# Patient Record
Sex: Male | Born: 1955 | Race: Black or African American | Hispanic: No | Smoking: Former smoker
Health system: Southern US, Community
[De-identification: ages and names within clinical notes are randomized; demographics above are authoritative.]

## PROBLEM LIST (undated history)

## (undated) DIAGNOSIS — I5189 Other ill-defined heart diseases: Secondary | ICD-10-CM

## (undated) DIAGNOSIS — I4892 Unspecified atrial flutter: Secondary | ICD-10-CM

## (undated) DIAGNOSIS — J449 Chronic obstructive pulmonary disease, unspecified: Secondary | ICD-10-CM

## (undated) DIAGNOSIS — F32A Depression, unspecified: Secondary | ICD-10-CM

## (undated) DIAGNOSIS — H5462 Unqualified visual loss, left eye, normal vision right eye: Secondary | ICD-10-CM

## (undated) DIAGNOSIS — K219 Gastro-esophageal reflux disease without esophagitis: Secondary | ICD-10-CM

## (undated) DIAGNOSIS — F329 Major depressive disorder, single episode, unspecified: Secondary | ICD-10-CM

## (undated) DIAGNOSIS — I2510 Atherosclerotic heart disease of native coronary artery without angina pectoris: Secondary | ICD-10-CM

## (undated) HISTORY — DX: Unqualified visual loss, left eye, normal vision right eye: H54.62

## (undated) HISTORY — DX: Depression, unspecified: F32.A

## (undated) HISTORY — DX: Major depressive disorder, single episode, unspecified: F32.9

## (undated) HISTORY — PX: EYE SURGERY: SHX253

## (undated) HISTORY — DX: Gastro-esophageal reflux disease without esophagitis: K21.9

---

## 2002-06-12 ENCOUNTER — Emergency Department (HOSPITAL_COMMUNITY): Admission: EM | Admit: 2002-06-12 | Discharge: 2002-06-12 | Payer: Self-pay | Admitting: Internal Medicine

## 2003-04-30 ENCOUNTER — Emergency Department (HOSPITAL_COMMUNITY): Admission: EM | Admit: 2003-04-30 | Discharge: 2003-05-01 | Payer: Self-pay | Admitting: *Deleted

## 2003-05-01 ENCOUNTER — Encounter: Payer: Self-pay | Admitting: *Deleted

## 2003-05-16 ENCOUNTER — Encounter: Payer: Self-pay | Admitting: Ophthalmology

## 2003-05-16 ENCOUNTER — Ambulatory Visit (HOSPITAL_COMMUNITY): Admission: RE | Admit: 2003-05-16 | Discharge: 2003-05-17 | Payer: Self-pay | Admitting: Ophthalmology

## 2003-09-05 ENCOUNTER — Emergency Department (HOSPITAL_COMMUNITY): Admission: EM | Admit: 2003-09-05 | Discharge: 2003-09-05 | Payer: Self-pay | Admitting: Emergency Medicine

## 2003-10-31 ENCOUNTER — Emergency Department (HOSPITAL_COMMUNITY): Admission: EM | Admit: 2003-10-31 | Discharge: 2003-10-31 | Payer: Self-pay | Admitting: Internal Medicine

## 2003-12-01 ENCOUNTER — Ambulatory Visit (HOSPITAL_COMMUNITY): Admission: RE | Admit: 2003-12-01 | Discharge: 2003-12-01 | Payer: Self-pay | Admitting: Family Medicine

## 2004-01-06 ENCOUNTER — Emergency Department (HOSPITAL_COMMUNITY): Admission: EM | Admit: 2004-01-06 | Discharge: 2004-01-06 | Payer: Self-pay | Admitting: Emergency Medicine

## 2004-01-23 ENCOUNTER — Emergency Department (HOSPITAL_COMMUNITY): Admission: EM | Admit: 2004-01-23 | Discharge: 2004-01-23 | Payer: Self-pay | Admitting: *Deleted

## 2005-04-04 ENCOUNTER — Ambulatory Visit: Payer: Self-pay | Admitting: Family Medicine

## 2008-01-10 ENCOUNTER — Emergency Department (HOSPITAL_COMMUNITY): Admission: EM | Admit: 2008-01-10 | Discharge: 2008-01-10 | Payer: Self-pay | Admitting: Emergency Medicine

## 2008-02-15 ENCOUNTER — Ambulatory Visit: Payer: Self-pay | Admitting: Family Medicine

## 2008-02-17 DIAGNOSIS — F411 Generalized anxiety disorder: Secondary | ICD-10-CM | POA: Insufficient documentation

## 2008-02-17 DIAGNOSIS — F419 Anxiety disorder, unspecified: Secondary | ICD-10-CM | POA: Insufficient documentation

## 2008-02-17 DIAGNOSIS — K219 Gastro-esophageal reflux disease without esophagitis: Secondary | ICD-10-CM | POA: Insufficient documentation

## 2008-02-17 DIAGNOSIS — R609 Edema, unspecified: Secondary | ICD-10-CM | POA: Insufficient documentation

## 2008-02-17 DIAGNOSIS — J45909 Unspecified asthma, uncomplicated: Secondary | ICD-10-CM | POA: Insufficient documentation

## 2008-02-17 DIAGNOSIS — N400 Enlarged prostate without lower urinary tract symptoms: Secondary | ICD-10-CM | POA: Insufficient documentation

## 2008-02-17 DIAGNOSIS — Z87898 Personal history of other specified conditions: Secondary | ICD-10-CM

## 2008-06-13 ENCOUNTER — Emergency Department (HOSPITAL_COMMUNITY): Admission: EM | Admit: 2008-06-13 | Discharge: 2008-06-13 | Payer: Self-pay | Admitting: Emergency Medicine

## 2008-06-13 ENCOUNTER — Encounter: Payer: Self-pay | Admitting: Family Medicine

## 2008-06-19 ENCOUNTER — Telehealth: Payer: Self-pay | Admitting: Family Medicine

## 2008-06-20 ENCOUNTER — Ambulatory Visit: Payer: Self-pay | Admitting: Family Medicine

## 2008-06-23 ENCOUNTER — Emergency Department (HOSPITAL_COMMUNITY): Admission: EM | Admit: 2008-06-23 | Discharge: 2008-06-23 | Payer: Self-pay | Admitting: Emergency Medicine

## 2008-07-04 ENCOUNTER — Emergency Department (HOSPITAL_COMMUNITY): Admission: EM | Admit: 2008-07-04 | Discharge: 2008-07-04 | Payer: Self-pay | Admitting: Emergency Medicine

## 2008-08-04 ENCOUNTER — Encounter: Payer: Self-pay | Admitting: Family Medicine

## 2008-08-25 ENCOUNTER — Emergency Department: Payer: Self-pay | Admitting: Emergency Medicine

## 2008-09-15 ENCOUNTER — Encounter: Payer: Self-pay | Admitting: Family Medicine

## 2008-09-22 ENCOUNTER — Telehealth: Payer: Self-pay | Admitting: Family Medicine

## 2008-10-04 ENCOUNTER — Ambulatory Visit: Payer: Self-pay | Admitting: Family Medicine

## 2008-10-04 DIAGNOSIS — M25579 Pain in unspecified ankle and joints of unspecified foot: Secondary | ICD-10-CM | POA: Insufficient documentation

## 2008-10-04 DIAGNOSIS — R5383 Other fatigue: Secondary | ICD-10-CM

## 2008-10-04 DIAGNOSIS — R5381 Other malaise: Secondary | ICD-10-CM | POA: Insufficient documentation

## 2008-10-20 ENCOUNTER — Encounter: Payer: Self-pay | Admitting: Family Medicine

## 2008-11-06 ENCOUNTER — Telehealth: Payer: Self-pay | Admitting: Family Medicine

## 2008-11-09 ENCOUNTER — Encounter: Payer: Self-pay | Admitting: Family Medicine

## 2008-11-09 ENCOUNTER — Encounter: Payer: Self-pay | Admitting: Orthopedic Surgery

## 2008-11-16 ENCOUNTER — Inpatient Hospital Stay: Payer: Self-pay | Admitting: Psychiatry

## 2008-11-29 ENCOUNTER — Encounter: Payer: Self-pay | Admitting: Family Medicine

## 2009-01-24 ENCOUNTER — Emergency Department (HOSPITAL_COMMUNITY): Admission: EM | Admit: 2009-01-24 | Discharge: 2009-01-24 | Payer: Self-pay | Admitting: Emergency Medicine

## 2009-02-01 ENCOUNTER — Emergency Department: Payer: Self-pay | Admitting: Emergency Medicine

## 2009-02-20 ENCOUNTER — Emergency Department: Payer: Self-pay | Admitting: Emergency Medicine

## 2009-02-21 ENCOUNTER — Emergency Department: Payer: Self-pay | Admitting: Emergency Medicine

## 2009-05-04 ENCOUNTER — Ambulatory Visit: Payer: Self-pay | Admitting: Family Medicine

## 2009-05-04 DIAGNOSIS — F528 Other sexual dysfunction not due to a substance or known physiological condition: Secondary | ICD-10-CM | POA: Insufficient documentation

## 2009-05-07 DIAGNOSIS — J209 Acute bronchitis, unspecified: Secondary | ICD-10-CM | POA: Insufficient documentation

## 2009-05-07 DIAGNOSIS — J019 Acute sinusitis, unspecified: Secondary | ICD-10-CM | POA: Insufficient documentation

## 2009-05-07 HISTORY — DX: Acute bronchitis, unspecified: J20.9

## 2009-05-15 ENCOUNTER — Inpatient Hospital Stay: Payer: Self-pay | Admitting: Internal Medicine

## 2009-05-15 ENCOUNTER — Encounter: Payer: Self-pay | Admitting: Family Medicine

## 2009-05-18 ENCOUNTER — Encounter: Payer: Self-pay | Admitting: Gastroenterology

## 2009-05-25 ENCOUNTER — Ambulatory Visit: Payer: Self-pay | Admitting: Gastroenterology

## 2009-05-25 ENCOUNTER — Ambulatory Visit (HOSPITAL_COMMUNITY): Admission: RE | Admit: 2009-05-25 | Discharge: 2009-05-25 | Payer: Self-pay | Admitting: Gastroenterology

## 2009-06-25 ENCOUNTER — Encounter: Payer: Self-pay | Admitting: Family Medicine

## 2009-09-04 ENCOUNTER — Encounter: Payer: Self-pay | Admitting: Family Medicine

## 2009-10-22 ENCOUNTER — Ambulatory Visit: Payer: Self-pay | Admitting: Family Medicine

## 2009-10-29 DIAGNOSIS — F17200 Nicotine dependence, unspecified, uncomplicated: Secondary | ICD-10-CM | POA: Insufficient documentation

## 2009-11-09 ENCOUNTER — Telehealth: Payer: Self-pay | Admitting: Family Medicine

## 2010-01-23 ENCOUNTER — Emergency Department: Payer: Self-pay | Admitting: Emergency Medicine

## 2010-05-01 ENCOUNTER — Ambulatory Visit: Payer: Self-pay | Admitting: Family Medicine

## 2010-08-05 ENCOUNTER — Telehealth: Payer: Self-pay | Admitting: Family Medicine

## 2010-09-20 ENCOUNTER — Encounter: Payer: Self-pay | Admitting: Family Medicine

## 2010-11-05 ENCOUNTER — Telehealth: Payer: Self-pay | Admitting: Family Medicine

## 2010-11-12 ENCOUNTER — Telehealth: Payer: Self-pay | Admitting: Family Medicine

## 2010-11-15 ENCOUNTER — Ambulatory Visit: Admit: 2010-11-15 | Payer: Self-pay | Admitting: Family Medicine

## 2010-11-21 ENCOUNTER — Ambulatory Visit
Admission: RE | Admit: 2010-11-21 | Discharge: 2010-11-21 | Payer: Self-pay | Source: Home / Self Care | Attending: Family Medicine | Admitting: Family Medicine
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
## 2010-12-03 NOTE — Letter (Signed)
Summary: MEDICAL RELEASE  MEDICAL RELEASE   Imported By: Lind Guest 08/04/2008 10:57:35  _____________________________________________________________________  External Attachment:    Type:   Image     Comment:   External Document

## 2010-12-03 NOTE — Medication Information (Signed)
Summary: Tax adviser   Imported By: Lind Guest 05/01/2010 14:06:26  _____________________________________________________________________  External Attachment:    Type:   Image     Comment:   External Document

## 2010-12-03 NOTE — Letter (Signed)
Summary: MEDICAL RECORDS RELEASE  MEDICAL RECORDS RELEASE   Imported By: Lind Guest 09/15/2008 10:00:10  _____________________________________________________________________  External Attachment:    Type:   Image     Comment:   External Document

## 2010-12-03 NOTE — Letter (Signed)
[facility] Referral No Show Appt 11/08/2008  [facility]  [address]  [place], [place] [address]   Phone: [phone]  Fax: [phone]    November 09, 2008  Dr. Albina Billet [facility] Attn:  Referrals [address] [place], [place]  [address] Ph [phone] / Fax [phone]   Re:    Bryan Kelley DOB:    07-Dec-1955    The above named patient was a no show for the scheduled appointment:  Date: 11/08/2008 Time: 2:00PM  Thank you for your kind referral.  Please feel free to contact our office if we can be of further service.  Sincerely,   [facility] of Dr. Terrance Mass, MD

## 2010-12-03 NOTE — Miscellaneous (Signed)
Summary: lab order  Clinical Lists Changes  Orders: Added new Test order of T-CBC w/Diff [id number]) - Signed Added new Test order of T- * Misc. Laboratory test [id number]) - Signed  Appended Document: lab order called patient, left message  Appended Document: lab order PATIENT AWARE

## 2010-12-03 NOTE — Progress Notes (Signed)
Summary: sister called  Phone Note Call from Patient   Summary of Call: rose campbell sister called and wants you to call her back said it was an emergency call back at [phone] Initial call taken by: Lind Guest,  November 09, 2009 7:52 AM  Follow-up for Phone Call        needs a letter.  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 mother wants to be power of attourney so she can make sure his bills are paid. states patient has really gone downhilll and this has made things very hard on his mother. mother nameDoristine Counter Umbaugh [phone] Follow-up by: Worthy Keeler LPN,  November 09, 2009 9:21 AM  Additional Follow-up for Phone Call Additional follow up Details #1::        pt will need to have either a psychiatrist statement  to say he is mentally incompetent and needs a POA or he himself has totrequest that his mom be made pOA. i understand that there are probs , but this is what needs to be done Additional Follow-up by: Syliva Overman MD,  November 09, 2009 1:39 PM    Additional Follow-up for Phone Call Additional follow up Details #2::    sister aware Follow-up by: Worthy Keeler LPN,  November 09, 2009 2:55 PM

## 2010-12-03 NOTE — Assessment & Plan Note (Signed)
Summary: follow up - room 2   Vital Signs:  Patient profile:   55 year old male Height:      68 inches Weight:      149 pounds BMI:     22.74 O2 Sat:      100 % on Room air Pulse rate:   55 / minute Resp:     16 per minute BP sitting:   110 / 70  (left arm)  Vitals Entered By: Adella Hare LPN (May 01, 2010 10:41 AM) CC: follow-up visit Is Patient Diabetic? No Pain Assessment Patient in pain? no      Comments did not bring meds to ov   Primary Provider:  Syliva Overman MD  CC:  follow-up visit.  History of Present Illness: Pt is here today for check up and medication refils. Pt has a hx of asthma.  He is using his Symbicort two times a day daily.  He also uses Proventil inhaler as needed.  He states his nebulizer machine recently broke. He has the liquid albuterol for breathing treatments but needs the machine.  He is using his albuterol two times a day.  He states this is less than he has in the past.  No HS awakening.  He is still smoking  Also hx of GERD.  Is taking his Omeprazole daily.  Denies heartburn.  BMs are nl.  Colonoscopy was done July 2010.  Hx of anxiety.  Is using Xanax three times a day as prescribed.  Hx of ED.  Has used Viagra in the past but is unable to afford it.   Allergies: No Known Drug Allergies  Past History:  Past medical history reviewed for relevance to current acute and chronic problems.  Past Medical History: Reviewed history from 06/20/2008 and no changes required. 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 ENT:  Denies earache, nasal congestion, sinus pressure, and sore throat. CV:  Denies chest pain or discomfort and palpitations. Resp:  Complains of cough, shortness of breath, and wheezing; denies sputum productive. GI:   Denies abdominal pain, bloody stools, change in bowel habits, dark tarry stools, indigestion, loss of appetite, nausea, and vomiting. GU:  Complains of erectile dysfunction. Psych:  Complains of anxiety and depression; denies suicidal thoughts/plans and thoughts /plans of harming others.  Physical Exam  General:  Well-developed,well-nourished,in no acute distress; alert,appropriate and cooperative throughout examination Head:  Normocephalic and atraumatic without obvious abnormalities. No apparent alopecia or balding. Ears:  External ear exam shows no significant lesions or deformities.  Otoscopic examination reveals clear canals, tympanic membranes are intact bilaterally without bulging, retraction, inflammation or discharge. Hearing is grossly normal bilaterally. Nose:  External nasal examination shows no deformity or inflammation. Nasal mucosa are pink and moist without lesions or exudates. Mouth:  Oral mucosa and oropharynx without lesions or exudates.  Neck:  No deformities, masses, or tenderness noted. Lungs:  normal respiratory effort and no intercostal retractions.  Wheeze noted bilat.  Pt refused nebulizer treatment in the office stating that his ride was waiting for him. Heart:  Normal rate and regular rhythm. S1 and S2 normal without gallop, murmur, click, rub or other extra sounds. Cervical Nodes:  No lymphadenopathy noted Psych:  Cognition and judgment appear intact. Alert and cooperative with normal attention span and concentration. No apparent delusions, illusions, hallucinations   Impression & Recommendations:  Problem # 1:  ASTHMA (ICD-493.90) Assessment Unchanged Rx written for new NMT machine.  His updated medication list for this problem includes:    Symbicort 80-4.5 Mcg/act Aero (Budesonide-formoterol fumarate) .Marland Kitchen..Marland Kitchen Two puffs two times a day    Proventil Hfa 108 (90 Base) Mcg/act Aers (Albuterol sulfate) .Marland Kitchen..Marland Kitchen Two puffs every six to eight hours as needed  Problem #  2:  ANXIETY (ICD-300.00) Assessment: Comment Only  His updated medication list for this problem includes:    Alprazolam 1 Mg Tabs (Alprazolam) ..... One tab by mouth three times a day  Problem # 3:  GERD (ICD-530.81) Assessment: Improved  His updated medication list for this problem includes:    Omeprazole 20 Mg Cpdr (Omeprazole) ..... One tab by mouth once daily  Problem # 4:  NICOTINE ADDICTION (ICD-305.1) Assessment: Unchanged  Encouraged smoking cessation but pt is not interested in quitting.  Problem # 5:  ERECTILE DYSFUNCTION, NON-ORGANIC (ICD-302.72) Assessment: Comment Only Pt requested samples due to expense.  Advised him that we do not have samples of this medication.  Complete Medication List: 1)  Omeprazole 20 Mg Cpdr (Omeprazole) .... One tab by mouth once daily 2)  Flomax 0.4 Mg Cp24 (Tamsulosin hcl) .... One tab by mouth once daily 3)  Symbicort 80-4.5 Mcg/act Aero (Budesonide-formoterol fumarate) .... Two puffs two times a day 4)  Proventil Hfa 108 (90 Base) Mcg/act Aers (Albuterol sulfate) .... Two puffs every six to eight hours as needed 5)  Alprazolam 1 Mg Tabs (Alprazolam) .... One tab by mouth three times a day  Patient Instructions: 1)  Please schedule a follow-up appointment in 4 months. 2)  Tobacco is very bad for your health and your loved ones! You Should stop smoking!. 3)  Stop Smoking Tips: Choose a Quit date. Cut down before the Quit date. decide what you will do as a substitute when you feel the urge to smoke(gum,toothpick,exercise). 4)  I have written you a prescription to get a new nebulizer machine. 5)  I have refilled your medications for you. Prescriptions: ALPRAZOLAM 1 MG  TABS (ALPRAZOLAM) one tab by mouth three times a day  #90 x 1   Entered and Authorized by:   Esperanza Sheets PA   Signed by:   Esperanza Sheets PA on 05/01/2010   Method used:   Printed then faxed to ...       [facility] (retail)       [address]        [place], [place]  [address]       Ph: [phone]       Fax: [phone]   RxID:   [id number] PROVENTIL HFA 108 (90 BASE) MCG/ACT  AERS (ALBUTEROL SULFATE) two puffs every six to eight hours as needed  #1 x 4   Entered and Authorized by:   Esperanza Sheets PA   Signed by:   Esperanza Sheets PA on 05/01/2010   Method used:   Electronically to        [facility] (retail)       [address]       [place], [place]  [address]       Ph: [phone]       Fax: [phone]   RxID:   [id number] SYMBICORT 80-4.5 MCG/ACT  AERO (BUDESONIDE-FORMOTEROL FUMARATE) two puffs two times a day  #1 x 5   Entered and Authorized by:  Esperanza Sheets PA   Signed by:   Esperanza Sheets PA on 05/01/2010   Method used:   Electronically to        [facility] (retail)       [address]       [place], [place]  [address]       Ph: [phone]       Fax: [phone]   RxID:   [id number] FLOMAX 0.4 MG  CP24 (TAMSULOSIN HCL) one tab by mouth once daily  #30 x 5   Entered and Authorized by:   Esperanza Sheets PA   Signed by:   Esperanza Sheets PA on 05/01/2010   Method used:   Electronically to        [facility] (retail)       [address]       [place], [place]  [address]       Ph: [phone]       Fax: [phone]   RxID:   [id number] OMEPRAZOLE 20 MG  CPDR (OMEPRAZOLE) one tab by mouth once daily  #30 x 5   Entered and Authorized by:   Esperanza Sheets PA   Signed by:   Esperanza Sheets PA on 05/01/2010   Method used:   Electronically to        [facility] (retail)       [address]       [place], [place]  [address]       Ph: [phone]       Fax: [phone]   RxID:   [id number]

## 2010-12-03 NOTE — Assessment & Plan Note (Signed)
Summary: FOLLOW UP   Vital Signs:  Patient profile:   55 year old male Height:      68 inches Weight:      157 pounds BMI:     23.96 O2 Sat:      97 % Pulse rate:   88 / minute Pulse rhythm:   regular Resp:     16 per minute BP sitting:   134 / 90 Cuff size:   large  Vitals Entered By: Everitt Amber (October 22, 2009 2:29 PM) CC: Follow up chronic problems, has been out of meds for almost 2 weeks   Primary Care Karmela Bram:  Syliva Overman MD  CC:  Follow up chronic problems and has been out of meds for almost 2 weeks.  History of Present Illness: pt is in with his sister today, and he is absolutely taken aback and angry, when he clearly states that her reason for accompanying him, is her concern about his persistent drug use. He refusees to consider rehab, states he can stop on his own , and that he is actually slowing down. At several poiunts during the visit he angrily stormed out of the exam room, demanding that i address him and not hissister. He denies any recent fever or chills. Reports having labs and a [facility] since his last visit.   Preventive Screening-Counseling & Management  Alcohol-Tobacco     Smoking Status: current     Smoking Cessation Counseling: yes  Current Medications (verified): 1)  Omeprazole 20 Mg  Cpdr (Omeprazole) .... One Tab By Mouth Once Daily 2)  Flomax 0.4 Mg  Cp24 (Tamsulosin Hcl) .... One Tab By Mouth Once Daily 3)  Symbicort 80-4.5 Mcg/act  Aero (Budesonide-Formoterol Fumarate) .... Two Puffs Two Times A Day 4)  Proventil Hfa 108 (90 Base) Mcg/act  Aers (Albuterol Sulfate) .... Two Puffs Every Six To Eight Hours As Needed 5)  Alprazolam 1 Mg  Tabs (Alprazolam) .... One Tab By Mouth Three Times A Day  Allergies (verified): No Known Drug Allergies  Social History: unemployed separated 4 children Alcohol use-no Drug use-yes Current Smoker  Review of Systems  See HPI General:  Complains of fatigue and weight loss; denies chills and fever. ENT:  Denies hoarseness, nasal congestion, and sinus pressure. CV:  Denies chest pain or discomfort, palpitations, and swelling of feet. Resp:  Denies cough and sputum productive. GI:  Denies abdominal pain, constipation, excessive appetite, nausea, and vomiting blood; reports UTD colonscopy, will send for rept. GU:  Complains of urinary hesitancy; denies dysuria and urinary frequency; responds to flomax. Derm:  Complains of itching; 2 week h/o painful lesions on tongie. Psych:  Complains of anxiety, depression, irritability, and mental problems; denies panic attacks, sense of great danger, suicidal thoughts/plans, thoughts of violence, and unusual visions or sounds; states in the past he has been on a mood stabiliser, but refuses one at this time. Heme:  Denies abnormal bruising and bleeding. Allergy:  Complains of seasonal allergies.  Physical Exam  General:  Well-developed,adequately nourished,in no acute distress;agitated, anxious and verbally abusive to his sibling at times during the exam. HEENT: No facial asymmetry,  EOMI, No sinus tenderness, TM's Clear, oropharynx  pink and moist.   Chest: decreased air entry bilterally CVS: S1, S2, No murmurs, No S3.   Abd: Soft, Nontender.  MS: Adequate ROM spine, hips, shoulders and knees.  Ext: No edema.   CNS: CN 2-12 intact, power tone and sensation  normal throughout.   Skin: Intact, no visible lesions or rashes.  Psych: Good eye contact, normal affect.  Memory intact,    Impression & Recommendations:  Problem # 1:  ERECTILE DYSFUNCTION, NON-ORGANIC (ICD-302.72) Assessment Unchanged  The following medications were removed from the medication list:    Viagra 100 Mg Tabs (Sildenafil citrate) .Marland Kitchen... Take 1 tablet by mouth once a day as needed  Problem # 2:  ASTHMA (ICD-493.90) Assessment: Unchanged  His updated medication list for this problem includes:     Symbicort 80-4.5 Mcg/act Aero (Budesonide-formoterol fumarate) .Marland Kitchen..Marland Kitchen Two puffs two times a day    Proventil Hfa 108 (90 Base) Mcg/act Aers (Albuterol sulfate) .Marland Kitchen..Marland Kitchen Two puffs every six to eight hours as needed  Problem # 3:  ANXIETY (ICD-300.00) Assessment: Deteriorated  His updated medication list for this problem includes:    Alprazolam 1 Mg Tabs (Alprazolam) ..... One tab by mouth three times a day  Problem # 4:  GERD (ICD-530.81) Assessment: Unchanged  His updated medication list for this problem includes:    Omeprazole 20 Mg Cpdr (Omeprazole) ..... One tab by mouth once daily  Problem # 5:  NICOTINE ADDICTION (ICD-305.1) Assessment: Deteriorated  Encouraged smoking cessation and discussed different methods for smoking cessation.   Complete Medication List: 1)  Omeprazole 20 Mg Cpdr (Omeprazole) .... One tab by mouth once daily 2)  Flomax 0.4 Mg Cp24 (Tamsulosin hcl) .... One tab by mouth once daily 3)  Symbicort 80-4.5 Mcg/act Aero (Budesonide-formoterol fumarate) .... Two puffs two times a day 4)  Proventil Hfa 108 (90 Base) Mcg/act Aers (Albuterol sulfate) .... Two puffs every six to eight hours as needed 5)  Alprazolam 1 Mg Tabs (Alprazolam) .... One tab by mouth three times a day 6)  Fluconazole 150 Mg Tabs (Fluconazole) .... Take 1 tablet by mouth once a day  Patient Instructions: 1)  Please schedule a follow-up appointment in 2 months. 2)  Tobacco is very bad for your health and your loved ones! You Should stop smoking!. 3)  Stop Smoking Tips: Choose a Quit date. Cut down before the Quit date. decide what you will do as a substitute when you feel the urge to smoke(gum,toothpick,exercise). Prescriptions: SYMBICORT 80-4.5 MCG/ACT  AERO (BUDESONIDE-FORMOTEROL FUMARATE) two puffs two times a day  #1 x 5   Entered by:   Everitt Amber   Authorized by:   Syliva Overman MD   Signed by:   Everitt Amber on 10/24/2009   Method used:   Electronically to        [facility] (retail)       [address]       [place], [place]  [address]       Ph: [phone]       Fax: [phone]   RxID:   [id number] FLUCONAZOLE 150 MG TABS (FLUCONAZOLE) Take 1 tablet by mouth once a day  #3 x 0   Entered and Authorized by:   Syliva Overman MD   Signed by:   Syliva Overman MD on 10/22/2009   Method used:   Electronically to        [facility] (retail)       [address]       [place], [place]  [address]       Ph: [phone]       Fax: [phone]   RxID:   [id number]  Appended Document: FOLLOW UP I had requested that you send to [facility] for his colonscopy rept and most recent hosp d/c suimmary. I dont see the reports or a request for them scanned, pls send for them  Appended Document: FOLLOW UP this was sent, but will resend

## 2010-12-03 NOTE — Letter (Signed)
Summary: MEDICAL RELEASE  MEDICAL RELEASE   Imported By: Lind Guest 10/20/2008 11:40:01  _____________________________________________________________________  External Attachment:    Type:   Image     Comment:   External Document

## 2010-12-03 NOTE — Progress Notes (Signed)
Summary: COPD DIAGNOSES  COPD DIAGNOSES   Imported By: Lind Guest 10/30/2009 16:38:54  _____________________________________________________________________  External Attachment:    Type:   Image     Comment:   External Document

## 2010-12-03 NOTE — Progress Notes (Signed)
Summary: papers  Phone Note Call from Patient   Summary of Call: waiting for papers to be sent to ss. wants to know what is taking so long [phone] Initial call taken by: Rudene Anda,  November 06, 2008 2:37 PM  Follow-up for Phone Call        once he signed for them smart should have sent them , pls check into this , and ensure they get sent this week, let me know if there is a prob, also let pt know what's going on Follow-up by: Syliva Overman MD,  November 06, 2008 4:04 PM  Additional Follow-up for Phone Call Additional follow up Details #1::        explained to patient about SS papers. And he was going to call and have them to send to Dr. Lodema Hong. Additional Follow-up by: Rudene Anda,  November 07, 2008 8:54 AM
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
## 2010-12-03 NOTE — Letter (Signed)
Summary: MEDICAL RELEASE  MEDICAL RELEASE   Imported By: Lind Guest 11/09/2008 14:34:04  _____________________________________________________________________  External Attachment:    Type:   Image     Comment:   External Document

## 2010-12-03 NOTE — Letter (Signed)
Summary: TCS ORDER  TCS ORDER   Imported By: Ave Filter 05/18/2009 10:36:57  _____________________________________________________________________  External Attachment:    Type:   Image     Comment:   External Document

## 2010-12-03 NOTE — Progress Notes (Signed)
  Phone Note Call from Patient   Summary of Call: Brought a paper in here Monday and gave it to [place], it was to send medical [facility] [place] by today.  [phone]  Follow-up for Phone Call        Lucio Edward talked to him and told him that it takes a few days to get the information to where it needs to go. It has been done in the computer Follow-up by: Everitt Amber,  September 22, 2008 4:33 PM

## 2010-12-03 NOTE — Letter (Signed)
Summary: 1st missed appt  1st missed appt   Imported By: Lind Guest 09/20/2010 14:54:40  _____________________________________________________________________  External Attachment:    Type:   Image     Comment:   External Document

## 2010-12-03 NOTE — Progress Notes (Signed)
Summary: MEDICINE  Phone Note Call from Patient   Summary of Call: NEEDS HIS XAN. SEND TO [facility]   IN [place]  # [phone] TO REACH HIN CALL AT [phone] Initial call taken by: Lind Guest,  August 05, 2010 8:25 AM  Follow-up for Phone Call        Rx Called In Follow-up by: Adella Hare LPN,  August 05, 2010 9:33 AM    Prescriptions: ALPRAZOLAM 1 MG  TABS (ALPRAZOLAM) one tab by mouth three times a day  #90 x 0   Entered by:   Adella Hare LPN   Authorized by:   Syliva Overman MD   Signed by:   Adella Hare LPN on 47/82/9562   Method used:   Printed then faxed to ...       [facility] (retail)       [address]       [place], [place]  [address]       Ph: [phone]       Fax: [phone]   RxID:   [id number] ALPRAZOLAM 1 MG  TABS (ALPRAZOLAM) one tab by mouth three times a day  #90 x 0   Entered and Authorized by:   Adella Hare LPN   Signed by:   Adella Hare LPN on 59/56/3875   Method used:   Printed then faxed to ...       [facility] (retail)       [address]       [place], [place]  [address]       Ph: [phone]       Fax: [phone]   RxID:   [id number]  voided

## 2010-12-03 NOTE — Consult Note (Signed)
Summary: DISABILITY DETERMINATION SERVICES  DISABILITY DETERMINATION SERVICES   Imported By: Lind Guest 06/28/2008 13:00:35  _____________________________________________________________________  External Attachment:    Type:   Image     Comment:   External Document

## 2010-12-03 NOTE — Progress Notes (Signed)
Summary: denied xanax refill  Phone Note Outgoing Call   Summary of Call: called patient to inform him that he needed an ov before his alprazolam could be refilled. number disconnected. Rx denied.elizabeth sergent  Initial call taken by: Arbutus Ped,  June 19, 2008 1:38 PM

## 2010-12-05 NOTE — Progress Notes (Signed)
Summary: MEDICATION  Phone Note Call from Patient   Caller: Patient Summary of Call:  PATIENT CAME IN THOUGHT HIS APPOINTMENT WAS TODAY BUT IT WAS FRIDAY. PATIENT CAME IN TO MOVE APPT FOR ANOTHER DAY,HE STATES HE NEEDS INHALER REFILL AND ZANAX  NURSE SENT REFILL ON INHALER.  BEFORE I SPOKE WITH THE NURSE PATIENT "STATES HE NEEDS THE INHALER BECAUSE HE WOULD HATE TO DIE AND HAVE [place] SUED BECAUSE HE NEEDED IT FOR HIS COPD.  PATIENT WAS VERY RUDE WANTED TO GET PRESRCIPTION FOR MEDICTION ADVISED HIM THAT ZANAX WILL NOT BE REFILLED UNTIL HE COME IN FOR APPT. ... HE SAID HE COULDN'T KEEP COMING HERE FROM [place] AND [place] NOT SEE HIM.  I EXPLAINED AGAIN THAT WE HAD HIM DOWN FOR THE 13TH AND THAT THERE WAS NO APPT. SPOTS AVAILABLE FOR TODAY. Initial call taken by: Eugenio Hoes,  November 12, 2010 11:25 AM  Follow-up for Phone Call        noted and agree Follow-up by: Syliva Overman MD,  November 12, 2010 5:03 PM

## 2010-12-05 NOTE — Progress Notes (Signed)
Summary: refill on meds  Phone Note Call from Patient   Summary of Call: patient thought that appointment was today advise patient that appoint is friday he states that he is out of medication could her get his asthma and zinic mediction until friday  Initial call taken by: Eugenio Hoes,  November 12, 2010 11:02 AM  Follow-up for Phone Call        [facility] AND SYMBICORT WAS SENT TO PHARMACY LAST WEEK, THERE WAS ENOUGH SENT TO LAST UNTIL SCHEDULED APPT, PATIENT IS AWARE Follow-up by: Adella Hare LPN,  November 12, 2010 11:27 AM

## 2010-12-05 NOTE — Progress Notes (Signed)
  Phone Note From Pharmacy   Caller: [facility] Summary of Call: requesting refills, however, patient has missed three appts and has not been seen since june, when patient requested meds last month pharmacy was told no further refills without appt. and no appt was made, med was filled today to last until appt that was scheduled by office manager for 11/15/09 at 10am which was first available, called patient and left message for him to return my call Initial call taken by: Adella Hare LPN,  November 05, 2010 4:02 PM

## 2010-12-08 DIAGNOSIS — B370 Candidal stomatitis: Secondary | ICD-10-CM | POA: Insufficient documentation

## 2010-12-19 NOTE — Assessment & Plan Note (Signed)
Summary: FOLLOW UP/SLJ   Vital Signs:  Patient profile:   55 year old male Height:      68 inches Weight:      153 pounds BMI:     23.35 O2 Sat:      95 % Pulse rate:   62 / minute Pulse rhythm:   regular Resp:     16 per minute BP sitting:   140 / 88  (left arm) Cuff size:   regular  Vitals Entered By: Everitt Amber LPN (November 21, 2010 10:54 AM) CC: Follow up chronic problems   Primary Care Provider:  Syliva Overman MD  CC:  Follow up chronic problems.  History of Present Illness: Reports  that he is doing fairly well. Denies recent fever or chills. Denies sinus pressure, nasal congestion , ear pain or sore throat. Denies chest congestion, or cough productive of sputum.Has been wheezing recently. Denies chest pain, palpitations, PND, orthopnea or leg swelling. Denies abdominal pain, nausea, vomitting, diarrhea or constipation. Denies change in bowel movements or bloody stool. Denies dysuria , frequency, incontinence or hesitancy. Denies  joint pain, swelling, or reduced mobility. Denies headaches, vertigo, seizures. Denies depression,has chronic  anxiety with mild  insomnia. Denies  rash, lesions, or itch.     Preventive Screening-Counseling & Management  Alcohol-Tobacco     Smoking Cessation Counseling: yes  Current Medications (verified): 1)  Omeprazole 20 Mg  Cpdr (Omeprazole) .... One Tab By Mouth Once Daily 2)  Flomax 0.4 Mg  Cp24 (Tamsulosin Hcl) .... One Tab By Mouth Once Daily 3)  Symbicort 80-4.5 Mcg/act  Aero (Budesonide-Formoterol Fumarate) .... Two Puffs Two Times A Day 4)  Alprazolam 1 Mg  Tabs (Alprazolam) .... One Tab By Mouth Three Times A Day  Allergies (verified): No Known Drug Allergies  Review of Systems      See HPI General:  Complains of fatigue. Eyes:  Complains of vision loss-both eyes; denies discharge. Derm:  Complains of lesion(s); oral thrush. Endo:  Denies cold intolerance, excessive hunger, excessive thirst, excessive  urination, and heat intolerance. Heme:  Denies abnormal bruising and bleeding. Allergy:  Denies hives or rash and itching eyes.  Physical Exam  General:  Well-developed,well-nourished,in no acute distress; alert,appropriate and cooperative throughout examination HEENT: No facial asymmetry,  EOMI, No sinus tenderness, TM's Clear, oropharynx  pink and moist.candidiasis of the tongue  Chest: Clear to auscultation bilaterally. decreased air entry bilaterally CVS: S1, S2, No murmurs, No S3.   Abd: Soft, Nontender.  MS: Adequate ROM spine, hips, shoulders and knees.  Ext: No edema.   CNS: CN 2-12 intact, power tone and sensation normal throughout.   Skin: Intact, no visible lesions or rashes.  Psych: Good eye contact, normal affect.  Memory intact, not anxious or depressed appearing.    Impression & Recommendations:  Problem # 1:  NICOTINE ADDICTION (ICD-305.1) Assessment Unchanged  Encouraged smoking cessation and discussed different methods for smoking cessation.   Problem # 2:  ANXIETY (ICD-300.00) Assessment: Unchanged  His updated medication list for this problem includes:    Alprazolam 1 Mg Tabs (Alprazolam) ..... One tab by mouth three times a day  Problem # 3:  ASTHMA (ICD-493.90) Assessment: Unchanged  The following medications were removed from the medication list:    Proventil Hfa 108 (90 Base) Mcg/act Aers (Albuterol sulfate) .Marland Kitchen..Marland Kitchen Two puffs every six to eight hours as needed His updated medication list for this problem includes:    Symbicort 80-4.5 Mcg/act Aero (Budesonide-formoterol fumarate) .Marland Kitchen..Marland Kitchen Two puffs two  times a day    Proventil Hfa 108 (90 Base) Mcg/act Aers (Albuterol sulfate) .Marland Kitchen... 2 puffs every 6 to 8 hours as needed for wheezing  Problem # 4:  GERD (ICD-530.81) Assessment: Unchanged  His updated medication list for this problem includes:    Omeprazole 20 Mg Cpdr (Omeprazole) ..... One tab by mouth once daily  Problem # 5:  CANDIDIASIS OF MOUTH  (ICD-112.0) Assessment: Comment Only dukes mouthwash  Complete Medication List: 1)  Omeprazole 20 Mg Cpdr (Omeprazole) .... One tab by mouth once daily 2)  Symbicort 80-4.5 Mcg/act Aero (Budesonide-formoterol fumarate) .... Two puffs two times a day 3)  Alprazolam 1 Mg Tabs (Alprazolam) .... One tab by mouth three times a day 4)  Proventil Hfa 108 (90 Base) Mcg/act Aers (Albuterol sulfate) .... 2 puffs every 6 to 8 hours as needed for wheezing 5)  First-dukes Mouthwash Susp (Diphenhyd-hydrocort-nystatin) .... One teaspoon 3 times daily gargle swish and swallow  Other Orders: T-Basic Metabolic Panel [phone]) T-Lipid Profile [id number]) T-CBC w/Diff 450 [id number]) T-PSA [id number]) T-TSH [phone])  Patient Instructions: 1)  Please schedule a follow-up appointment in 4 months. 2)  Tobacco is very bad for your health and your loved ones! You Should stop smoking!. 3)  Stop Smoking Tips: Choose a Quit date. Cut down before the Quit date. decide what you will do as a substitute when you feel the urge to smoke(gum,toothpick,exercise). 4)  It is not healthy  for men to drink more than 2-3 drinks per day or for women to drink more than 1-2 drinks per day. 5)  BMP prior to visit, ICD-9: 6)  Lipid Panel prior to visit, ICD-9:   fasting in 4 months 7)  CBC w/ Diff prior to visit, ICD-9: 8)  PSA prior to visit, ICD-9: 9)  tsh 10)  med sent in for thrush Prescriptions: SYMBICORT 80-4.5 MCG/ACT  AERO (BUDESONIDE-FORMOTEROL FUMARATE) two puffs two times a day  #1 x 1   Entered by:   Everitt Amber LPN   Authorized by:   Syliva Overman MD   Signed by:   Everitt Amber LPN on 56/38/7564   Method used:   Electronically to        [facility] (retail)       [address]       [place], [place]  [address]       Ph: [phone]       Fax: [phone]   RxID:   [id number] ALPRAZOLAM 1 MG  TABS (ALPRAZOLAM) one tab by mouth three times a day  #90 x  3   Entered by:   Adella Hare LPN   Authorized by:   Syliva Overman MD   Signed by:   Adella Hare LPN on 76/28/3151   Method used:   Printed then faxed to ...       [facility] (retail)       [address]       [place], [place]  [address]       Ph: [phone]       Fax: [phone]   RxID:   [id number] FIRST-DUKES MOUTHWASH  SUSP (DIPHENHYD-HYDROCORT-NYSTATIN) one teaspoon 3 times daily gargle swish and swallow  #150cc x 0   Entered and Authorized by:   Syliva Overman MD   Signed by:   Syliva Overman MD on 11/21/2010   Method used:   Electronically to  [facility] (retail)       [address]       [place], [place]  [address]       Ph: [phone]       Fax: [phone]   RxID:   [id number] PROVENTIL HFA 108 ([phone] BASE) MCG/ACT AERS (ALBUTEROL SULFATE) 2 puffs every 6 to 8 hours as needed for wheezing  #1 x 3   Entered and Authorized by:   Syliva Overman MD   Signed by:   Syliva Overman MD on 11/21/2010   Method used:   Electronically to        [facility] (retail)       [address]       [place], [place]  [address]       Ph: [phone]       Fax: [phone]   RxID:   [id number]    Orders Added: 1)  Est. Patient Level IV [93235] 2)  T-Basic Metabolic Panel [[id number]] 3)  T-Lipid Profile [[id number]] 4)  T-CBC w/Diff [[id number]] 5)  T-PSA [[id number]] 6)  T-TSH [[id number]]

## 2011-02-13 LAB — DIFFERENTIAL
Basophils Absolute: 0.1 10*3/uL (ref 0.0–0.1)
Basophils Relative: 1 % (ref 0–1)
Eosinophils Absolute: 0.8 10*3/uL — ABNORMAL HIGH (ref 0.0–0.7)
Eosinophils Relative: 12 % — ABNORMAL HIGH (ref 0–5)
Lymphocytes Relative: 39 % (ref 12–46)
Lymphs Abs: 2.7 10*3/uL (ref 0.7–4.0)
Monocytes Absolute: 0.4 10*3/uL (ref 0.1–1.0)
Monocytes Relative: 6 % (ref 3–12)
Neutro Abs: 2.9 10*3/uL (ref 1.7–7.7)
Neutrophils Relative %: 42 % — ABNORMAL LOW (ref 43–77)

## 2011-02-13 LAB — POCT I-STAT, CHEM 8
BUN: 13 mg/dL (ref 6–23)
Calcium, Ion: 1.16 mmol/L (ref 1.12–1.32)
Chloride: 106 mEq/L (ref 96–112)
Creatinine, Ser: 1.2 mg/dL (ref 0.4–1.5)
Glucose, Bld: 99 mg/dL (ref 70–99)
HCT: 46 % (ref 39.0–52.0)
Hemoglobin: 15.6 g/dL (ref 13.0–17.0)
Potassium: 3.8 mEq/L (ref 3.5–5.1)
Sodium: 141 mEq/L (ref 135–145)
TCO2: 26 mmol/L (ref 0–100)

## 2011-02-13 LAB — CBC
HCT: 43.8 % (ref 39.0–52.0)
Hemoglobin: 14.8 g/dL (ref 13.0–17.0)
MCHC: 33.7 g/dL (ref 30.0–36.0)
MCV: 91.5 fL (ref 78.0–100.0)
Platelets: 179 10*3/uL (ref 150–400)
RBC: 4.79 MIL/uL (ref 4.22–5.81)
RDW: 13.4 % (ref 11.5–15.5)
WBC: 6.8 10*3/uL (ref 4.0–10.5)

## 2011-03-18 NOTE — Op Note (Signed)
NAME:  Bryan Kelley, Bryan Kelley                 ACCOUNT NO.:  [id number]   MEDICAL RECORD NO.:  [id number]          PATIENT TYPE:  AMB   LOCATION:  DAY                           FACILITY:  [facility]   PHYSICIAN:  Kassie Mends, M.D.      DATE OF BIRTH:  [birth year]   DATE OF PROCEDURE:  05/25/2009  DATE OF DISCHARGE:                               OPERATIVE REPORT   REFERRING PHYSICIAN:  Dr. Lodema Hong.   PROCEDURE:  Colonoscopy.   INDICATION FOR EXAM:  Bryan Kelley is a 55 year old male who presents for  average risk colon cancer screening.   FINDINGS:  Normal colon without evidence of polyps, masses, inflammatory  changes, or diverticular AVMs.  Very small internal hemorrhoids.   RECOMMENDATIONS:  1. Screening colonoscopy in 10 years.  2. He was given a handout on high-fiber diet and hemorrhoids.   MEDICATIONS:  1. Demerol 100 mg IV.  2. Versed 5 mg IV.   PROCEDURE TECHNIQUE:  Physical exam was performed.  Informed consent was  obtained from the patient explaining benefits, risks, and alternatives  to the procedure.  The patient was connected to monitor and placed in  left lateral position.  Continuous oxygen was provided by nasal cannula  and IV medicine administered through an indwelling cannula.  After  administration of sedation and rectal exam, the patient's rectum was  intubated and the scope was advanced under direct visualization to the  cecum.  Scope was removed slowly by careful examining the color,  texture, anatomy, and integrity mucosa on the way out.  The patient was  recovered in endoscopy and discharged home in satisfactory condition.      Kassie Mends, M.D.  Electronically Signed     SM/MEDQ  D:  05/25/2009  T:  05/25/2009  Job:  [id number]   cc:   Milus Mallick. Lodema Hong, M.D.  Fax: [phone]

## 2011-03-21 NOTE — Op Note (Signed)
NAME:  Bryan Kelley, Bryan Kelley                             ACCOUNT NO.:  [id number]   MEDICAL RECORD NO.:  [id number]                   PATIENT TYPE:  OIB   LOCATION:  5731                                 FACILITY:  [facility]   PHYSICIAN:  Beulah Gandy. Ashley Royalty, M.D.              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   PROCEDURE:  Pars plana vitrectomy left eye, scleral buckle left eye,  gas/fluid exchange left eye, retinal photocoagulation left eye.   SURGEON:  Beulah Gandy. Ashley Royalty, M.D.   ASSISTANT:  Bryan Lemma. Lundquist, P.A. and Merian Capron, M.A.   ANESTHESIA:  General.   DESCRIPTION OF PROCEDURE:  Usual prep and drape.  360 degree limbal  peritomy. Isolation of four rectus muscles on 2-0 silk.  Localization of  break in the lower temporal quadrant.  Scleral dissection for 360 degrees to  admit a #279 intrascleral implant.  Diathermy placed in the bed.  The 279  implant was placed and eight scleral sutures were placed in the scleral  flaps to close over the implant.  The 240 band was placed around the eye  with a 270 sleeve at 10 o'clock.  The perforation site was chosen at 2  o'clock in the posterior aspect of the bed.  A large amount of clear,  colorless, subretinal fluid came forth with the perforation.  Once all of  the fluid was finished, the buckle was placed and the scleral flaps were  closed.  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  The attention was carried to the Pars  plana area where the 6 mm infusion port was anchored into place at 4  o'clock. The lighted pick and the cutter was placed at 10 and 2 o'clock,  respectively.  Amvisc was placed on the corneal surface.  The Biome viewing  system was moved into place.  The Pars plana vitrectomy was begun in the  pupillary axis with vitreous membranes being removed from this area. The  vitrectomy was carried down to the macular surface and the disk surface and  then out to the far periphery where the vitreous base was trimmed for 360  degrees.  Perfluoron was then injected over the optic nerve to reattach the  retina.  Once the vitreous cavity contained total Perfluoron, the  instruments were removed from the eye and the indirect ophthalmoscope laser  was moved into place.  294 burns were placed on the scleral buckle around  the retinal periphery with a power of 600 milliwatts 1000 microns each and  0.1 seconds each.  Once this was accomplished, the Perfluoron was exchanged  for Perfluoropropane 12%.  This was a direct exchange. The instruments were  removed from the eye and  9-0 nylon was used to close the sclerotomy sites.  The buckle was adjusted and trimmed.  The band was adjusted and trimmed. The  sutures were knotted and trimmed. The conjunctiva was reposited with 7-0  chromic suture.  Polymixin and gentamicin were irrigated into tenon's space.  Atropine solution was applied.  Marcaine was injected around the globe for  postoperative pain.  Decadron 10 mg was injected into the lower  subconjunctival space.  The closing tension was 10 with a [place]  tonometer.  Complications were none.  Duration was two hours. The patient  was awakened and taken to the recovery room in satisfactory condition.                                               Beulah Gandy. Ashley Royalty, M.D.   JDM/MEDQ  D:  05/16/2003  T:  05/16/2003  Job:  [id number]

## 2011-03-27 ENCOUNTER — Other Ambulatory Visit: Payer: Self-pay

## 2011-03-27 MED ORDER — ALPRAZOLAM 1 MG PO TABS: 1.0000 mg | ORAL_TABLET | Freq: Three times a day (TID) | ORAL | Status: AC | PRN

## 2011-04-01 ENCOUNTER — Ambulatory Visit: Payer: Self-pay | Admitting: Family Medicine

## 2011-05-28 ENCOUNTER — Ambulatory Visit: Payer: Self-pay | Admitting: Family Medicine

## 2011-05-28 ENCOUNTER — Encounter: Payer: Self-pay | Admitting: Family Medicine

## 2011-07-09 ENCOUNTER — Ambulatory Visit: Payer: Self-pay | Admitting: Family Medicine

## 2011-07-09 ENCOUNTER — Encounter: Payer: Self-pay | Admitting: Family Medicine

## 2011-07-09 ENCOUNTER — Telehealth: Payer: Self-pay | Admitting: Family Medicine
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
## 2011-07-09 NOTE — Telephone Encounter (Signed)
Patient has not been seen since [place]

## 2011-07-09 NOTE — Telephone Encounter (Signed)
Called patient, no option to leave message Need to know what med needs to be filled

## 2011-07-10 NOTE — Telephone Encounter (Signed)
Called patient, left message.

## 2011-07-25 ENCOUNTER — Encounter: Payer: Self-pay | Admitting: Family Medicine

## 2011-07-28 ENCOUNTER — Other Ambulatory Visit: Payer: Self-pay

## 2011-07-28 ENCOUNTER — Ambulatory Visit (INDEPENDENT_AMBULATORY_CARE_PROVIDER_SITE_OTHER): Payer: Medicaid Other | Admitting: Family Medicine

## 2011-07-28 ENCOUNTER — Encounter: Payer: Self-pay | Admitting: Family Medicine

## 2011-07-28 VITALS — BP 140/90 | HR 70 | Resp 16 | Ht 67.0 in | Wt 166.1 lb

## 2011-07-28 DIAGNOSIS — F528 Other sexual dysfunction not due to a substance or known physiological condition: Secondary | ICD-10-CM

## 2011-07-28 DIAGNOSIS — R5383 Other fatigue: Secondary | ICD-10-CM

## 2011-07-28 DIAGNOSIS — Z125 Encounter for screening for malignant neoplasm of prostate: Secondary | ICD-10-CM

## 2011-07-28 DIAGNOSIS — Z13220 Encounter for screening for lipoid disorders: Secondary | ICD-10-CM

## 2011-07-28 DIAGNOSIS — F17200 Nicotine dependence, unspecified, uncomplicated: Secondary | ICD-10-CM

## 2011-07-28 DIAGNOSIS — F419 Anxiety disorder, unspecified: Secondary | ICD-10-CM

## 2011-07-28 DIAGNOSIS — R5381 Other malaise: Secondary | ICD-10-CM

## 2011-07-28 DIAGNOSIS — J45909 Unspecified asthma, uncomplicated: Secondary | ICD-10-CM

## 2011-07-28 DIAGNOSIS — F411 Generalized anxiety disorder: Secondary | ICD-10-CM

## 2011-07-28 DIAGNOSIS — K219 Gastro-esophageal reflux disease without esophagitis: Secondary | ICD-10-CM

## 2011-07-28 DIAGNOSIS — N529 Male erectile dysfunction, unspecified: Secondary | ICD-10-CM | POA: Insufficient documentation

## 2011-07-28 LAB — CBC WITH DIFFERENTIAL/PLATELET
Basophils Absolute: 0 10*3/uL (ref 0.0–0.1)
Basophils Relative: 0 % (ref 0–1)
Eosinophils Absolute: 0.3 10*3/uL (ref 0.0–0.7)
Eosinophils Relative: 4 % (ref 0–5)
HCT: 44.5 % (ref 39.0–52.0)
Hemoglobin: 14.9 g/dL (ref 13.0–17.0)
Lymphocytes Relative: 43 % (ref 12–46)
Lymphs Abs: 3.3 10*3/uL (ref 0.7–4.0)
MCH: 29.9 pg (ref 26.0–34.0)
MCHC: 33.5 g/dL (ref 30.0–36.0)
MCV: 89.4 fL (ref 78.0–100.0)
Monocytes Absolute: 0.5 10*3/uL (ref 0.1–1.0)
Monocytes Relative: 6 % (ref 3–12)
Neutro Abs: 3.5 10*3/uL (ref 1.7–7.7)
Neutrophils Relative %: 47 % (ref 43–77)
Platelets: 264 10*3/uL (ref 150–400)
RBC: 4.98 MIL/uL (ref 4.22–5.81)
RDW: 14 % (ref 11.5–15.5)
WBC: 7.6 10*3/uL (ref 4.0–10.5)

## 2011-07-28 LAB — BASIC METABOLIC PANEL
BUN: 18 mg/dL (ref 6–23)
CO2: 26 mEq/L (ref 19–32)
Calcium: 9.9 mg/dL (ref 8.4–10.5)
Chloride: 103 mEq/L (ref 96–112)
Creat: 1.03 mg/dL (ref 0.50–1.35)
Glucose, Bld: 92 mg/dL (ref 70–99)
Potassium: 4.4 mEq/L (ref 3.5–5.3)
Sodium: 140 mEq/L (ref 135–145)

## 2011-07-28 LAB — PSA: PSA: 0.71 ng/mL (ref <=4.00)

## 2011-07-28 LAB — STREP A DNA PROBE: Group A Strep Probe: NEGATIVE

## 2011-07-28 LAB — LIPID PANEL
Cholesterol: 151 mg/dL (ref 0–200)
HDL: 51 mg/dL (ref >39)
LDL Cholesterol: 89 mg/dL (ref 0–99)
Total CHOL/HDL Ratio: 3 Ratio
Triglycerides: 55 mg/dL (ref <150)
VLDL: 11 mg/dL (ref 0–40)

## 2011-07-28 LAB — TSH: TSH: 1.172 u[IU]/mL (ref 0.350–4.500)

## 2011-07-28 LAB — RAPID STREP SCREEN (MED CTR MEBANE ONLY): Streptococcus, Group A Screen (Direct): NEGATIVE

## 2011-07-28 MED ORDER — ALBUTEROL 90 MCG/ACT IN AERS: 2.0000 | INHALATION_SPRAY | Freq: Four times a day (QID) | RESPIRATORY_TRACT | Status: DC | PRN

## 2011-07-28 MED ORDER — PREDNISONE 5 MG PO KIT: 5.0000 mg | PACK | ORAL | Status: DC

## 2011-07-28 MED ORDER — TADALAFIL 20 MG PO TABS: 20.0000 mg | ORAL_TABLET | Freq: Every day | ORAL | Status: AC | PRN

## 2011-07-28 MED ORDER — ALBUTEROL SULFATE HFA 108 (90 BASE) MCG/ACT IN AERS: 2.0000 | INHALATION_SPRAY | Freq: Four times a day (QID) | RESPIRATORY_TRACT | Status: DC | PRN

## 2011-07-28 MED ORDER — ALPRAZOLAM 1 MG PO TABS: 1.0000 mg | ORAL_TABLET | Freq: Three times a day (TID) | ORAL | Status: DC | PRN

## 2011-07-28 MED ORDER — IPRATROPIUM-ALBUTEROL 0.5-2.5 (3) MG/3ML IN SOLN: 3.0000 mL | Freq: Three times a day (TID) | RESPIRATORY_TRACT | Status: DC | PRN

## 2011-07-28 MED ORDER — ALBUTEROL SULFATE (2.5 MG/3ML) 0.083% IN NEBU
2.5000 mg | INHALATION_SOLUTION | Freq: Once | RESPIRATORY_TRACT | Status: AC
  Administered 2011-07-28: 2.5 mg via RESPIRATORY_TRACT

## 2011-07-28 MED ORDER — IPRATROPIUM BROMIDE 0.02 % IN SOLN
0.5000 mg | Freq: Once | RESPIRATORY_TRACT | Status: AC
  Administered 2011-07-28: 0.5 mg via RESPIRATORY_TRACT

## 2011-07-28 MED ORDER — METHYLPREDNISOLONE ACETATE 80 MG/ML IJ SUSP
80.0000 mg | Freq: Once | INTRAMUSCULAR | Status: AC
  Administered 2011-07-28: 80 mg via INTRAMUSCULAR

## 2011-07-28 NOTE — Assessment & Plan Note (Signed)
Controlled, no change in medication  

## 2011-07-28 NOTE — Assessment & Plan Note (Signed)
Changed to cialis in the hope that it is more cost effective than viagra. Reports difficulty with attaining and maintaining erections

## 2011-07-28 NOTE — Assessment & Plan Note (Addendum)
Uncontrolled, depo medrol and neb treatment in office, prednisone dose pack and inhalers prescribed

## 2011-07-28 NOTE — Assessment & Plan Note (Signed)
Continue med as before, controlled

## 2011-07-28 NOTE — Progress Notes (Signed)
  Subjective:    Patient ID: Bryan Kelley, male    DOB: 08/28/1956, 55 y.o.   MRN: [id number]  HPI Pt in for f/u chronic probl;ems. He states he is drug free for the past 6 months and intends to stay that way, motivation was when people started telling him he "looked badly" Still smokes cigarettes, but less, half pack per day, quit date Dec25, 2012! Reports increased shortness of breath and wheezing, requests new neb machine, and supplies. Denies fever, chills or productive cough. Refuses flu or pneumonia vaccines   Review of Systems See HPI Denies recent fever or chills. Denies sinus pressure, nasal congestion, ear pain or sore throat. Denies chest congestion or  productive cough , notes increased  Wheezing in the past 1 to 2 weeks Denies chest pains, palpitations and leg swelling Denies abdominal pain, nausea, vomiting,diarrhea or constipation.   Denies dysuria, frequency, hesitancy or incontinence. Denies joint pain, swelling and limitation in mobility. Denies headaches, seizures, numbness, or tingling. Denies uncontrolled  depression, anxiety or insomnia. Denies skin break down or rash.        Objective:   Physical Exam Patient alert and oriented and in no cardiopulmonary distress.  HEENT: No facial asymmetry, EOMI, no sinus tenderness,  oropharynx pink and moist.  Neck supple no adenopathy.  Chest: Decreased air entry throughout, wheezes bilaterally, no crackles CVS: S1, S2 no murmurs, no S3.  ABD: Soft non tender. Bowel sounds normal.  Ext: No edema  MS: Adequate ROM spine, shoulders, hips and knees.  Skin: Intact, no ulcerations or rash noted.  Psych: Good eye contact, normal affect. Memory intact not anxious or depressed appearing.  CNS: CN 2-12 intact, power, tone and sensation normal throughout.        Assessment & Plan:

## 2011-07-28 NOTE — Assessment & Plan Note (Signed)
Smoking less than in the past, 10/day, plans to quit in December

## 2011-07-28 NOTE — Patient Instructions (Addendum)
F/U in mid January.   Please think about quitting smoking.  This is very important for your health.  Consider setting a quit date, then cutting back or switching brands to prepare to stop.  Also think of the money you will save every day by not smoking.  Quick Tips to Quit Smoking: Fix a date i.e. keep a date in mind from when you would not touch a tobacco product to smoke  Keep yourself busy and block your mind with work loads or reading books or watching movies in malls where smoking is not allowed  Vanish off the things which reminds you about smoking for example match box, or your favorite lighter, or the pipe you used for smoking, or your favorite jeans and shirt with which you used to enjoy smoking, or the club where you used to do smoking  Try to avoid certain people places and incidences where and with whom smoking is a common factor to add on  Praise yourself with some token gifts from the money you saved by stopping smoking  Anti Smoking teams are there to help you. Join their programs  Anti-smoking Gums are there in many medical shops. Try them to quit smoking   Side-effects of Smoking: Disease caused by smoking cigarettes are emphysema, bronchitis, heart failures  Premature death  Cancer is the major side effect of smoking  Heart attacks and strokes are the quick effects of smoking causing sudden death  Some smokers lives end up with limbs amputated  Breathing problem or fast breathing is another side effect of smoking  Due to more intakes of smokes, carbon mono-oxide goes into your brain and other muscles of the body which leads to swelling of the veins and blockage to the air passage to lungs  Carbon monoxide blocks blood vessels which leads to blockage in the flow of blood to different major body organs like heart lungs and thus leads to attacks and deaths  During pregnancy smoking is very harmful and leads to premature birth of the infant, spontaneous abortions, low weight of  the infant during birth  Fat depositions to narrow and blocked blood vessels causing heart attacks  In many cases cigarette smoking caused infertility in men    Labs today.  Meds will be renewed for 4 months  Pls stay drug free  You neeed flu and pneumonia vaccines.  New med for ED, cialis  Neb treatment and depo medrol today for asthma

## 2011-08-04 ENCOUNTER — Telehealth: Payer: Self-pay | Admitting: Family Medicine

## 2011-08-04 NOTE — Telephone Encounter (Signed)
Can you write a script for a neb machine for this patient?

## 2011-08-04 NOTE — Telephone Encounter (Signed)
rx written, pls fax in and notify pt, in your area

## 2011-09-01 ENCOUNTER — Other Ambulatory Visit: Payer: Self-pay | Admitting: *Deleted

## 2011-09-01 DIAGNOSIS — J45909 Unspecified asthma, uncomplicated: Secondary | ICD-10-CM

## 2011-09-01 MED ORDER — IPRATROPIUM-ALBUTEROL 0.5-2.5 (3) MG/3ML IN SOLN: 3.0000 mL | Freq: Three times a day (TID) | RESPIRATORY_TRACT | Status: DC | PRN

## 2011-09-02 ENCOUNTER — Telehealth: Payer: Self-pay | Admitting: Family Medicine

## 2011-09-04 NOTE — Telephone Encounter (Signed)
Patient received Depo Medrol (steroid) Called patient, unavailable

## 2011-09-17 NOTE — Telephone Encounter (Signed)
Patient aware.

## 2011-09-24 ENCOUNTER — Telehealth: Payer: Self-pay | Admitting: Family Medicine

## 2011-09-24 MED ORDER — OMEPRAZOLE 20 MG PO CPDR: 20.0000 mg | DELAYED_RELEASE_CAPSULE | Freq: Every day | ORAL | Status: DC

## 2011-09-24 MED ORDER — ALPRAZOLAM 1 MG PO TABS: 1.0000 mg | ORAL_TABLET | Freq: Three times a day (TID) | ORAL | Status: DC | PRN

## 2011-09-24 NOTE — Telephone Encounter (Signed)
Omeprazole sent in and xanax called into pharmacist

## 2011-10-21 ENCOUNTER — Other Ambulatory Visit: Payer: Self-pay

## 2011-10-21 MED ORDER — ALPRAZOLAM 1 MG PO TABS: 1.0000 mg | ORAL_TABLET | Freq: Three times a day (TID) | ORAL | Status: DC | PRN

## 2011-11-24 ENCOUNTER — Telehealth: Payer: Self-pay | Admitting: Family Medicine

## 2011-11-24 NOTE — Telephone Encounter (Signed)
Left message to return call 

## 2011-11-27 ENCOUNTER — Ambulatory Visit: Payer: Medicaid Other | Admitting: Family Medicine

## 2011-12-01 ENCOUNTER — Telehealth: Payer: Self-pay | Admitting: Family Medicine

## 2011-12-02 ENCOUNTER — Other Ambulatory Visit: Payer: Self-pay | Admitting: Family Medicine

## 2011-12-02 MED ORDER — PROMETHAZINE-DM 6.25-15 MG/5ML PO SYRP: ORAL_SOLUTION | ORAL | Status: AC

## 2011-12-02 NOTE — Telephone Encounter (Signed)
Pt aware.

## 2011-12-02 NOTE — Telephone Encounter (Signed)
Med sent to his pharmacy, pls let him know

## 2011-12-02 NOTE — Telephone Encounter (Signed)
Spoke with pt

## 2011-12-03 ENCOUNTER — Ambulatory Visit (INDEPENDENT_AMBULATORY_CARE_PROVIDER_SITE_OTHER): Payer: Medicaid Other | Admitting: Family Medicine

## 2011-12-03 ENCOUNTER — Encounter: Payer: Self-pay | Admitting: Family Medicine

## 2011-12-03 VITALS — BP 148/98 | HR 84 | Temp 98.5°F | Resp 16 | Ht 67.0 in | Wt 156.4 lb

## 2011-12-03 DIAGNOSIS — K219 Gastro-esophageal reflux disease without esophagitis: Secondary | ICD-10-CM

## 2011-12-03 DIAGNOSIS — J45909 Unspecified asthma, uncomplicated: Secondary | ICD-10-CM

## 2011-12-03 DIAGNOSIS — IMO0001 Reserved for inherently not codable concepts without codable children: Secondary | ICD-10-CM

## 2011-12-03 DIAGNOSIS — F17200 Nicotine dependence, unspecified, uncomplicated: Secondary | ICD-10-CM

## 2011-12-03 DIAGNOSIS — F411 Generalized anxiety disorder: Secondary | ICD-10-CM

## 2011-12-03 DIAGNOSIS — J209 Acute bronchitis, unspecified: Secondary | ICD-10-CM

## 2011-12-03 DIAGNOSIS — R030 Elevated blood-pressure reading, without diagnosis of hypertension: Secondary | ICD-10-CM

## 2011-12-03 MED ORDER — PENICILLIN V POTASSIUM 500 MG PO TABS: 500.0000 mg | ORAL_TABLET | Freq: Three times a day (TID) | ORAL | Status: AC

## 2011-12-03 MED ORDER — CEFTRIAXONE SODIUM 500 MG IJ SOLR
500.0000 mg | Freq: Once | INTRAMUSCULAR | Status: AC
  Administered 2011-12-03: 500 mg via INTRAMUSCULAR

## 2011-12-03 MED ORDER — ALBUTEROL SULFATE HFA 108 (90 BASE) MCG/ACT IN AERS: 2.0000 | INHALATION_SPRAY | Freq: Four times a day (QID) | RESPIRATORY_TRACT | Status: DC | PRN

## 2011-12-03 MED ORDER — METHYLPREDNISOLONE ACETATE PF 80 MG/ML IJ SUSP
80.0000 mg | Freq: Once | INTRAMUSCULAR | Status: AC
  Administered 2011-12-03: 80 mg via INTRAMUSCULAR

## 2011-12-03 MED ORDER — PREDNISONE (PAK) 5 MG PO TABS: 5.0000 mg | ORAL_TABLET | ORAL | Status: DC

## 2011-12-03 MED ORDER — IPRATROPIUM-ALBUTEROL 0.5-2.5 (3) MG/3ML IN SOLN: 3.0000 mL | Freq: Three times a day (TID) | RESPIRATORY_TRACT | Status: DC | PRN

## 2011-12-03 NOTE — Assessment & Plan Note (Signed)
Acute flare with cough

## 2011-12-03 NOTE — Patient Instructions (Signed)
F/u in 3 month  You are having an asthma flare.  You will get Rocephin 500mg  im, , and depomedrol, 80mg  Im and a breathing treatment in the office.  Prednisone, and penicillin are sent to your pharmacy.  You need to stop smoking and stop drinking, these are both not good/bad for all aspects of your health

## 2011-12-07 DIAGNOSIS — IMO0001 Reserved for inherently not codable concepts without codable children: Secondary | ICD-10-CM | POA: Insufficient documentation

## 2011-12-07 NOTE — Assessment & Plan Note (Signed)
Likely due top recent alcohol over indulgence, will monitor

## 2011-12-07 NOTE — Progress Notes (Signed)
  Subjective:    Patient ID: Bryan Kelley, male    DOB: September 05, 1956, 56 y.o.   MRN: [id number]  HPI 1 week h/o increased cough with shortness of breath wheeze and sputum production for the past 4 days which is yellow. Denies sinus pressure, ear pain or soer throat, using inhaler more often than usual. Reports excessive drinking recently, hence blood pressure elevate    Review of Systems See HPI  Denies chest pains, palpitations and leg swelling Denies abdominal pain, nausea, vomiting,diarrhea or constipation.   Denies dysuria, frequency, hesitancy or incontinence. Denies joint pain, swelling and limitation in mobility. Denies headaches, seizures, numbness, or tingling. Denies depression, anxiety or insomnia. Denies skin break down or rash.        Objective:   Physical Exam Patient alert and oriented and in no cardiopulmonary distress.  HEENT: No facial asymmetry, EOMI, no sinus tenderness,  oropharynx pink and moist.  Neck supple no adenopathy.  Chest: decreased air entry, bilateral wheezing and scattered crackles  CVS: S1, S2 no murmurs, no S3.  ABD: Soft non tender. Bowel sounds normal.  Ext: No edema  MS: Adequate ROM spine, shoulders, hips and knees.  Skin: Intact, no ulcerations or rash noted.  Psych: Good eye contact, normal affect. Memory intact not anxious or depressed appearing.  CNS: CN 2-12 intact, power, tone and sensation normal throughout.        Assessment & Plan:

## 2011-12-07 NOTE — Assessment & Plan Note (Signed)
Controlled, no change in medication  

## 2011-12-07 NOTE — Assessment & Plan Note (Signed)
Unchanged , counselesd

## 2011-12-07 NOTE — Assessment & Plan Note (Signed)
Unchanged, chronic xanax as before

## 2011-12-07 NOTE — Assessment & Plan Note (Signed)
Acute infection exacerbating asthma, antibiotics administered and prescribed

## 2011-12-15 ENCOUNTER — Emergency Department: Payer: Self-pay | Admitting: Emergency Medicine

## 2011-12-24 ENCOUNTER — Telehealth: Payer: Self-pay | Admitting: Family Medicine

## 2011-12-24 DIAGNOSIS — J45909 Unspecified asthma, uncomplicated: Secondary | ICD-10-CM

## 2011-12-24 MED ORDER — ALPRAZOLAM 1 MG PO TABS: 1.0000 mg | ORAL_TABLET | Freq: Three times a day (TID) | ORAL | Status: DC | PRN

## 2011-12-24 MED ORDER — IPRATROPIUM-ALBUTEROL 0.5-2.5 (3) MG/3ML IN SOLN: 3.0000 mL | Freq: Three times a day (TID) | RESPIRATORY_TRACT | Status: DC | PRN

## 2011-12-24 NOTE — Telephone Encounter (Signed)
Sent in per request 

## 2012-01-15 ENCOUNTER — Emergency Department: Payer: Self-pay | Admitting: Unknown Physician Specialty

## 2012-02-20 ENCOUNTER — Telehealth: Payer: Self-pay | Admitting: Family Medicine

## 2012-02-20 MED ORDER — ALPRAZOLAM 1 MG PO TABS: 1.0000 mg | ORAL_TABLET | Freq: Three times a day (TID) | ORAL | Status: DC | PRN

## 2012-02-20 NOTE — Telephone Encounter (Signed)
I don't see any refill request for "pain" meds. I only see one for xanax and I will refill

## 2012-03-01 ENCOUNTER — Ambulatory Visit: Payer: Medicaid Other | Admitting: Family Medicine

## 2012-03-09 ENCOUNTER — Ambulatory Visit: Payer: Medicaid Other | Admitting: Family Medicine

## 2012-03-09 ENCOUNTER — Encounter: Payer: Self-pay | Admitting: Family Medicine

## 2012-03-09 ENCOUNTER — Ambulatory Visit (INDEPENDENT_AMBULATORY_CARE_PROVIDER_SITE_OTHER): Payer: Medicaid Other | Admitting: Family Medicine

## 2012-03-09 VITALS — BP 120/90 | HR 76 | Resp 16 | Ht 67.0 in | Wt 151.4 lb

## 2012-03-09 DIAGNOSIS — J45909 Unspecified asthma, uncomplicated: Secondary | ICD-10-CM

## 2012-03-09 DIAGNOSIS — F411 Generalized anxiety disorder: Secondary | ICD-10-CM

## 2012-03-09 DIAGNOSIS — F17200 Nicotine dependence, unspecified, uncomplicated: Secondary | ICD-10-CM

## 2012-03-09 DIAGNOSIS — F1010 Alcohol abuse, uncomplicated: Secondary | ICD-10-CM

## 2012-03-09 MED ORDER — BUDESONIDE-FORMOTEROL FUMARATE 160-4.5 MCG/ACT IN AERO: 2.0000 | INHALATION_SPRAY | Freq: Two times a day (BID) | RESPIRATORY_TRACT | Status: DC

## 2012-03-09 MED ORDER — ALPRAZOLAM 1 MG PO TABS: 1.0000 mg | ORAL_TABLET | Freq: Three times a day (TID) | ORAL | Status: DC | PRN
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
## 2012-03-09 NOTE — Patient Instructions (Signed)
F/u in 4  Month  Please cut back on cigarettes gradually, you need to quit  Please cut back on beer, this will damage your liver over time.  You need a breathing machine that works, talk with the pharmacist  You need to be on 2 inhalers every day, I am prescribing them

## 2012-03-14 DIAGNOSIS — F1010 Alcohol abuse, uncomplicated: Secondary | ICD-10-CM | POA: Insufficient documentation
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
## 2012-03-14 NOTE — Assessment & Plan Note (Signed)
Uncontrolled,non compliant with maintenance treatment, re educated re the importance of this

## 2012-03-14 NOTE — Assessment & Plan Note (Signed)
unchanged

## 2012-03-14 NOTE — Assessment & Plan Note (Signed)
Chronic and unchanged, continue med

## 2012-03-15 ENCOUNTER — Other Ambulatory Visit: Payer: Self-pay | Admitting: Family Medicine

## 2012-03-15 ENCOUNTER — Telehealth: Payer: Self-pay | Admitting: Family Medicine

## 2012-03-15 MED ORDER — BECLOMETHASONE DIPROPIONATE 40 MCG/ACT IN AERS: 2.0000 | INHALATION_SPRAY | Freq: Two times a day (BID) | RESPIRATORY_TRACT | Status: DC

## 2012-03-15 NOTE — Telephone Encounter (Signed)
symbicort not covered, I have changed to qvar, pls fax and let pt and pharmacy know

## 2012-03-15 NOTE — Telephone Encounter (Signed)
Noted  

## 2012-03-16 ENCOUNTER — Other Ambulatory Visit: Payer: Self-pay

## 2012-03-23 ENCOUNTER — Emergency Department: Payer: Self-pay | Admitting: Emergency Medicine

## 2012-06-07 ENCOUNTER — Telehealth: Payer: Self-pay | Admitting: Family Medicine

## 2012-06-07 DIAGNOSIS — J45909 Unspecified asthma, uncomplicated: Secondary | ICD-10-CM

## 2012-06-07 MED ORDER — ALBUTEROL SULFATE HFA 108 (90 BASE) MCG/ACT IN AERS: 2.0000 | INHALATION_SPRAY | Freq: Four times a day (QID) | RESPIRATORY_TRACT | Status: DC | PRN

## 2012-06-07 MED ORDER — ALPRAZOLAM 1 MG PO TABS: 1.0000 mg | ORAL_TABLET | Freq: Three times a day (TID) | ORAL | Status: DC | PRN

## 2012-06-07 NOTE — Telephone Encounter (Signed)
Refills done.

## 2012-07-13 ENCOUNTER — Ambulatory Visit: Payer: Medicaid Other | Admitting: Family Medicine

## 2012-08-12 ENCOUNTER — Ambulatory Visit (INDEPENDENT_AMBULATORY_CARE_PROVIDER_SITE_OTHER): Payer: Medicaid Other | Admitting: Family Medicine

## 2012-08-12 ENCOUNTER — Encounter: Payer: Self-pay | Admitting: Family Medicine

## 2012-08-12 VITALS — BP 130/72 | HR 70 | Resp 18 | Ht 67.0 in | Wt 152.1 lb

## 2012-08-12 DIAGNOSIS — F17200 Nicotine dependence, unspecified, uncomplicated: Secondary | ICD-10-CM

## 2012-08-12 DIAGNOSIS — J209 Acute bronchitis, unspecified: Secondary | ICD-10-CM

## 2012-08-12 DIAGNOSIS — R1013 Epigastric pain: Secondary | ICD-10-CM

## 2012-08-12 DIAGNOSIS — J45909 Unspecified asthma, uncomplicated: Secondary | ICD-10-CM

## 2012-08-12 MED ORDER — PREDNISONE 10 MG PO TABS: ORAL_TABLET | ORAL | Status: DC

## 2012-08-12 MED ORDER — BECLOMETHASONE DIPROPIONATE 40 MCG/ACT IN AERS: 2.0000 | INHALATION_SPRAY | Freq: Two times a day (BID) | RESPIRATORY_TRACT | Status: DC

## 2012-08-12 MED ORDER — ALBUTEROL SULFATE HFA 108 (90 BASE) MCG/ACT IN AERS: 2.0000 | INHALATION_SPRAY | Freq: Four times a day (QID) | RESPIRATORY_TRACT | Status: DC | PRN

## 2012-08-12 MED ORDER — OMEPRAZOLE 20 MG PO CPDR: 20.0000 mg | DELAYED_RELEASE_CAPSULE | Freq: Every day | ORAL | Status: DC

## 2012-08-12 NOTE — Assessment & Plan Note (Signed)
Unchanged, not ready to quit tobacco 

## 2012-08-12 NOTE — Progress Notes (Signed)
  Subjective:    Patient ID: Bryan Kelley, male    DOB: 1956-03-10, 56 y.o.   MRN: [id number]  HPI  Patient presents to followup asthma and abdominal pain. He states he's had epigastric discomfort for the past week. He does not have a burning sensation but feels nauseous after he eats.  He's been wheezing and coughing more over the past few weeks. He is out of his inhalers both Qvar and albuterol he states he thinks he used too much of them. His nebulizer machine does not work therefore he has not been using his meds. He continues to smoke  Review of Systems  GEN- denies fatigue, fever, weight loss,weakness, recent illness HEENT- denies eye drainage, change in vision, nasal discharge, CVS- denies chest pain, palpitations RESP- denies SOB, +cough,+ wheeze ABD- denies N/V, change in stools, abd pain GU- denies dysuria, hematuria, dribbling, incontinence MSK- denies joint pain, muscle aches, injury Neuro- denies headache, dizziness, syncope, seizure activity      Objective:   Physical Exam GEN- NAD, alert and oriented x3 HEENT- PERRL, EOMI, non injected sclera, pink conjunctiva, MMM, oropharynx clear Neck- Supple, no LAD CVS- RRR, no murmur RESP- Bilateral Wheezing  ABD-NABS,soft,mild TTP Epigastric region, no rebound, no guarding, no HSM EXT- No edema Pulses- Radial, DP- 2+        Assessment & Plan:

## 2012-08-12 NOTE — Assessment & Plan Note (Signed)
Benign exam, likley related to acid reflux and his ETOH, restart PPI

## 2012-08-12 NOTE — Assessment & Plan Note (Signed)
Deteriorated, unable to get meds today, given albuterol treatment, start prednisone. New script for nebulizer given

## 2012-08-12 NOTE — Assessment & Plan Note (Signed)
Per above, prednisone, restart inhalers

## 2012-08-12 NOTE — Patient Instructions (Addendum)
Take the prednisone as prescribed Restart acid blocking medication Restart your inhalers Nebulizer machine sent Take Qvar twice a day ALbuterol as needed for wheezing or shortness of breath Get the labs drawn You need to quit smoking! F/U 4 months Dr. Lodema Hong

## 2012-09-07 ENCOUNTER — Ambulatory Visit: Payer: Medicaid Other | Admitting: Family Medicine

## 2012-09-07 ENCOUNTER — Encounter: Payer: Self-pay | Admitting: Family Medicine

## 2012-12-09 ENCOUNTER — Ambulatory Visit: Payer: Medicaid Other | Admitting: Family Medicine

## 2013-05-26 ENCOUNTER — Other Ambulatory Visit: Payer: Self-pay | Admitting: Family Medicine

## 2018-08-29 DIAGNOSIS — R109 Unspecified abdominal pain: Secondary | ICD-10-CM | POA: Diagnosis not present

## 2018-08-29 DIAGNOSIS — R739 Hyperglycemia, unspecified: Secondary | ICD-10-CM | POA: Diagnosis not present

## 2018-08-29 DIAGNOSIS — R112 Nausea with vomiting, unspecified: Secondary | ICD-10-CM | POA: Diagnosis not present

## 2018-08-29 DIAGNOSIS — J441 Chronic obstructive pulmonary disease with (acute) exacerbation: Secondary | ICD-10-CM

## 2018-08-29 DIAGNOSIS — I160 Hypertensive urgency: Secondary | ICD-10-CM | POA: Diagnosis not present

## 2018-08-30 DIAGNOSIS — R109 Unspecified abdominal pain: Secondary | ICD-10-CM | POA: Diagnosis not present

## 2018-08-30 DIAGNOSIS — R739 Hyperglycemia, unspecified: Secondary | ICD-10-CM | POA: Diagnosis not present

## 2018-08-30 DIAGNOSIS — R112 Nausea with vomiting, unspecified: Secondary | ICD-10-CM | POA: Diagnosis not present

## 2018-08-30 DIAGNOSIS — I160 Hypertensive urgency: Secondary | ICD-10-CM | POA: Diagnosis not present

## 2018-08-31 DIAGNOSIS — R739 Hyperglycemia, unspecified: Secondary | ICD-10-CM | POA: Diagnosis not present

## 2018-08-31 DIAGNOSIS — R109 Unspecified abdominal pain: Secondary | ICD-10-CM | POA: Diagnosis not present

## 2018-08-31 DIAGNOSIS — I160 Hypertensive urgency: Secondary | ICD-10-CM | POA: Diagnosis not present

## 2018-08-31 DIAGNOSIS — R112 Nausea with vomiting, unspecified: Secondary | ICD-10-CM | POA: Diagnosis not present

## 2019-05-24 ENCOUNTER — Encounter: Payer: Self-pay | Admitting: Gastroenterology

## 2019-10-29 ENCOUNTER — Encounter: Payer: Self-pay | Admitting: Emergency Medicine

## 2019-10-29 ENCOUNTER — Emergency Department: Payer: Medicaid Other

## 2019-10-29 ENCOUNTER — Inpatient Hospital Stay
Admission: EM | Admit: 2019-10-29 | Discharge: 2019-11-01 | DRG: 192 | Disposition: A | Discharge status: Home / Self Care | Payer: Medicaid Other | Attending: Internal Medicine | Admitting: Internal Medicine

## 2019-10-29 ENCOUNTER — Other Ambulatory Visit: Payer: Self-pay

## 2019-10-29 DIAGNOSIS — J441 Chronic obstructive pulmonary disease with (acute) exacerbation: Secondary | ICD-10-CM | POA: Diagnosis not present

## 2019-10-29 DIAGNOSIS — Z823 Family history of stroke: Secondary | ICD-10-CM

## 2019-10-29 DIAGNOSIS — K219 Gastro-esophageal reflux disease without esophagitis: Secondary | ICD-10-CM | POA: Diagnosis present

## 2019-10-29 DIAGNOSIS — Z825 Family history of asthma and other chronic lower respiratory diseases: Secondary | ICD-10-CM

## 2019-10-29 DIAGNOSIS — J449 Chronic obstructive pulmonary disease, unspecified: Secondary | ICD-10-CM | POA: Diagnosis present

## 2019-10-29 DIAGNOSIS — Z7951 Long term (current) use of inhaled steroids: Secondary | ICD-10-CM

## 2019-10-29 DIAGNOSIS — R739 Hyperglycemia, unspecified: Secondary | ICD-10-CM

## 2019-10-29 DIAGNOSIS — R7303 Prediabetes: Secondary | ICD-10-CM | POA: Diagnosis present

## 2019-10-29 DIAGNOSIS — Z87891 Personal history of nicotine dependence: Secondary | ICD-10-CM

## 2019-10-29 DIAGNOSIS — F329 Major depressive disorder, single episode, unspecified: Secondary | ICD-10-CM | POA: Diagnosis present

## 2019-10-29 DIAGNOSIS — Z8249 Family history of ischemic heart disease and other diseases of the circulatory system: Secondary | ICD-10-CM

## 2019-10-29 DIAGNOSIS — Z833 Family history of diabetes mellitus: Secondary | ICD-10-CM

## 2019-10-29 DIAGNOSIS — H548 Legal blindness, as defined in USA: Secondary | ICD-10-CM | POA: Diagnosis present

## 2019-10-29 DIAGNOSIS — Z79899 Other long term (current) drug therapy: Secondary | ICD-10-CM

## 2019-10-29 DIAGNOSIS — Z20828 Contact with and (suspected) exposure to other viral communicable diseases: Secondary | ICD-10-CM | POA: Diagnosis present

## 2019-10-29 HISTORY — DX: Chronic obstructive pulmonary disease with (acute) exacerbation: J44.1

## 2019-10-29 LAB — PROCALCITONIN: Procalcitonin: <0.1 ng/mL

## 2019-10-29 LAB — CBC
HCT: 43.9 % (ref 39.0–52.0)
Hemoglobin: 14.9 g/dL (ref 13.0–17.0)
MCH: 30.3 pg (ref 26.0–34.0)
MCHC: 33.9 g/dL (ref 30.0–36.0)
MCV: 89.2 fL (ref 80.0–100.0)
Platelets: 213 10*3/uL (ref 150–400)
RBC: 4.92 MIL/uL (ref 4.22–5.81)
RDW: 13.9 % (ref 11.5–15.5)
WBC: 7 10*3/uL (ref 4.0–10.5)
nRBC: 0 % (ref 0.0–0.2)

## 2019-10-29 LAB — BASIC METABOLIC PANEL
Anion gap: 15 (ref 5–15)
BUN: 15 mg/dL (ref 8–23)
CO2: 25 mmol/L (ref 22–32)
Calcium: 9.1 mg/dL (ref 8.9–10.3)
Chloride: 102 mmol/L (ref 98–111)
Creatinine, Ser: 0.89 mg/dL (ref 0.61–1.24)
GFR calc Af Amer: >60 mL/min (ref >60)
GFR calc non Af Amer: >60 mL/min (ref >60)
Glucose, Bld: 176 mg/dL — ABNORMAL HIGH (ref 70–99)
Potassium: 3.8 mmol/L (ref 3.5–5.1)
Sodium: 142 mmol/L (ref 135–145)

## 2019-10-29 LAB — TROPONIN I (HIGH SENSITIVITY): Troponin I (High Sensitivity): 10 ng/L (ref <18)

## 2019-10-29 LAB — RESPIRATORY PANEL BY RT PCR (FLU A&B, COVID)
Influenza A by PCR: NEGATIVE
Influenza B by PCR: NEGATIVE
SARS Coronavirus 2 by RT PCR: NEGATIVE

## 2019-10-29 LAB — POC SARS CORONAVIRUS 2 AG: SARS Coronavirus 2 Ag: NEGATIVE

## 2019-10-29 LAB — BRAIN NATRIURETIC PEPTIDE: B Natriuretic Peptide: 23 pg/mL (ref 0.0–100.0)

## 2019-10-29 MED ORDER — IPRATROPIUM-ALBUTEROL 0.5-2.5 (3) MG/3ML IN SOLN
3.0000 mL | Freq: Four times a day (QID) | RESPIRATORY_TRACT | Status: DC
  Administered 2019-10-30 – 2019-11-01 (×10): 3 mL via RESPIRATORY_TRACT
  Filled 2019-10-29 (×9): qty 3

## 2019-10-29 MED ORDER — SENNOSIDES-DOCUSATE SODIUM 8.6-50 MG PO TABS
1.0000 | ORAL_TABLET | Freq: Every evening | ORAL | Status: DC | PRN
  Administered 2019-10-30: 1 via ORAL
  Filled 2019-10-29 (×2): qty 1

## 2019-10-29 MED ORDER — IPRATROPIUM-ALBUTEROL 0.5-2.5 (3) MG/3ML IN SOLN
3.0000 mL | Freq: Once | RESPIRATORY_TRACT | Status: AC
  Administered 2019-10-29: 3 mL via RESPIRATORY_TRACT
  Filled 2019-10-29: qty 3

## 2019-10-29 MED ORDER — METHYLPREDNISOLONE SODIUM SUCC 40 MG IJ SOLR
40.0000 mg | Freq: Four times a day (QID) | INTRAMUSCULAR | Status: AC
  Administered 2019-10-30 (×3): 40 mg via INTRAVENOUS
  Filled 2019-10-29 (×3): qty 1

## 2019-10-29 MED ORDER — ACETAMINOPHEN 325 MG PO TABS
650.0000 mg | ORAL_TABLET | Freq: Four times a day (QID) | ORAL | Status: DC | PRN
  Administered 2019-10-30: 650 mg via ORAL
  Filled 2019-10-29: qty 2

## 2019-10-29 MED ORDER — SODIUM CHLORIDE 0.9 % IV SOLN: INTRAVENOUS | Status: DC

## 2019-10-29 MED ORDER — SODIUM CHLORIDE 0.9 % IV SOLN
1.0000 g | INTRAVENOUS | Status: DC
  Administered 2019-10-30 – 2019-10-31 (×3): 1 g via INTRAVENOUS
  Filled 2019-10-29: qty 10
  Filled 2019-10-29: qty 1
  Filled 2019-10-29: qty 10
  Filled 2019-10-29: qty 1

## 2019-10-29 MED ORDER — ENOXAPARIN SODIUM 40 MG/0.4ML ~~LOC~~ SOLN
40.0000 mg | SUBCUTANEOUS | Status: DC
  Administered 2019-10-30 – 2019-10-31 (×3): 40 mg via SUBCUTANEOUS
  Filled 2019-10-29 (×3): qty 0.4

## 2019-10-29 MED ORDER — MAGNESIUM SULFATE 2 GM/50ML IV SOLN
2.0000 g | Freq: Once | INTRAVENOUS | Status: AC
  Administered 2019-10-29: 2 g via INTRAVENOUS
  Filled 2019-10-29: qty 50

## 2019-10-29 MED ORDER — ACETAMINOPHEN 650 MG RE SUPP: 650.0000 mg | Freq: Four times a day (QID) | RECTAL | Status: DC | PRN

## 2019-10-29 MED ORDER — ALBUTEROL SULFATE (2.5 MG/3ML) 0.083% IN NEBU: 2.5000 mg | INHALATION_SOLUTION | RESPIRATORY_TRACT | Status: DC | PRN

## 2019-10-29 MED ORDER — PREDNISONE 20 MG PO TABS
40.0000 mg | ORAL_TABLET | Freq: Every day | ORAL | Status: DC
  Administered 2019-10-31 – 2019-11-01 (×2): 40 mg via ORAL
  Filled 2019-10-29 (×2): qty 2

## 2019-10-29 MED ORDER — METHYLPREDNISOLONE SODIUM SUCC 125 MG IJ SOLR
125.0000 mg | Freq: Once | INTRAMUSCULAR | Status: AC
  Administered 2019-10-29: 125 mg via INTRAVENOUS
  Filled 2019-10-29: qty 2

## 2019-10-29 NOTE — ED Triage Notes (Signed)
Pt arrived via POV with reports of shortness of breath since last night, audible wheezing noted on arrival.  Pt sats 90% on RA on arrival initially. Up to 95%. Labored breathing present.

## 2019-10-29 NOTE — ED Notes (Signed)
ED TO INPATIENT HANDOFF REPORT  ED Nurse Name and Phone #: Anette Riedeloah [phone]  S Name/Age/Gender Bryan BirksWalter J Kelley 63 y.o. male Room/Bed: ED30A/ED30A  Code Status   Code Status: Full Code  Home/SNF/Other Home Patient oriented to: x4 Is this baseline? Yes   Triage Complete: Triage complete  Chief Complaint COPD exacerbation (HCC) [J44.1]  Triage Note Pt arrived via POV with reports of shortness of breath since last night, audible wheezing noted on arrival.  Pt sats 90% on RA on arrival initially. Up to 95%. Labored breathing present.     Allergies No Known Allergies  Level of Care/Admitting Diagnosis ED Disposition    ED Disposition Condition Comment   Admit  [facility]ospital [facility] [[id number]]  Level of Care: Med-Surg [16]  Covid Evaluation: Asymptomatic Screening Protocol (No Symptoms)  Diagnosis: COPD exacerbation [facility](HCC) [[id number]][[id number]]  Admitting Physician: Andris BaumannUNCAN, HAZEL V [[id number]]  Attending Physician: Andris BaumannUNCAN, HAZEL V [[id number]]  PT Class (Do Not Modify): Observation [104]  PT Acc Code (Do Not Modify): Observation [10022]       B Medical/Surgery History Past Medical History:  Diagnosis Date  . Asthma   . Depression   . GERD (gastroesophageal reflux disease)   . Vision loss, left eye    Past Surgical History:  Procedure Laterality Date  . EYE SURGERY  2009, about 2130,[id number]   steel in left eye on the job, legally blind      A IV Location/Drains/Wounds Patient Lines/Drains/Airways Status   Active Line/Drains/Airways    Name:   Placement date:   Placement time:   Site:   Days:   Peripheral IV 10/29/19 Left Antecubital   10/29/19    1716    Antecubital   less than 1          Intake/Output Last 24 hours  Intake/Output Summary (Last 24 hours) at 10/29/2019 2259 Last data filed at 10/29/2019 1849 Gross per 24 hour  Intake 50 ml  Output --  Net 50 ml    Labs/Imaging Results for orders placed or performed during the hospital encounter of  10/29/19 (from the past 48 hour(s))  Basic metabolic panel     Status: Abnormal   Collection Time: 10/29/19  5:17 PM  Result Value Ref Range   Sodium 142 135 - 145 mmol/L   Potassium 3.8 3.5 - 5.1 mmol/L   Chloride 102 98 - 111 mmol/L   CO2 25 22 - 32 mmol/L   Glucose, Bld 176 (H) 70 - 99 mg/dL   BUN 15 8 - 23 mg/dL   Creatinine, Ser 8.460.89 0.61 - 1.24 mg/dL   Calcium 9.1 8.9 - 96.210.3 mg/dL   GFR calc non Af Amer >60 >60 mL/min   GFR calc Af Amer >60 >60 mL/min   Anion gap 15 5 - 15    Comment: Performed at [facility], [address] [place], [place] [address]  CBC     Status: None   Collection Time: 10/29/19  5:17 PM  Result Value Ref Range   WBC 7.0 4.0 - 10.5 K/uL   RBC 4.92 4.22 - 5.81 MIL/uL   Hemoglobin 14.9 13.0 - 17.0 g/dL   HCT 13.243.9 44.039.0 - 10.252.0 %   MCV 89.2 80.0 - 100.0 fL   MCH 30.3 26.0 - 34.0 pg   MCHC 33.9 30.0 - 36.0 g/dL   RDW 72.513.9 36.611.5 - 44.015.5 %   Platelets 213 150 - 400 K/uL   nRBC 0.0 0.0 - 0.2 %  Comment: Performed at [facility], [address] [place], [place] [address]  Procalcitonin - Baseline     Status: None   Collection Time: 10/29/19  5:17 PM  Result Value Ref Range   Procalcitonin <0.10 ng/mL    Comment:        Interpretation: PCT (Procalcitonin) <= 0.5 ng/mL: Systemic infection (sepsis) is not likely. Local bacterial infection is possible. (NOTE)       Sepsis PCT Algorithm           Lower Respiratory Tract                                      Infection PCT Algorithm    ----------------------------     ----------------------------         PCT < 0.25 ng/mL                PCT < 0.10 ng/mL         Strongly encourage             Strongly discourage   discontinuation of antibiotics    initiation of antibiotics    ----------------------------     -----------------------------       PCT 0.25 - 0.50 ng/mL            PCT 0.10 - 0.25 ng/mL               OR       >80% decrease in PCT            Discourage initiation of                                             antibiotics      Encourage discontinuation           of antibiotics    ----------------------------     -----------------------------         PCT >= 0.50 ng/mL              PCT 0.26 - 0.50 ng/mL               AND        <80% decrease in PCT             Encourage initiation of                                             antibiotics       Encourage continuation           of antibiotics    ----------------------------     -----------------------------        PCT >= 0.50 ng/mL                  PCT > 0.50 ng/mL               AND         increase in PCT                  Strongly encourage  initiation of antibiotics    Strongly encourage escalation           of antibiotics                                     -----------------------------                                           PCT <= 0.25 ng/mL                                                 OR                                        > 80% decrease in PCT                                     Discontinue / Do not initiate                                             antibiotics Performed at [facility], [address] [place], [place] [address]   Troponin I (High Sensitivity)     Status: None   Collection Time: 10/29/19  5:17 PM  Result Value Ref Range   Troponin I (High Sensitivity) 10 <18 ng/L    Comment: (NOTE) Elevated high sensitivity troponin I (hsTnI) values and significant  changes across serial measurements may suggest ACS but many other  chronic and acute conditions are known to elevate hsTnI results.  Refer to the "Links" section for chest pain algorithms and additional  guidance. Performed at [facility], [address] [place], [place] [address]   Brain natriuretic peptide     Status: None   Collection Time: 10/29/19  5:17 PM  Result Value Ref Range   B Natriuretic Peptide 23.0 0.0 - 100.0 pg/mL    Comment: Performed at [facility], [address] [place], [place] [address]  POC SARS Coronavirus 2 Ag     Status: None   Collection Time: 10/29/19  5:52 PM  Result Value Ref Range   SARS Coronavirus 2 Ag NEGATIVE NEGATIVE    Comment: (NOTE) SARS-CoV-2 antigen NOT DETECTED.  Negative results are presumptive.  Negative results do not preclude SARS-CoV-2 infection and should not be used as the sole basis for treatment or other patient management decisions, including infection  control decisions, particularly in the presence of clinical signs and  symptoms consistent with COVID-19, or in those who have been in contact with the virus.  Negative results must be combined with clinical observations, patient history, and epidemiological information. The expected result is Negative. Fact Sheet for Patients: https://sanders-williams.net/ Fact Sheet for Healthcare Providers: https://martinez.com/ This test is not yet approved or cleared by the [place] FDA and  has been authorized for detection and/or diagnosis of SARS-CoV-2 by FDA under an  Emergency Use Authorization (EUA).  This EUA will remain in effect (meaning this test can be used) for the duration of  the COVID-19 de claration under Section 564(b)(1) of the Act, 21 U.S.C. section 360bbb-3(b)(1), unless the authorization is terminated or revoked sooner.   Respiratory Panel by RT PCR (Flu A&B, Covid) - Nasopharyngeal Swab     Status: None   Collection Time: 10/29/19  5:55 PM   Specimen: Nasopharyngeal Swab  Result Value Ref Range   SARS Coronavirus 2 by RT PCR NEGATIVE NEGATIVE    Comment: (NOTE) SARS-CoV-2 target nucleic acids are NOT DETECTED. The SARS-CoV-2 RNA is generally detectable in upper respiratoy specimens during the acute phase of infection. The lowest concentration of SARS-CoV-2 viral copies this assay can detect is 131 copies/mL. A negative result does not preclude SARS-Cov-2 infection and should not be  used as the sole basis for treatment or other patient management decisions. A negative result may occur with  improper specimen collection/handling, submission of specimen other than nasopharyngeal swab, presence of viral mutation(s) within the areas targeted by this assay, and inadequate number of viral copies (<131 copies/mL). A negative result must be combined with clinical observations, patient history, and epidemiological information. The expected result is Negative. Fact Sheet for Patients:  PinkCheek.be Fact Sheet for Healthcare Providers:  GravelBags.it This test is not yet ap proved or cleared by the [place] FDA and  has been authorized for detection and/or diagnosis of SARS-CoV-2 by FDA under an Emergency Use Authorization (EUA). This EUA will remain  in effect (meaning this test can be used) for the duration of the COVID-19 declaration under Section 564(b)(1) of the Act, 21 U.S.C. section 360bbb-3(b)(1), unless the authorization is terminated or revoked sooner.    Influenza A by PCR NEGATIVE NEGATIVE   Influenza B by PCR NEGATIVE NEGATIVE    Comment: (NOTE) The Xpert Xpress SARS-CoV-2/FLU/RSV assay is intended as an aid in  the diagnosis of influenza from Nasopharyngeal swab specimens and  should not be used as a sole basis for treatment. Nasal washings and  aspirates are unacceptable for Xpert Xpress SARS-CoV-2/FLU/RSV  testing. Fact Sheet for Patients: PinkCheek.be Fact Sheet for Healthcare Providers: GravelBags.it This test is not yet approved or cleared by the [place] FDA and  has been authorized for detection and/or diagnosis of SARS-CoV-2 by  FDA under an Emergency Use Authorization (EUA). This EUA will remain  in effect (meaning this test can be used) for the duration of the  Covid-19 declaration under Section 564(b)(1) of the Act, 21   U.S.C. section 360bbb-3(b)(1), unless the authorization is  terminated or revoked. Performed at [facility], [address] [place], [place] [address]    DG Chest Portable 1 View  Result Date: 10/29/2019 CLINICAL DATA:  Shortness of breath since last night.  Wheezing. EXAM: PORTABLE CHEST 1 VIEW COMPARISON:  08/29/2018 FINDINGS: Lungs are hyperexpanded. The lungs are clear without focal pneumonia, edema, pneumothorax or pleural effusion. The cardiopericardial silhouette is within normal limits for size. The visualized bony structures of the thorax are intact. Telemetry leads overlie the chest. IMPRESSION: No active disease. Electronically Signed   By: Misty Stanley M.D.   On: 10/29/2019 18:00    Pending Labs Unresulted Labs (From admission, onward)    Start     Ordered   11/05/19 0500  Creatinine, serum  (enoxaparin (LOVENOX)    CrCl >/= 30 ml/min)  Weekly,   STAT    Comments: while on enoxaparin therapy  10/29/19 2105   10/30/19 0500  Procalcitonin  Daily,   STAT     10/29/19 1724   10/30/19 0500  Basic metabolic panel  Tomorrow morning,   STAT     10/29/19 2105   10/30/19 0500  CBC  Tomorrow morning,   STAT     10/29/19 2105   10/29/19 2054  HIV Antibody (routine testing w rflx)  (HIV Antibody (Routine testing w reflex) panel)  Once,   STAT     10/29/19 2105          Vitals/Pain Today's Vitals   10/29/19 1932 10/29/19 2000 10/29/19 2030 10/29/19 2130  BP: (!) 150/99 (!) 158/90 (!) 155/87 (!) 172/97  Pulse: 86 70 70 79  Resp: (!) Temp:      TempSrc:      SpO2: 98% 96% 92% 94%  PainSc: 0-No pain       Isolation Precautions No active isolations  Medications Medications  enoxaparin (LOVENOX) injection 40 mg (has no administration in time range)  0.9 %  sodium chloride infusion ( Intravenous New Bag/Given 10/29/19 2235)  acetaminophen (TYLENOL) tablet 650 mg (has no administration in time range)    Or  acetaminophen (TYLENOL) suppository  650 mg (has no administration in time range)  senna-docusate (Senokot-S) tablet 1 tablet (has no administration in time range)  methylPREDNISolone sodium succinate (SOLU-MEDROL) 40 mg/mL injection 40 mg (40 mg Intravenous Not Given 10/29/19 2115)    Followed by  predniSONE (DELTASONE) tablet 40 mg (has no administration in time range)  ipratropium-albuterol (DUONEB) 0.5-2.5 (3) MG/3ML nebulizer solution 3 mL (3 mLs Nebulization Not Given 10/29/19 2115)  albuterol (PROVENTIL) (2.5 MG/3ML) 0.083% nebulizer solution 2.5 mg (has no administration in time range)  ipratropium-albuterol (DUONEB) 0.5-2.5 (3) MG/3ML nebulizer solution 3 mL (3 mLs Nebulization Given 10/29/19 1746)  ipratropium-albuterol (DUONEB) 0.5-2.5 (3) MG/3ML nebulizer solution 3 mL (3 mLs Nebulization Given 10/29/19 1746)  ipratropium-albuterol (DUONEB) 0.5-2.5 (3) MG/3ML nebulizer solution 3 mL (3 mLs Nebulization Given 10/29/19 1746)  methylPREDNISolone sodium succinate (SOLU-MEDROL) 125 mg/2 mL injection 125 mg (125 mg Intravenous Given 10/29/19 1738)  magnesium sulfate IVPB 2 g 50 mL (0 g Intravenous Stopped 10/29/19 1849)    Mobility walks Low fall risk   Focused Assessments Cardiac Assessment Handoff:    No results found for: CKTOTAL, CKMB, CKMBINDEX, TROPONINI No results found for: DDIMER Does the Patient currently have chest pain? No   , Pulmonary Assessment Handoff:  Lung sounds: Bilateral Breath Sounds: Expiratory wheezes L Breath Sounds: Other (Comment), Diminished, Expiratory wheezes(upper wheeze) R Breath Sounds: Clear, Expiratory wheezes O2 Device: Room Air O2 Flow Rate (L/min): 4 L/min      R Recommendations: See Admitting Provider Note  Report given to:   Additional Notes:

## 2019-10-29 NOTE — Progress Notes (Signed)
Patient now off bipap on nasal cannula tolerating well

## 2019-10-29 NOTE — ED Notes (Signed)
Lab called regarding need for venipuncture assist.

## 2019-10-29 NOTE — ED Notes (Signed)
Respiratory effort has improved on bipap Pt continues to c/o chest tightness/congestoin - nebulizers are still running through bipap

## 2019-10-29 NOTE — ED Notes (Signed)
Pt ambulatory in hallway to toilet; one in room is broken

## 2019-10-29 NOTE — ED Notes (Signed)
Pt on room air; 93% RA, given meal tray and drinks

## 2019-10-29 NOTE — ED Notes (Signed)
Pt presents in respiratory distress - he states he has been SHOB x2-3 days with hx of same - he denies covid exposure - pt placed on bipap

## 2019-10-29 NOTE — ED Provider Notes (Signed)
[facility] Emergency Department Provider Note  ____________________________________________   First MD Initiated Contact with Patient 10/29/19 1724     (approximate)  I have reviewed the triage vital signs and the nursing notes.   HISTORY  Chief Complaint Shortness of Breath    HPI Bryan Kelley is a 63 y.o. male with asthma, depression, COPD who comes in with shortness of breath.  Patient shortness of breath started yesterday, severe, constant, nothing makes better, nothing is worse.  Denies any loss of taste of smell.  No known coronavirus contacts.  States this feels similar to prior COPD flares.  States he just cannot catch his breath.  No chest pain.          Past Medical History:  Diagnosis Date  . Asthma   . Depression   . GERD (gastroesophageal reflux disease)   . Vision loss, left eye     Patient Active Problem List   Diagnosis Date Noted  . Epigastric abdominal pain 08/12/2012  . Alcohol abuse, daily use 03/14/2012  . Elevated BP 12/07/2011  . ED (erectile dysfunction) 07/28/2011  . NICOTINE ADDICTION 10/29/2009  . ACUTE BRONCHITIS 05/07/2009  . 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 ANKLE PAIN, RIGHT 10/04/2008  . FATIGUE 10/04/2008  . ANXIETY 02/17/2008  . ASTHMA 02/17/2008  . GERD 02/17/2008  . BENIGN PROSTATIC HYPERTROPHY, HX OF 02/17/2008    Past Surgical History:  Procedure Laterality Date  . EYE SURGERY  2009, about 1007,1219   steel in left eye on the job, legally blind     Prior to Admission medications   Medication Sig Start Date End Date Taking? Authorizing Provider  albuterol (PROVENTIL HFA) 108 (90 BASE) MCG/ACT inhaler Inhale 2 puffs into the lungs every 6 (six) hours as needed for wheezing. 08/12/12 08/12/13  Alycia Rossetti, MD  beclomethasone (QVAR) 40 MCG/ACT inhaler Inhale 2 puffs into the lungs 2 (two) times daily. 08/12/12 08/12/13  Alycia Rossetti, MD  ipratropium-albuterol (DUONEB)  0.5-2.5 (3) MG/3ML SOLN Take 3 mLs by nebulization every 8 (eight) hours as needed. 12/24/11   Fayrene Helper, MD  omeprazole (PRILOSEC) 20 MG capsule Take 1 capsule (20 mg total) by mouth daily. 08/12/12   Alycia Rossetti, MD    Allergies Patient has no known allergies.  Family History  Problem Relation Age of Onset  . Diabetes Mother   . Hypertension Mother   . Stroke Mother   . Asthma Father   . Diabetes Father   . Diabetes Sister   . Hypertension Sister   . Diabetes Brother   . Hypertension Brother     Social History Social History   Tobacco Use  . Smoking status: Former Smoker    Packs/day: 0.50  . Smokeless tobacco: Never Used  Substance Use Topics  . Alcohol use: Yes    Comment: vodka on the weekends  . Drug use: No      Review of Systems Constitutional: No fever/chills Eyes: No visual changes. ENT: No sore throat. Cardiovascular: No chest pain Respiratory: Positive for SOB Gastrointestinal: No abdominal pain.  No nausea, no vomiting.  No diarrhea.  No constipation. Genitourinary: Negative for dysuria. Musculoskeletal: Negative for back pain. Skin: Negative for rash. Neurological: Negative for headaches, focal weakness or numbness. All other ROS negative ____________________________________________   PHYSICAL EXAM:  VITAL SIGNS: ED Triage Vitals [10/29/19 1714]  Enc Vitals Group     BP (!) 184/90     Pulse Rate (!) 103  Resp (!) 30     Temp 98.6 F (37 C)     Temp Source Oral     SpO2 95 %     Weight      Height      Head Circumference      Peak Flow      Pain Score 0     Pain Loc      Pain Edu?      Excl. in GC?     Constitutional: Alert and oriented.  In distress with his increased work of breathing Eyes: Conjunctivae are normal. EOMI. Head: Atraumatic. Nose: No congestion/rhinnorhea. Mouth/Throat: Mucous membranes are moist.   Neck: No stridor. Trachea Midline. FROM Cardiovascular: Normal rate, regular rhythm. Grossly  normal heart sounds.  Good peripheral circulation. Respiratory: Increased work of breathing with poor air exchange and some audible wheezing Gastrointestinal: Soft and nontender. No distention. No abdominal bruits.  Musculoskeletal: No lower extremity tenderness nor edema.  No joint effusions. Neurologic:  Normal speech and language. No gross focal neurologic deficits are appreciated.  Skin:  Skin is warm, dry and intact. No rash noted. Psychiatric: Mood and affect are normal. Speech and behavior are normal. GU: Deferred   ____________________________________________   LABS (all labs ordered are listed, but only abnormal results are displayed)  Labs Reviewed  BASIC METABOLIC PANEL - Abnormal; Notable for the following components:      Result Value   Glucose, Bld 176 (*)    All other components within normal limits  RESPIRATORY PANEL BY RT PCR (FLU A&B, COVID)  CBC  PROCALCITONIN  BRAIN NATRIURETIC PEPTIDE  PROCALCITONIN  POC SARS CORONAVIRUS 2 AG -  ED  POC SARS CORONAVIRUS 2 AG  TROPONIN I (HIGH SENSITIVITY)  TROPONIN I (HIGH SENSITIVITY)   ____________________________________________   ED ECG REPORT I, Concha SeMary E Meygan Kyser, the attending physician, personally viewed and interpreted this ECG.  EKG is normal sinus rate of 96, no ST elevations, no T wave inversions, normal intervals ____________________________________________  RADIOLOGY Vela ProseI, Detrich Rakestraw E Donia Yokum, personally viewed and evaluated these images (plain radiographs) as part of my medical decision making, as well as reviewing the written report by the radiologist.  ED MD interpretation:  No PNA   Official radiology report(s): DG Chest Portable 1 View  Result Date: 10/29/2019 CLINICAL DATA:  Shortness of breath since last night.  Wheezing. EXAM: PORTABLE CHEST 1 VIEW COMPARISON:  08/29/2018 FINDINGS: Lungs are hyperexpanded. The lungs are clear without focal pneumonia, edema, pneumothorax or pleural effusion. The  cardiopericardial silhouette is within normal limits for size. The visualized bony structures of the thorax are intact. Telemetry leads overlie the chest. IMPRESSION: No active disease. Electronically Signed   By: Kennith CenterEric  Mansell M.D.   On: 10/29/2019 18:00    ____________________________________________   PROCEDURES  Procedure(s) performed (including Critical Care):  .Critical Care Performed by: Concha SeFunke, Samyah Bilbo E, MD Authorized by: Concha SeFunke, Tayla Panozzo E, MD   Critical care provider statement:    Critical care time (minutes):  45   Critical care was necessary to treat or prevent imminent or life-threatening deterioration of the following conditions:  Respiratory failure   Critical care was time spent personally by me on the following activities:  Discussions with consultants, evaluation of patient's response to treatment, examination of patient, ordering and performing treatments and interventions, ordering and review of laboratory studies, ordering and review of radiographic studies, pulse oximetry, re-evaluation of patient's condition, obtaining history from patient or surrogate and review of old charts  ____________________________________________   INITIAL IMPRESSION / ASSESSMENT AND PLAN / ED COURSE   DIOGENES WHIRLEY was evaluated in Emergency Department on 10/29/2019 for the symptoms described in the history of present illness. He was evaluated in the context of the global COVID-19 pandemic, which necessitated consideration that the patient might be at risk for infection with the SARS-CoV-2 virus that causes COVID-19. Institutional protocols and algorithms that pertain to the evaluation of patients at risk for COVID-19 are in a state of rapid change based on information released by regulatory bodies including the CDC and federal and state organizations. These policies and algorithms were followed during the patient's care in the ED.     Pt presents with SOB.  Most concerning for COPD flare.  Given significant WOB and wheezing pt placed on bipap. Given mag, duonebs, steroids.    PNA-will get xray to evaluation Anemia-CBC to evaluate ACS- will get trops Arrhythmia-Will get EKG and keep on monitor.  COVID- will get testing per algorithm. PE-lower suspicion given no risk factors and other cause more likely  6:49 PM reevaluated patient and his work of breathing is much improved.  He now has better breath sounds and looks much more comfortable.  Patient is okay with trialing off the BiPAP.  7:32 PM reevaluated patient off BiPAP looking much more comfortable. Placed on 4 L patient was really never hypoxic so can probably remove this in a few minutes.  8:29 PM still doing well off BiPAP on room air.  Patient does still have some wheezing.  Given the extent of his presentation will discuss with hospital team for admission for COPD exacerbation   ____________________________________________   FINAL CLINICAL IMPRESSION(S) / ED DIAGNOSES   Final diagnoses:  COPD exacerbation (HCC)     MEDICATIONS GIVEN DURING THIS VISIT:  Medications  ipratropium-albuterol (DUONEB) 0.5-2.5 (3) MG/3ML nebulizer solution 3 mL (3 mLs Nebulization Given 10/29/19 1746)  ipratropium-albuterol (DUONEB) 0.5-2.5 (3) MG/3ML nebulizer solution 3 mL (3 mLs Nebulization Given 10/29/19 1746)  ipratropium-albuterol (DUONEB) 0.5-2.5 (3) MG/3ML nebulizer solution 3 mL (3 mLs Nebulization Given 10/29/19 1746)  methylPREDNISolone sodium succinate (SOLU-MEDROL) 125 mg/2 mL injection 125 mg (125 mg Intravenous Given 10/29/19 1738)  magnesium sulfate IVPB 2 g 50 mL (0 g Intravenous Stopped 10/29/19 1849)     ED Discharge Orders    None       Note:  This document was prepared using Dragon voice recognition software and may include unintentional dictation errors.   Concha Se, MD 10/29/19 2030

## 2019-10-29 NOTE — ED Notes (Signed)
Bryan Kelley (wife) [phone] Updated on pt condition at this time

## 2019-10-29 NOTE — ED Notes (Signed)
Pt moved to [facility] att

## 2019-10-29 NOTE — ED Notes (Signed)
Pt given phone to talk to sister and wife that is calling

## 2019-10-29 NOTE — H&P (Signed)
History and Physical    CAMBRIDGE DELEO NKN:[id number] DOB: 08-23-1956 DOA: 10/29/2019  PCP: Patient, No Pcp Per  Patient coming from: home  I have personally briefly reviewed patient's old medical records in [facility]  Chief Complaint: shortness of breath HPI: Bryan Kelley is a 63 y.o. male with medical history significant of COPD who presented to the emergency room with a 1 week history of wheezing not responding to home bronchodilator therapy that worsened in the past 2 to 3 days.  Has associated cough with production of brown phlegm.  Denies fever and denies chest pain.  Denies exposure to anyone with Covid and said this episode seems like his previous COPD flares.  ED Course: On arrival in the emergency room he was afebrile with a temperature 98.6.  He was tachycardic with heart rate of 103 blood pressure 138/66 and O2 sat was initially 90% on room air.Patient had labored breathing and was placed on BiPAP for a short while during the emergency room.  Was treated with several rounds of DuoNeb and also given IV steroids and magnesium.  End of BiPAP to nasal cannula and eventually taken off O2 altogether and maintain sats in the high 90s.  His blood work for the most part unremarkable.  White cell count was normal procalcitonin less than 0.1.  BNP and troponin were unremarkable.  EKG showed normal sinus rhythm.  Flu and Covid test were negative and chest x-ray showed no acute findings.  Review of Systems: As per HPI otherwise 10 point review of systems negative.    Past Medical History:  Diagnosis Date  . Asthma   . Depression   . GERD (gastroesophageal reflux disease)   . Vision loss, left eye     Past Surgical History:  Procedure Laterality Date  . EYE SURGERY  2009, about 3790,2409   steel in left eye on the job, legally blind      reports that he has quit smoking. He smoked 0.50 packs per day. He has never used smokeless tobacco. He reports current alcohol use. He reports  that he does not use drugs.  No Known Allergies  Family History  Problem Relation Age of Onset  . Diabetes Mother   . Hypertension Mother   . Stroke Mother   . Asthma Father   . Diabetes Father   . Diabetes Sister   . Hypertension Sister   . Diabetes Brother   . Hypertension Brother      Prior to Admission medications   Medication Sig Start Date End Date Taking? Authorizing Provider  albuterol (PROVENTIL HFA) 108 (90 BASE) MCG/ACT inhaler Inhale 2 puffs into the lungs every 6 (six) hours as needed for wheezing. 08/12/12 10/29/19 Yes [place], Velna Hatchet, MD  Brinzolamide-Brimonidine 1-0.2 % SUSP Apply 1 drop to eye 3 (three) times daily. Into left eye. 05/18/18  Yes [provider]  budesonide-formoterol (SYMBICORT) 160-4.5 MCG/ACT inhaler Inhale 2 puffs into the lungs 2 (two) times daily.   Yes [provider]  Dextran 70-Hypromellose (ARTIFICIAL TEARS) 0.1-0.3 % SOLN Apply 1 drop to eye 4 (four) times daily as needed. In both eyes for dry eyes. 01/26/18  Yes [provider]  ipratropium (ATROVENT HFA) 17 MCG/ACT inhaler Inhale 2 puffs into the lungs 3 (three) times daily.   Yes [provider]  ipratropium-albuterol (DUONEB) 0.5-2.5 (3) MG/3ML SOLN Take 3 mLs by nebulization every 8 (eight) hours as needed. 12/24/11  Yes Kerri Perches, MD  mometasone-formoterol [facility]) 100-5  MCG/ACT AERO Inhale 1 puff into the lungs 2 (two) times daily.   Yes [provider]  omeprazole (PRILOSEC) 20 MG capsule Take 1 capsule (20 mg total) by mouth daily. 08/12/12  Yes [place], Velna HatchetKawanta F, MD  tamsulosin (FLOMAX) 0.4 MG CAPS capsule Take 0.4 mg by mouth daily with breakfast. Take 30 minutes after breakfast.   Yes [provider]  beclomethasone (QVAR) 40 MCG/ACT inhaler Inhale 2 puffs into the lungs 2 (two) times daily. 08/12/12 08/12/13  Salley Scarleturham, Kawanta F, MD    Physical Exam: Vitals:   10/29/19 2000 10/29/19 2030 10/29/19 2130 10/29/19 2240    BP: (!) 158/90 (!) 155/87 (!) 172/97 (!) 152/84  Pulse: 70 70 79   Resp: 18 15 16    Temp:      TempSrc:      SpO2: 96% 92% 94%      Vitals:   10/29/19 2000 10/29/19 2030 10/29/19 2130 10/29/19 2240  BP: (!) 158/90 (!) 155/87 (!) 172/97 (!) 152/84  Pulse: 70 70 79   Resp: 18 15 16    Temp:      TempSrc:      SpO2: 96% 92% 94%     Constitutional: NAD, only tachypneic with conversational dyspnea eyes: PERRL, lids and conjunctivae normal ENMT: Mucous membranes are moist.  Neck: normal, supple, no masses, no thyromegaly Respiratory: Few rhonchi.  Decreased air entry globally, tachypneic Cardiovascular: Regular rate and rhythm, no murmurs / rubs / gallops. No extremity edema. 2+ pedal pulses. No carotid bruits.  Abdomen: no tenderness, no masses palpated. No hepatosplenomegaly. Bowel sounds positive.  Musculoskeletal: no clubbing / cyanosis. No joint deformity upper and lower extremities. Good ROM, no contractures. Normal muscle tone.  Skin: no rashes, lesions, ulcers. No induration Neurologic: CN 2-12 grossly intact. Sensation intact, DTR normal. Strength 5/5 in all 4.  Psychiatric: Normal judgment and insight. Alert and oriented x 3. Normal mood.    Labs on Admission: I have personally reviewed following labs and imaging studies  CBC: Recent Labs  Lab 10/29/19 1717  WBC 7.0  HGB 14.9  HCT 43.9  MCV 89.2  PLT 213   Basic Metabolic Panel: Recent Labs  Lab 10/29/19 1717  NA 142  K 3.8  CL 102  CO2 25  GLUCOSE 176*  BUN 15  CREATININE 0.89  CALCIUM 9.1   GFR: CrCl cannot be calculated (Unknown ideal weight.). Liver Function Tests: No results for input(s): AST, ALT, ALKPHOS, BILITOT, PROT, ALBUMIN in the last 168 hours. No results for input(s): LIPASE, AMYLASE in the last 168 hours. No results for input(s): AMMONIA in the last 168 hours. Coagulation Profile: No results for input(s): INR, PROTIME in the last 168 hours. Cardiac Enzymes: No results for input(s):  CKTOTAL, CKMB, CKMBINDEX, TROPONINI in the last 168 hours. BNP (last 3 results) No results for input(s): PROBNP in the last 8760 hours. HbA1C: No results for input(s): HGBA1C in the last 72 hours. CBG: No results for input(s): GLUCAP in the last 168 hours. Lipid Profile: No results for input(s): CHOL, HDL, LDLCALC, TRIG, CHOLHDL, LDLDIRECT in the last 72 hours. Thyroid Function Tests: No results for input(s): TSH, T4TOTAL, FREET4, T3FREE, THYROIDAB in the last 72 hours. Anemia Panel: No results for input(s): VITAMINB12, FOLATE, FERRITIN, TIBC, IRON, RETICCTPCT in the last 72 hours. Urine analysis: No results found for: COLORURINE, APPEARANCEUR, LABSPEC, PHURINE, GLUCOSEU, HGBUR, BILIRUBINUR, KETONESUR, PROTEINUR, UROBILINOGEN, NITRITE, LEUKOCYTESUR  Radiological Exams on Admission: DG Chest Portable 1 View  Result Date: 10/29/2019 CLINICAL DATA:  Shortness of breath since last night.  Wheezing. EXAM: PORTABLE CHEST 1 VIEW COMPARISON:  08/29/2018 FINDINGS: Lungs are hyperexpanded. The lungs are clear without focal pneumonia, edema, pneumothorax or pleural effusion. The cardiopericardial silhouette is within normal limits for size. The visualized bony structures of the thorax are intact. Telemetry leads overlie the chest. IMPRESSION: No active disease. Electronically Signed   By: Misty Stanley M.D.   On: 10/29/2019 18:00    EKG: Independently reviewed.  Normal sinus rhythm with no acute ST-T wave changes  Assessment/Plan    COPD with acute exacerbation (HCC) --Scheduled and as needed nebulized bronchodilator treatments --IV steroids --IV Rocephin for 5 days given severe flareup and sputum production --Supplemental oxygen as needed    DVT prophylaxis: lovenox  Code Status: full  Family Communication: none  Disposition Plan: Back to previous home environment Consults called: none  Admission status: obs    Athena Masse MD [facility]   If 7PM-7AM, please contact  night-coverage [email or web address] Password [email or web address]  10/29/2019, 11:28 PM

## 2019-10-29 NOTE — ED Notes (Signed)
Dr Jari Pigg request that pt be tried off of bipap - will notify RT and oncoming shift

## 2019-10-29 NOTE — ED Notes (Signed)
Pt back to room at this time, reports SOB and cough, O2 sats 94% on RA

## 2019-10-30 ENCOUNTER — Encounter: Payer: Self-pay | Admitting: Internal Medicine

## 2019-10-30 ENCOUNTER — Other Ambulatory Visit: Payer: Self-pay

## 2019-10-30 DIAGNOSIS — H548 Legal blindness, as defined in USA: Secondary | ICD-10-CM | POA: Diagnosis present

## 2019-10-30 DIAGNOSIS — Z20828 Contact with and (suspected) exposure to other viral communicable diseases: Secondary | ICD-10-CM | POA: Diagnosis present

## 2019-10-30 DIAGNOSIS — J441 Chronic obstructive pulmonary disease with (acute) exacerbation: Principal | ICD-10-CM

## 2019-10-30 DIAGNOSIS — R7303 Prediabetes: Secondary | ICD-10-CM | POA: Diagnosis present

## 2019-10-30 DIAGNOSIS — J449 Chronic obstructive pulmonary disease, unspecified: Secondary | ICD-10-CM | POA: Diagnosis present

## 2019-10-30 DIAGNOSIS — Z825 Family history of asthma and other chronic lower respiratory diseases: Secondary | ICD-10-CM | POA: Diagnosis not present

## 2019-10-30 DIAGNOSIS — Z833 Family history of diabetes mellitus: Secondary | ICD-10-CM | POA: Diagnosis not present

## 2019-10-30 DIAGNOSIS — K219 Gastro-esophageal reflux disease without esophagitis: Secondary | ICD-10-CM | POA: Diagnosis present

## 2019-10-30 DIAGNOSIS — Z8249 Family history of ischemic heart disease and other diseases of the circulatory system: Secondary | ICD-10-CM | POA: Diagnosis not present

## 2019-10-30 DIAGNOSIS — Z79899 Other long term (current) drug therapy: Secondary | ICD-10-CM | POA: Diagnosis not present

## 2019-10-30 DIAGNOSIS — Z7951 Long term (current) use of inhaled steroids: Secondary | ICD-10-CM | POA: Diagnosis not present

## 2019-10-30 DIAGNOSIS — R739 Hyperglycemia, unspecified: Secondary | ICD-10-CM | POA: Diagnosis not present

## 2019-10-30 DIAGNOSIS — Z823 Family history of stroke: Secondary | ICD-10-CM | POA: Diagnosis not present

## 2019-10-30 DIAGNOSIS — F329 Major depressive disorder, single episode, unspecified: Secondary | ICD-10-CM | POA: Diagnosis present

## 2019-10-30 DIAGNOSIS — Z87891 Personal history of nicotine dependence: Secondary | ICD-10-CM | POA: Diagnosis not present

## 2019-10-30 LAB — CBC
HCT: 41.4 % (ref 39.0–52.0)
Hemoglobin: 14.1 g/dL (ref 13.0–17.0)
MCH: 30.3 pg (ref 26.0–34.0)
MCHC: 34.1 g/dL (ref 30.0–36.0)
MCV: 88.8 fL (ref 80.0–100.0)
Platelets: 201 10*3/uL (ref 150–400)
RBC: 4.66 MIL/uL (ref 4.22–5.81)
RDW: 13.6 % (ref 11.5–15.5)
WBC: 7.1 10*3/uL (ref 4.0–10.5)
nRBC: 0 % (ref 0.0–0.2)

## 2019-10-30 LAB — BASIC METABOLIC PANEL
Anion gap: 10 (ref 5–15)
BUN: 16 mg/dL (ref 8–23)
CO2: 25 mmol/L (ref 22–32)
Calcium: 8.5 mg/dL — ABNORMAL LOW (ref 8.9–10.3)
Chloride: 101 mmol/L (ref 98–111)
Creatinine, Ser: 0.71 mg/dL (ref 0.61–1.24)
GFR calc Af Amer: >60 mL/min (ref >60)
GFR calc non Af Amer: >60 mL/min (ref >60)
Glucose, Bld: 234 mg/dL — ABNORMAL HIGH (ref 70–99)
Potassium: 4.3 mmol/L (ref 3.5–5.1)
Sodium: 136 mmol/L (ref 135–145)

## 2019-10-30 LAB — HIV ANTIBODY (ROUTINE TESTING W REFLEX): HIV Screen 4th Generation wRfx: NONREACTIVE

## 2019-10-30 LAB — TROPONIN I (HIGH SENSITIVITY): Troponin I (High Sensitivity): 8 ng/L (ref <18)

## 2019-10-30 LAB — PROCALCITONIN: Procalcitonin: <0.1 ng/mL

## 2019-10-30 MED ORDER — SODIUM CHLORIDE 0.9% FLUSH
3.0000 mL | Freq: Two times a day (BID) | INTRAVENOUS | Status: DC
  Administered 2019-10-31 – 2019-11-01 (×2): 3 mL via INTRAVENOUS

## 2019-10-30 MED ORDER — SODIUM CHLORIDE 0.9% FLUSH: 3.0000 mL | INTRAVENOUS | Status: DC | PRN

## 2019-10-30 NOTE — Progress Notes (Signed)
Pt ambulated around NS on RA with pulse ox 93 % on return to room, respirations 22,HR 85- tolerated well. Tylenol prn x 1 for HA. Pt slept at intervals. Bilateral decreased breath sounds with ausculatated wheezes without distress. Solumedrol IV continued. Rocephin IV therapy continued.

## 2019-10-30 NOTE — Evaluation (Signed)
Physical Therapy Evaluation Patient Details Name: Bryan Kelley MRN: [id number] DOB: 21-Mar-1956 Today's Date: 10/30/2019   History of Present Illness  Bryan Kelley is a 63 y.o. male with medical history significant of COPD who presented to the emergency room with a 1 week history of wheezing not responding to home bronchodilator therapy that worsened in the past 2 to 3 days.  Has associated cough with production of brown phlegm.  Denies fever and denies chest pain.  Denies exposure to anyone with Covid and said this episode seems like his previous COPD flares.  Clinical Impression  Pt is a 63 year old M who lives in a one story home and is independent with mobility at baseline.  Pt in Fowler's position in bed, just having walked with nursing and with apparent SOB with wheezing.  Pt agreeable to work with therapist, A&O x4.  He was able to perform bed mobility and STS mod I with min use of UE's.  He presented with fair/good strength of UE/LE's.  Pt walked 40 ft in room without AD and with good gait mechanics, becoming increasingly SOB with activity.  O2 sats remained in the low to mid 90% range.  Pt did not present with any apparent balance deficits but did report that he had a fall 2 days ago which was related to "celebrating Christmas" and not appropriate for PT rehabilitation. Pt will continue to benefit from skilled PT to assist with tolerance to activity and mild strength deficits.  Pt may also benefit from cardiac/pulmonary rehab but will need transportation provided.     Follow Up Recommendations Home health PT(Pt may benefit from cardiac/pulmonary rehabilitation if transportation can be provided.)    Equipment Recommendations  None recommended by PT    Recommendations for Other Services       Precautions / Restrictions Precautions Precautions: Fall Precaution Comments: recent fall per patient report Restrictions Weight Bearing Restrictions: No      Mobility  Bed Mobility Overal  bed mobility: Modified Independent                Transfers Overall transfer level: Modified independent                  Ambulation/Gait Ambulation/Gait assistance: Supervision Gait Distance (Feet): 40 Feet         General Gait Details: Good BOS, foot clearance, step length, pt visibly SOB but had just walked with nursing.  Vitals remained WNL.  Stairs            Wheelchair Mobility    Modified Rankin (Stroke Patients Only)       Balance Overall balance assessment: Independent;History of Falls(Pt reports that fall was related to "celebrating Christmas" and not something that PT will help with .)                                           Pertinent Vitals/Pain      Home Living Family/patient expects to be discharged to:: Private residence Living Arrangements: Other relatives(reports his nephew lives with him) Available Help at Discharge: Family Type of Home: Mobile home Home Access: Stairs to enter Entrance Stairs-Rails: Right Entrance Stairs-Number of Steps: 3 Home Layout: One level Home Equipment: None      Prior Function Level of Independence: Independent               Hand Dominance  Dominant Hand: Right    Extremity/Trunk Assessment   Upper Extremity Assessment Upper Extremity Assessment: Overall WFL for tasks assessed(Grip, elbow flex/ext: 4/5 bilat, full shoulder AROM.)    Lower Extremity Assessment Lower Extremity Assessment: Overall WFL for tasks assessed(Ankle DF/PF, knee flex/ext: 4+/5, hip flex: 4/5 bilat.)    Cervical / Trunk Assessment Cervical / Trunk Assessment: Normal  Communication   Communication: No difficulties  Cognition Arousal/Alertness: Awake/alert Behavior During Therapy: WFL for tasks assessed/performed Overall Cognitive Status: Within Functional Limits for tasks assessed                                        General Comments General comments (skin integrity,  edema, etc.): Patient reports he had a fall the other day prior to admission.    Exercises     Assessment/Plan    PT Assessment Patient needs continued PT services  PT Problem List Decreased activity tolerance;Decreased strength;Decreased mobility;Cardiopulmonary status limiting activity       PT Treatment Interventions DME instruction;Therapeutic activities;Gait training;Therapeutic exercise;Patient/family education;Stair training;Balance training;Functional mobility training    PT Goals (Current goals can be found in the Care Plan section)  Acute Rehab PT Goals Patient Stated Goal: to return home and be independent PT Goal Formulation: With patient Time For Goal Achievement: 11/14/19 Potential to Achieve Goals: Good    Frequency Min 2X/week   Barriers to discharge        Co-evaluation               AM-PAC PT "6 Clicks" Mobility  Outcome Measure Help needed turning from your back to your side while in a flat bed without using bedrails?: None Help needed moving from lying on your back to sitting on the side of a flat bed without using bedrails?: None Help needed moving to and from a bed to a chair (including a wheelchair)?: None Help needed standing up from a chair using your arms (e.g., wheelchair or bedside chair)?: None Help needed to walk in hospital room?: A Little Help needed climbing 3-5 steps with a railing? : A Little 6 Click Score: 22    End of Session Equipment Utilized During Treatment: Gait belt Activity Tolerance: Patient tolerated treatment well;Other (comment)(Limited by SOB.) Patient left: in bed;with call bell/phone within reach   PT Visit Diagnosis: Difficulty in walking, not elsewhere classified (R26.2);Muscle weakness (generalized) (M62.81)    Time: 5188-4166 PT Time Calculation (min) (ACUTE ONLY): 12 min   Charges:   PT Evaluation $PT Eval Low Complexity: 1 Low          Glenetta Hew, PT, DPT   Glenetta Hew 10/30/2019, 2:20  PM

## 2019-10-30 NOTE — Evaluation (Signed)
Occupational Therapy Evaluation Patient Details Name: Bryan Kelley MRN: [id number] DOB: [birth year] Today's Date: 10/30/2019    History of Present Illness Bryan Kelley is a 63 y.o. male with medical history significant of COPD who presented to the emergency room with a 1 week history of wheezing not responding to home bronchodilator therapy that worsened in the past 2 to 3 days.  Has associated cough with production of brown phlegm.  Denies fever and denies chest pain.  Denies exposure to anyone with Covid and said this episode seems like his previous COPD flares.   Clinical Impression   Patient seen for OT evaluation this date, patient admitted with COPD flare and reports he was independent prior to admission and has no adaptive equipment or prior knowledge of energy conservation techniques.  He reports his nephew lives with him, patient does not work or drive.  He presents with muscle weakness, shortness of breath with activity and requires increased time and effort to complete all basic self care tasks.  He would benefit from skilled OT services to maximize his safety and independence with necessary daily tasks.  Will plan to instruct patient on energy conservation techniques and adaptive equipment which can impact his performance with daily activities.      Follow Up Recommendations  Home health OT    Equipment Recommendations       Recommendations for Other Services       Precautions / Restrictions Precautions Precautions: Fall Precaution Comments: recent fall per patient report Restrictions Weight Bearing Restrictions: No      Mobility Bed Mobility Overal bed mobility: Modified Independent                Transfers Overall transfer level: Modified independent                    Balance Overall balance assessment: History of Falls                                         ADL either performed or assessed with clinical judgement   ADL  Overall ADL's : Needs assistance/impaired Eating/Feeding: Independent   Grooming: Independent   Upper Body Bathing: Independent   Lower Body Bathing: Set up Lower Body Bathing Details (indicate cue type and reason): shortness of breath and increased time noted during activities and would likely benefit from AE training for lower body self care Upper Body Dressing : Independent   Lower Body Dressing: Set up Lower Body Dressing Details (indicate cue type and reason): shortness of breath and increased time noted during activities and would likely benefit from AE training for lower body self care     Toileting- Clothing Manipulation and Hygiene: Supervision/safety       Functional mobility during ADLs: Supervision/safety General ADL Comments: Patient reports weakness, states he is not back to his baseline level of function.  Would be interested in energy conservation techniques and AE training.     Vision Baseline Vision/History: Wears glasses Patient Visual Report: No change from baseline       Perception     Praxis      Pertinent Vitals/Pain       Hand Dominance Right   Extremity/Trunk Assessment Upper Extremity Assessment Upper Extremity Assessment: Generalized weakness   Lower Extremity Assessment Lower Extremity Assessment: Defer to PT evaluation   Cervical / Trunk Assessment Cervical / Trunk Assessment: Normal  Communication Communication Communication: No difficulties   Cognition Arousal/Alertness: Awake/alert Behavior During Therapy: WFL for tasks assessed/performed Overall Cognitive Status: Within Functional Limits for tasks assessed                                     General Comments  Patient reports he had a fall the other day prior to admission.    Exercises     Shoulder Instructions      Home Living Family/patient expects to be discharged to:: Private residence Living Arrangements: Other relatives(reports his nephew lives  with him) Available Help at Discharge: Family Type of Home: Mobile home Home Access: Stairs to enter Secretary/administrator of Steps: 3 Entrance Stairs-Rails: Right Home Layout: One level     Bathroom Shower/Tub: Walk-in Pensions consultant: Standard     Home Equipment: None          Prior Functioning/Environment Level of Independence: Independent                 OT Problem List: Decreased strength;Decreased knowledge of use of DME or AE;Decreased activity tolerance      OT Treatment/Interventions: Self-care/ADL training;Therapeutic exercise;Patient/family education;Energy conservation;Therapeutic activities;DME and/or AE instruction    OT Goals(Current goals can be found in the care plan section) Acute Rehab OT Goals Patient Stated Goal: to return home and be independent OT Goal Formulation: With patient Time For Goal Achievement: 11/12/19 Potential to Achieve Goals: Good  OT Frequency:     Barriers to D/C:            Co-evaluation              AM-PAC OT "6 Clicks" Daily Activity     Outcome Measure Help from another person eating meals?: None Help from another person taking care of personal grooming?: None Help from another person toileting, which includes using toliet, bedpan, or urinal?: None Help from another person bathing (including washing, rinsing, drying)?: A Little Help from another person to put on and taking off regular upper body clothing?: None Help from another person to put on and taking off regular lower body clothing?: A Little 6 Click Score: 22   End of Session    Activity Tolerance: Patient tolerated treatment well Patient left: in bed;with call bell/phone within reach  OT Visit Diagnosis: Muscle weakness (generalized) (M62.81)                Time: 3716-9678 OT Time Calculation (min): 20 min Charges:  OT General Charges $OT Visit: 1 Visit OT Evaluation $OT Eval Low Complexity: 1 Low Anaysia Germer T Azael Ragain, OTR/L,  CLT   Quilla Freeze 10/30/2019, 12:33 PM

## 2019-10-30 NOTE — Progress Notes (Signed)
Progress Note    Michaelle BirksWalter J Yamada  ZOX:[id number] DOB: December 21, 1955  DOA: 10/29/2019 PCP: Patient, No Pcp Per      Brief Narrative:    Medical records reviewed and are as summarized below:  Michaelle BirksWalter J Felter is an 63 y.o. male with history of COPD, tobacco abuse (quit smoking 6 months ago), GERD, presented to the hospital with productive cough, shortness of breath and wheezing.  He required BiPAP briefly in the emergency room for acute respiratory distress.  He was admitted to the hospital for COPD exacerbation.      Assessment/Plan:   Principal Problem:   COPD with acute exacerbation (HCC) Active Problems:   COPD exacerbation (HCC)   Body mass index is 26.74 kg/m.  COPD exacerbation: Treat with empiric antibiotics, steroids and bronchodilators.  Robitussin as needed for cough.  Hyperglycemia: Steroids may be contributing to hyperglycemia.  Check hemoglobin A1c.      Family Communication/Anticipated D/C date and plan/Code Status   DVT prophylaxis: Lovenox Code Status: Full code Family Communication: Plan discussed with his wife over the phone Disposition Plan: For discharge to home in 1 to 2 days     Subjective:   He complains of productive cough, shortness of breath and wheezing.  No chest pain.  Objective:    Vitals:   10/30/19 0020 10/30/19 0223 10/30/19 0728 10/30/19 0900  BP:   (!) 137/97   Pulse:   71 84  Resp:   18 (!) 22  Temp:   97.6 F (36.4 C)   TempSrc: Oral  Oral   SpO2:  94% 95% 93%  Weight: 77.4 kg     Height: 5\' 7"  (1.702 m)       Intake/Output Summary (Last 24 hours) at 10/30/2019 1156 Last data filed at 10/30/2019 [id number] Gross per 24 hour  Intake 1100.15 ml  Output --  Net 1100.15 ml   Filed Weights   10/30/19 0020  Weight: 77.4 kg    Exam:  GEN: NAD SKIN: No rash EYES: Anicteric ENT: MMM CV: RRR PULM: Air entry reduced bilaterally, diffuse bilateral expiratory wheezing.  No rales heard ABD: soft, ND, NT, +BS CNS:  AAO x 3, non focal EXT: No edema or tenderness   Data Reviewed:   I have personally reviewed following labs and imaging studies:  Labs: Labs show the following:   Basic Metabolic Panel: Recent Labs  Lab 10/29/19 1717 10/30/19 0458  NA 142 136  K 3.8 4.3  CL 102 101  CO2 25 25  GLUCOSE 176* 234*  BUN 15 16  CREATININE 0.89 0.71  CALCIUM 9.1 8.5*   GFR Estimated Creatinine Clearance: 89.5 mL/min (by C-G formula based on SCr of 0.71 mg/dL). Liver Function Tests: No results for input(s): AST, ALT, ALKPHOS, BILITOT, PROT, ALBUMIN in the last 168 hours. No results for input(s): LIPASE, AMYLASE in the last 168 hours. No results for input(s): AMMONIA in the last 168 hours. Coagulation profile No results for input(s): INR, PROTIME in the last 168 hours.  CBC: Recent Labs  Lab 10/29/19 1717 10/30/19 0458  WBC 7.0 7.1  HGB 14.9 14.1  HCT 43.9 41.4  MCV 89.2 88.8  PLT 213 201   Cardiac Enzymes: No results for input(s): CKTOTAL, CKMB, CKMBINDEX, TROPONINI in the last 168 hours. BNP (last 3 results) No results for input(s): PROBNP in the last 8760 hours. CBG: No results for input(s): GLUCAP in the last 168 hours. D-Dimer: No results for input(s): DDIMER in the last 72  hours. Hgb A1c: No results for input(s): HGBA1C in the last 72 hours. Lipid Profile: No results for input(s): CHOL, HDL, LDLCALC, TRIG, CHOLHDL, LDLDIRECT in the last 72 hours. Thyroid function studies: No results for input(s): TSH, T4TOTAL, T3FREE, THYROIDAB in the last 72 hours.  Invalid input(s): FREET3 Anemia work up: No results for input(s): VITAMINB12, FOLATE, FERRITIN, TIBC, IRON, RETICCTPCT in the last 72 hours. Sepsis Labs: Recent Labs  Lab 10/29/19 1717 10/30/19 0458  PROCALCITON <0.10 <0.10  WBC 7.0 7.1    Microbiology Recent Results (from the past 240 hour(s))  Respiratory Panel by RT PCR (Flu A&B, Covid) - Nasopharyngeal Swab     Status: None   Collection Time: 10/29/19  5:55 PM     Specimen: Nasopharyngeal Swab  Result Value Ref Range Status   SARS Coronavirus 2 by RT PCR NEGATIVE NEGATIVE Final    Comment: (NOTE) SARS-CoV-2 target nucleic acids are NOT DETECTED. The SARS-CoV-2 RNA is generally detectable in upper respiratoy specimens during the acute phase of infection. The lowest concentration of SARS-CoV-2 viral copies this assay can detect is 131 copies/mL. A negative result does not preclude SARS-Cov-2 infection and should not be used as the sole basis for treatment or other patient management decisions. A negative result may occur with  improper specimen collection/handling, submission of specimen other than nasopharyngeal swab, presence of viral mutation(s) within the areas targeted by this assay, and inadequate number of viral copies (<131 copies/mL). A negative result must be combined with clinical observations, patient history, and epidemiological information. The expected result is Negative. Fact Sheet for Patients:  PinkCheek.be Fact Sheet for Healthcare Providers:  GravelBags.it This test is not yet ap proved or cleared by the [place] FDA and  has been authorized for detection and/or diagnosis of SARS-CoV-2 by FDA under an Emergency Use Authorization (EUA). This EUA will remain  in effect (meaning this test can be used) for the duration of the COVID-19 declaration under Section 564(b)(1) of the Act, 21 U.S.C. section 360bbb-3(b)(1), unless the authorization is terminated or revoked sooner.    Influenza A by PCR NEGATIVE NEGATIVE Final   Influenza B by PCR NEGATIVE NEGATIVE Final    Comment: (NOTE) The Xpert Xpress SARS-CoV-2/FLU/RSV assay is intended as an aid in  the diagnosis of influenza from Nasopharyngeal swab specimens and  should not be used as a sole basis for treatment. Nasal washings and  aspirates are unacceptable for Xpert Xpress SARS-CoV-2/FLU/RSV  testing. Fact  Sheet for Patients: PinkCheek.be Fact Sheet for Healthcare Providers: GravelBags.it This test is not yet approved or cleared by the [place] FDA and  has been authorized for detection and/or diagnosis of SARS-CoV-2 by  FDA under an Emergency Use Authorization (EUA). This EUA will remain  in effect (meaning this test can be used) for the duration of the  Covid-19 declaration under Section 564(b)(1) of the Act, 21  U.S.C. section 360bbb-3(b)(1), unless the authorization is  terminated or revoked. Performed at [facility], [address] [place], [place] [address]     Procedures and diagnostic studies:  DG Chest Portable 1 View  Result Date: 10/29/2019 CLINICAL DATA:  Shortness of breath since last night.  Wheezing. EXAM: PORTABLE CHEST 1 VIEW COMPARISON:  08/29/2018 FINDINGS: Lungs are hyperexpanded. The lungs are clear without focal pneumonia, edema, pneumothorax or pleural effusion. The cardiopericardial silhouette is within normal limits for size. The visualized bony structures of the thorax are intact. Telemetry leads overlie the chest. IMPRESSION: No active disease. Electronically Signed  By: Kennith Center M.D.   On: 10/29/2019 18:00    Medications:   . enoxaparin (LOVENOX) injection  40 mg Subcutaneous Q24H  . ipratropium-albuterol  3 mL Nebulization Q6H  . methylPREDNISolone (SOLU-MEDROL) injection  40 mg Intravenous Q6H   Followed by  . [START ON 10/31/2019] predniSONE  40 mg Oral Q breakfast  . sodium chloride flush  3 mL Intravenous Q12H   Continuous Infusions: . sodium chloride 75 mL/hr at 10/30/19 0909  . cefTRIAXone (ROCEPHIN)  IV Stopped (10/30/19 0139)     LOS: 0 days   Donavin Audino  [facility]   *Please refer to [email or web address], password [email or web address] to get updated schedule on who will round on this patient, as hospitalists switch teams weekly. If 7PM-7AM, please contact night-coverage at  [email or web address], password [email or web address] for any overnight needs.  10/30/2019, 11:56 AM

## 2019-10-31 LAB — HEMOGLOBIN A1C
Hgb A1c MFr Bld: 6.3 % — ABNORMAL HIGH (ref 4.8–5.6)
Mean Plasma Glucose: 134.11 mg/dL

## 2019-10-31 LAB — GLUCOSE, CAPILLARY
Glucose-Capillary: 165 mg/dL — ABNORMAL HIGH (ref 70–99)
Glucose-Capillary: 134 mg/dL — ABNORMAL HIGH (ref 70–99)

## 2019-10-31 LAB — PROCALCITONIN: Procalcitonin: <0.1 ng/mL

## 2019-10-31 MED ORDER — GUAIFENESIN-DM 100-10 MG/5ML PO SYRP
5.0000 mL | ORAL_SOLUTION | ORAL | Status: DC | PRN
  Administered 2019-10-31: 5 mL via ORAL
  Filled 2019-10-31: qty 5

## 2019-10-31 MED ORDER — BRIMONIDINE TARTRATE 0.2 % OP SOLN
1.0000 [drp] | Freq: Every day | OPHTHALMIC | Status: DC
  Administered 2019-10-31: 1 [drp] via OPHTHALMIC
  Filled 2019-10-31: qty 5

## 2019-10-31 MED ORDER — PANTOPRAZOLE SODIUM 20 MG PO TBEC
20.0000 mg | DELAYED_RELEASE_TABLET | Freq: Every day | ORAL | Status: DC
  Administered 2019-10-31 – 2019-11-01 (×2): 20 mg via ORAL
  Filled 2019-10-31 (×2): qty 1

## 2019-10-31 MED ORDER — POLYVINYL ALCOHOL 1.4 % OP SOLN
1.0000 [drp] | Freq: Four times a day (QID) | OPHTHALMIC | Status: DC | PRN
  Filled 2019-10-31: qty 15

## 2019-10-31 MED ORDER — MOMETASONE FURO-FORMOTEROL FUM 100-5 MCG/ACT IN AERO
1.0000 | INHALATION_SPRAY | Freq: Two times a day (BID) | RESPIRATORY_TRACT | Status: DC
  Administered 2019-11-01: 1 via RESPIRATORY_TRACT
  Filled 2019-10-31: qty 8.8

## 2019-10-31 MED ORDER — BRINZOLAMIDE 1 % OP SUSP
1.0000 [drp] | Freq: Every day | OPHTHALMIC | Status: DC
  Administered 2019-10-31: 1 [drp] via OPHTHALMIC
  Filled 2019-10-31: qty 10

## 2019-10-31 MED ORDER — NON FORMULARY: 1.0000 [drp] | Freq: Every day | Status: DC

## 2019-10-31 MED ORDER — INSULIN ASPART 100 UNIT/ML ~~LOC~~ SOLN
0.0000 [IU] | Freq: Three times a day (TID) | SUBCUTANEOUS | Status: DC
  Filled 2019-10-31: qty 1

## 2019-10-31 MED ORDER — TAMSULOSIN HCL 0.4 MG PO CAPS
0.4000 mg | ORAL_CAPSULE | Freq: Every day | ORAL | Status: DC
  Administered 2019-11-01: 0.4 mg via ORAL
  Filled 2019-10-31: qty 1

## 2019-10-31 NOTE — Progress Notes (Addendum)
Progress Note    ONUR MORI  ZOX:[id number] DOB: Feb 24, 1956  DOA: 10/29/2019 PCP: Patient, No Pcp Per      Brief Narrative:    Medical records reviewed and are as summarized below:  LUAN MABERRY is an 63 y.o. male with history of COPD, tobacco abuse (quit smoking 6 months ago), GERD, presented to the hospital with productive cough, shortness of breath and wheezing.  He required BiPAP briefly in the emergency room for acute respiratory distress.  He was admitted to the hospital for COPD exacerbation.      Assessment/Plan:   Principal Problem:   COPD with acute exacerbation (HCC) Active Problems:   COPD exacerbation (HCC)   COPD (chronic obstructive pulmonary disease) (HCC)   Hyperglycemia   Body mass index is 26.74 kg/m.  COPD exacerbation: Continue IV Rocephin, steroids and bronchodilators.  Robitussin as needed for cough.  Hyperglycemia/prediabetes: Hemoglobin A1c 6.3.  NovoLog as needed for hyperglycemia.      Family Communication/Anticipated D/C date and plan/Code Status   DVT prophylaxis: Lovenox Code Status: Full code Family Communication: Plan discussed with the patient Disposition Plan: For discharge to home in 1 to 2 days     Subjective:   He still has a cough and significant wheezing.  He feels short of breath with minimal exertion.  Objective:    Vitals:   10/30/19 2313 10/31/19 0802 10/31/19 0816 10/31/19 1405  BP: 140/76  (!) 159/89   Pulse: 70  75   Resp: 18     Temp: 98.3 F (36.8 C)  97.7 F (36.5 C)   TempSrc: Oral  Oral   SpO2: 97% 94% 96% 95%  Weight:      Height:        Intake/Output Summary (Last 24 hours) at 10/31/2019 1509 Last data filed at 10/31/2019 1300 Gross per 24 hour  Intake 1494.89 ml  Output 0 ml  Net 1494.89 ml   Filed Weights   10/30/19 0020  Weight: 77.4 kg    Exam:  GEN: NAD SKIN: No rash EYES: EOMI ENT: MMM CV: RRR PULM: Reduced air entry bilaterally, significant bilateral  wheezing, no rales heard ABD: soft, ND, NT, +BS CNS: AAO x 3, non focal EXT: No edema or tenderness    Data Reviewed:   I have personally reviewed following labs and imaging studies:  Labs: Labs show the following:   Basic Metabolic Panel: Recent Labs  Lab 10/29/19 1717 10/30/19 0458  NA 142 136  K 3.8 4.3  CL 102 101  CO2 25 25  GLUCOSE 176* 234*  BUN 15 16  CREATININE 0.89 0.71  CALCIUM 9.1 8.5*   GFR Estimated Creatinine Clearance: 89.5 mL/min (by C-G formula based on SCr of 0.71 mg/dL). Liver Function Tests: No results for input(s): AST, ALT, ALKPHOS, BILITOT, PROT, ALBUMIN in the last 168 hours. No results for input(s): LIPASE, AMYLASE in the last 168 hours. No results for input(s): AMMONIA in the last 168 hours. Coagulation profile No results for input(s): INR, PROTIME in the last 168 hours.  CBC: Recent Labs  Lab 10/29/19 1717 10/30/19 0458  WBC 7.0 7.1  HGB 14.9 14.1  HCT 43.9 41.4  MCV 89.2 88.8  PLT 213 201   Cardiac Enzymes: No results for input(s): CKTOTAL, CKMB, CKMBINDEX, TROPONINI in the last 168 hours. BNP (last 3 results) No results for input(s): PROBNP in the last 8760 hours. CBG: No results for input(s): GLUCAP in the last 168 hours. D-Dimer: No results  for input(s): DDIMER in the last 72 hours. Hgb A1c: Recent Labs    10/31/19 0649  HGBA1C 6.3*   Lipid Profile: No results for input(s): CHOL, HDL, LDLCALC, TRIG, CHOLHDL, LDLDIRECT in the last 72 hours. Thyroid function studies: No results for input(s): TSH, T4TOTAL, T3FREE, THYROIDAB in the last 72 hours.  Invalid input(s): FREET3 Anemia work up: No results for input(s): VITAMINB12, FOLATE, FERRITIN, TIBC, IRON, RETICCTPCT in the last 72 hours. Sepsis Labs: Recent Labs  Lab 10/29/19 1717 10/30/19 0458 10/31/19 0649  PROCALCITON <0.10 <0.10 <0.10  WBC 7.0 7.1  --     Microbiology Recent Results (from the past 240 hour(s))  Respiratory Panel by RT PCR (Flu A&B, Covid)  - Nasopharyngeal Swab     Status: None   Collection Time: 10/29/19  5:55 PM   Specimen: Nasopharyngeal Swab  Result Value Ref Range Status   SARS Coronavirus 2 by RT PCR NEGATIVE NEGATIVE Final    Comment: (NOTE) SARS-CoV-2 target nucleic acids are NOT DETECTED. The SARS-CoV-2 RNA is generally detectable in upper respiratoy specimens during the acute phase of infection. The lowest concentration of SARS-CoV-2 viral copies this assay can detect is 131 copies/mL. A negative result does not preclude SARS-Cov-2 infection and should not be used as the sole basis for treatment or other patient management decisions. A negative result may occur with  improper specimen collection/handling, submission of specimen other than nasopharyngeal swab, presence of viral mutation(s) within the areas targeted by this assay, and inadequate number of viral copies (<131 copies/mL). A negative result must be combined with clinical observations, patient history, and epidemiological information. The expected result is Negative. Fact Sheet for Patients:  PinkCheek.be Fact Sheet for Healthcare Providers:  GravelBags.it This test is not yet ap proved or cleared by the [place] FDA and  has been authorized for detection and/or diagnosis of SARS-CoV-2 by FDA under an Emergency Use Authorization (EUA). This EUA will remain  in effect (meaning this test can be used) for the duration of the COVID-19 declaration under Section 564(b)(1) of the Act, 21 U.S.C. section 360bbb-3(b)(1), unless the authorization is terminated or revoked sooner.    Influenza A by PCR NEGATIVE NEGATIVE Final   Influenza B by PCR NEGATIVE NEGATIVE Final    Comment: (NOTE) The Xpert Xpress SARS-CoV-2/FLU/RSV assay is intended as an aid in  the diagnosis of influenza from Nasopharyngeal swab specimens and  should not be used as a sole basis for treatment. Nasal washings and    aspirates are unacceptable for Xpert Xpress SARS-CoV-2/FLU/RSV  testing. Fact Sheet for Patients: PinkCheek.be Fact Sheet for Healthcare Providers: GravelBags.it This test is not yet approved or cleared by the [place] FDA and  has been authorized for detection and/or diagnosis of SARS-CoV-2 by  FDA under an Emergency Use Authorization (EUA). This EUA will remain  in effect (meaning this test can be used) for the duration of the  Covid-19 declaration under Section 564(b)(1) of the Act, 21  U.S.C. section 360bbb-3(b)(1), unless the authorization is  terminated or revoked. Performed at [facility], [address] [place], [place] [address]     Procedures and diagnostic studies:  DG Chest Portable 1 View  Result Date: 10/29/2019 CLINICAL DATA:  Shortness of breath since last night.  Wheezing. EXAM: PORTABLE CHEST 1 VIEW COMPARISON:  08/29/2018 FINDINGS: Lungs are hyperexpanded. The lungs are clear without focal pneumonia, edema, pneumothorax or pleural effusion. The cardiopericardial silhouette is within normal limits for size. The visualized bony structures of  the thorax are intact. Telemetry leads overlie the chest. IMPRESSION: No active disease. Electronically Signed   By: Kennith CenterEric  Mansell M.D.   On: 10/29/2019 18:00    Medications:   . enoxaparin (LOVENOX) injection  40 mg Subcutaneous Q24H  . insulin aspart  0-9 Units Subcutaneous TID WC  . ipratropium-albuterol  3 mL Nebulization Q6H  . pantoprazole  20 mg Oral Daily  . predniSONE  40 mg Oral Q breakfast  . sodium chloride flush  3 mL Intravenous Q12H  . [START ON 11/01/2019] tamsulosin  0.4 mg Oral QPC breakfast   Continuous Infusions: . cefTRIAXone (ROCEPHIN)  IV 1 g (10/31/19 0045)     LOS: 1 day   Braelyn Bordonaro  [facility]   *Please refer to [email or web address], password [email or web address] to get updated schedule on who will round on this patient, as  hospitalists switch teams weekly. If 7PM-7AM, please contact night-coverage at [email or web address], password [email or web address] for any overnight needs.  10/31/2019, 3:09 PM

## 2019-10-31 NOTE — TOC Initial Note (Signed)
Transition of Care [facility]) - Initial/Assessment Note    Patient Details  Name: Bryan Kelley MRN: [id number] Date of Birth: 1956-03-01  [facility] CM/SW Contact:    Su Hilt, RN Phone Number: 10/31/2019, 9:51 AM  Clinical Narrative:                  I have tried calling into the room several times, the phone remains busy, I spoke with [facility] from [facility] and they are unable to accept the patient. I have reached out to [facility], [facility] and [facility] as well, [facility] is unable to accept the patient       Patient Goals and CMS Choice        Expected Discharge Plan and Services                                                Prior Living Arrangements/Services                       Activities of Daily Living Home Assistive Devices/Equipment: None ADL Screening (condition at time of admission) Patient's cognitive ability adequate to safely complete daily activities?: Yes Is the patient deaf or have difficulty hearing?: Yes Does the patient have difficulty seeing, even when wearing glasses/contacts?: Yes(left eye vision loss) Does the patient have difficulty concentrating, remembering, or making decisions?: No Patient able to express need for assistance with ADLs?: Yes Does the patient have difficulty dressing or bathing?: No Independently performs ADLs?: Yes (appropriate for developmental age) Does the patient have difficulty walking or climbing stairs?: Yes Weakness of Legs: None Weakness of Arms/Hands: None  Permission Sought/Granted                  Emotional Assessment              Admission diagnosis:  COPD exacerbation ([place]) [J44.1] COPD (chronic obstructive pulmonary disease) ([place]) [J44.9] Patient Active Problem List   Diagnosis Date Noted  . COPD (chronic obstructive pulmonary disease) ([place]) 10/30/2019  . Hyperglycemia   . COPD with acute exacerbation ([place]) 10/29/2019  . COPD exacerbation ([place]) 10/29/2019   . Epigastric abdominal pain 08/12/2012  . Alcohol abuse, daily use 03/14/2012  . Elevated BP 12/07/2011  . ED (erectile dysfunction) 07/28/2011  . NICOTINE ADDICTION 10/29/2009  . ACUTE BRONCHITIS 05/07/2009  . 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 ANKLE PAIN, RIGHT 10/04/2008  . FATIGUE 10/04/2008  . ANXIETY 02/17/2008  . ASTHMA 02/17/2008  . GERD 02/17/2008  . BENIGN PROSTATIC HYPERTROPHY, HX OF 02/17/2008   PCP:  Patient, No Pcp Per Pharmacy:   [place], [place] - [address] [address] Phone: [phone] Fax: [phone]  [place], [place], [place] [address] Phone: [phone] Fax: [phone]     Social Determinants of Health (SDOH) Interventions    Readmission Risk Interventions No flowsheet data found.

## 2019-10-31 NOTE — Progress Notes (Signed)
Nutrition Brief Note  RD received consult for assessment of nutrition requirements/status per COPD protocol  Wt Readings from Last 15 Encounters:  10/30/19 77.4 kg  08/12/12 69 kg  03/09/12 68.7 kg  12/03/11 70.9 kg  07/28/11 75.4 kg  11/21/10 69.4 kg    Patient reports his appetite is good and unchanged from baseline. He is eating 100% of meals here. He reports eating 3 meals per day at home. He denies any unintentional weight loss and reports he is weight-stable. He reports he weighs around 167 lbs. Current weight in chart is 77.4 kg (170.7 lbs). Patient does not met criteria for malnutrition at this time.  Body mass index is 26.74 kg/m. Patient meets criteria for overweight based on current BMI.   Current diet order is regular diet, patient is consuming approximately 100% of meals at this time. Labs and medications reviewed.   No nutrition interventions warranted at this time. If nutrition issues arise, please consult RD.   Jacklynn Barnacle, MS, RD, LDN Office: [phone] Pager: [phone] After Hours/Weekend Pager: [phone]

## 2019-10-31 NOTE — TOC Progression Note (Signed)
Transition of Care [facility]) - Progression Note    Patient Details  Name: Bryan Kelley MRN: [id number] Date of Birth: [birth year]  Transition of Care [facility]) CM/SW [place], RN Phone Number: 10/31/2019, 3:38 PM  Clinical Narrative:     Met with the patient at the bedside to discuss DC plan and needs  He does not want [place] services and says he does not need it, he also said he only needs a nebulizer machine but no other DME, He uses Glaucoma drops at home and has not had any for three days, he does not know the name of it, I let him know I would notify the doctor       Expected Discharge Plan and Services                                                 Social Determinants of Health (SDOH) Interventions    Readmission Risk Interventions No flowsheet data found.

## 2019-11-01 LAB — GLUCOSE, CAPILLARY: Glucose-Capillary: 101 mg/dL — ABNORMAL HIGH (ref 70–99)

## 2019-11-01 MED ORDER — GUAIFENESIN-DM 100-10 MG/5ML PO SYRP: 5.0000 mL | ORAL_SOLUTION | Freq: Four times a day (QID) | ORAL | 0 refills | Status: DC | PRN

## 2019-11-01 MED ORDER — BRINZOLAMIDE-BRIMONIDINE 1-0.2 % OP SUSP: 1.0000 [drp] | Freq: Every day | OPHTHALMIC | Status: DC

## 2019-11-01 MED ORDER — IPRATROPIUM-ALBUTEROL 0.5-2.5 (3) MG/3ML IN SOLN: 3.0000 mL | Freq: Four times a day (QID) | RESPIRATORY_TRACT | 0 refills | Status: DC | PRN

## 2019-11-01 MED ORDER — PREDNISONE 20 MG PO TABS: 40.0000 mg | ORAL_TABLET | Freq: Every day | ORAL | 0 refills | Status: AC

## 2019-11-01 NOTE — Progress Notes (Signed)
Discharge instructions reviewed with patient. He verbalized understanding. IV removed. Vitals stable. Patient wanted to [facility] [place] medical mall where his ride will be picking him up.

## 2019-11-01 NOTE — Discharge Summary (Signed)
Physician Discharge Summary  Bryan Kelley PPI:[id number] DOB: 1956-01-14 DOA: 10/29/2019  PCP: Patient, No Pcp Per  Admit date: 10/29/2019 Discharge date: 11/01/2019  Discharge disposition: Home   Recommendations for Outpatient Follow-Up:    Outpatient follow-up with PCP in 1 to 2 weeks   Discharge Diagnosis:   Principal Problem:   COPD with acute exacerbation ([place]) Active Problems:   COPD exacerbation ([place])   COPD (chronic obstructive pulmonary disease) ([place])   Hyperglycemia    Discharge Condition: Stable.  Diet recommendation: Low-salt, diabetic diet  Code status: Full code.    Hospital Course:    Bryan Kelley is an 63 y.o. male with history of COPD, tobacco abuse (quit smoking 6 months ago), GERD, presented to the hospital with productive cough, shortness of breath and wheezing.  He required BiPAP briefly in the emergency room for acute respiratory distress.  He was admitted to the hospital for COPD exacerbation.  He was treated with empiric IV ceftriaxone, steroids and bronchodilators.  His condition slowly improved and he is deemed stable for discharge to home today.  Patient said he does not own a nebulizer at home and sometimes he uses his sister's nebulizer.  He he has been prescribed a nebulizer machine and refills for DuoNeb was given.  Patient was also found to have prediabetes HbA1c of 6.3.  His blood pressure was elevated and is not clear whether this is from acute illness/steroid use.  He has been counseled about positive lifestyle changes including weight loss and eating healthy low sugar and low salt diet.  Follow-up with PCP for was strongly recommended.     Discharge Exam:   Vitals:   11/01/19 0736 11/01/19 0811  BP:  (!) 151/98  Pulse:  61  Resp:  18  Temp:  97.8 F (36.6 C)  SpO2: 96% 92%   Vitals:   10/31/19 2025 10/31/19 2311 11/01/19 0736 11/01/19 0811  BP:  (!) 153/86  (!) 151/98  Pulse:  (!) 59  61  Resp:  18  18  Temp:  98.1  F (36.7 C)  97.8 F (36.6 C)  TempSrc:  Oral  Oral  SpO2: 97% 98% 96% 92%  Weight:      Height:         GEN: NAD SKIN: No rash EYES: Anicteric ENT: MMM CV: RRR PULM: Air entry adequate bilaterally, mild bilateral expiratory wheezing, no rales heard ABD: soft, ND, NT, +BS CNS: AAO x 3, non focal EXT: No edema or tenderness   The results of significant diagnostics from this hospitalization (including imaging, microbiology, ancillary and laboratory) are listed below for reference.     Procedures and Diagnostic Studies:   DG Chest Portable 1 View  Result Date: 10/29/2019 CLINICAL DATA:  Shortness of breath since last night.  Wheezing. EXAM: PORTABLE CHEST 1 VIEW COMPARISON:  08/29/2018 FINDINGS: Lungs are hyperexpanded. The lungs are clear without focal pneumonia, edema, pneumothorax or pleural effusion. The cardiopericardial silhouette is within normal limits for size. The visualized bony structures of the thorax are intact. Telemetry leads overlie the chest. IMPRESSION: No active disease. Electronically Signed   By: Misty Stanley M.D.   On: 10/29/2019 18:00     Labs:   Basic Metabolic Panel: Recent Labs  Lab 10/29/19 1717 10/30/19 0458  NA 142 136  K 3.8 4.3  CL 102 101  CO2 25 25  GLUCOSE 176* 234*  BUN 15 16  CREATININE 0.89 0.71  CALCIUM 9.1 8.5*   GFR Estimated  Creatinine Clearance: 89.5 mL/min (by C-G formula based on SCr of 0.71 mg/dL). Liver Function Tests: No results for input(s): AST, ALT, ALKPHOS, BILITOT, PROT, ALBUMIN in the last 168 hours. No results for input(s): LIPASE, AMYLASE in the last 168 hours. No results for input(s): AMMONIA in the last 168 hours. Coagulation profile No results for input(s): INR, PROTIME in the last 168 hours.  CBC: Recent Labs  Lab 10/29/19 1717 10/30/19 0458  WBC 7.0 7.1  HGB 14.9 14.1  HCT 43.9 41.4  MCV 89.2 88.8  PLT 213 201   Cardiac Enzymes: No results for input(s): CKTOTAL, CKMB, CKMBINDEX, TROPONINI  in the last 168 hours. BNP: Invalid input(s): POCBNP CBG: Recent Labs  Lab 10/31/19 1659 10/31/19 2154 11/01/19 0810  GLUCAP 165* 134* 101*   D-Dimer No results for input(s): DDIMER in the last 72 hours. Hgb A1c Recent Labs    10/31/19 0649  HGBA1C 6.3*   Lipid Profile No results for input(s): CHOL, HDL, LDLCALC, TRIG, CHOLHDL, LDLDIRECT in the last 72 hours. Thyroid function studies No results for input(s): TSH, T4TOTAL, T3FREE, THYROIDAB in the last 72 hours.  Invalid input(s): FREET3 Anemia work up No results for input(s): VITAMINB12, FOLATE, FERRITIN, TIBC, IRON, RETICCTPCT in the last 72 hours. Microbiology Recent Results (from the past 240 hour(s))  Respiratory Panel by RT PCR (Flu A&B, Covid) - Nasopharyngeal Swab     Status: None   Collection Time: 10/29/19  5:55 PM   Specimen: Nasopharyngeal Swab  Result Value Ref Range Status   SARS Coronavirus 2 by RT PCR NEGATIVE NEGATIVE Final    Comment: (NOTE) SARS-CoV-2 target nucleic acids are NOT DETECTED. The SARS-CoV-2 RNA is generally detectable in upper respiratoy specimens during the acute phase of infection. The lowest concentration of SARS-CoV-2 viral copies this assay can detect is 131 copies/mL. A negative result does not preclude SARS-Cov-2 infection and should not be used as the sole basis for treatment or other patient management decisions. A negative result may occur with  improper specimen collection/handling, submission of specimen other than nasopharyngeal swab, presence of viral mutation(s) within the areas targeted by this assay, and inadequate number of viral copies (<131 copies/mL). A negative result must be combined with clinical observations, patient history, and epidemiological information. The expected result is Negative. Fact Sheet for Patients:  https://www.moore.com/https://www.fda.gov/media/142436/download Fact Sheet for Healthcare Providers:  https://www.young.biz/https://www.fda.gov/media/142435/download This test is not yet ap  proved or cleared by the [place] FDA and  has been authorized for detection and/or diagnosis of SARS-CoV-2 by FDA under an Emergency Use Authorization (EUA). This EUA will remain  in effect (meaning this test can be used) for the duration of the COVID-19 declaration under Section 564(b)(1) of the Act, 21 U.S.C. section 360bbb-3(b)(1), unless the authorization is terminated or revoked sooner.    Influenza A by PCR NEGATIVE NEGATIVE Final   Influenza B by PCR NEGATIVE NEGATIVE Final    Comment: (NOTE) The Xpert Xpress SARS-CoV-2/FLU/RSV assay is intended as an aid in  the diagnosis of influenza from Nasopharyngeal swab specimens and  should not be used as a sole basis for treatment. Nasal washings and  aspirates are unacceptable for Xpert Xpress SARS-CoV-2/FLU/RSV  testing. Fact Sheet for Patients: https://www.moore.com/https://www.fda.gov/media/142436/download Fact Sheet for Healthcare Providers: https://www.young.biz/https://www.fda.gov/media/142435/download This test is not yet approved or cleared by the [place] FDA and  has been authorized for detection and/or diagnosis of SARS-CoV-2 by  FDA under an Emergency Use Authorization (EUA). This EUA will remain  in effect (meaning this test  can be used) for the duration of the  Covid-19 declaration under Section 564(b)(1) of the Act, 21  U.S.C. section 360bbb-3(b)(1), unless the authorization is  terminated or revoked. Performed at [facility], [address], [place], [place] [address]      Discharge Instructions:   Discharge Instructions    Diet - low sodium heart healthy   Complete by: As directed    Diet Carb Modified   Complete by: As directed    For home use only DME Nebulizer machine   Complete by: As directed    Patient needs a nebulizer to treat with the following condition: COPD (chronic obstructive pulmonary disease) (HCC)   Length of Need: Lifetime   Increase activity slowly   Complete by: As directed      Allergies as of  11/01/2019   No Known Allergies     Medication List    STOP taking these medications   beclomethasone 40 MCG/ACT inhaler Commonly known as: Qvar   mometasone-formoterol 100-5 MCG/ACT Aero Commonly known as: DULERA     TAKE these medications   albuterol 108 (90 Base) MCG/ACT inhaler Commonly known as: Proventil HFA Inhale 2 puffs into the lungs every 6 (six) hours as needed for wheezing.   Artificial Tears 0.1-0.3 % Soln Generic drug: Dextran 70-Hypromellose Apply 1 drop to eye 4 (four) times daily as needed. In both eyes for dry eyes.   Brinzolamide-Brimonidine 1-0.2 % Susp Apply 1 drop to eye daily. Into left eye. What changed: when to take this   guaiFENesin-dextromethorphan 100-10 MG/5ML syrup Commonly known as: ROBITUSSIN DM Take 5 mLs by mouth every 6 (six) hours as needed for cough.   ipratropium 17 MCG/ACT inhaler Commonly known as: ATROVENT HFA Inhale 2 puffs into the lungs 3 (three) times daily.   ipratropium-albuterol 0.5-2.5 (3) MG/3ML Soln Commonly known as: DUONEB Take 3 mLs by nebulization every 6 (six) hours as needed. What changed: when to take this   omeprazole 20 MG capsule Commonly known as: PRILOSEC Take 1 capsule (20 mg total) by mouth daily.   predniSONE 20 MG tablet Commonly known as: DELTASONE Take 2 tablets (40 mg total) by mouth daily with breakfast for 3 days.   Symbicort 160-4.5 MCG/ACT inhaler Generic drug: budesonide-formoterol Inhale 2 puffs into the lungs 2 (two) times daily.   tamsulosin 0.4 MG Caps capsule Commonly known as: FLOMAX Take 0.4 mg by mouth daily with breakfast. Take 30 minutes after breakfast.            Durable Medical Equipment  (From admission, onward)         Start     Ordered   10/31/19 0000  For home use only DME Nebulizer machine    Question Answer Comment  Patient needs a nebulizer to treat with the following condition COPD (chronic obstructive pulmonary disease) (HCC)   Length of Need  Lifetime      10/31/19 1604            Time coordinating discharge: 25 minutes  Signed:  Bathsheba Durrett  [facility] 11/01/2019, 9:51 AM

## 2019-11-29 ENCOUNTER — Emergency Department: Payer: Medicaid Other

## 2019-11-29 ENCOUNTER — Emergency Department
Admission: EM | Admit: 2019-11-29 | Discharge: 2019-11-30 | Disposition: A | Discharge status: Home / Self Care | Payer: Medicaid Other | Attending: Emergency Medicine | Admitting: Emergency Medicine

## 2019-11-29 ENCOUNTER — Encounter: Payer: Self-pay | Admitting: Emergency Medicine

## 2019-11-29 ENCOUNTER — Other Ambulatory Visit: Payer: Self-pay

## 2019-11-29 DIAGNOSIS — R0602 Shortness of breath: Secondary | ICD-10-CM | POA: Diagnosis present

## 2019-11-29 DIAGNOSIS — I10 Essential (primary) hypertension: Secondary | ICD-10-CM | POA: Insufficient documentation

## 2019-11-29 DIAGNOSIS — Z79899 Other long term (current) drug therapy: Secondary | ICD-10-CM | POA: Insufficient documentation

## 2019-11-29 DIAGNOSIS — Z87891 Personal history of nicotine dependence: Secondary | ICD-10-CM | POA: Diagnosis not present

## 2019-11-29 DIAGNOSIS — J40 Bronchitis, not specified as acute or chronic: Secondary | ICD-10-CM | POA: Diagnosis not present

## 2019-11-29 DIAGNOSIS — R062 Wheezing: Secondary | ICD-10-CM | POA: Diagnosis not present

## 2019-11-29 DIAGNOSIS — J449 Chronic obstructive pulmonary disease, unspecified: Secondary | ICD-10-CM | POA: Diagnosis not present

## 2019-11-29 LAB — CBC
HCT: 42.2 % (ref 39.0–52.0)
Hemoglobin: 14.3 g/dL (ref 13.0–17.0)
MCH: 30.6 pg (ref 26.0–34.0)
MCHC: 33.9 g/dL (ref 30.0–36.0)
MCV: 90.2 fL (ref 80.0–100.0)
Platelets: 271 10*3/uL (ref 150–400)
RBC: 4.68 MIL/uL (ref 4.22–5.81)
RDW: 13.6 % (ref 11.5–15.5)
WBC: 7.7 10*3/uL (ref 4.0–10.5)
nRBC: 0 % (ref 0.0–0.2)

## 2019-11-29 LAB — TROPONIN I (HIGH SENSITIVITY): Troponin I (High Sensitivity): 7 ng/L (ref <18)

## 2019-11-29 LAB — BASIC METABOLIC PANEL
Anion gap: 8 (ref 5–15)
BUN: 15 mg/dL (ref 8–23)
CO2: 27 mmol/L (ref 22–32)
Calcium: 8.7 mg/dL — ABNORMAL LOW (ref 8.9–10.3)
Chloride: 107 mmol/L (ref 98–111)
Creatinine, Ser: 0.86 mg/dL (ref 0.61–1.24)
GFR calc Af Amer: >60 mL/min (ref >60)
GFR calc non Af Amer: >60 mL/min (ref >60)
Glucose, Bld: 110 mg/dL — ABNORMAL HIGH (ref 70–99)
Potassium: 3.9 mmol/L (ref 3.5–5.1)
Sodium: 142 mmol/L (ref 135–145)

## 2019-11-29 MED ORDER — MAGNESIUM SULFATE 2 GM/50ML IV SOLN
2.0000 g | Freq: Once | INTRAVENOUS | Status: AC
  Administered 2019-11-29: 2 g via INTRAVENOUS
  Filled 2019-11-29: qty 50

## 2019-11-29 MED ORDER — IPRATROPIUM-ALBUTEROL 0.5-2.5 (3) MG/3ML IN SOLN
3.0000 mL | Freq: Once | RESPIRATORY_TRACT | Status: AC
  Administered 2019-11-29: 3 mL via RESPIRATORY_TRACT
  Filled 2019-11-29: qty 3

## 2019-11-29 MED ORDER — METHYLPREDNISOLONE SODIUM SUCC 125 MG IJ SOLR
125.0000 mg | Freq: Once | INTRAMUSCULAR | Status: AC
  Administered 2019-11-29: 125 mg via INTRAVENOUS
  Filled 2019-11-29: qty 2

## 2019-11-29 MED ORDER — ALBUTEROL SULFATE (2.5 MG/3ML) 0.083% IN NEBU
5.0000 mg | INHALATION_SOLUTION | Freq: Once | RESPIRATORY_TRACT | Status: AC
  Administered 2019-11-29: 5 mg via RESPIRATORY_TRACT
  Filled 2019-11-29: qty 6

## 2019-11-29 NOTE — ED Triage Notes (Signed)
Pt to ED from home c/o SOB that started suddenly today, states brown productive cough.  Hx of COPD and asthma, denies fevers, denies pain.  States took an albuterol before EMS arrival without relief.  States had a negative COVID test two days ago.

## 2019-11-29 NOTE — ED Notes (Signed)
Pt reports sob for 1 days.  Intermittent chest pain.  Former smoker  Hx copd.   No cough or fever.   No n/v/d.  No diaphoresis.  pt alert.

## 2019-11-29 NOTE — ED Triage Notes (Signed)
Pt called for triage, no response. 

## 2019-11-29 NOTE — ED Notes (Signed)
While attempting to obtain VS from pt, this tech asked pt if he could try to be still for a moment so we could get an accurate reading. Pt then jumped up from wc and stated "look I am just trying to breathe do you understand that?" I proceeded to inform pt that I have taken his bp four times and that I can not get an accurate reading d/t him moving around in the chair so much.

## 2019-11-29 NOTE — ED Triage Notes (Signed)
Pt in via EMS from home. EMS reports pt with hx of COPD and asthma. O2 sats 94% RA, placed on 2 L now 98%. Pt with ETOH on board.

## 2019-11-29 NOTE — ED Provider Notes (Signed)
[facility] Emergency Department Provider Note  ____________________________________________  Time seen: Approximately 11:47 PM  I have reviewed the triage vital signs and the nursing notes.   HISTORY  Chief Complaint Shortness of Breath   HPI Bryan Kelley is a 64 y.o. male with a history of asthma, COPD, depression, GERD, hypertension who presents for evaluation of shortness of breath.  Patient reports that his wife tested positive for Covid last week.  He was tested 2 days ago and his result was negative.  Today started having shortness of breath after couple of days of cough productive of yellow sputum.  Has had wheezing.  The shortness of breath is present constantly but worse with ambulation.  Has not uses inhaler at home.  He denies fever, sore throat, loss of taste or smell, body aches, vomiting or diarrhea.  He is also complaining of sharp right-sided chest pain that started earlier today.  He denies any personal or family history of PE or DVT, no recent travel immobilization, no leg pain or swelling, no hemoptysis or exogenous hormones.   Past Medical History:  Diagnosis Date  . Asthma   . Depression   . GERD (gastroesophageal reflux disease)   . Vision loss, left eye     Patient Active Problem List   Diagnosis Date Noted  . COPD (chronic obstructive pulmonary disease) (HCC) 10/30/2019  . Hyperglycemia   . COPD with acute exacerbation (HCC) 10/29/2019  . COPD exacerbation (HCC) 10/29/2019  . Epigastric abdominal pain 08/12/2012  . Alcohol abuse, daily use 03/14/2012  . Elevated BP 12/07/2011  . ED (erectile dysfunction) 07/28/2011  . NICOTINE ADDICTION 10/29/2009  . ACUTE BRONCHITIS 05/07/2009  . 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 ANKLE PAIN, RIGHT 10/04/2008  . FATIGUE 10/04/2008  . ANXIETY 02/17/2008  . ASTHMA 02/17/2008  . GERD 02/17/2008  . BENIGN PROSTATIC HYPERTROPHY, HX OF 02/17/2008    Past Surgical History:    Procedure Laterality Date  . EYE SURGERY  2009, about 1324,4010   steel in left eye on the job, legally blind     Prior to Admission medications   Medication Sig Start Date End Date Taking? Authorizing Provider  albuterol (VENTOLIN HFA) 108 (90 Base) MCG/ACT inhaler Inhale 2 puffs into the lungs every 6 (six) hours as needed for wheezing or shortness of breath. 11/30/19   Don Perking, [place], MD  azithromycin (ZITHROMAX Z-PAK) 250 MG tablet Take 2 tablets (500 mg) on  Day 1,  followed by 1 tablet (250 mg) once daily on Days 2 through 5. 11/30/19 12/05/19  Don Perking, [place], MD  Brinzolamide-Brimonidine 1-0.2 % SUSP Apply 1 drop to eye daily. Into left eye. 11/01/19   Lurene Shadow, MD  budesonide-formoterol [facility]) 160-4.5 MCG/ACT inhaler Inhale 2 puffs into the lungs 2 (two) times daily.    [provider]  Dextran 70-Hypromellose (ARTIFICIAL TEARS) 0.1-0.3 % SOLN Apply 1 drop to eye 4 (four) times daily as needed. In both eyes for dry eyes. 01/26/18   [provider]  guaiFENesin-dextromethorphan (ROBITUSSIN DM) 100-10 MG/5ML syrup Take 5 mLs by mouth every 6 (six) hours as needed for cough. 11/01/19   Lurene Shadow, MD  ipratropium (ATROVENT HFA) 17 MCG/ACT inhaler Inhale 2 puffs into the lungs 3 (three) times daily.    [provider]  ipratropium-albuterol (DUONEB) 0.5-2.5 (3) MG/3ML SOLN Take 3 mLs by nebulization every 6 (six) hours as needed. 11/01/19   Lurene Shadow, MD  omeprazole (PRILOSEC) 20 MG capsule Take 1 capsule (20  mg total) by mouth daily. 08/12/12   [place], Velna Hatchet, MD  predniSONE (DELTASONE) 20 MG tablet Take 3 tablets (60 mg total) by mouth daily for 4 days. 11/30/19 12/04/19  Nita Sickle, MD  tamsulosin (FLOMAX) 0.4 MG CAPS capsule Take 0.4 mg by mouth daily with breakfast. Take 30 minutes after breakfast.    [provider]    Allergies Patient has no known allergies.  Family History  Problem Relation Age of Onset  .  Diabetes Mother   . Hypertension Mother   . Stroke Mother   . Asthma Father   . Diabetes Father   . Diabetes Sister   . Hypertension Sister   . Diabetes Brother   . Hypertension Brother     Social History Social History   Tobacco Use  . Smoking status: Former Smoker    Packs/day: 0.50  . Smokeless tobacco: Never Used  Substance Use Topics  . Alcohol use: Yes    Comment: vodka on the weekends  . Drug use: No    Review of Systems  Constitutional: Negative for fever. Eyes: Negative for visual changes. ENT: Negative for sore throat. Neck: No neck pain  Cardiovascular: + chest pain. Respiratory: + shortness of breath, cough, wheezing Gastrointestinal: Negative for abdominal pain, vomiting or diarrhea. Genitourinary: Negative for dysuria. Musculoskeletal: Negative for back pain. Skin: Negative for rash. Neurological: Negative for headaches, weakness or numbness. Psych: No SI or HI  ____________________________________________   PHYSICAL EXAM:  VITAL SIGNS: Vitals:   11/30/19 0045 11/30/19 0100  BP:  (!) 164/96  Pulse: 69 78  Resp: 17 16  Temp:    SpO2: 96%    Constitutional: Alert and oriented. Well appearing and in no apparent distress. HEENT:      Head: Normocephalic and atraumatic.         Eyes: Conjunctivae are normal. Sclera is non-icteric.       Mouth/Throat: Mucous membranes are moist.       Neck: Supple with no signs of meningismus. Cardiovascular: Regular rate and rhythm. No murmurs, gallops, or rubs. 2+ symmetrical distal pulses are present in all extremities. No JVD. Respiratory: Increased work of breathing, tachypneic, diffuse wheezing bilaterally with decreased air movement gastrointestinal: Soft, non tender, and non distended with positive bowel sounds. No rebound or guarding. Musculoskeletal: Nontender with normal range of motion in all extremities. No edema, cyanosis, or erythema of extremities. Neurologic: Normal speech and language. Face is  symmetric. Moving all extremities. No gross focal neurologic deficits are appreciated. Skin: Skin is warm, dry and intact. No rash noted. Psychiatric: Mood and affect are normal. Speech and behavior are normal.  ____________________________________________   LABS (all labs ordered are listed, but only abnormal results are displayed)  Labs Reviewed  BASIC METABOLIC PANEL - Abnormal; Notable for the following components:      Result Value   Glucose, Bld 110 (*)    Calcium 8.7 (*)    All other components within normal limits  FIBRIN DERIVATIVES D-DIMER ([facility] ONLY) - Abnormal; Notable for the following components:   Fibrin derivatives D-dimer ([facility]) 643.81 (*)    All other components within normal limits  CBC  TROPONIN I (HIGH SENSITIVITY)  TROPONIN I (HIGH SENSITIVITY)   ____________________________________________  EKG  ED ECG REPORT I, Nita Sickle, the attending physician, personally viewed and interpreted this ECG.  Normal sinus rhythm, rate of 97, normal intervals, normal axis, no ST elevations or depressions.  Unchanged from prior. ____________________________________________  RADIOLOGY  I have personally reviewed  the images performed during this visit and I agree with the Radiologist's read.   Interpretation by Radiologist:  DG Chest 2 View  Result Date: 11/29/2019 CLINICAL DATA:  64 year old male with shortness of breath.  COPD. EXAM: CHEST - 2 VIEW COMPARISON:  Chest radiographs 10/29/2019 and earlier. FINDINGS: Chronic small metallic BB retained at the lower right anterior chest wall. Pulmonary hyperinflation appears not significantly changed since 2010. There is a degree of hilar retraction suggesting chronic upper lobe lung disease which has progressed since that time. Other mediastinal contours are within normal limits. Visualized tracheal air column is within normal limits. No pneumothorax, pulmonary edema, pleural effusion or confluent pulmonary opacity. No  acute osseous abnormality identified. Negative visible bowel gas pattern. IMPRESSION: Chronic pulmonary hyperinflation and progressed bilateral upper lobe lung disease since 2010 but no superimposed acute cardiopulmonary abnormality identified. Electronically Signed   By: Odessa Fleming M.D.   On: 11/29/2019 19:57   CT Angio Chest PE W and/or Wo Contrast  Result Date: 11/30/2019 CLINICAL DATA:  Shortness of breath, cough EXAM: CT ANGIOGRAPHY CHEST WITH CONTRAST TECHNIQUE: Multidetector CT imaging of the chest was performed using the standard protocol during bolus administration of intravenous contrast. Multiplanar CT image reconstructions and MIPs were obtained to evaluate the vascular anatomy. CONTRAST:  49mL OMNIPAQUE IOHEXOL 350 MG/ML SOLN COMPARISON:  Chest x-ray 11/29/2019 FINDINGS: Cardiovascular: No filling defects in the pulmonary arteries to suggest pulmonary emboli. Heart is normal size. Aorta is normal caliber. Coronary artery and aortic calcifications. Mediastinum/Nodes: No mediastinal, hilar, or axillary adenopathy. Trachea and esophagus are unremarkable. Thyroid unremarkable. Lungs/Pleura: Emphysema. No confluent opacities, effusions or suspicious nodules. Upper Abdomen: Imaging into the upper abdomen shows no acute findings. Musculoskeletal: Chest wall soft tissues are unremarkable. No acute bony abnormality. Review of the MIP images confirms the above findings. IMPRESSION: No evidence of pulmonary embolus. Coronary artery disease. Aortic Atherosclerosis (ICD10-I70.0) and Emphysema (ICD10-J43.9). Electronically Signed   By: Charlett Nose M.D.   On: 11/30/2019 01:39     ____________________________________________   PROCEDURES  Procedure(s) performed: None Procedures Critical Care performed:  None ____________________________________________   INITIAL IMPRESSION / ASSESSMENT AND PLAN / ED COURSE  64 y.o. male with a history of asthma, COPD, depression, GERD, hypertension who presents for  evaluation of shortness of breath, productive cough, wheezing, and R sided chest pain.  Patient is in mild respiratory distress with increased work of breathing, increased respiratory rate, diffuse bilateral wheezing and decreased air movement.  He is not hypoxic, looks euvolemic, no asymmetric leg swelling.  EKG with no acute ischemic changes.  Differential diagnoses including COPD exacerbation versus asthma exacerbation versus bronchitis versus pneumonia versus Covid.  Chest x-ray showing no evidence of pneumonia but it is consistent with progression of his chronic disease.  Recommended Covid swab however patient reports that he tested 2 days ago negative.  Explained to the patient that it may take several days for the test to be positive and the fact that he had a very close exposure I strongly recommended that.  Patient declined again. Labs with no leukocytosis. Troponin negative. Will check repeat trop and d-dimer. Will give duoneb x 3, magnesium IV, and solumedrol and reassess for dispo/   Clinical Course as of Nov 29 205  Wed Nov 30, 2019  0156 Patient now is moving great air with no further wheezing, remained with his sats 96% both at rest and with ambulation.  D-dimer was elevated therefore he was sent for CT angiogram which is negative for  PE, pneumonia, or any Covid-like changes.  Will discharge patient home on steroids, albuterol, and a Z-Pak for bronchitis.  Discussed close follow-up with PCP and my standard return precautions.   [CV]    Clinical Course User Index [CV] Alfred Levins [place], MD      As part of my medical decision making, I reviewed the following data within the [place] notes reviewed and incorporated, Labs reviewed , EKG interpreted , Old EKG reviewed, Old chart reviewed, Radiograph reviewed , Notes from prior ED visits and  Controlled Substance Database   Please note:  Patient was evaluated in Emergency Department today for the symptoms  described in the history of present illness. Patient was evaluated in the context of the global COVID-19 pandemic, which necessitated consideration that the patient might be at risk for infection with the SARS-CoV-2 virus that causes COVID-19. Institutional protocols and algorithms that pertain to the evaluation of patients at risk for COVID-19 are in a state of rapid change based on information released by regulatory bodies including the CDC and federal and state organizations. These policies and algorithms were followed during the patient's care in the ED.  Some ED evaluations and interventions may be delayed as a result of limited staffing during the pandemic.   ____________________________________________   FINAL CLINICAL IMPRESSION(S) / ED DIAGNOSES   Final diagnoses:  Bronchitis      NEW MEDICATIONS STARTED DURING THIS VISIT:  ED Discharge Orders         Ordered    predniSONE (DELTASONE) 20 MG tablet  Daily     11/30/19 0157    azithromycin (ZITHROMAX Z-PAK) 250 MG tablet     11/30/19 0157    albuterol (VENTOLIN HFA) 108 (90 Base) MCG/ACT inhaler  Every 6 hours PRN     11/30/19 0157           Note:  This document was prepared using Dragon voice recognition software and may include unintentional dictation errors.    Rudene Re, MD 11/30/19 [phone]

## 2019-11-30 ENCOUNTER — Emergency Department: Payer: Medicaid Other

## 2019-11-30 LAB — FIBRIN DERIVATIVES D-DIMER (ARMC ONLY): Fibrin derivatives D-dimer (ARMC): 643.81 ng/mL (FEU) — ABNORMAL HIGH (ref 0.00–499.00)

## 2019-11-30 LAB — TROPONIN I (HIGH SENSITIVITY): Troponin I (High Sensitivity): 5 ng/L (ref <18)

## 2019-11-30 MED ORDER — IOHEXOL 350 MG/ML SOLN
75.0000 mL | Freq: Once | INTRAVENOUS | Status: AC | PRN
  Administered 2019-11-30: 75 mL via INTRAVENOUS

## 2019-11-30 MED ORDER — ALBUTEROL SULFATE HFA 108 (90 BASE) MCG/ACT IN AERS: 2.0000 | INHALATION_SPRAY | Freq: Four times a day (QID) | RESPIRATORY_TRACT | 1 refills | Status: DC | PRN

## 2019-11-30 MED ORDER — PREDNISONE 20 MG PO TABS: 60.0000 mg | ORAL_TABLET | Freq: Every day | ORAL | 0 refills | Status: AC

## 2019-11-30 MED ORDER — AZITHROMYCIN 250 MG PO TABS: ORAL_TABLET | ORAL | 0 refills | Status: AC

## 2019-11-30 NOTE — ED Notes (Signed)
Pt signed esignature.  D/c inst to pt.  Iv dc'ed.   

## 2019-12-06 ENCOUNTER — Other Ambulatory Visit: Payer: Self-pay

## 2019-12-06 ENCOUNTER — Emergency Department: Payer: Medicaid Other

## 2019-12-06 ENCOUNTER — Inpatient Hospital Stay
Admission: EM | Admit: 2019-12-06 | Discharge: 2019-12-11 | DRG: 177 | Disposition: A | Discharge status: Home / Self Care | Payer: Medicaid Other | Attending: Internal Medicine | Admitting: Internal Medicine

## 2019-12-06 DIAGNOSIS — Z825 Family history of asthma and other chronic lower respiratory diseases: Secondary | ICD-10-CM | POA: Diagnosis not present

## 2019-12-06 DIAGNOSIS — J9622 Acute and chronic respiratory failure with hypercapnia: Secondary | ICD-10-CM

## 2019-12-06 DIAGNOSIS — H548 Legal blindness, as defined in USA: Secondary | ICD-10-CM | POA: Diagnosis present

## 2019-12-06 DIAGNOSIS — J9601 Acute respiratory failure with hypoxia: Secondary | ICD-10-CM | POA: Diagnosis present

## 2019-12-06 DIAGNOSIS — H109 Unspecified conjunctivitis: Secondary | ICD-10-CM | POA: Diagnosis not present

## 2019-12-06 DIAGNOSIS — R197 Diarrhea, unspecified: Secondary | ICD-10-CM | POA: Diagnosis not present

## 2019-12-06 DIAGNOSIS — J069 Acute upper respiratory infection, unspecified: Secondary | ICD-10-CM

## 2019-12-06 DIAGNOSIS — Z823 Family history of stroke: Secondary | ICD-10-CM | POA: Diagnosis not present

## 2019-12-06 DIAGNOSIS — Z7951 Long term (current) use of inhaled steroids: Secondary | ICD-10-CM

## 2019-12-06 DIAGNOSIS — J9801 Acute bronchospasm: Secondary | ICD-10-CM | POA: Diagnosis not present

## 2019-12-06 DIAGNOSIS — R0602 Shortness of breath: Secondary | ICD-10-CM

## 2019-12-06 DIAGNOSIS — U071 COVID-19: Secondary | ICD-10-CM

## 2019-12-06 DIAGNOSIS — J9621 Acute and chronic respiratory failure with hypoxia: Secondary | ICD-10-CM

## 2019-12-06 DIAGNOSIS — R112 Nausea with vomiting, unspecified: Secondary | ICD-10-CM | POA: Diagnosis present

## 2019-12-06 DIAGNOSIS — F329 Major depressive disorder, single episode, unspecified: Secondary | ICD-10-CM | POA: Diagnosis present

## 2019-12-06 DIAGNOSIS — J441 Chronic obstructive pulmonary disease with (acute) exacerbation: Secondary | ICD-10-CM | POA: Diagnosis present

## 2019-12-06 DIAGNOSIS — Z79899 Other long term (current) drug therapy: Secondary | ICD-10-CM

## 2019-12-06 DIAGNOSIS — Z87891 Personal history of nicotine dependence: Secondary | ICD-10-CM

## 2019-12-06 DIAGNOSIS — Z8249 Family history of ischemic heart disease and other diseases of the circulatory system: Secondary | ICD-10-CM

## 2019-12-06 DIAGNOSIS — K219 Gastro-esophageal reflux disease without esophagitis: Secondary | ICD-10-CM | POA: Diagnosis present

## 2019-12-06 DIAGNOSIS — F1010 Alcohol abuse, uncomplicated: Secondary | ICD-10-CM | POA: Diagnosis present

## 2019-12-06 DIAGNOSIS — I160 Hypertensive urgency: Secondary | ICD-10-CM | POA: Diagnosis present

## 2019-12-06 DIAGNOSIS — N400 Enlarged prostate without lower urinary tract symptoms: Secondary | ICD-10-CM | POA: Diagnosis present

## 2019-12-06 HISTORY — DX: Acute and chronic respiratory failure with hypercapnia: J96.22

## 2019-12-06 HISTORY — DX: Acute upper respiratory infection, unspecified: J06.9

## 2019-12-06 HISTORY — DX: COVID-19: U07.1

## 2019-12-06 HISTORY — DX: Chronic obstructive pulmonary disease, unspecified: J44.9

## 2019-12-06 LAB — HEPATIC FUNCTION PANEL
ALT: 45 U/L — ABNORMAL HIGH (ref 0–44)
AST: 43 U/L — ABNORMAL HIGH (ref 15–41)
Albumin: 3.7 g/dL (ref 3.5–5.0)
Alkaline Phosphatase: 77 U/L (ref 38–126)
Bilirubin, Direct: 0.1 mg/dL (ref 0.0–0.2)
Indirect Bilirubin: 0.9 mg/dL (ref 0.3–0.9)
Total Bilirubin: 1 mg/dL (ref 0.3–1.2)
Total Protein: 7.1 g/dL (ref 6.5–8.1)

## 2019-12-06 LAB — BASIC METABOLIC PANEL
Anion gap: 10 (ref 5–15)
BUN: 11 mg/dL (ref 8–23)
CO2: 28 mmol/L (ref 22–32)
Calcium: 9 mg/dL (ref 8.9–10.3)
Chloride: 95 mmol/L — ABNORMAL LOW (ref 98–111)
Creatinine, Ser: 0.95 mg/dL (ref 0.61–1.24)
GFR calc Af Amer: >60 mL/min (ref >60)
GFR calc non Af Amer: >60 mL/min (ref >60)
Glucose, Bld: 129 mg/dL — ABNORMAL HIGH (ref 70–99)
Potassium: 3.7 mmol/L (ref 3.5–5.1)
Sodium: 133 mmol/L — ABNORMAL LOW (ref 135–145)

## 2019-12-06 LAB — BRAIN NATRIURETIC PEPTIDE: B Natriuretic Peptide: 18 pg/mL (ref 0.0–100.0)

## 2019-12-06 LAB — CBC WITH DIFFERENTIAL/PLATELET
Abs Immature Granulocytes: 0.02 10*3/uL (ref 0.00–0.07)
Basophils Absolute: 0 10*3/uL (ref 0.0–0.1)
Basophils Relative: 0 %
Eosinophils Absolute: 0.1 10*3/uL (ref 0.0–0.5)
Eosinophils Relative: 1 %
HCT: 45.1 % (ref 39.0–52.0)
Hemoglobin: 15.3 g/dL (ref 13.0–17.0)
Immature Granulocytes: 0 %
Lymphocytes Relative: 26 %
Lymphs Abs: 2 10*3/uL (ref 0.7–4.0)
MCH: 29.8 pg (ref 26.0–34.0)
MCHC: 33.9 g/dL (ref 30.0–36.0)
MCV: 87.9 fL (ref 80.0–100.0)
Monocytes Absolute: 0.9 10*3/uL (ref 0.1–1.0)
Monocytes Relative: 11 %
Neutro Abs: 4.9 10*3/uL (ref 1.7–7.7)
Neutrophils Relative %: 62 %
Platelets: 243 10*3/uL (ref 150–400)
RBC: 5.13 MIL/uL (ref 4.22–5.81)
RDW: 13.1 % (ref 11.5–15.5)
WBC: 7.9 10*3/uL (ref 4.0–10.5)
nRBC: 0 % (ref 0.0–0.2)

## 2019-12-06 LAB — FIBRINOGEN: Fibrinogen: 534 mg/dL — ABNORMAL HIGH (ref 210–475)

## 2019-12-06 LAB — RESPIRATORY PANEL BY RT PCR (FLU A&B, COVID)
Influenza A by PCR: NEGATIVE
Influenza B by PCR: NEGATIVE
SARS Coronavirus 2 by RT PCR: POSITIVE — AB

## 2019-12-06 LAB — TROPONIN I (HIGH SENSITIVITY): Troponin I (High Sensitivity): 8 ng/L (ref <18)

## 2019-12-06 LAB — HEPATITIS B SURFACE ANTIGEN: Hepatitis B Surface Ag: NONREACTIVE

## 2019-12-06 LAB — FERRITIN: Ferritin: 87 ng/mL (ref 24–336)

## 2019-12-06 LAB — FIBRIN DERIVATIVES D-DIMER (ARMC ONLY): Fibrin derivatives D-dimer (ARMC): 575.17 ng/mL (FEU) — ABNORMAL HIGH (ref 0.00–499.00)

## 2019-12-06 LAB — TRIGLYCERIDES: Triglycerides: 23 mg/dL (ref <150)

## 2019-12-06 LAB — LACTATE DEHYDROGENASE: LDH: 170 U/L (ref 98–192)

## 2019-12-06 LAB — PROCALCITONIN: Procalcitonin: <0.1 ng/mL

## 2019-12-06 LAB — POC SARS CORONAVIRUS 2 AG -  ED: SARS Coronavirus 2 Ag: NEGATIVE

## 2019-12-06 LAB — C-REACTIVE PROTEIN: CRP: 3.4 mg/dL — ABNORMAL HIGH (ref <1.0)

## 2019-12-06 MED ORDER — METHYLPREDNISOLONE SODIUM SUCC 125 MG IJ SOLR
125.0000 mg | Freq: Once | INTRAMUSCULAR | Status: AC
  Administered 2019-12-06: 125 mg via INTRAVENOUS
  Filled 2019-12-06: qty 2

## 2019-12-06 MED ORDER — THIAMINE HCL 100 MG PO TABS
100.0000 mg | ORAL_TABLET | Freq: Every day | ORAL | Status: DC
  Administered 2019-12-06 – 2019-12-11 (×6): 100 mg via ORAL
  Filled 2019-12-06 (×6): qty 1

## 2019-12-06 MED ORDER — THIAMINE HCL 100 MG/ML IJ SOLN
100.0000 mg | Freq: Every day | INTRAMUSCULAR | Status: DC
  Filled 2019-12-06: qty 2

## 2019-12-06 MED ORDER — ALBUTEROL SULFATE HFA 108 (90 BASE) MCG/ACT IN AERS
2.0000 | INHALATION_SPRAY | RESPIRATORY_TRACT | Status: DC | PRN
  Administered 2019-12-07 – 2019-12-11 (×5): 2 via RESPIRATORY_TRACT
  Filled 2019-12-06: qty 6.7

## 2019-12-06 MED ORDER — ALBUTEROL SULFATE HFA 108 (90 BASE) MCG/ACT IN AERS
2.0000 | INHALATION_SPRAY | Freq: Once | RESPIRATORY_TRACT | Status: AC
  Administered 2019-12-06: 2 via RESPIRATORY_TRACT
  Filled 2019-12-06: qty 6.7

## 2019-12-06 MED ORDER — LORAZEPAM 2 MG/ML IJ SOLN
0.0000 mg | Freq: Four times a day (QID) | INTRAMUSCULAR | Status: DC
  Administered 2019-12-07: 14:00:00 2 mg via INTRAVENOUS
  Filled 2019-12-06: qty 1

## 2019-12-06 MED ORDER — SODIUM CHLORIDE 0.9 % IV SOLN: INTRAVENOUS | Status: DC

## 2019-12-06 MED ORDER — FOLIC ACID 1 MG PO TABS
1.0000 mg | ORAL_TABLET | Freq: Every day | ORAL | Status: DC
  Administered 2019-12-06 – 2019-12-11 (×6): 1 mg via ORAL
  Filled 2019-12-06 (×6): qty 1

## 2019-12-06 MED ORDER — METHYLPREDNISOLONE SODIUM SUCC 40 MG IJ SOLR
40.0000 mg | Freq: Two times a day (BID) | INTRAMUSCULAR | Status: DC
  Administered 2019-12-06 – 2019-12-07 (×2): 40 mg via INTRAVENOUS
  Filled 2019-12-06 (×2): qty 1

## 2019-12-06 MED ORDER — IPRATROPIUM BROMIDE HFA 17 MCG/ACT IN AERS
2.0000 | INHALATION_SPRAY | RESPIRATORY_TRACT | Status: DC
  Administered 2019-12-06 – 2019-12-11 (×28): 2 via RESPIRATORY_TRACT
  Filled 2019-12-06: qty 12.9

## 2019-12-06 MED ORDER — ASCORBIC ACID 500 MG PO TABS
500.0000 mg | ORAL_TABLET | Freq: Every day | ORAL | Status: DC
  Administered 2019-12-06 – 2019-12-11 (×6): 500 mg via ORAL
  Filled 2019-12-06 (×7): qty 1

## 2019-12-06 MED ORDER — DM-GUAIFENESIN ER 30-600 MG PO TB12
1.0000 | ORAL_TABLET | Freq: Two times a day (BID) | ORAL | Status: DC
  Administered 2019-12-06 – 2019-12-11 (×11): 1 via ORAL
  Filled 2019-12-06 (×11): qty 1

## 2019-12-06 MED ORDER — LORAZEPAM 1 MG PO TABS: 1.0000 mg | ORAL_TABLET | ORAL | Status: DC | PRN

## 2019-12-06 MED ORDER — ADULT MULTIVITAMIN W/MINERALS CH
1.0000 | ORAL_TABLET | Freq: Every day | ORAL | Status: DC
  Administered 2019-12-06 – 2019-12-11 (×6): 1 via ORAL
  Filled 2019-12-06 (×6): qty 1

## 2019-12-06 MED ORDER — SODIUM CHLORIDE 0.9 % IV SOLN
100.0000 mg | Freq: Every day | INTRAVENOUS | Status: AC
  Administered 2019-12-07 – 2019-12-10 (×4): 100 mg via INTRAVENOUS
  Filled 2019-12-06 (×6): qty 20

## 2019-12-06 MED ORDER — ACETAMINOPHEN 325 MG PO TABS: 650.0000 mg | ORAL_TABLET | Freq: Four times a day (QID) | ORAL | Status: DC | PRN

## 2019-12-06 MED ORDER — MAGNESIUM SULFATE 2 GM/50ML IV SOLN
2.0000 g | Freq: Once | INTRAVENOUS | Status: AC
  Administered 2019-12-06: 2 g via INTRAVENOUS
  Filled 2019-12-06: qty 50

## 2019-12-06 MED ORDER — ONDANSETRON HCL 4 MG/2ML IJ SOLN: 4.0000 mg | Freq: Three times a day (TID) | INTRAMUSCULAR | Status: DC | PRN

## 2019-12-06 MED ORDER — ENOXAPARIN SODIUM 40 MG/0.4ML ~~LOC~~ SOLN
40.0000 mg | SUBCUTANEOUS | Status: DC
  Administered 2019-12-06 – 2019-12-10 (×5): 40 mg via SUBCUTANEOUS
  Filled 2019-12-06 (×5): qty 0.4

## 2019-12-06 MED ORDER — ONDANSETRON HCL 4 MG/2ML IJ SOLN
4.0000 mg | Freq: Once | INTRAMUSCULAR | Status: AC
  Administered 2019-12-06: 4 mg via INTRAVENOUS
  Filled 2019-12-06: qty 2

## 2019-12-06 MED ORDER — LORAZEPAM 2 MG/ML IJ SOLN: 1.0000 mg | INTRAMUSCULAR | Status: DC | PRN

## 2019-12-06 MED ORDER — LORAZEPAM 2 MG/ML IJ SOLN: 0.0000 mg | Freq: Two times a day (BID) | INTRAMUSCULAR | Status: DC

## 2019-12-06 MED ORDER — ZINC SULFATE 220 (50 ZN) MG PO CAPS
220.0000 mg | ORAL_CAPSULE | Freq: Every day | ORAL | Status: DC
  Administered 2019-12-06 – 2019-12-11 (×6): 220 mg via ORAL
  Filled 2019-12-06 (×6): qty 1

## 2019-12-06 MED ORDER — SODIUM CHLORIDE 0.9 % IV SOLN
200.0000 mg | Freq: Once | INTRAVENOUS | Status: AC
  Administered 2019-12-06: 18:00:00 200 mg via INTRAVENOUS
  Filled 2019-12-06: qty 200

## 2019-12-06 NOTE — ED Notes (Signed)
Provided pt w/ lunch tray and ginger ale

## 2019-12-06 NOTE — ED Notes (Signed)
Pt placed on 2L nasal cannula. Oxygen saturation currently 96%.

## 2019-12-06 NOTE — Progress Notes (Signed)
Remdesivir - Pharmacy Brief Note   O:  CXR: Emphysematous changes and pulmonary scarring but no acute overlying pulmonary process SpO2: 95% on 2LNC   A/P:  Remdesivir 200 mg IVPB once followed by 100 mg IVPB daily x 4 days.   Laureen Ochs, PharmD 12/06/2019 3:30 PM

## 2019-12-06 NOTE — H&P (Signed)
History and Physical    Bryan Kelley AVW:[id number] DOB: 07/29/1956 DOA: 12/06/2019  Referring MD/NP/PA:   PCP: Patient, No Pcp Per   Patient coming from:  The patient is coming from home.  At baseline, pt is independent for most of ADL.        Chief Complaint: Shortness of breath  HPI: Bryan Kelley is a 64 y.o. male with medical history significant of COPD, asthma, GERD, left eye poor vision, BPH, alcohol abuse, depression, who presents with shortness of breath.  Patient states that he has been having cough and shortness of breath for 3 days, which has been progressively worsening.  He also has some mild chest pain intermittently, which is pleuritic, mild, nonradiating.  Denies fever or chills currently.  Patient has nausea vomiting and diarrhea in the past several days.  Patient states that his diarrhea has resolved today.  He has some central abdominal discomfort, but not active abdominal pain.  No symptoms of UTI.  No unilateral weakness.  ED Course: pt was found to have positive Covid PCR, troponin VIII, electrolytes renal function okay, temperature normal, blood pressure soft, tachycardia, oxygen saturation 99% on room air, which improved to 100% on 2 L nasal cannula oxygen.  Chest x-ray showed emphysematous change no obvious infiltration.  Patient is admitted to MedSurg bed as inpatient.   Review of Systems:   General: no fevers, chills, no body weight gain, has poor appetite, has fatigue HEENT: no blurry vision, hearing changes or sore throat Respiratory: has dyspnea, coughing, wheezing CV: no chest pain, no palpitations GI: has nausea, vomiting, abdominal discomfort, diarrhea, no constipation GU: no dysuria, burning on urination, increased urinary frequency, hematuria  Ext: no leg edema Neuro: no unilateral weakness, numbness, or tingling, no hearing loss Skin: no rash, no skin tear. MSK: No muscle spasm, no deformity, no limitation of range of movement in spin Heme: No easy  bruising.  Travel history: No recent long distant travel.  Allergy: No Known Allergies  Past Medical History:  Diagnosis Date  . Asthma   . COPD (chronic obstructive pulmonary disease) (HCC)   . Depression   . GERD (gastroesophageal reflux disease)   . Vision loss, left eye     Past Surgical History:  Procedure Laterality Date  . EYE SURGERY  2009, about 5374,8270   steel in left eye on the job, legally blind     Social History:  reports that he has quit smoking. He smoked 0.50 packs per day. He has never used smokeless tobacco. He reports current alcohol use. He reports that he does not use drugs.  Family History:  Family History  Problem Relation Age of Onset  . Diabetes Mother   . Hypertension Mother   . Stroke Mother   . Asthma Father   . Diabetes Father   . Diabetes Sister   . Hypertension Sister   . Diabetes Brother   . Hypertension Brother      Prior to Admission medications   Medication Sig Start Date End Date Taking? Authorizing Provider  albuterol (VENTOLIN HFA) 108 (90 Base) MCG/ACT inhaler Inhale 2 puffs into the lungs every 6 (six) hours as needed for wheezing or shortness of breath. 11/30/19   Don Perking, [place], MD  Brinzolamide-Brimonidine 1-0.2 % SUSP Apply 1 drop to eye daily. Into left eye. 11/01/19   Lurene Shadow, MD  budesonide-formoterol [facility]) 160-4.5 MCG/ACT inhaler Inhale 2 puffs into the lungs 2 (two) times daily.    [provider]  Dextran  70-Hypromellose (ARTIFICIAL TEARS) 0.1-0.3 % SOLN Apply 1 drop to eye 4 (four) times daily as needed. In both eyes for dry eyes. 01/26/18   [provider]  guaiFENesin-dextromethorphan (ROBITUSSIN DM) 100-10 MG/5ML syrup Take 5 mLs by mouth every 6 (six) hours as needed for cough. 11/01/19   Lurene Shadow, MD  ipratropium (ATROVENT HFA) 17 MCG/ACT inhaler Inhale 2 puffs into the lungs 3 (three) times daily.    [provider]  ipratropium-albuterol (DUONEB) 0.5-2.5 (3) MG/3ML  SOLN Take 3 mLs by nebulization every 6 (six) hours as needed. 11/01/19   Lurene Shadow, MD  omeprazole (PRILOSEC) 20 MG capsule Take 1 capsule (20 mg total) by mouth daily. 08/12/12   [place], Velna Hatchet, MD  tamsulosin (FLOMAX) 0.4 MG CAPS capsule Take 0.4 mg by mouth daily with breakfast. Take 30 minutes after breakfast.    [provider]    Physical Exam: Vitals:   12/06/19 1430 12/06/19 1500 12/06/19 1530 12/06/19 1642  BP: 139/76 (!) 132/91 (!) 143/84 (!) 150/108  Pulse: 86 88 90 85  Resp: 17 17 18 18   Temp:    98.4 F (36.9 C)  TempSrc:    Oral  SpO2: 96% 96% 95% 97%  Weight:      Height:       General: Not in acute distress HEENT:       Eyes: PERRL, EOMI, no scleral icterus. Has poor vision in left eye       ENT: No discharge from the ears and nose, no pharynx injection, no tonsillar enlargement.        Neck: No JVD, no bruit, no mass felt. Heme: No neck lymph node enlargement. Cardiac: S1/S2, RRR, No murmurs, No gallops or rubs. Respiratory: has wheezing bilaterally. GI: Soft, nondistended, nontender, no rebound pain, no organomegaly, BS present. GU: No hematuria Ext: No pitting leg edema bilaterally. 2+DP/PT pulse bilaterally. Musculoskeletal: No joint deformities, No joint redness or warmth, no limitation of ROM in spin. Skin: No rashes.  Neuro: Alert, oriented X3, cranial nerves II-XII grossly intact, moves all extremities normally.   Psych: Patient is not psychotic, no suicidal or hemocidal ideation.  Labs on Admission: I have personally reviewed following labs and imaging studies  CBC: Recent Labs  Lab 11/29/19 1910 12/06/19 0951  WBC 7.7 7.9  NEUTROABS  --  4.9  HGB 14.3 15.3  HCT 42.2 45.1  MCV 90.2 87.9  PLT 271 243   Basic Metabolic Panel: Recent Labs  Lab 11/29/19 1910 12/06/19 0951  NA 142 133*  K 3.9 3.7  CL 107 95*  CO2 27 28  GLUCOSE 110* 129*  BUN 15 11  CREATININE 0.86 0.95  CALCIUM 8.7* 9.0   GFR: Estimated Creatinine  Clearance: 78.8 mL/min (by C-G formula based on SCr of 0.95 mg/dL). Liver Function Tests: No results for input(s): AST, ALT, ALKPHOS, BILITOT, PROT, ALBUMIN in the last 168 hours. No results for input(s): LIPASE, AMYLASE in the last 168 hours. No results for input(s): AMMONIA in the last 168 hours. Coagulation Profile: No results for input(s): INR, PROTIME in the last 168 hours. Cardiac Enzymes: No results for input(s): CKTOTAL, CKMB, CKMBINDEX, TROPONINI in the last 168 hours. BNP (last 3 results) No results for input(s): PROBNP in the last 8760 hours. HbA1C: No results for input(s): HGBA1C in the last 72 hours. CBG: No results for input(s): GLUCAP in the last 168 hours. Lipid Profile: No results for input(s): CHOL, HDL, LDLCALC, TRIG, CHOLHDL, LDLDIRECT in the last 72 hours.  Thyroid Function Tests: No results for input(s): TSH, T4TOTAL, FREET4, T3FREE, THYROIDAB in the last 72 hours. Anemia Panel: No results for input(s): VITAMINB12, FOLATE, FERRITIN, TIBC, IRON, RETICCTPCT in the last 72 hours. Urine analysis: No results found for: COLORURINE, APPEARANCEUR, LABSPEC, PHURINE, GLUCOSEU, HGBUR, BILIRUBINUR, KETONESUR, PROTEINUR, UROBILINOGEN, NITRITE, LEUKOCYTESUR Sepsis Labs: @LABRCNTIP (procalcitonin:4,lacticidven:4) ) Recent Results (from the past 240 hour(s))  Respiratory Panel by RT PCR (Flu A&B, Covid) - Nasopharyngeal Swab     Status: Abnormal   Collection Time: 12/06/19 12:01 PM   Specimen: Nasopharyngeal Swab  Result Value Ref Range Status   SARS Coronavirus 2 by RT PCR POSITIVE (A) NEGATIVE Final    Comment: RESULT CALLED TO, READ BACK BY AND VERIFIED WITH: MITCH BARKER 12/06/19 1427 KLW (NOTE) SARS-CoV-2 target nucleic acids are DETECTED. SARS-CoV-2 RNA is generally detectable in upper respiratory specimens  during the acute phase of infection. Positive results are indicative of the presence of the identified virus, but do not rule out bacterial infection or  co-infection with other pathogens not detected by the test. Clinical correlation with patient history and other diagnostic information is necessary to determine patient infection status. The expected result is Negative. Fact Sheet for Patients:  02/03/20 Fact Sheet for Healthcare Providers: https://www.moore.com/ This test is not yet approved or cleared by the https://www.young.biz/ FDA and  has been authorized for detection and/or diagnosis of SARS-CoV-2 by FDA under an Emergency Use Authorization (EUA).  This EUA will remain in effect (meaning this test can be used) for the  duration of  the COVID-19 declaration under Section 564(b)(1) of the Act, 21 U.S.C. section 360bbb-3(b)(1), unless the authorization is terminated or revoked sooner.    Influenza A by PCR NEGATIVE NEGATIVE Final   Influenza B by PCR NEGATIVE NEGATIVE Final    Comment: (NOTE) The Xpert Xpress SARS-CoV-2/FLU/RSV assay is intended as an aid in  the diagnosis of influenza from Nasopharyngeal swab specimens and  should not be used as a sole basis for treatment. Nasal washings and  aspirates are unacceptable for Xpert Xpress SARS-CoV-2/FLU/RSV  testing. Fact Sheet for Patients: [place] Fact Sheet for Healthcare Providers: https://www.moore.com/ This test is not yet approved or cleared by the https://www.young.biz/ FDA and  has been authorized for detection and/or diagnosis of SARS-CoV-2 by  FDA under an Emergency Use Authorization (EUA). This EUA will remain  in effect (meaning this test can be used) for the duration of the  Covid-19 declaration under Section 564(b)(1) of the Act, 21  U.S.C. section 360bbb-3(b)(1), unless the authorization is  terminated or revoked. Performed at [facility], [address], [place], [place]      Radiological Exams on Admission: DG Chest 2 View  Result Date:  12/06/2019 CLINICAL DATA:  Increasing shortness of breath. EXAM: CHEST - 2 VIEW COMPARISON:  Chest x-ray 11/29/2019 and chest CT 11/30/2019 FINDINGS: The heart is normal in size. Stable tortuosity and calcification of the thoracic aorta. Stable emphysematous changes and mild pulmonary scarring but no acute overlying pulmonary findings. The bony thorax is intact. IMPRESSION: Emphysematous changes and pulmonary scarring but no acute overlying pulmonary process. Electronically Signed   By: 12/02/2019 M.D.   On: 12/06/2019 10:14     EKG: Independently reviewed.  Sinus rhythm, QTC 471, tachycardia, bilateral atrial enlargement, PAC  Assessment/Plan Principal Problem:   Acute respiratory disease due to COVID-19 virus Active Problems:   GERD   BPH (benign prostatic hyperplasia)   Alcohol abuse   COPD exacerbation (HCC)  Acute on chronic respiratory failure with hypoxia (HCC)   Nausea vomiting and diarrhea   Acute on chronic respiratory failure with hypoxia due to acute respiratory disease due to COVID-19 virus and COPD exacerbation: Oxygen desaturated to 91% on room air, which improved to 100% on 2 L nasal cannula oxygen.  Chest x-ray did not show infiltration, but showed erythematous change.  Patient has wheezing on auscultation.  -will admit to med-surg bed as inpt -Remdesivir per pharm -Solumedrol 40 mg bid -vitamin C, zinc.  -Bronchodilators -PRN Mucinex for cough -Gentle IV fluid:  -D-dimer, BNP,Trop, LFT, CRP, LDH, Procalcitonin, Ferritin, fibinogen, TG, Hep B SAg, HIV ab -Daily CRP, Ferritin, D-dimer, -Will ask the patient to maintain an awake prone position for 16+ hours a day, if possible, with a minimum of 2-3 hours at a time -Will attempt to maintain euvolemia to a net negative fluid status -IF patient deteriorates, will consult PCCM and ID  GERD: -ppi  BPH (benign prostatic hyperplasia): -flomax  Alcohol abuse: -CiWA protocol  Nausea vomiting and diarrhea: Most  likely due to COVID-19 infection.  Patient states that his diarrhea has resolved. -Supportive care -As needed Zofran -IV fluid     Inpatient status:  # Patient requires inpatient status due to high intensity of service, high risk for further deterioration and high frequency of surveillance required.  I certify that at the point of admission it is my clinical judgment that the patient will require inpatient hospital care spanning beyond 2 midnights from the point of admission.  . This patient has multiple chronic comorbidities includingCOPD, asthma, GERD, left eye poor vision, BPH, alcohol abuse, depression .  Marland Kitchen Now patient has presenting with acute on chronic respiratory failure with hypoxia due to acute respiratory disease due to COVID-19 virus and COPD exacerbation . The worrisome physical exam findings include wheezing on auscultation . The initial radiographic and laboratory data are worrisome because of positive COVID-19 PCR . Current medical needs: please see my assessment and plan . Predictability of an adverse outcome (risk):  Patient has multiple comorbidities as listed above. Now presents with acute on chronic respiratory failure with hypoxia due to acute respiratory disease due to COVID-19 virus and COPD exacerbation. Patient's presentation is highly complicated.  Patient is at high risk of deteriorating.  Will need to be treated in hospital for at least 2 days.             DVT ppx: SQ Lovenox Code Status: Full code Family Communication: None at bed side.  Disposition Plan:  Anticipate discharge back to previous home environment Consults called:  none Admission status: Med-surg bed as inpt      Date of Service 12/06/2019    [facility]   If 7PM-7AM, please contact night-coverage [email or web address] Password [facility] 12/06/2019, 5:26 PM

## 2019-12-06 NOTE — ED Notes (Signed)
Sent rainbow to lab. 

## 2019-12-06 NOTE — ED Provider Notes (Signed)
[facility] Emergency Department Provider Note   ____________________________________________    I have reviewed the triage vital signs and the nursing notes.   HISTORY  Chief Complaint Shortness of Breath     HPI Bryan Kelley is a 64 y.o. male with a history of COPD who presents with complaints of shortness of breath.  Patient reports worsening shortness of breath over the last 3 days but also reports nausea and diarrhea, fatigue and myalgias.  No fevers reported.  Positive dry cough.  No sick contacts reported.  Is used his inhalers at home with little improvement.  Past Medical History:  Diagnosis Date  . Asthma   . COPD (chronic obstructive pulmonary disease) (HCC)   . Depression   . GERD (gastroesophageal reflux disease)   . Vision loss, left eye     Patient Active Problem List   Diagnosis Date Noted  . Acute on chronic respiratory failure with hypoxia (HCC) 12/06/2019  . Nausea vomiting and diarrhea 12/06/2019  . COPD (chronic obstructive pulmonary disease) (HCC) 10/30/2019  . Hyperglycemia   . COPD with acute exacerbation (HCC) 10/29/2019  . COPD exacerbation (HCC) 10/29/2019  . Epigastric abdominal pain 08/12/2012  . Alcohol abuse, daily use 03/14/2012  . Elevated BP 12/07/2011  . ED (erectile dysfunction) 07/28/2011  . NICOTINE ADDICTION 10/29/2009  . ACUTE BRONCHITIS 05/07/2009  . 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 ANKLE PAIN, RIGHT 10/04/2008  . FATIGUE 10/04/2008  . ANXIETY 02/17/2008  . ASTHMA 02/17/2008  . GERD 02/17/2008  . BPH (benign prostatic hyperplasia) 02/17/2008    Past Surgical History:  Procedure Laterality Date  . EYE SURGERY  2009, about [id number],1191   steel in left eye on the job, legally blind     Prior to Admission medications   Medication Sig Start Date End Date Taking? Authorizing Provider  albuterol (VENTOLIN HFA) 108 (90 Base) MCG/ACT inhaler Inhale 2 puffs into the lungs every 6 (six)  hours as needed for wheezing or shortness of breath. 11/30/19  Yes Don Perking, [place], MD  Brinzolamide-Brimonidine 1-0.2 % SUSP Apply 1 drop to eye daily. Into left eye. 11/01/19  Yes Lurene Shadow, MD  budesonide-formoterol [facility]) 160-4.5 MCG/ACT inhaler Inhale 2 puffs into the lungs 2 (two) times daily.   Yes [provider]  Dextran 70-Hypromellose (ARTIFICIAL TEARS) 0.1-0.3 % SOLN Apply 1 drop to eye 4 (four) times daily as needed. In both eyes for dry eyes. 01/26/18  Yes [provider]  ipratropium (ATROVENT HFA) 17 MCG/ACT inhaler Inhale 2 puffs into the lungs 3 (three) times daily.   Yes [provider]  ipratropium-albuterol (DUONEB) 0.5-2.5 (3) MG/3ML SOLN Take 3 mLs by nebulization every 6 (six) hours as needed. 11/01/19  Yes Lurene Shadow, MD  omeprazole (PRILOSEC) 20 MG capsule Take 1 capsule (20 mg total) by mouth daily. 08/12/12  Yes [place], Velna Hatchet, MD  tamsulosin (FLOMAX) 0.4 MG CAPS capsule Take 0.4 mg by mouth daily with breakfast. Take 30 minutes after breakfast.   Yes [provider]  guaiFENesin-dextromethorphan (ROBITUSSIN DM) 100-10 MG/5ML syrup Take 5 mLs by mouth every 6 (six) hours as needed for cough. 11/01/19   Lurene Shadow, MD     Allergies Patient has no known allergies.  Family History  Problem Relation Age of Onset  . Diabetes Mother   . Hypertension Mother   . Stroke Mother   . Asthma Father   . Diabetes Father   . Diabetes Sister   . Hypertension Sister   .  Diabetes Brother   . Hypertension Brother     Social History Social History   Tobacco Use  . Smoking status: Former Smoker    Packs/day: 0.50  . Smokeless tobacco: Never Used  Substance Use Topics  . Alcohol use: Yes    Comment: vodka on the weekends  . Drug use: No    Review of Systems  Constitutional: No fever/chills Eyes: No visual changes.  ENT: No sore throat. Cardiovascular: Denies chest pain. Respiratory: As  above Gastrointestinal: No abdominal pain.  Nausea and diarrhea Genitourinary: Negative for dysuria. Musculoskeletal: Myalgias Skin: Negative for rash. Neurological: Negative for headaches or weakness   ____________________________________________   PHYSICAL EXAM:  VITAL SIGNS: ED Triage Vitals  Enc Vitals Group     BP 12/06/19 0943 (!) 121/99     Pulse Rate 12/06/19 0943 (!) 113     Resp 12/06/19 0943 (!) 24     Temp 12/06/19 0943 98.8 F (37.1 C)     Temp Source 12/06/19 0943 Oral     SpO2 12/06/19 0943 93 %     Weight 12/06/19 0944 77.1 kg (170 lb)     Height 12/06/19 0944 1.676 m (5\' 6" )     Head Circumference --      Peak Flow --      Pain Score 12/06/19 0944 6     Pain Loc --      Pain Edu? --      Excl. in GC? --     Constitutional: Alert and oriented.  Nose: No congestion/rhinnorhea. Mouth/Throat: Mucous membranes are moist.   Neck:  Painless ROM Cardiovascular: Normal rate, regular rhythm. Grossly normal heart sounds.  Good peripheral circulation. Respiratory: Increased respiratory effort with tachypnea, significant wheezing diffusely Gastrointestinal: Soft and nontender. No distention.    Musculoskeletal: .  Warm and well perfused Neurologic:  Normal speech and language. No gross focal neurologic deficits are appreciated.  Skin:  Skin is warm, dry and intact. No rash noted. Psychiatric: Mood and affect are normal. Speech and behavior are normal.  ____________________________________________   LABS (all labs ordered are listed, but only abnormal results are displayed)  Labs Reviewed  RESPIRATORY PANEL BY RT PCR (FLU A&B, COVID) - Abnormal; Notable for the following components:      Result Value   SARS Coronavirus 2 by RT PCR POSITIVE (*)    All other components within normal limits  BASIC METABOLIC PANEL - Abnormal; Notable for the following components:   Sodium 133 (*)    Chloride 95 (*)    Glucose, Bld 129 (*)    All other components within  normal limits  CBC WITH DIFFERENTIAL/PLATELET  POC SARS CORONAVIRUS 2 AG -  ED  TROPONIN I (HIGH SENSITIVITY)   ____________________________________________  EKG  ED ECG REPORT I, 02/03/20, the attending physician, personally viewed and interpreted this ECG.  Date: 12/06/2019  Rhythm: Sinus tachycardia QRS Axis: normal Intervals: normal ST/T Wave abnormalities: Nonspecific changes Narrative Interpretation: no evidence of acute ischemia  ____________________________________________  RADIOLOGY  Chest x-ray unremarkable ____________________________________________   PROCEDURES  Procedure(s) performed: No  Procedures   Critical Care performed: No ____________________________________________   INITIAL IMPRESSION / ASSESSMENT AND PLAN / ED COURSE  Pertinent labs & imaging results that were available during my care of the patient were reviewed by me and considered in my medical decision making (see chart for details).  Patient presents with worsening shortness of breath, with significant wheezing suspicious for COPD exacerbation.  However description of nausea,  diarrhea, myalgias suggest possible viral infection such as novel coronavirus.  Rapid test negative, chest x-ray reassuring.  Lab work overall unremarkable.  Improved with IV magnesium, IV Solu-Medrol and inhaler however still tachypneic and wheezing.  Will discuss with hospitalist for admission  PCR test has returned positive for coronavirus    ____________________________________________   FINAL CLINICAL IMPRESSION(S) / ED DIAGNOSES  Final diagnoses:  COPD exacerbation ([place])  Shortness of breath        Note:  This document was prepared using Dragon voice recognition software and may include unintentional dictation errors.   Lavonia Drafts, MD 12/06/19 1524

## 2019-12-06 NOTE — ED Notes (Signed)
Admitting provider at bedside, informed him of recent Covid + result.

## 2019-12-06 NOTE — ED Triage Notes (Signed)
Pt states he has a hx of COPD and over the past 3 days is having increased SOB with cough and congestion, vomiting and diarrhea. Pt is tachypnic on arrival

## 2019-12-06 NOTE — ED Notes (Signed)
Pt transported to Xray. 

## 2019-12-07 LAB — CBC WITH DIFFERENTIAL/PLATELET
Abs Immature Granulocytes: 0.04 10*3/uL (ref 0.00–0.07)
Basophils Absolute: 0 10*3/uL (ref 0.0–0.1)
Basophils Relative: 0 %
Eosinophils Absolute: 0 10*3/uL (ref 0.0–0.5)
Eosinophils Relative: 0 %
HCT: 42.1 % (ref 39.0–52.0)
Hemoglobin: 14.1 g/dL (ref 13.0–17.0)
Immature Granulocytes: 1 %
Lymphocytes Relative: 16 %
Lymphs Abs: 1.3 10*3/uL (ref 0.7–4.0)
MCH: 29.7 pg (ref 26.0–34.0)
MCHC: 33.5 g/dL (ref 30.0–36.0)
MCV: 88.8 fL (ref 80.0–100.0)
Monocytes Absolute: 0.5 10*3/uL (ref 0.1–1.0)
Monocytes Relative: 6 %
Neutro Abs: 6.3 10*3/uL (ref 1.7–7.7)
Neutrophils Relative %: 77 %
Platelets: 216 10*3/uL (ref 150–400)
RBC: 4.74 MIL/uL (ref 4.22–5.81)
RDW: 13.2 % (ref 11.5–15.5)
WBC: 8.2 10*3/uL (ref 4.0–10.5)
nRBC: 0 % (ref 0.0–0.2)

## 2019-12-07 LAB — FIBRIN DERIVATIVES D-DIMER (ARMC ONLY): Fibrin derivatives D-dimer (ARMC): 407.41 ng/mL (FEU) (ref 0.00–499.00)

## 2019-12-07 LAB — FERRITIN: Ferritin: 80 ng/mL (ref 24–336)

## 2019-12-07 LAB — MAGNESIUM: Magnesium: 2.1 mg/dL (ref 1.7–2.4)

## 2019-12-07 LAB — C-REACTIVE PROTEIN: CRP: 4.4 mg/dL — ABNORMAL HIGH (ref <1.0)

## 2019-12-07 MED ORDER — HYDROCODONE-HOMATROPINE 5-1.5 MG/5ML PO SYRP
5.0000 mL | ORAL_SOLUTION | Freq: Four times a day (QID) | ORAL | Status: DC | PRN
  Administered 2019-12-07 – 2019-12-11 (×7): 5 mL via ORAL
  Filled 2019-12-07 (×7): qty 5

## 2019-12-07 MED ORDER — BRIMONIDINE TARTRATE 0.2 % OP SOLN
1.0000 [drp] | Freq: Every day | OPHTHALMIC | Status: DC
  Administered 2019-12-07 – 2019-12-11 (×5): 1 [drp] via OPHTHALMIC
  Filled 2019-12-07: qty 5

## 2019-12-07 MED ORDER — METHYLPREDNISOLONE SODIUM SUCC 40 MG IJ SOLR
40.0000 mg | Freq: Two times a day (BID) | INTRAMUSCULAR | Status: DC
  Administered 2019-12-07 – 2019-12-08 (×2): 40 mg via INTRAVENOUS
  Filled 2019-12-07 (×2): qty 1

## 2019-12-07 MED ORDER — BRINZOLAMIDE 1 % OP SUSP
1.0000 [drp] | Freq: Every day | OPHTHALMIC | Status: DC
  Administered 2019-12-07 – 2019-12-08 (×2): 1 [drp] via OPHTHALMIC
  Filled 2019-12-07: qty 10

## 2019-12-07 MED ORDER — PANTOPRAZOLE SODIUM 40 MG PO TBEC
40.0000 mg | DELAYED_RELEASE_TABLET | Freq: Every day | ORAL | Status: DC
  Administered 2019-12-07 – 2019-12-11 (×5): 40 mg via ORAL
  Filled 2019-12-07 (×5): qty 1

## 2019-12-07 MED ORDER — MOMETASONE FURO-FORMOTEROL FUM 200-5 MCG/ACT IN AERO
2.0000 | INHALATION_SPRAY | Freq: Two times a day (BID) | RESPIRATORY_TRACT | Status: DC
  Administered 2019-12-07 – 2019-12-11 (×9): 2 via RESPIRATORY_TRACT
  Filled 2019-12-07: qty 8.8

## 2019-12-07 MED ORDER — LORAZEPAM 2 MG/ML IJ SOLN
1.0000 mg | Freq: Once | INTRAMUSCULAR | Status: AC
  Administered 2019-12-07: 05:00:00 1 mg via INTRAVENOUS
  Filled 2019-12-07: qty 1

## 2019-12-07 MED ORDER — TAMSULOSIN HCL 0.4 MG PO CAPS
0.4000 mg | ORAL_CAPSULE | Freq: Every day | ORAL | Status: DC
  Administered 2019-12-07 – 2019-12-11 (×5): 0.4 mg via ORAL
  Filled 2019-12-07 (×5): qty 1

## 2019-12-07 MED ORDER — POLYVINYL ALCOHOL 1.4 % OP SOLN
1.0000 [drp] | Freq: Four times a day (QID) | OPHTHALMIC | Status: DC | PRN
  Filled 2019-12-07: qty 15

## 2019-12-07 NOTE — Plan of Care (Signed)

## 2019-12-07 NOTE — Progress Notes (Signed)
Patient noted to have increased effort to breath. Oxygen saturation stable on 2 liters oxygen.  Patient stated he was okay then had a coughing episode and could not catch his breath after.  Spoke to Quincy Carnes NP about episode and Ativan was ordered.  Pt notably breathing easier after scheduled inhaler, Methylprednisolone and ativan.  No change in saturations after medications given.

## 2019-12-07 NOTE — Progress Notes (Signed)
Progress Note    Bryan Kelley  ASN:[id number] DOB: Mar 22, 1956  DOA: 12/06/2019 PCP: Patient, No Pcp Per      Brief Narrative:    Medical records reviewed and are as summarized below:  Bryan Kelley is an 64 y.o. male  with medical history significant of COPD, asthma, GERD, left eye poor vision, BPH, alcohol abuse, depression, who presents with shortness of breath.      Assessment/Plan:   Principal Problem:   Acute respiratory disease due to COVID-19 virus Active Problems:   GERD   BPH (benign prostatic hyperplasia)   Alcohol abuse   COPD exacerbation (HCC)   Acute on chronic respiratory failure with hypoxia (HCC)   Nausea vomiting and diarrhea   Acute on chronic respiratory failure with hypoxia due to acute respiratory disease due to COVID-19 virus and COPD exacerbation:  Continue bronchodilators, IV steroids and IV remdesivir.  Continue oxygen via nasal cannula and taper off as able.  GERD: -ppi  BPH (benign prostatic hyperplasia): -flomax  Alcohol abuse: -CiWA protocol  Nausea vomiting and diarrhea: Most likely due to COVID-19 infection.  Discontinue IV fluids.  Antiemetics as needed.  Body mass index is 27.44 kg/m.   Family Communication/Anticipated D/C date and plan/Code Status   DVT prophylaxis: Lovenox Code Status: Full code Family Communication: Plan discussed with patient Disposition Plan: Patient is from home.  Possible discharge to home in 3 to 4 days when shortness of breath and hypoxemia improves.      Subjective:   He complains of cough and shortness of breath.  Objective:    Vitals:   12/07/19 0423 12/07/19 0742 12/07/19 1200 12/07/19 1700  BP: (!) 150/84 136/87 (!) 165/86 112/65  Pulse: 68 68 70 80  Resp: (!) 26 18  18   Temp:  (!) 96.3 F (35.7 C)    TempSrc:  Oral    SpO2: 97% 99% 96% 96%  Weight:      Height:        Intake/Output Summary (Last 24 hours) at 12/07/2019 1803 Last data filed at 12/07/2019 0600 Gross  per 24 hour  Intake 152.71 ml  Output --  Net 152.71 ml   Filed Weights   12/06/19 0944  Weight: 77.1 kg    Exam:  GEN: NAD SKIN: No rash EYES: EOMI ENT: MMM CV: RRR PULM: Decreased air entry bilaterally, bilateral expiratory wheezing, no rales heard ABD: soft, ND, NT, +BS CNS: AAO x 3, non focal EXT: No edema or tenderness   Data Reviewed:   I have personally reviewed following labs and imaging studies:  Labs: Labs show the following:   Basic Metabolic Panel: Recent Labs  Lab 12/06/19 0951 12/07/19 0417  NA 133*  --   K 3.7  --   CL 95*  --   CO2 28  --   GLUCOSE 129*  --   BUN 11  --   CREATININE 0.95  --   CALCIUM 9.0  --   MG  --  2.1   GFR Estimated Creatinine Clearance: 78.8 mL/min (by C-G formula based on SCr of 0.95 mg/dL). Liver Function Tests: Recent Labs  Lab 12/06/19 1724  AST 43*  ALT 45*  ALKPHOS 77  BILITOT 1.0  PROT 7.1  ALBUMIN 3.7   No results for input(s): LIPASE, AMYLASE in the last 168 hours. No results for input(s): AMMONIA in the last 168 hours. Coagulation profile No results for input(s): INR, PROTIME in the last 168 hours.  CBC:  Recent Labs  Lab 12/06/19 0951 12/07/19 0417  WBC 7.9 8.2  NEUTROABS 4.9 6.3  HGB 15.3 14.1  HCT 45.1 42.1  MCV 87.9 88.8  PLT 243 216   Cardiac Enzymes: No results for input(s): CKTOTAL, CKMB, CKMBINDEX, TROPONINI in the last 168 hours. BNP (last 3 results) No results for input(s): PROBNP in the last 8760 hours. CBG: No results for input(s): GLUCAP in the last 168 hours. D-Dimer: No results for input(s): DDIMER in the last 72 hours. Hgb A1c: No results for input(s): HGBA1C in the last 72 hours. Lipid Profile: Recent Labs    12/06/19 1724  TRIG 23   Thyroid function studies: No results for input(s): TSH, T4TOTAL, T3FREE, THYROIDAB in the last 72 hours.  Invalid input(s): FREET3 Anemia work up: Recent Labs    12/06/19 1724 12/07/19 0417  FERRITIN 87 80   Sepsis  Labs: Recent Labs  Lab 12/06/19 0951 12/06/19 1724 12/07/19 0417  PROCALCITON  --  <0.10  --   WBC 7.9  --  8.2    Microbiology Recent Results (from the past 240 hour(s))  Respiratory Panel by RT PCR (Flu A&B, Covid) - Nasopharyngeal Swab     Status: Abnormal   Collection Time: 12/06/19 12:01 PM   Specimen: Nasopharyngeal Swab  Result Value Ref Range Status   SARS Coronavirus 2 by RT PCR POSITIVE (A) NEGATIVE Final    Comment: RESULT CALLED TO, READ BACK BY AND VERIFIED WITH: MITCH BARKER 12/06/19 1427 KLW (NOTE) SARS-CoV-2 target nucleic acids are DETECTED. SARS-CoV-2 RNA is generally detectable in upper respiratory specimens  during the acute phase of infection. Positive results are indicative of the presence of the identified virus, but do not rule out bacterial infection or co-infection with other pathogens not detected by the test. Clinical correlation with patient history and other diagnostic information is necessary to determine patient infection status. The expected result is Negative. Fact Sheet for Patients:  PinkCheek.be Fact Sheet for Healthcare Providers: GravelBags.it This test is not yet approved or cleared by the [place] FDA and  has been authorized for detection and/or diagnosis of SARS-CoV-2 by FDA under an Emergency Use Authorization (EUA).  This EUA will remain in effect (meaning this test can be used) for the  duration of  the COVID-19 declaration under Section 564(b)(1) of the Act, 21 U.S.C. section 360bbb-3(b)(1), unless the authorization is terminated or revoked sooner.    Influenza A by PCR NEGATIVE NEGATIVE Final   Influenza B by PCR NEGATIVE NEGATIVE Final    Comment: (NOTE) The Xpert Xpress SARS-CoV-2/FLU/RSV assay is intended as an aid in  the diagnosis of influenza from Nasopharyngeal swab specimens and  should not be used as a sole basis for treatment. Nasal washings and   aspirates are unacceptable for Xpert Xpress SARS-CoV-2/FLU/RSV  testing. Fact Sheet for Patients: PinkCheek.be Fact Sheet for Healthcare Providers: GravelBags.it This test is not yet approved or cleared by the [place] FDA and  has been authorized for detection and/or diagnosis of SARS-CoV-2 by  FDA under an Emergency Use Authorization (EUA). This EUA will remain  in effect (meaning this test can be used) for the duration of the  Covid-19 declaration under Section 564(b)(1) of the Act, 21  U.S.C. section 360bbb-3(b)(1), unless the authorization is  terminated or revoked. Performed at [facility], [address] [place], [place] [address]     Procedures and diagnostic studies:  DG Chest 2 View  Result Date: 12/06/2019 CLINICAL DATA:  Increasing shortness of  breath. EXAM: CHEST - 2 VIEW COMPARISON:  Chest x-ray 11/29/2019 and chest CT 11/30/2019 FINDINGS: The heart is normal in size. Stable tortuosity and calcification of the thoracic aorta. Stable emphysematous changes and mild pulmonary scarring but no acute overlying pulmonary findings. The bony thorax is intact. IMPRESSION: Emphysematous changes and pulmonary scarring but no acute overlying pulmonary process. Electronically Signed   By: Rudie Meyer M.D.   On: 12/06/2019 10:14    Medications:   . vitamin C  500 mg Oral Daily  . brimonidine  1 drop Left Eye Daily  . brinzolamide  1 drop Left Eye Daily  . dextromethorphan-guaiFENesin  1 tablet Oral BID  . enoxaparin (LOVENOX) injection  40 mg Subcutaneous Q24H  . folic acid  1 mg Oral Daily  . ipratropium  2 puff Inhalation Q4H  . LORazepam  0-4 mg Intravenous Q6H   Followed by  . [START ON 12/08/2019] LORazepam  0-4 mg Intravenous Q12H  . methylPREDNISolone (SOLU-MEDROL) injection  40 mg Intravenous Q12H  . mometasone-formoterol  2 puff Inhalation BID  . multivitamin with minerals  1 tablet Oral Daily   . pantoprazole  40 mg Oral Daily  . tamsulosin  0.4 mg Oral Q breakfast  . thiamine  100 mg Oral Daily   Or  . thiamine  100 mg Intravenous Daily  . zinc sulfate  220 mg Oral Daily   Continuous Infusions: . sodium chloride 75 mL/hr at 12/07/19 0600  . remdesivir 100 mg in NS 100 mL 100 mg (12/07/19 1039)     LOS: 1 day   Zekiah Coen  [facility]     12/07/2019, 6:03 PM

## 2019-12-07 NOTE — Progress Notes (Signed)
Nurse reports patient with increased work of breathing but stable oxygen saturation.  Review of record/current and precipitating factors, likely cough, brochospasm (just had inhalers) or mucous plug.  Ordered 1 mg IV ativan to help with bronchospasm management.   Also noted patient placed on CIWA protocol. However patient without alcohol use in 2-3 years. CIWA protocol discontinued

## 2019-12-07 NOTE — Plan of Care (Signed)
  Problem: Coping: Goal: Psychosocial and spiritual needs will be supported Outcome: Progressing   

## 2019-12-08 LAB — CBC WITH DIFFERENTIAL/PLATELET
Abs Immature Granulocytes: 0.09 10*3/uL — ABNORMAL HIGH (ref 0.00–0.07)
Basophils Absolute: 0 10*3/uL (ref 0.0–0.1)
Basophils Relative: 0 %
Eosinophils Absolute: 0 10*3/uL (ref 0.0–0.5)
Eosinophils Relative: 0 %
HCT: 41.3 % (ref 39.0–52.0)
Hemoglobin: 13.6 g/dL (ref 13.0–17.0)
Immature Granulocytes: 1 %
Lymphocytes Relative: 10 %
Lymphs Abs: 1.2 10*3/uL (ref 0.7–4.0)
MCH: 29.8 pg (ref 26.0–34.0)
MCHC: 32.9 g/dL (ref 30.0–36.0)
MCV: 90.6 fL (ref 80.0–100.0)
Monocytes Absolute: 0.5 10*3/uL (ref 0.1–1.0)
Monocytes Relative: 4 %
Neutro Abs: 10 10*3/uL — ABNORMAL HIGH (ref 1.7–7.7)
Neutrophils Relative %: 85 %
Platelets: 215 10*3/uL (ref 150–400)
RBC: 4.56 MIL/uL (ref 4.22–5.81)
RDW: 13.5 % (ref 11.5–15.5)
WBC: 11.7 10*3/uL — ABNORMAL HIGH (ref 4.0–10.5)
nRBC: 0 % (ref 0.0–0.2)

## 2019-12-08 LAB — C-REACTIVE PROTEIN: CRP: 2.2 mg/dL — ABNORMAL HIGH (ref <1.0)

## 2019-12-08 LAB — FERRITIN: Ferritin: 74 ng/mL (ref 24–336)

## 2019-12-08 LAB — FIBRIN DERIVATIVES D-DIMER (ARMC ONLY): Fibrin derivatives D-dimer (ARMC): 299.87 ng/mL (FEU) (ref 0.00–499.00)

## 2019-12-08 LAB — MAGNESIUM: Magnesium: 1.9 mg/dL (ref 1.7–2.4)

## 2019-12-08 MED ORDER — HYDROCHLOROTHIAZIDE 12.5 MG PO CAPS: 12.5000 mg | ORAL_CAPSULE | Freq: Every day | ORAL | Status: DC

## 2019-12-08 MED ORDER — AMLODIPINE BESYLATE 5 MG PO TABS: 5.0000 mg | ORAL_TABLET | Freq: Every day | ORAL | Status: DC

## 2019-12-08 MED ORDER — METHYLPREDNISOLONE SODIUM SUCC 125 MG IJ SOLR
60.0000 mg | Freq: Three times a day (TID) | INTRAMUSCULAR | Status: DC
  Administered 2019-12-08 – 2019-12-11 (×9): 60 mg via INTRAVENOUS
  Filled 2019-12-08 (×9): qty 2

## 2019-12-08 MED ORDER — AZITHROMYCIN 500 MG PO TABS
500.0000 mg | ORAL_TABLET | Freq: Every day | ORAL | Status: DC
  Administered 2019-12-08 – 2019-12-10 (×3): 500 mg via ORAL
  Filled 2019-12-08 (×2): qty 1

## 2019-12-08 NOTE — Plan of Care (Signed)
  Problem: Coping: Goal: Psychosocial and spiritual needs will be supported Outcome: Progressing   Problem: Respiratory: Goal: Will maintain a patent airway Outcome: Progressing   Problem: Respiratory: Goal: Complications related to the disease process, condition or treatment will be avoided or minimized Outcome: Progressing   Problem: Education: Goal: Knowledge of General Education information will improve Description: Including pain rating scale, medication(s)/side effects and non-pharmacologic comfort measures Outcome: Progressing   Problem: Health Behavior/Discharge Planning: Goal: Ability to manage health-related needs will improve Outcome: Progressing

## 2019-12-08 NOTE — Progress Notes (Signed)
Patient refuses blood pressure medications.  Patient informed his blood pressure has been elevated and is provided with recent blood pressure readings as well as what  constitues normal blood pressure ranges.   Patient states, "I have never taken blood pressure medicine and I am not going to take it now."  Patient provided education on blood pressure management and the results of longterm uncontrolled hypertension, to include heart failure and stroke.  Patient reiterates he will not start taking blood pressure medications.

## 2019-12-08 NOTE — Progress Notes (Addendum)
Progress Note    Bryan Kelley  TSV:[id number] DOB: 15-Jun-1956  DOA: 12/06/2019 PCP: Patient, No Pcp Per      Brief Narrative:    Medical records reviewed and are as summarized below:  Bryan Kelley is an 64 y.o. male  with medical history significant of COPD, asthma, GERD, left eye poor vision, BPH, alcohol abuse, depression, who presents with shortness of breath.      Assessment/Plan:   Principal Problem:   Acute respiratory disease due to COVID-19 virus Active Problems:   GERD   BPH (benign prostatic hyperplasia)   Alcohol abuse   COPD exacerbation (HCC)   Acute on chronic respiratory failure with hypoxia (HCC)   Nausea vomiting and diarrhea   Acute on chronic respiratory failure with hypoxia due to acute respiratory disease due to COVID-19 virus and COPD exacerbation:  Continue bronchodilators, IV steroids and IV remdesivir.  Increased dose and frequency of IV Solu-Medrol.  Added azithromycin.  Continue oxygen via nasal cannula and taper off as able.  Hypertension: Start antihypertensives (HCTZ and amlodipine)  GERD: -ppi  BPH (benign prostatic hyperplasia): -flomax  Alcohol abuse: -CiWA protocol  Nausea vomiting and diarrhea:  Improved. Antiemetics as needed.  Body mass index is 27.44 kg/m.   Family Communication/Anticipated D/C date and plan/Code Status   DVT prophylaxis: Lovenox Code Status: Full code Family Communication: Plan discussed with patient Disposition Plan: Patient is from home.  Possible discharge to home in 2 to 3 days when shortness of breath and hypoxemia improves.      Subjective:   He complains of cough, wheezing and shortness of breath.  No chest pain or fever.  Objective:    Vitals:   12/07/19 2328 12/08/19 0741 12/08/19 1221 12/08/19 1236  BP: (!) 155/88 (!) 167/88 (!) 167/106   Pulse: 62 82 60   Resp: 18 20 18    Temp: 97.7 F (36.5 C)     TempSrc: Oral     SpO2: 100% 98% 94% 95%  Weight:      Height:         Intake/Output Summary (Last 24 hours) at 12/08/2019 1439 Last data filed at 12/08/2019 1013 Gross per 24 hour  Intake 1079.82 ml  Output 300 ml  Net 779.82 ml   Filed Weights   12/06/19 0944  Weight: 77.1 kg    Exam:  GEN: NAD SKIN: No rash EYES: EOMI ENT: MMM CV: RRR PULM: Decreased air entry bilaterally.  He has more diffuse wheezing bilaterally ABD: soft, ND, NT, +BS CNS: AAO x 3, non focal EXT: No edema or tenderness   Data Reviewed:   I have personally reviewed following labs and imaging studies:  Labs: Labs show the following:   Basic Metabolic Panel: Recent Labs  Lab 12/06/19 0951 12/07/19 0417 12/08/19 0314  NA 133*  --   --   K 3.7  --   --   CL 95*  --   --   CO2 28  --   --   GLUCOSE 129*  --   --   BUN 11  --   --   CREATININE 0.95  --   --   CALCIUM 9.0  --   --   MG  --  2.1 1.9   GFR Estimated Creatinine Clearance: 78.8 mL/min (by C-G formula based on SCr of 0.95 mg/dL). Liver Function Tests: Recent Labs  Lab 12/06/19 1724  AST 43*  ALT 45*  ALKPHOS 77  BILITOT 1.0  PROT 7.1  ALBUMIN 3.7   No results for input(s): LIPASE, AMYLASE in the last 168 hours. No results for input(s): AMMONIA in the last 168 hours. Coagulation profile No results for input(s): INR, PROTIME in the last 168 hours.  CBC: Recent Labs  Lab 12/06/19 0951 12/07/19 0417 12/08/19 0314  WBC 7.9 8.2 11.7*  NEUTROABS 4.9 6.3 10.0*  HGB 15.3 14.1 13.6  HCT 45.1 42.1 41.3  MCV 87.9 88.8 90.6  PLT 243 216 215   Cardiac Enzymes: No results for input(s): CKTOTAL, CKMB, CKMBINDEX, TROPONINI in the last 168 hours. BNP (last 3 results) No results for input(s): PROBNP in the last 8760 hours. CBG: No results for input(s): GLUCAP in the last 168 hours. D-Dimer: No results for input(s): DDIMER in the last 72 hours. Hgb A1c: No results for input(s): HGBA1C in the last 72 hours. Lipid Profile: Recent Labs    12/06/19 1724  TRIG 23   Thyroid function  studies: No results for input(s): TSH, T4TOTAL, T3FREE, THYROIDAB in the last 72 hours.  Invalid input(s): FREET3 Anemia work up: Recent Labs    12/07/19 0417 12/08/19 0314  FERRITIN 80 74   Sepsis Labs: Recent Labs  Lab 12/06/19 0951 12/06/19 1724 12/07/19 0417 12/08/19 0314  PROCALCITON  --  <0.10  --   --   WBC 7.9  --  8.2 11.7*    Microbiology Recent Results (from the past 240 hour(s))  Respiratory Panel by RT PCR (Flu A&B, Covid) - Nasopharyngeal Swab     Status: Abnormal   Collection Time: 12/06/19 12:01 PM   Specimen: Nasopharyngeal Swab  Result Value Ref Range Status   SARS Coronavirus 2 by RT PCR POSITIVE (A) NEGATIVE Final    Comment: RESULT CALLED TO, READ BACK BY AND VERIFIED WITH: MITCH BARKER 12/06/19 1427 KLW (NOTE) SARS-CoV-2 target nucleic acids are DETECTED. SARS-CoV-2 RNA is generally detectable in upper respiratory specimens  during the acute phase of infection. Positive results are indicative of the presence of the identified virus, but do not rule out bacterial infection or co-infection with other pathogens not detected by the test. Clinical correlation with patient history and other diagnostic information is necessary to determine patient infection status. The expected result is Negative. Fact Sheet for Patients:  PinkCheek.be Fact Sheet for Healthcare Providers: GravelBags.it This test is not yet approved or cleared by the [place] FDA and  has been authorized for detection and/or diagnosis of SARS-CoV-2 by FDA under an Emergency Use Authorization (EUA).  This EUA will remain in effect (meaning this test can be used) for the  duration of  the COVID-19 declaration under Section 564(b)(1) of the Act, 21 U.S.C. section 360bbb-3(b)(1), unless the authorization is terminated or revoked sooner.    Influenza A by PCR NEGATIVE NEGATIVE Final   Influenza B by PCR NEGATIVE NEGATIVE Final     Comment: (NOTE) The Xpert Xpress SARS-CoV-2/FLU/RSV assay is intended as an aid in  the diagnosis of influenza from Nasopharyngeal swab specimens and  should not be used as a sole basis for treatment. Nasal washings and  aspirates are unacceptable for Xpert Xpress SARS-CoV-2/FLU/RSV  testing. Fact Sheet for Patients: PinkCheek.be Fact Sheet for Healthcare Providers: GravelBags.it This test is not yet approved or cleared by the [place] FDA and  has been authorized for detection and/or diagnosis of SARS-CoV-2 by  FDA under an Emergency Use Authorization (EUA). This EUA will remain  in effect (meaning this test can be used) for the duration of the  Covid-19 declaration under Section 564(b)(1) of the Act, 21  U.S.C. section 360bbb-3(b)(1), unless the authorization is  terminated or revoked. Performed at [facility], [address] [place], [place] [address]     Procedures and diagnostic studies:  No results found.  Medications:   . vitamin C  500 mg Oral Daily  . azithromycin  500 mg Oral Daily  . brimonidine  1 drop Left Eye Daily  . brinzolamide  1 drop Left Eye Daily  . dextromethorphan-guaiFENesin  1 tablet Oral BID  . enoxaparin (LOVENOX) injection  40 mg Subcutaneous Q24H  . folic acid  1 mg Oral Daily  . ipratropium  2 puff Inhalation Q4H  . LORazepam  0-4 mg Intravenous Q6H   Followed by  . LORazepam  0-4 mg Intravenous Q12H  . methylPREDNISolone (SOLU-MEDROL) injection  60 mg Intravenous Q8H  . mometasone-formoterol  2 puff Inhalation BID  . multivitamin with minerals  1 tablet Oral Daily  . pantoprazole  40 mg Oral Daily  . tamsulosin  0.4 mg Oral Q breakfast  . thiamine  100 mg Oral Daily   Or  . thiamine  100 mg Intravenous Daily  . zinc sulfate  220 mg Oral Daily   Continuous Infusions: . remdesivir 100 mg in NS 100 mL 100 mg (12/08/19 1013)     LOS: 2 days   Anjeli Casad  [facility]     12/08/2019, 2:39 PM

## 2019-12-08 NOTE — Progress Notes (Signed)
Patient alert and oriented x4 and independent in the room. Patient requested a massage and back rub from the NT. NT informed this RN. This RN spoke with patient and informed patient that NT will not be giving a massage or back rub to the patient at this time. Will continue to monitor.

## 2019-12-08 NOTE — Progress Notes (Signed)
MD paged:  Patient's BP elevated today, last two BPs 155/90 and 185/87, respectively.

## 2019-12-09 LAB — MAGNESIUM: Magnesium: 2.1 mg/dL (ref 1.7–2.4)

## 2019-12-09 LAB — CBC WITH DIFFERENTIAL/PLATELET
Abs Immature Granulocytes: 0.12 10*3/uL — ABNORMAL HIGH (ref 0.00–0.07)
Basophils Absolute: 0 10*3/uL (ref 0.0–0.1)
Basophils Relative: 0 %
Eosinophils Absolute: 0 10*3/uL (ref 0.0–0.5)
Eosinophils Relative: 0 %
HCT: 42.3 % (ref 39.0–52.0)
Hemoglobin: 14 g/dL (ref 13.0–17.0)
Immature Granulocytes: 1 %
Lymphocytes Relative: 10 %
Lymphs Abs: 1.4 10*3/uL (ref 0.7–4.0)
MCH: 29.7 pg (ref 26.0–34.0)
MCHC: 33.1 g/dL (ref 30.0–36.0)
MCV: 89.8 fL (ref 80.0–100.0)
Monocytes Absolute: 0.4 10*3/uL (ref 0.1–1.0)
Monocytes Relative: 3 %
Neutro Abs: 11.6 10*3/uL — ABNORMAL HIGH (ref 1.7–7.7)
Neutrophils Relative %: 86 %
Platelets: 214 10*3/uL (ref 150–400)
RBC: 4.71 MIL/uL (ref 4.22–5.81)
RDW: 13.2 % (ref 11.5–15.5)
WBC: 13.5 10*3/uL — ABNORMAL HIGH (ref 4.0–10.5)
nRBC: 0 % (ref 0.0–0.2)

## 2019-12-09 LAB — FIBRIN DERIVATIVES D-DIMER (ARMC ONLY): Fibrin derivatives D-dimer (ARMC): 268.96 ng/mL (FEU) (ref 0.00–499.00)

## 2019-12-09 LAB — C-REACTIVE PROTEIN: CRP: 1 mg/dL — ABNORMAL HIGH (ref <1.0)

## 2019-12-09 LAB — FERRITIN: Ferritin: 79 ng/mL (ref 24–336)

## 2019-12-09 MED ORDER — ERYTHROMYCIN 5 MG/GM OP OINT
TOPICAL_OINTMENT | Freq: Three times a day (TID) | OPHTHALMIC | Status: DC
  Administered 2019-12-09 – 2019-12-11 (×6): 1 via OPHTHALMIC
  Filled 2019-12-09: qty 1

## 2019-12-09 MED ORDER — OXYCODONE-ACETAMINOPHEN 5-325 MG PO TABS
1.0000 | ORAL_TABLET | Freq: Four times a day (QID) | ORAL | Status: DC | PRN
  Administered 2019-12-09 (×3): 1 via ORAL
  Filled 2019-12-09 (×4): qty 1

## 2019-12-09 NOTE — Progress Notes (Signed)
Pts wife updated on pts condition.

## 2019-12-09 NOTE — Progress Notes (Signed)
Progress Note    Bryan Kelley  ASU:[id number] DOB: [birth year]  DOA: 12/06/2019 PCP: Patient, No Pcp Per      Brief Narrative:    Medical records reviewed and are as summarized below:  Bryan Kelley is an 64 y.o. male  with medical history significant of COPD, asthma, GERD, left eye poor vision, BPH, alcohol abuse, depression, who presents with shortness of breath.      Assessment/Plan:   Principal Problem:   Acute respiratory disease due to COVID-19 virus Active Problems:   GERD   BPH (benign prostatic hyperplasia)   Alcohol abuse   COPD exacerbation (HCC)   Acute on chronic respiratory failure with hypoxia (HCC)   Nausea vomiting and diarrhea   Acute on chronic respiratory failure with hypoxia due to acute respiratory disease due to COVID-19 virus and COPD exacerbation:  Continue bronchodilators, IV steroids and IV remdesivir. Continue oxygen via nasal cannula and taper off as able.  Hypertension: Patient refused to take any antihypertensives.  Monitor BP   GERD: -ppi  BPH (benign prostatic hyperplasia): Flomax  Left eye conjunctivitis Treat with erythromycin ointment.  Alcohol abuse: -CiWA protocol  Nausea vomiting and diarrhea:  Improved. Antiemetics as needed.  Body mass index is 27.44 kg/m.   Family Communication/Anticipated D/C date and plan/Code Status   DVT prophylaxis: Lovenox Code Status: Full code Family Communication: Plan discussed with patient Disposition Plan: Patient is from home.  Possible discharge to home in 2 to 3 days when shortness of breath and hypoxemia improves.      Subjective:   He still complains of shortness of breath, cough and wheezing.  He also complains of discharge from the left eye.  He said he has had surgery on the left eye in the past.  Objective:    Vitals:   12/08/19 1950 12/08/19 2300 12/09/19 0438 12/09/19 1619  BP: (!) 177/100 (!) 151/82 (!) 117/55 (!) 171/83  Pulse: 68 62 66   Resp: 18  19    Temp: (!) 97.5 F (36.4 C) 97.6 F (36.4 C) (!) 97.5 F (36.4 C) 97.8 F (36.6 C)  TempSrc: Oral Oral Oral Oral  SpO2: 95% 94% 97% 96%  Weight:      Height:        Intake/Output Summary (Last 24 hours) at 12/09/2019 1755 Last data filed at 12/09/2019 0400 Gross per 24 hour  Intake 540 ml  Output --  Net 540 ml   Filed Weights   12/06/19 0944  Weight: 77.1 kg    Exam:  GEN: NAD SKIN: No rash EYES: Crusting noted on the left eye.  No redness noted.  Left eye looks abnormal. ENT: MMM CV: RRR PULM: Reduced air entry on both sides.  Diffuse expiratory wheezing bilaterally. ABD: soft, ND, NT, +BS CNS: AAO x 3, non focal EXT: No edema or tenderness   Data Reviewed:   I have personally reviewed following labs and imaging studies:  Labs: Labs show the following:   Basic Metabolic Panel: Recent Labs  Lab 12/06/19 0951 12/07/19 0417 12/08/19 0314 12/09/19 0703  NA 133*  --   --   --   K 3.7  --   --   --   CL 95*  --   --   --   CO2 28  --   --   --   GLUCOSE 129*  --   --   --   BUN 11  --   --   --  CREATININE 0.95  --   --   --   CALCIUM 9.0  --   --   --   MG  --  2.1 1.9 2.1   GFR Estimated Creatinine Clearance: 78.8 mL/min (by C-G formula based on SCr of 0.95 mg/dL). Liver Function Tests: Recent Labs  Lab 12/06/19 1724  AST 43*  ALT 45*  ALKPHOS 77  BILITOT 1.0  PROT 7.1  ALBUMIN 3.7   No results for input(s): LIPASE, AMYLASE in the last 168 hours. No results for input(s): AMMONIA in the last 168 hours. Coagulation profile No results for input(s): INR, PROTIME in the last 168 hours.  CBC: Recent Labs  Lab 12/06/19 0951 12/07/19 0417 12/08/19 0314 12/09/19 0703  WBC 7.9 8.2 11.7* 13.5*  NEUTROABS 4.9 6.3 10.0* 11.6*  HGB 15.3 14.1 13.6 14.0  HCT 45.1 42.1 41.3 42.3  MCV 87.9 88.8 90.6 89.8  PLT 243 216 215 214   Cardiac Enzymes: No results for input(s): CKTOTAL, CKMB, CKMBINDEX, TROPONINI in the last 168 hours. BNP (last 3  results) No results for input(s): PROBNP in the last 8760 hours. CBG: No results for input(s): GLUCAP in the last 168 hours. D-Dimer: No results for input(s): DDIMER in the last 72 hours. Hgb A1c: No results for input(s): HGBA1C in the last 72 hours. Lipid Profile: No results for input(s): CHOL, HDL, LDLCALC, TRIG, CHOLHDL, LDLDIRECT in the last 72 hours. Thyroid function studies: No results for input(s): TSH, T4TOTAL, T3FREE, THYROIDAB in the last 72 hours.  Invalid input(s): FREET3 Anemia work up: Recent Labs    12/08/19 0314 12/09/19 0703  FERRITIN 74 79   Sepsis Labs: Recent Labs  Lab 12/06/19 0951 12/06/19 1724 12/07/19 0417 12/08/19 0314 12/09/19 0703  PROCALCITON  --  <0.10  --   --   --   WBC 7.9  --  8.2 11.7* 13.5*    Microbiology Recent Results (from the past 240 hour(s))  Respiratory Panel by RT PCR (Flu A&B, Covid) - Nasopharyngeal Swab     Status: Abnormal   Collection Time: 12/06/19 12:01 PM   Specimen: Nasopharyngeal Swab  Result Value Ref Range Status   SARS Coronavirus 2 by RT PCR POSITIVE (A) NEGATIVE Final    Comment: RESULT CALLED TO, READ BACK BY AND VERIFIED WITH: MITCH BARKER 12/06/19 1427 KLW (NOTE) SARS-CoV-2 target nucleic acids are DETECTED. SARS-CoV-2 RNA is generally detectable in upper respiratory specimens  during the acute phase of infection. Positive results are indicative of the presence of the identified virus, but do not rule out bacterial infection or co-infection with other pathogens not detected by the test. Clinical correlation with patient history and other diagnostic information is necessary to determine patient infection status. The expected result is Negative. Fact Sheet for Patients:  PinkCheek.be Fact Sheet for Healthcare Providers: GravelBags.it This test is not yet approved or cleared by the [place] FDA and  has been authorized for detection and/or  diagnosis of SARS-CoV-2 by FDA under an Emergency Use Authorization (EUA).  This EUA will remain in effect (meaning this test can be used) for the  duration of  the COVID-19 declaration under Section 564(b)(1) of the Act, 21 U.S.C. section 360bbb-3(b)(1), unless the authorization is terminated or revoked sooner.    Influenza A by PCR NEGATIVE NEGATIVE Final   Influenza B by PCR NEGATIVE NEGATIVE Final    Comment: (NOTE) The Xpert Xpress SARS-CoV-2/FLU/RSV assay is intended as an aid in  the diagnosis of influenza from Nasopharyngeal swab specimens  and  should not be used as a sole basis for treatment. Nasal washings and  aspirates are unacceptable for Xpert Xpress SARS-CoV-2/FLU/RSV  testing. Fact Sheet for Patients: https://www.moore.com/ Fact Sheet for Healthcare Providers: https://www.young.biz/ This test is not yet approved or cleared by the [place] FDA and  has been authorized for detection and/or diagnosis of SARS-CoV-2 by  FDA under an Emergency Use Authorization (EUA). This EUA will remain  in effect (meaning this test can be used) for the duration of the  Covid-19 declaration under Section 564(b)(1) of the Act, 21  U.S.C. section 360bbb-3(b)(1), unless the authorization is  terminated or revoked. Performed at [facility], [address] [place], [place] [address]     Procedures and diagnostic studies:  No results found.  Medications:   . vitamin C  500 mg Oral Daily  . azithromycin  500 mg Oral Daily  . brimonidine  1 drop Left Eye Daily  . brinzolamide  1 drop Left Eye Daily  . dextromethorphan-guaiFENesin  1 tablet Oral BID  . enoxaparin (LOVENOX) injection  40 mg Subcutaneous Q24H  . erythromycin   Left Eye Q8H  . folic acid  1 mg Oral Daily  . ipratropium  2 puff Inhalation Q4H  . LORazepam  0-4 mg Intravenous Q12H  . methylPREDNISolone (SOLU-MEDROL) injection  60 mg Intravenous Q8H  .  mometasone-formoterol  2 puff Inhalation BID  . multivitamin with minerals  1 tablet Oral Daily  . pantoprazole  40 mg Oral Daily  . tamsulosin  0.4 mg Oral Q breakfast  . thiamine  100 mg Oral Daily   Or  . thiamine  100 mg Intravenous Daily  . zinc sulfate  220 mg Oral Daily   Continuous Infusions: . remdesivir 100 mg in NS 100 mL Stopped (12/09/19 1033)     LOS: 3 days   Loyde Orth  [facility]     12/09/2019, 5:54 PM

## 2019-12-09 NOTE — TOC Initial Note (Signed)
Transition of Care [facility]) - Initial/Assessment Note    Patient Details  Name: Bryan Kelley MRN: [id number] Date of Birth: 29-Sep-1956  Transition of Care [facility]) CM/SW Contact:    Elease Hashimoto, LCSW Phone Number: 12/09/2019, 11:15 AM  Clinical Narrative:   Spoke with pt per telephone due to in [place] unit. He reports his nephew lives with him. He is disabled and can drive if he wants. Discussed PCP he has a Medicaid card and he will need to follow up with the name on his card or if he wants to switch them he will need to talk with Medicaid worker. He is aware of this. He has medication coverage and feels his needs are met. He hopes to go home soon, since he feels better now. He is independent with ambulation and has no equipment needs. Will sign off if further needs let worker know                Expected Discharge Plan: Home/Self Care Barriers to Discharge: Continued Medical Work up   Patient Goals and CMS Choice Patient states their goals for this hospitalization and ongoing recovery are:: Go back home and feel better-I do now      Expected Discharge Plan and Services Expected Discharge Plan: Home/Self Care In-house Referral: Clinical Social Work Discharge Planning Services: NA   Living arrangements for the past 2 months: Single Family Home                                      Prior Living Arrangements/Services Living arrangements for the past 2 months: Single Family Home Lives with:: Relatives(nephew)   Do you feel safe going back to the place where you live?: Yes               Activities of Daily Living Home Assistive Devices/Equipment: None ADL Screening (condition at time of admission) Patient's cognitive ability adequate to safely complete daily activities?: Yes Is the patient deaf or have difficulty hearing?: No Does the patient have difficulty seeing, even when wearing glasses/contacts?: Yes Does the patient have difficulty concentrating, remembering,  or making decisions?: No Patient able to express need for assistance with ADLs?: No Does the patient have difficulty dressing or bathing?: No Independently performs ADLs?: Yes (appropriate for developmental age) Does the patient have difficulty walking or climbing stairs?: No Weakness of Legs: None Weakness of Arms/Hands: None  Permission Sought/Granted                  Emotional Assessment Appearance:: Appears stated age Attitude/Demeanor/Rapport: Engaged Affect (typically observed): Accepting Orientation: : Oriented to Self, Oriented to Place, Oriented to  Time, Oriented to Situation   Psych Involvement: No (comment)  Admission diagnosis:  Shortness of breath [R06.02] COPD exacerbation (HCC) [J44.1] Acute respiratory disease due to COVID-19 virus [U07.1, J06.9] Patient Active Problem List   Diagnosis Date Noted  . Acute on chronic respiratory failure with hypoxia ([place]) 12/06/2019  . Nausea vomiting and diarrhea 12/06/2019  . Acute respiratory disease due to COVID-19 virus 12/06/2019  . COPD (chronic obstructive pulmonary disease) ([place]) 10/30/2019  . Hyperglycemia   . COPD with acute exacerbation ([place]) 10/29/2019  . COPD exacerbation ([place]) 10/29/2019  . Epigastric abdominal pain 08/12/2012  . Alcohol abuse 03/14/2012  . Elevated BP 12/07/2011  . ED (erectile dysfunction) 07/28/2011  . NICOTINE ADDICTION 10/29/2009  . ACUTE BRONCHITIS 05/07/2009  . 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 ANKLE PAIN, RIGHT 10/04/2008  . FATIGUE 10/04/2008  . ANXIETY 02/17/2008  . ASTHMA 02/17/2008  . GERD 02/17/2008  . BPH (benign prostatic hyperplasia) 02/17/2008   PCP:  Patient, No Pcp Per Pharmacy:   [place], [place] - [place] [address] Phone: [phone] Fax: [phone]  [place], [place], [place] [address] Phone: [phone] Fax:  [phone]     Social Determinants of Health (SDOH) Interventions    Readmission Risk Interventions No flowsheet data found.

## 2019-12-09 NOTE — Plan of Care (Signed)
  Problem: Education: Goal: Knowledge of risk factors and measures for prevention of condition will improve Outcome: Progressing   Problem: Coping: Goal: Psychosocial and spiritual needs will be supported Outcome: Progressing   Problem: Respiratory: Goal: Will maintain a patent airway Outcome: Progressing Goal: Complications related to the disease process, condition or treatment will be avoided or minimized Outcome: Progressing   Problem: Education: Goal: Knowledge of General Education information will improve Description: Including pain rating scale, medication(s)/side effects and non-pharmacologic comfort measures Outcome: Progressing   Problem: Health Behavior/Discharge Planning: Goal: Ability to manage health-related needs will improve Outcome: Progressing   Problem: Clinical Measurements: Goal: Ability to maintain clinical measurements within normal limits will improve Outcome: Progressing Goal: Will remain free from infection Outcome: Progressing Goal: Diagnostic test results will improve Outcome: Progressing Goal: Respiratory complications will improve Outcome: Progressing Goal: Cardiovascular complication will be avoided Outcome: Progressing   Problem: Activity: Goal: Risk for activity intolerance will decrease Outcome: Progressing   Problem: Nutrition: Goal: Adequate nutrition will be maintained Outcome: Progressing   Problem: Coping: Goal: Level of anxiety will decrease Outcome: Progressing   Problem: Elimination: Goal: Will not experience complications related to bowel motility Outcome: Progressing Goal: Will not experience complications related to urinary retention Outcome: Progressing   Problem: Pain Managment: Goal: General experience of comfort will improve Outcome: Progressing   Problem: Safety: Goal: Ability to remain free from injury will improve Outcome: Progressing   Problem: Skin Integrity: Goal: Risk for impaired skin integrity will  decrease Outcome: Progressing   Problem: Clinical Measurements: Goal: Ability to maintain clinical measurements within normal limits will improve Outcome: Progressing Goal: Will remain free from infection Outcome: Progressing Goal: Diagnostic test results will improve Outcome: Progressing Goal: Respiratory complications will improve Outcome: Progressing Goal: Cardiovascular complication will be avoided Outcome: Progressing   Problem: Activity: Goal: Risk for activity intolerance will decrease Outcome: Progressing   Problem: Coping: Goal: Level of anxiety will decrease Outcome: Progressing   Problem: Elimination: Goal: Will not experience complications related to bowel motility Outcome: Progressing Goal: Will not experience complications related to urinary retention Outcome: Progressing   Problem: Safety: Goal: Ability to remain free from injury will improve Outcome: Progressing   Problem: Skin Integrity: Goal: Risk for impaired skin integrity will decrease Outcome: Progressing

## 2019-12-10 LAB — CBC WITH DIFFERENTIAL/PLATELET
Abs Immature Granulocytes: 0.14 10*3/uL — ABNORMAL HIGH (ref 0.00–0.07)
Basophils Absolute: 0 10*3/uL (ref 0.0–0.1)
Basophils Relative: 0 %
Eosinophils Absolute: 0 10*3/uL (ref 0.0–0.5)
Eosinophils Relative: 0 %
HCT: 41.2 % (ref 39.0–52.0)
Hemoglobin: 14 g/dL (ref 13.0–17.0)
Immature Granulocytes: 1 %
Lymphocytes Relative: 9 %
Lymphs Abs: 1.3 10*3/uL (ref 0.7–4.0)
MCH: 30 pg (ref 26.0–34.0)
MCHC: 34 g/dL (ref 30.0–36.0)
MCV: 88.4 fL (ref 80.0–100.0)
Monocytes Absolute: 0.3 10*3/uL (ref 0.1–1.0)
Monocytes Relative: 2 %
Neutro Abs: 12.8 10*3/uL — ABNORMAL HIGH (ref 1.7–7.7)
Neutrophils Relative %: 88 %
Platelets: 232 10*3/uL (ref 150–400)
RBC: 4.66 MIL/uL (ref 4.22–5.81)
RDW: 12.8 % (ref 11.5–15.5)
WBC: 14.5 10*3/uL — ABNORMAL HIGH (ref 4.0–10.5)
nRBC: 0 % (ref 0.0–0.2)

## 2019-12-10 LAB — C-REACTIVE PROTEIN: CRP: 0.9 mg/dL (ref <1.0)

## 2019-12-10 LAB — FERRITIN: Ferritin: 69 ng/mL (ref 24–336)

## 2019-12-10 LAB — FIBRIN DERIVATIVES D-DIMER (ARMC ONLY): Fibrin derivatives D-dimer (ARMC): 276.75 ng/mL (FEU) (ref 0.00–499.00)

## 2019-12-10 LAB — MAGNESIUM: Magnesium: 2 mg/dL (ref 1.7–2.4)

## 2019-12-10 MED ORDER — OXYCODONE-ACETAMINOPHEN 5-325 MG PO TABS
2.0000 | ORAL_TABLET | Freq: Four times a day (QID) | ORAL | Status: DC | PRN
  Administered 2019-12-10: 02:00:00 2 via ORAL
  Filled 2019-12-10: qty 2

## 2019-12-10 MED ORDER — HYDROCHLOROTHIAZIDE 12.5 MG PO CAPS
12.5000 mg | ORAL_CAPSULE | Freq: Every day | ORAL | Status: DC
  Administered 2019-12-10 – 2019-12-11 (×2): 12.5 mg via ORAL
  Filled 2019-12-10 (×2): qty 1

## 2019-12-10 MED ORDER — SENNOSIDES-DOCUSATE SODIUM 8.6-50 MG PO TABS
1.0000 | ORAL_TABLET | Freq: Two times a day (BID) | ORAL | Status: DC
  Administered 2019-12-10: 21:00:00 1 via ORAL
  Filled 2019-12-10 (×2): qty 1

## 2019-12-10 MED ORDER — LISINOPRIL 10 MG PO TABS
10.0000 mg | ORAL_TABLET | Freq: Every day | ORAL | Status: DC
  Administered 2019-12-10 – 2019-12-11 (×2): 10 mg via ORAL
  Filled 2019-12-10 (×2): qty 1

## 2019-12-10 MED ORDER — POLYETHYLENE GLYCOL 3350 17 G PO PACK
17.0000 g | PACK | Freq: Every day | ORAL | Status: DC
  Administered 2019-12-10: 17 g via ORAL
  Filled 2019-12-10 (×2): qty 1

## 2019-12-10 NOTE — Progress Notes (Signed)
Pt blood pressure high. Notified MD. Awaiting orders.

## 2019-12-10 NOTE — Progress Notes (Signed)
Progress Note    Bryan Kelley  RKY:[id number] DOB: 1956-05-12  DOA: 12/06/2019 PCP: Patient, No Pcp Per      Brief Narrative:    Medical records reviewed and are as summarized below:  Bryan Kelley is an 64 y.o. male  with medical history significant of COPD, asthma, GERD, left eye poor vision, BPH, alcohol abuse, depression, who presents with shortness of breath.      Assessment/Plan:   Principal Problem:   Acute respiratory disease due to COVID-19 virus Active Problems:   GERD   BPH (benign prostatic hyperplasia)   Alcohol abuse   COPD exacerbation (HCC)   Acute on chronic respiratory failure with hypoxia (HCC)   Nausea vomiting and diarrhea   Acute on chronic respiratory failure with hypoxia due to acute respiratory disease due to COVID-19 virus and COPD exacerbation:  Continue bronchodilators, IV steroids and IV remdesivir. Continue oxygen via nasal cannula and taper off as able.  Hypertension: BP still elevated.  Patient has agreed to take antihypertensives.  Start HCTZ and Lisinopril   GERD: -ppi  BPH (benign prostatic hyperplasia): Flomax  Left eye conjunctivitis Treat with erythromycin ointment.  Alcohol abuse: -CiWA protocol  Nausea vomiting and diarrhea:  Improved. Antiemetics as needed.  Body mass index is 27.44 kg/m.   Family Communication/Anticipated D/C date and plan/Code Status   DVT prophylaxis: Lovenox Code Status: Full code Family Communication: Plan discussed with patient Disposition Plan: Patient is from home.  Possible discharge to home in 2 to 3 days when shortness of breath and hypoxemia improves.      Subjective:   No new complaints.  He still feels short of breath.  Wheezing is persistent and cough is frequent.  No discharge on the left arm but it feels a little sore.  Objective:    Vitals:   12/10/19 0252 12/10/19 0400 12/10/19 0700 12/10/19 0742  BP: (!) 164/73 (!) 172/100 (!) 163/84 (!) 184/93  Pulse: 77  70  74  Resp:  18  18  Temp:  97.8 F (36.6 C)  97.9 F (36.6 C)  TempSrc:  Oral  Oral  SpO2:  98%  97%  Weight:      Height:        Intake/Output Summary (Last 24 hours) at 12/10/2019 0920 Last data filed at 12/10/2019 0600 Gross per 24 hour  Intake 940 ml  Output 200 ml  Net 740 ml   Filed Weights   12/06/19 0944  Weight: 77.1 kg    Exam:  GEN: NAD SKIN: No rash EYES: No discharge or redness noted in the left eye. Left eye looks abnormal. ENT: MMM CV: RRR PULM: Reduced air entry on both sides.  Diffuse expiratory wheezing bilaterally. ABD: soft, ND, NT, +BS CNS: AAO x 3, non focal EXT: No edema or tenderness   Data Reviewed:   I have personally reviewed following labs and imaging studies:  Labs: Labs show the following:   Basic Metabolic Panel: Recent Labs  Lab 12/06/19 0951 12/07/19 0417 12/08/19 0314 12/09/19 0703 12/10/19 0441  NA 133*  --   --   --   --   K 3.7  --   --   --   --   CL 95*  --   --   --   --   CO2 28  --   --   --   --   GLUCOSE 129*  --   --   --   --  BUN 11  --   --   --   --   CREATININE 0.95  --   --   --   --   CALCIUM 9.0  --   --   --   --   MG  --  2.1 1.9 2.1 2.0   GFR Estimated Creatinine Clearance: 78.8 mL/min (by C-G formula based on SCr of 0.95 mg/dL). Liver Function Tests: Recent Labs  Lab 12/06/19 1724  AST 43*  ALT 45*  ALKPHOS 77  BILITOT 1.0  PROT 7.1  ALBUMIN 3.7   No results for input(s): LIPASE, AMYLASE in the last 168 hours. No results for input(s): AMMONIA in the last 168 hours. Coagulation profile No results for input(s): INR, PROTIME in the last 168 hours.  CBC: Recent Labs  Lab 12/06/19 0951 12/07/19 0417 12/08/19 0314 12/09/19 0703 12/10/19 0441  WBC 7.9 8.2 11.7* 13.5* 14.5*  NEUTROABS 4.9 6.3 10.0* 11.6* 12.8*  HGB 15.3 14.1 13.6 14.0 14.0  HCT 45.1 42.1 41.3 42.3 41.2  MCV 87.9 88.8 90.6 89.8 88.4  PLT 243 216 215 214 232   Cardiac Enzymes: No results for input(s): CKTOTAL,  CKMB, CKMBINDEX, TROPONINI in the last 168 hours. BNP (last 3 results) No results for input(s): PROBNP in the last 8760 hours. CBG: No results for input(s): GLUCAP in the last 168 hours. D-Dimer: No results for input(s): DDIMER in the last 72 hours. Hgb A1c: No results for input(s): HGBA1C in the last 72 hours. Lipid Profile: No results for input(s): CHOL, HDL, LDLCALC, TRIG, CHOLHDL, LDLDIRECT in the last 72 hours. Thyroid function studies: No results for input(s): TSH, T4TOTAL, T3FREE, THYROIDAB in the last 72 hours.  Invalid input(s): FREET3 Anemia work up: Recent Labs    12/09/19 0703 12/10/19 0441  FERRITIN 79 69   Sepsis Labs: Recent Labs  Lab 12/06/19 0951 12/06/19 1724 12/07/19 0417 12/08/19 0314 12/09/19 0703 12/10/19 0441  PROCALCITON  --  <0.10  --   --   --   --   WBC   < >  --  8.2 11.7* 13.5* 14.5*   < > = values in this interval not displayed.    Microbiology Recent Results (from the past 240 hour(s))  Respiratory Panel by RT PCR (Flu A&B, Covid) - Nasopharyngeal Swab     Status: Abnormal   Collection Time: 12/06/19 12:01 PM   Specimen: Nasopharyngeal Swab  Result Value Ref Range Status   SARS Coronavirus 2 by RT PCR POSITIVE (A) NEGATIVE Final    Comment: RESULT CALLED TO, READ BACK BY AND VERIFIED WITH: MITCH BARKER 12/06/19 1427 KLW (NOTE) SARS-CoV-2 target nucleic acids are DETECTED. SARS-CoV-2 RNA is generally detectable in upper respiratory specimens  during the acute phase of infection. Positive results are indicative of the presence of the identified virus, but do not rule out bacterial infection or co-infection with other pathogens not detected by the test. Clinical correlation with patient history and other diagnostic information is necessary to determine patient infection status. The expected result is Negative. Fact Sheet for Patients:  https://www.moore.com/ Fact Sheet for Healthcare  Providers: https://www.young.biz/ This test is not yet approved or cleared by the [place] FDA and  has been authorized for detection and/or diagnosis of SARS-CoV-2 by FDA under an Emergency Use Authorization (EUA).  This EUA will remain in effect (meaning this test can be used) for the  duration of  the COVID-19 declaration under Section 564(b)(1) of the Act, 21 U.S.C. section 360bbb-3(b)(1), unless the  authorization is terminated or revoked sooner.    Influenza A by PCR NEGATIVE NEGATIVE Final   Influenza B by PCR NEGATIVE NEGATIVE Final    Comment: (NOTE) The Xpert Xpress SARS-CoV-2/FLU/RSV assay is intended as an aid in  the diagnosis of influenza from Nasopharyngeal swab specimens and  should not be used as a sole basis for treatment. Nasal washings and  aspirates are unacceptable for Xpert Xpress SARS-CoV-2/FLU/RSV  testing. Fact Sheet for Patients: https://www.moore.com/ Fact Sheet for Healthcare Providers: https://www.young.biz/ This test is not yet approved or cleared by the [place] FDA and  has been authorized for detection and/or diagnosis of SARS-CoV-2 by  FDA under an Emergency Use Authorization (EUA). This EUA will remain  in effect (meaning this test can be used) for the duration of the  Covid-19 declaration under Section 564(b)(1) of the Act, 21  U.S.C. section 360bbb-3(b)(1), unless the authorization is  terminated or revoked. Performed at [facility], [address] [place], [place] [address]     Procedures and diagnostic studies:  No results found.  Medications:   . vitamin C  500 mg Oral Daily  . azithromycin  500 mg Oral Daily  . brimonidine  1 drop Left Eye Daily  . brinzolamide  1 drop Left Eye Daily  . dextromethorphan-guaiFENesin  1 tablet Oral BID  . enoxaparin (LOVENOX) injection  40 mg Subcutaneous Q24H  . erythromycin   Left Eye Q8H  . folic acid  1 mg Oral Daily   . hydrochlorothiazide  12.5 mg Oral Daily  . ipratropium  2 puff Inhalation Q4H  . lisinopril  10 mg Oral Daily  . LORazepam  0-4 mg Intravenous Q12H  . methylPREDNISolone (SOLU-MEDROL) injection  60 mg Intravenous Q8H  . mometasone-formoterol  2 puff Inhalation BID  . multivitamin with minerals  1 tablet Oral Daily  . pantoprazole  40 mg Oral Daily  . tamsulosin  0.4 mg Oral Q breakfast  . thiamine  100 mg Oral Daily   Or  . thiamine  100 mg Intravenous Daily  . zinc sulfate  220 mg Oral Daily   Continuous Infusions: . remdesivir 100 mg in NS 100 mL Stopped (12/09/19 1033)     LOS: 4 days   Tanyla Stege  [facility]     12/10/2019, 9:20 AM

## 2019-12-10 NOTE — Progress Notes (Signed)
Pt blood pressure high. Md notified

## 2019-12-11 MED ORDER — HYDROCHLOROTHIAZIDE 12.5 MG PO CAPS: 12.5000 mg | ORAL_CAPSULE | Freq: Every day | ORAL | 0 refills | Status: DC

## 2019-12-11 MED ORDER — LISINOPRIL 10 MG PO TABS: 10.0000 mg | ORAL_TABLET | Freq: Every day | ORAL | 0 refills | Status: DC

## 2019-12-11 MED ORDER — PREDNISONE 20 MG PO TABS: 40.0000 mg | ORAL_TABLET | Freq: Every day | ORAL | 0 refills | Status: AC

## 2019-12-11 NOTE — Plan of Care (Signed)
  Problem: Education: Goal: Knowledge of risk factors and measures for prevention of condition will improve Outcome: Completed/Met   Problem: Coping: Goal: Psychosocial and spiritual needs will be supported Outcome: Completed/Met   Problem: Respiratory: Goal: Will maintain a patent airway Outcome: Completed/Met Goal: Complications related to the disease process, condition or treatment will be avoided or minimized Outcome: Completed/Met   Problem: Education: Goal: Knowledge of General Education information will improve Description: Including pain rating scale, medication(s)/side effects and non-pharmacologic comfort measures Outcome: Completed/Met   Problem: Health Behavior/Discharge Planning: Goal: Ability to manage health-related needs will improve Outcome: Completed/Met   Problem: Clinical Measurements: Goal: Ability to maintain clinical measurements within normal limits will improve Outcome: Completed/Met Goal: Will remain free from infection Outcome: Completed/Met Goal: Diagnostic test results will improve Outcome: Completed/Met Goal: Respiratory complications will improve Outcome: Completed/Met Goal: Cardiovascular complication will be avoided Outcome: Completed/Met   Problem: Activity: Goal: Risk for activity intolerance will decrease Outcome: Completed/Met   Problem: Nutrition: Goal: Adequate nutrition will be maintained Outcome: Completed/Met   Problem: Coping: Goal: Level of anxiety will decrease Outcome: Completed/Met   Problem: Elimination: Goal: Will not experience complications related to bowel motility Outcome: Completed/Met Goal: Will not experience complications related to urinary retention Outcome: Completed/Met   Problem: Pain Managment: Goal: General experience of comfort will improve Outcome: Completed/Met   Problem: Safety: Goal: Ability to remain free from injury will improve Outcome: Completed/Met   Problem: Skin Integrity: Goal:  Risk for impaired skin integrity will decrease Outcome: Completed/Met   Problem: Clinical Measurements: Goal: Ability to maintain clinical measurements within normal limits will improve Outcome: Completed/Met Goal: Will remain free from infection Outcome: Completed/Met Goal: Diagnostic test results will improve Outcome: Completed/Met Goal: Respiratory complications will improve Outcome: Completed/Met Goal: Cardiovascular complication will be avoided Outcome: Completed/Met   Problem: Activity: Goal: Risk for activity intolerance will decrease Outcome: Completed/Met   Problem: Coping: Goal: Level of anxiety will decrease Outcome: Completed/Met   Problem: Elimination: Goal: Will not experience complications related to bowel motility Outcome: Completed/Met Goal: Will not experience complications related to urinary retention Outcome: Completed/Met   Problem: Safety: Goal: Ability to remain free from injury will improve Outcome: Completed/Met   Problem: Skin Integrity: Goal: Risk for impaired skin integrity will decrease Outcome: Completed/Met

## 2019-12-11 NOTE — Discharge Summary (Signed)
Physician Discharge Summary  Bryan Kelley FXT:[id number] DOB: 02/19/1956 DOA: 12/06/2019  PCP: Patient, No Pcp Per  Admit date: 12/06/2019 Discharge date: 12/11/2019  Discharge disposition: Home   Recommendations for Outpatient Follow-Up:   Follow-up with PCP in 1 to 2 weeks   Discharge Diagnosis:   Principal Problem:   Acute respiratory disease due to COVID-19 virus Active Problems:   GERD   BPH (benign prostatic hyperplasia)   Alcohol abuse   COPD exacerbation ([place])   Acute on chronic respiratory failure with hypoxia ([facility])   Nausea vomiting and diarrhea    Discharge Condition: Stable.  Diet recommendation: Low-salt diet  Code status: Full code.    Hospital Course:   Bryan Kelley is an 64 y.o. male with medical history significant ofCOPD, asthma, GERD, glucoma, left eye poor vision, BPH, alcohol abuse, depression, who presented to the hospital with cough, wheezing and shortness of breath.  He tested positive for coronavirus but chest x-ray did not show any acute abnormality.  He was admitted to the hospital for COPD exacerbation, COVID-19 infection and acute hypoxemic respiratory failure.  He was treated with IV steroids, IV remdesivir, azithromycin and oxygen.  His symptoms slowly improved.  He completed IV remdesivir and he was discharged on prednisone.  He was tolerating room air prior to discharge. He had uncontrolled hypertension on admission so he was started on hydrochlorothiazide and lisinopril.  He also complained of discharge, crusting and soreness in the left eye so he was given erythromycin ointment for left eye conjunctivitis.  He is deemed stable for discharge to home today.  He has been advised to establish care with a primary care physician for routine health maintenance.  Follow-up with an ophthalmologist was also recommended.     Discharge Exam:   Vitals:   12/11/19 0018 12/11/19 0700  BP: (!) 157/79 (!) 154/97  Pulse:  87  Resp: 15 19    Temp: 97.9 F (36.6 C) 98.2 F (36.8 C)  SpO2: 94% 94%   Vitals:   12/10/19 1350 12/10/19 1533 12/11/19 0018 12/11/19 0700  BP: (!) 179/82 (!) 180/105 (!) 157/79 (!) 154/97  Pulse:  78  87  Resp:  18 15 19   Temp:  98.2 F (36.8 C) 97.9 F (36.6 C) 98.2 F (36.8 C)  TempSrc:  Oral Oral Oral  SpO2:  97% 94% 94%  Weight:      Height:         GEN: NAD SKIN: No rash EYES: No redness, discharge or crusting in the left eye but left eye looks abnormal ENT: MMM CV: RRR PULM: CTA B ABD: soft, ND, NT, +BS CNS: AAO x 3, non focal EXT: No edema or tenderness   The results of significant diagnostics from this hospitalization (including imaging, microbiology, ancillary and laboratory) are listed below for reference.     Procedures and Diagnostic Studies:   DG Chest 2 View  Result Date: 12/06/2019 CLINICAL DATA:  Increasing shortness of breath. EXAM: CHEST - 2 VIEW COMPARISON:  Chest x-ray 11/29/2019 and chest CT 11/30/2019 FINDINGS: The heart is normal in size. Stable tortuosity and calcification of the thoracic aorta. Stable emphysematous changes and mild pulmonary scarring but no acute overlying pulmonary findings. The bony thorax is intact. IMPRESSION: Emphysematous changes and pulmonary scarring but no acute overlying pulmonary process. Electronically Signed   By: Marijo Sanes M.D.   On: 12/06/2019 10:14     Labs:   Basic Metabolic Panel: Recent Labs  Lab 12/06/19  [id number] 12/07/19 0417 12/08/19 0314 12/09/19 0703 12/10/19 0441  NA 133*  --   --   --   --   K 3.7  --   --   --   --   CL 95*  --   --   --   --   CO2 28  --   --   --   --   GLUCOSE 129*  --   --   --   --   BUN 11  --   --   --   --   CREATININE 0.95  --   --   --   --   CALCIUM 9.0  --   --   --   --   MG  --  2.1 1.9 2.1 2.0   GFR Estimated Creatinine Clearance: 78.8 mL/min (by C-G formula based on SCr of 0.95 mg/dL). Liver Function Tests: Recent Labs  Lab 12/06/19 1724  AST 43*  ALT 45*   ALKPHOS 77  BILITOT 1.0  PROT 7.1  ALBUMIN 3.7   No results for input(s): LIPASE, AMYLASE in the last 168 hours. No results for input(s): AMMONIA in the last 168 hours. Coagulation profile No results for input(s): INR, PROTIME in the last 168 hours.  CBC: Recent Labs  Lab 12/06/19 0951 12/07/19 0417 12/08/19 0314 12/09/19 0703 12/10/19 0441  WBC 7.9 8.2 11.7* 13.5* 14.5*  NEUTROABS 4.9 6.3 10.0* 11.6* 12.8*  HGB 15.3 14.1 13.6 14.0 14.0  HCT 45.1 42.1 41.3 42.3 41.2  MCV 87.9 88.8 90.6 89.8 88.4  PLT 243 216 215 214 232   Cardiac Enzymes: No results for input(s): CKTOTAL, CKMB, CKMBINDEX, TROPONINI in the last 168 hours. BNP: Invalid input(s): POCBNP CBG: No results for input(s): GLUCAP in the last 168 hours. D-Dimer No results for input(s): DDIMER in the last 72 hours. Hgb A1c No results for input(s): HGBA1C in the last 72 hours. Lipid Profile No results for input(s): CHOL, HDL, LDLCALC, TRIG, CHOLHDL, LDLDIRECT in the last 72 hours. Thyroid function studies No results for input(s): TSH, T4TOTAL, T3FREE, THYROIDAB in the last 72 hours.  Invalid input(s): FREET3 Anemia work up Recent Labs    12/09/19 0703 12/10/19 0441  FERRITIN 79 69   Microbiology Recent Results (from the past 240 hour(s))  Respiratory Panel by RT PCR (Flu A&B, Covid) - Nasopharyngeal Swab     Status: Abnormal   Collection Time: 12/06/19 12:01 PM   Specimen: Nasopharyngeal Swab  Result Value Ref Range Status   SARS Coronavirus 2 by RT PCR POSITIVE (A) NEGATIVE Final    Comment: RESULT CALLED TO, READ BACK BY AND VERIFIED WITH: MITCH BARKER 12/06/19 1427 KLW (NOTE) SARS-CoV-2 target nucleic acids are DETECTED. SARS-CoV-2 RNA is generally detectable in upper respiratory specimens  during the acute phase of infection. Positive results are indicative of the presence of the identified virus, but do not rule out bacterial infection or co-infection with other pathogens not detected by the  test. Clinical correlation with patient history and other diagnostic information is necessary to determine patient infection status. The expected result is Negative. Fact Sheet for Patients:  https://www.moore.com/ Fact Sheet for Healthcare Providers: https://www.young.biz/ This test is not yet approved or cleared by the [place] FDA and  has been authorized for detection and/or diagnosis of SARS-CoV-2 by FDA under an Emergency Use Authorization (EUA).  This EUA will remain in effect (meaning this test can be used) for the  duration of  the COVID-19 declaration  under Section 564(b)(1) of the Act, 21 U.S.C. section 360bbb-3(b)(1), unless the authorization is terminated or revoked sooner.    Influenza A by PCR NEGATIVE NEGATIVE Final   Influenza B by PCR NEGATIVE NEGATIVE Final    Comment: (NOTE) The Xpert Xpress SARS-CoV-2/FLU/RSV assay is intended as an aid in  the diagnosis of influenza from Nasopharyngeal swab specimens and  should not be used as a sole basis for treatment. Nasal washings and  aspirates are unacceptable for Xpert Xpress SARS-CoV-2/FLU/RSV  testing. Fact Sheet for Patients: https://www.moore.com/ Fact Sheet for Healthcare Providers: https://www.young.biz/ This test is not yet approved or cleared by the [place] FDA and  has been authorized for detection and/or diagnosis of SARS-CoV-2 by  FDA under an Emergency Use Authorization (EUA). This EUA will remain  in effect (meaning this test can be used) for the duration of the  Covid-19 declaration under Section 564(b)(1) of the Act, 21  U.S.C. section 360bbb-3(b)(1), unless the authorization is  terminated or revoked. Performed at [facility], [address], [place], [place] [address]      Discharge Instructions:   Discharge Instructions    Diet - low sodium heart healthy   Complete by: As directed    Discharge  instructions   Complete by: As directed    Stay on isolation until 12/16/2019   Person Under Monitoring Name: AHMED INNISS  Location: [address] [place] [address]   Infection Prevention Recommendations for Individuals Confirmed to have, or Being Evaluated for, 2019 Novel Coronavirus (COVID-19) Infection Who Receive Care at Home  Individuals who are confirmed to have, or are being evaluated for, COVID-19 should follow the prevention steps below until a healthcare provider or local or state health department says they can return to normal activities.  Stay home except to get medical care You should restrict activities outside your home, except for getting medical care. Do not go to work, school, or public areas, and do not use public transportation or taxis.  Call ahead before visiting your doctor Before your medical appointment, call the healthcare provider and tell them that you have, or are being evaluated for, COVID-19 infection. This will help the healthcare provider's office take steps to keep other people from getting infected. Ask your healthcare provider to call the local or state health department.  Monitor your symptoms Seek prompt medical attention if your illness is worsening (e.g., difficulty breathing). Before going to your medical appointment, call the healthcare provider and tell them that you have, or are being evaluated for, COVID-19 infection. Ask your healthcare provider to call the local or state health department.  Wear a facemask You should wear a facemask that covers your nose and mouth when you are in the same room with other people and when you visit a healthcare provider. People who live with or visit you should also wear a facemask while they are in the same room with you.  Separate yourself from other people in your home As much as possible, you should stay in a different room from other people in your home. Also, you should use a  separate bathroom, if available.  Avoid sharing household items You should not share dishes, drinking glasses, cups, eating utensils, towels, bedding, or other items with other people in your home. After using these items, you should wash them thoroughly with soap and water.  Cover your coughs and sneezes Cover your mouth and nose with a tissue when you cough or sneeze, or you  can cough or sneeze into your sleeve. Throw used tissues in a lined trash can, and immediately wash your hands with soap and water for at least 20 seconds or use an alcohol-based hand rub.  Wash your [employer] your hands often and thoroughly with soap and water for at least 20 seconds. You can use an alcohol-based hand sanitizer if soap and water are not available and if your hands are not visibly dirty. Avoid touching your eyes, nose, and mouth with unwashed hands.   Prevention Steps for Caregivers and Household Members of Individuals Confirmed to have, or Being Evaluated for, COVID-19 Infection Being Cared for in the Home  If you live with, or provide care at home for, a person confirmed to have, or being evaluated for, COVID-19 infection please follow these guidelines to prevent infection:  Follow healthcare provider's instructions Make sure that you understand and can help the patient follow any healthcare provider instructions for all care.  Provide for the patient's basic needs You should help the patient with basic needs in the home and provide support for getting groceries, prescriptions, and other personal needs.  Monitor the patient's symptoms If they are getting sicker, call his or her medical provider and tell them that the patient has, or is being evaluated for, COVID-19 infection. This will help the healthcare provider's office take steps to keep other people from getting infected. Ask the healthcare provider to call the local or state health department.  Limit the number of people who have  contact with the patient If possible, have only one caregiver for the patient. Other household members should stay in another home or place of residence. If this is not possible, they should stay in another room, or be separated from the patient as much as possible. Use a separate bathroom, if available. Restrict visitors who do not have an essential need to be in the home.  Keep older adults, very young children, and other sick people away from the patient Keep older adults, very young children, and those who have compromised immune systems or chronic health conditions away from the patient. This includes people with chronic heart, lung, or kidney conditions, diabetes, and cancer.  Ensure good ventilation Make sure that shared spaces in the home have good air flow, such as from an air conditioner or an opened window, weather permitting.  Wash your hands often Wash your hands often and thoroughly with soap and water for at least 20 seconds. You can use an alcohol based hand sanitizer if soap and water are not available and if your hands are not visibly dirty. Avoid touching your eyes, nose, and mouth with unwashed hands. Use disposable paper towels to dry your hands. If not available, use dedicated cloth towels and replace them when they become wet.  Wear a facemask and gloves Wear a disposable facemask at all times in the room and gloves when you touch or have contact with the patient's blood, body fluids, and/or secretions or excretions, such as sweat, saliva, sputum, nasal mucus, vomit, urine, or feces.  Ensure the mask fits over your nose and mouth tightly, and do not touch it during use. Throw out disposable facemasks and gloves after using them. Do not reuse. Wash your hands immediately after removing your facemask and gloves. If your personal clothing becomes contaminated, carefully remove clothing and launder. Wash your hands after handling contaminated clothing. Place all used  disposable facemasks, gloves, and other waste in a lined container before disposing them with other  household waste. Remove gloves and wash your hands immediately after handling these items.  Do not share dishes, glasses, or other household items with the patient Avoid sharing household items. You should not share dishes, drinking glasses, cups, eating utensils, towels, bedding, or other items with a patient who is confirmed to have, or being evaluated for, COVID-19 infection. After the person uses these items, you should wash them thoroughly with soap and water.  Wash laundry thoroughly Immediately remove and wash clothes or bedding that have blood, body fluids, and/or secretions or excretions, such as sweat, saliva, sputum, nasal mucus, vomit, urine, or feces, on them. Wear gloves when handling laundry from the patient. Read and follow directions on labels of laundry or clothing items and detergent. In general, wash and dry with the warmest temperatures recommended on the label.  Clean all areas the individual has used often Clean all touchable surfaces, such as counters, tabletops, doorknobs, bathroom fixtures, toilets, phones, keyboards, tablets, and bedside tables, every day. Also, clean any surfaces that may have blood, body fluids, and/or secretions or excretions on them. Wear gloves when cleaning surfaces the patient has come in contact with. Use a diluted bleach solution (e.g., dilute bleach with 1 part bleach and 10 parts water) or a household disinfectant with a label that says EPA-registered for coronaviruses. To make a bleach solution at home, add 1 tablespoon of bleach to 1 quart (4 cups) of water. For a larger supply, add  cup of bleach to 1 gallon (16 cups) of water. Read labels of cleaning products and follow recommendations provided on product labels. Labels contain instructions for safe and effective use of the cleaning product including precautions you should take when applying  the product, such as wearing gloves or eye protection and making sure you have good ventilation during use of the product. Remove gloves and wash hands immediately after cleaning.  Monitor yourself for signs and symptoms of illness Caregivers and household members are considered close contacts, should monitor their health, and will be asked to limit movement outside of the home to the extent possible. Follow the monitoring steps for close contacts listed on the symptom monitoring form.   ? If you have additional questions, contact your local health department or call the epidemiologist on call at [phone] (available 24/7). ? This guidance is subject to change. For the most up-to-date guidance from [facility], please refer to their website: TripMetro.huhttps://www.cdc.gov/coronavirus/2019-ncov/hcp/guidance-prevent-spread.html   Increase activity slowly   Complete by: As directed      Allergies as of 12/11/2019   No Known Allergies     Medication List    TAKE these medications   albuterol 108 (90 Base) MCG/ACT inhaler Commonly known as: VENTOLIN HFA Inhale 2 puffs into the lungs every 6 (six) hours as needed for wheezing or shortness of breath.   Artificial Tears 0.1-0.3 % Soln Generic drug: Dextran 70-Hypromellose Apply 1 drop to eye 4 (four) times daily as needed. In both eyes for dry eyes.   Brinzolamide-Brimonidine 1-0.2 % Susp Apply 1 drop to eye daily. Into left eye.   guaiFENesin-dextromethorphan 100-10 MG/5ML syrup Commonly known as: ROBITUSSIN DM Take 5 mLs by mouth every 6 (six) hours as needed for cough.   hydrochlorothiazide 12.5 MG capsule Commonly known as: MICROZIDE Take 1 capsule (12.5 mg total) by mouth daily. Start taking on: December 12, 2019   ipratropium 17 MCG/ACT inhaler Commonly known as: ATROVENT HFA Inhale 2 puffs into the lungs 3 (three) times daily.   ipratropium-albuterol 0.5-2.5 (3)  MG/3ML Soln Commonly known as: DUONEB Take 3 mLs by nebulization every 6  (six) hours as needed.   lisinopril 10 MG tablet Commonly known as: ZESTRIL Take 1 tablet (10 mg total) by mouth daily. Start taking on: December 12, 2019   omeprazole 20 MG capsule Commonly known as: PRILOSEC Take 1 capsule (20 mg total) by mouth daily.   predniSONE 20 MG tablet Commonly known as: DELTASONE Take 2 tablets (40 mg total) by mouth daily for 3 days.   Symbicort 160-4.5 MCG/ACT inhaler Generic drug: budesonide-formoterol Inhale 2 puffs into the lungs 2 (two) times daily.   tamsulosin 0.4 MG Caps capsule Commonly known as: FLOMAX Take 0.4 mg by mouth daily with breakfast. Take 30 minutes after breakfast.         Time coordinating discharge: 27 minutes  Signed:  Tineshia Becraft  [facility] 12/11/2019, 10:21 AM

## 2019-12-20 ENCOUNTER — Other Ambulatory Visit: Payer: Self-pay

## 2019-12-26 ENCOUNTER — Other Ambulatory Visit: Payer: Self-pay

## 2019-12-26 ENCOUNTER — Encounter: Payer: Self-pay | Admitting: Emergency Medicine

## 2019-12-26 ENCOUNTER — Emergency Department
Admission: EM | Admit: 2019-12-26 | Discharge: 2019-12-26 | Disposition: A | Discharge status: Home / Self Care | Payer: Medicaid Other | Attending: Emergency Medicine | Admitting: Emergency Medicine

## 2019-12-26 DIAGNOSIS — Z202 Contact with and (suspected) exposure to infections with a predominantly sexual mode of transmission: Secondary | ICD-10-CM | POA: Insufficient documentation

## 2019-12-26 DIAGNOSIS — Z87891 Personal history of nicotine dependence: Secondary | ICD-10-CM | POA: Diagnosis not present

## 2019-12-26 DIAGNOSIS — K146 Glossodynia: Secondary | ICD-10-CM | POA: Insufficient documentation

## 2019-12-26 DIAGNOSIS — Z79899 Other long term (current) drug therapy: Secondary | ICD-10-CM | POA: Insufficient documentation

## 2019-12-26 DIAGNOSIS — Z8616 Personal history of COVID-19: Secondary | ICD-10-CM | POA: Diagnosis not present

## 2019-12-26 DIAGNOSIS — J45909 Unspecified asthma, uncomplicated: Secondary | ICD-10-CM | POA: Diagnosis not present

## 2019-12-26 DIAGNOSIS — J449 Chronic obstructive pulmonary disease, unspecified: Secondary | ICD-10-CM | POA: Diagnosis not present

## 2019-12-26 LAB — CHLAMYDIA/NGC RT PCR (ARMC ONLY)
Chlamydia Tr: NOT DETECTED
N gonorrhoeae: NOT DETECTED

## 2019-12-26 MED ORDER — LIDOCAINE VISCOUS HCL 2 % MT SOLN: 5.0000 mL | Freq: Four times a day (QID) | OROMUCOSAL | 0 refills | Status: DC | PRN

## 2019-12-26 NOTE — ED Provider Notes (Signed)
[facility] Emergency Department Provider Note   ____________________________________________   First MD Initiated Contact with Patient 12/26/19 1116     (approximate)  I have reviewed the triage vital signs and the nursing notes.   HISTORY  Chief Complaint Oral Swelling    HPI Bryan Kelley is a 64 y.o. male patient complain of a burning sensation and tongue edema secondary to a sexual encounter approximately 20 days ago.  Patient states only oral sex during the encounter.  Patient states he was given Decadron oral solution for edema which has helped.  Patient has not been evaluated for sexual transmitted disease.         Past Medical History:  Diagnosis Date  . Asthma   . COPD (chronic obstructive pulmonary disease) ([place])   . Depression   . GERD (gastroesophageal reflux disease)   . Vision loss, left eye     Patient Active Problem List   Diagnosis Date Noted  . Acute on chronic respiratory failure with hypoxia ([place]) 12/06/2019  . Nausea vomiting and diarrhea 12/06/2019  . Acute respiratory disease due to COVID-19 virus 12/06/2019  . COPD (chronic obstructive pulmonary disease) ([place]) 10/30/2019  . Hyperglycemia   . COPD with acute exacerbation ([place]) 10/29/2019  . COPD exacerbation ([place]) 10/29/2019  . Epigastric abdominal pain 08/12/2012  . Alcohol abuse 03/14/2012  . Elevated BP 12/07/2011  . ED (erectile dysfunction) 07/28/2011  . NICOTINE ADDICTION 10/29/2009  . ACUTE BRONCHITIS 05/07/2009  . 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 ANKLE PAIN, RIGHT 10/04/2008  . FATIGUE 10/04/2008  . ANXIETY 02/17/2008  . ASTHMA 02/17/2008  . GERD 02/17/2008  . BPH (benign prostatic hyperplasia) 02/17/2008    Past Surgical History:  Procedure Laterality Date  . EYE SURGERY  2009, about 8841,6606   steel in left eye on the job, legally blind     Prior to Admission medications   Medication Sig Start Date End Date Taking? Authorizing  Provider  albuterol (VENTOLIN HFA) 108 (90 Base) MCG/ACT inhaler Inhale 2 puffs into the lungs every 6 (six) hours as needed for wheezing or shortness of breath. 11/30/19   Alfred Levins, [place], MD  Brinzolamide-Brimonidine 1-0.2 % SUSP Apply 1 drop to eye daily. Into left eye. 11/01/19   Jennye Boroughs, MD  budesonide-formoterol [facility]) 160-4.5 MCG/ACT inhaler Inhale 2 puffs into the lungs 2 (two) times daily.    [provider]  Dextran 70-Hypromellose (ARTIFICIAL TEARS) 0.1-0.3 % SOLN Apply 1 drop to eye 4 (four) times daily as needed. In both eyes for dry eyes. 01/26/18   [provider]  hydrochlorothiazide (MICROZIDE) 12.5 MG capsule Take 1 capsule (12.5 mg total) by mouth daily. 12/12/19 01/11/20  Jennye Boroughs, MD  ipratropium (ATROVENT HFA) 17 MCG/ACT inhaler Inhale 2 puffs into the lungs 3 (three) times daily.    [provider]  ipratropium-albuterol (DUONEB) 0.5-2.5 (3) MG/3ML SOLN Take 3 mLs by nebulization every 6 (six) hours as needed. 11/01/19   Jennye Boroughs, MD  lidocaine (XYLOCAINE) 2 % solution Use as directed 5 mLs in the mouth or throat every 6 (six) hours as needed for mouth pain. For oral swish and swallow. 12/26/19   Sable Feil, PA-C  lisinopril (ZESTRIL) 10 MG tablet Take 1 tablet (10 mg total) by mouth daily. 12/12/19 01/11/20  Jennye Boroughs, MD  omeprazole (PRILOSEC) 20 MG capsule Take 1 capsule (20 mg total) by mouth daily. 08/12/12   Alycia Rossetti, MD  tamsulosin (FLOMAX) 0.4 MG CAPS capsule Take  0.4 mg by mouth daily with breakfast. Take 30 minutes after breakfast.    [provider]    Allergies Patient has no known allergies.  Family History  Problem Relation Age of Onset  . Diabetes Mother   . Hypertension Mother   . Stroke Mother   . Asthma Father   . Diabetes Father   . Diabetes Sister   . Hypertension Sister   . Diabetes Brother   . Hypertension Brother     Social History Social History   Tobacco Use  .  Smoking status: Former Smoker    Packs/day: 0.50  . Smokeless tobacco: Never Used  Substance Use Topics  . Alcohol use: Yes    Comment: vodka on the weekends  . Drug use: No    Review of Systems  Constitutional: No fever/chills Eyes: No visual changes. ENT: Sore throat. Cardiovascular: Denies chest pain. Respiratory: Denies shortness of breath. Gastrointestinal: No abdominal pain.  No nausea, no vomiting.  No diarrhea.  No constipation. Genitourinary: Negative for dysuria. Musculoskeletal: Negative for back pain. Skin: Negative for rash. Neurological: Negative for headaches, focal weakness or numbness. Psychiatric:  Anxiety. ____________________________________________   PHYSICAL EXAM:  VITAL SIGNS: ED Triage Vitals  Enc Vitals Group     BP 12/26/19 1103 118/82     Pulse Rate 12/26/19 1103 96     Resp 12/26/19 1103 18     Temp 12/26/19 1103 97.7 F (36.5 C)     Temp Source 12/26/19 1103 Oral     SpO2 12/26/19 1103 96 %     Weight 12/26/19 1101 170 lb (77.1 kg)     Height 12/26/19 1101 5\' 6"  (1.676 m)     Head Circumference --      Peak Flow --      Pain Score 12/26/19 1101 9     Pain Loc --      Pain Edu? --      Excl. in GC? --    Constitutional: Alert and oriented. Well appearing and in no acute distress. Eyes: Conjunctivae are normal. PERRL. EOMI. Head: Atraumatic. Nose: No congestion/rhinnorhea. Mouth/Throat: Mucous membranes are moist.  Oropharynx non-erythematous.  Tongue edema. Neck: No stridor.   Hematological/Lymphatic/Immunilogical: No cervical lymphadenopathy. Cardiovascular: Normal rate, regular rhythm. Grossly normal heart sounds.  Good peripheral circulation. Respiratory: Normal respiratory effort.  No retractions. Lungs CTAB. Skin:  Skin is warm, dry and intact. No rash noted. Psychiatric: Mood and affect are normal. Speech and behavior are normal.  ____________________________________________   LABS (all labs ordered are listed, but only  abnormal results are displayed)  Labs Reviewed  CHLAMYDIA/NGC RT PCR ([facility] ONLY)   ____________________________________________  EKG   ____________________________________________  RADIOLOGY  ED MD interpretation:    Official radiology report(s): No results found.  ____________________________________________   PROCEDURES  Procedure(s) performed (including Critical Care):  Procedures   ____________________________________________   INITIAL IMPRESSION / ASSESSMENT AND PLAN / ED COURSE  As part of my medical decision making, I reviewed the following data within the electronic MEDICAL RECORD NUMBER     Patient presents with tongue edema and pain.  Patient was concerned secondary to oral surgeon, infection 20 days ago.  Patient states the edema is resolving with steroids.  Patient request medication for burning sensation STD testing.  Patient given discharge care instruction advised continue previous medication.  Patient prescribed viscous lidocaine and advised he will be notified telephonically if test is positive.          ____________________________________________  FINAL CLINICAL IMPRESSION(S) / ED DIAGNOSES  Final diagnoses:  Possible exposure to STD     ED Discharge Orders         Ordered    lidocaine (XYLOCAINE) 2 % solution  Every 6 hours PRN     12/26/19 1141           Note:  This document was prepared using Dragon voice recognition software and may include unintentional dictation errors.    Joni Reining, PA-C 12/26/19 1142    Sharyn Creamer, MD 12/28/19 [phone]

## 2019-12-26 NOTE — Discharge Instructions (Addendum)
Continue previous medications and start viscous lidocaine as directed.  You will be notified telephonically if STD test is positive.

## 2019-12-26 NOTE — ED Triage Notes (Signed)
Oral sexual with a male partner over 20 days ago , has had tongue burning sensation/ pain, and tongue swelling.  No other complaints noted.

## 2019-12-26 NOTE — ED Notes (Signed)
States his phone is turned off at presents  Please call sister's number

## 2019-12-26 NOTE — ED Notes (Signed)
See triage note .Marland Kitchen Presents with  Pain and burning sensation to tongues for the past 3 weeks

## 2019-12-27 ENCOUNTER — Telehealth: Payer: Self-pay | Admitting: Emergency Medicine

## 2019-12-27 NOTE — Telephone Encounter (Signed)
Patient called and asked for results of test.  Gave him results of gonorrhea/chlamydia test negative.  He says his tongue is burning every time he eats.  I told him to use the lidocaine, do salt water rinses and eat only bland foods--no spicy or acidic foods.  I told him to go to pcp if it isnt improved in a few days.  He then says he has had this for a month and had already been seen at [facility] and was given mouthwash--which he is out of.  I asked him to call them now and tell them the mouthwash did not work.

## 2020-05-07 ENCOUNTER — Other Ambulatory Visit: Payer: Self-pay

## 2020-05-07 ENCOUNTER — Encounter (HOSPITAL_COMMUNITY): Payer: Self-pay | Admitting: Emergency Medicine

## 2020-05-07 ENCOUNTER — Emergency Department (HOSPITAL_COMMUNITY): Payer: Medicaid Other

## 2020-05-07 ENCOUNTER — Emergency Department (HOSPITAL_COMMUNITY)
Admission: EM | Admit: 2020-05-07 | Discharge: 2020-05-07 | Disposition: A | Discharge status: Home / Self Care | Payer: Medicaid Other | Attending: Emergency Medicine | Admitting: Emergency Medicine

## 2020-05-07 DIAGNOSIS — J45909 Unspecified asthma, uncomplicated: Secondary | ICD-10-CM | POA: Insufficient documentation

## 2020-05-07 DIAGNOSIS — R0602 Shortness of breath: Secondary | ICD-10-CM | POA: Diagnosis present

## 2020-05-07 DIAGNOSIS — Z20822 Contact with and (suspected) exposure to covid-19: Secondary | ICD-10-CM | POA: Insufficient documentation

## 2020-05-07 DIAGNOSIS — Z87891 Personal history of nicotine dependence: Secondary | ICD-10-CM | POA: Insufficient documentation

## 2020-05-07 DIAGNOSIS — J441 Chronic obstructive pulmonary disease with (acute) exacerbation: Secondary | ICD-10-CM | POA: Diagnosis not present

## 2020-05-07 DIAGNOSIS — J449 Chronic obstructive pulmonary disease, unspecified: Secondary | ICD-10-CM | POA: Insufficient documentation

## 2020-05-07 DIAGNOSIS — Z79899 Other long term (current) drug therapy: Secondary | ICD-10-CM | POA: Diagnosis not present

## 2020-05-07 LAB — CBC WITH DIFFERENTIAL/PLATELET
Abs Immature Granulocytes: 0.01 10*3/uL (ref 0.00–0.07)
Basophils Absolute: 0.1 10*3/uL (ref 0.0–0.1)
Basophils Relative: 1 %
Eosinophils Absolute: 0.5 10*3/uL (ref 0.0–0.5)
Eosinophils Relative: 6 %
HCT: 46.3 % (ref 39.0–52.0)
Hemoglobin: 15.2 g/dL (ref 13.0–17.0)
Immature Granulocytes: 0 %
Lymphocytes Relative: 47 %
Lymphs Abs: 3.4 10*3/uL (ref 0.7–4.0)
MCH: 29.7 pg (ref 26.0–34.0)
MCHC: 32.8 g/dL (ref 30.0–36.0)
MCV: 90.6 fL (ref 80.0–100.0)
Monocytes Absolute: 0.5 10*3/uL (ref 0.1–1.0)
Monocytes Relative: 7 %
Neutro Abs: 2.7 10*3/uL (ref 1.7–7.7)
Neutrophils Relative %: 39 %
Platelets: 218 10*3/uL (ref 150–400)
RBC: 5.11 MIL/uL (ref 4.22–5.81)
RDW: 13.3 % (ref 11.5–15.5)
WBC: 7.1 10*3/uL (ref 4.0–10.5)
nRBC: 0 % (ref 0.0–0.2)

## 2020-05-07 LAB — BASIC METABOLIC PANEL
Anion gap: 15 (ref 5–15)
BUN: 25 mg/dL — ABNORMAL HIGH (ref 8–23)
CO2: 24 mmol/L (ref 22–32)
Calcium: 9.1 mg/dL (ref 8.9–10.3)
Chloride: 101 mmol/L (ref 98–111)
Creatinine, Ser: 0.88 mg/dL (ref 0.61–1.24)
GFR calc Af Amer: >60 mL/min (ref >60)
GFR calc non Af Amer: >60 mL/min (ref >60)
Glucose, Bld: 119 mg/dL — ABNORMAL HIGH (ref 70–99)
Potassium: 3.4 mmol/L — ABNORMAL LOW (ref 3.5–5.1)
Sodium: 140 mmol/L (ref 135–145)

## 2020-05-07 LAB — SARS CORONAVIRUS 2 BY RT PCR (HOSPITAL ORDER, PERFORMED IN ~~LOC~~ HOSPITAL LAB): SARS Coronavirus 2: NEGATIVE

## 2020-05-07 MED ORDER — IPRATROPIUM BROMIDE 0.02 % IN SOLN
0.5000 mg | Freq: Once | RESPIRATORY_TRACT | Status: AC
  Administered 2020-05-07: 0.5 mg via RESPIRATORY_TRACT
  Filled 2020-05-07: qty 2.5

## 2020-05-07 MED ORDER — ALBUTEROL SULFATE HFA 108 (90 BASE) MCG/ACT IN AERS
8.0000 | INHALATION_SPRAY | Freq: Once | RESPIRATORY_TRACT | Status: AC
  Administered 2020-05-07: 8 via RESPIRATORY_TRACT
  Filled 2020-05-07: qty 6.7

## 2020-05-07 MED ORDER — ALBUTEROL SULFATE (2.5 MG/3ML) 0.083% IN NEBU
5.0000 mg | INHALATION_SOLUTION | Freq: Once | RESPIRATORY_TRACT | Status: AC
  Administered 2020-05-07: 5 mg via RESPIRATORY_TRACT
  Filled 2020-05-07: qty 6

## 2020-05-07 MED ORDER — IPRATROPIUM-ALBUTEROL 0.5-2.5 (3) MG/3ML IN SOLN: 3.0000 mL | Freq: Four times a day (QID) | RESPIRATORY_TRACT | 0 refills | Status: DC | PRN

## 2020-05-07 MED ORDER — METHYLPREDNISOLONE SODIUM SUCC 125 MG IJ SOLR
125.0000 mg | Freq: Once | INTRAMUSCULAR | Status: AC
  Administered 2020-05-07: 125 mg via INTRAVENOUS
  Filled 2020-05-07: qty 2

## 2020-05-07 MED ORDER — PREDNISONE 10 MG (21) PO TBPK
ORAL_TABLET | ORAL | 0 refills | Status: DC
Start: 2020-05-07 — End: 2020-05-27

## 2020-05-07 NOTE — ED Provider Notes (Signed)
Pt signed out by Dr. Bebe Shaggy pending symptomatic improvement.  Pt feels much better after the steroids and nebs.  He feels back to nl.  He is almost out of his albuterol for his neb tx, so is given a refill.  He is also d/c with a prednisone taper.  He knows to return if worse.  F/u with pcp.   Jacalyn Lefevre, MD 05/07/20 [phone]

## 2020-05-07 NOTE — ED Triage Notes (Signed)
Pt c/o sob x 2 days and productive cough.

## 2020-05-07 NOTE — ED Provider Notes (Signed)
[facility] EMERGENCY DEPARTMENT Provider Note   CSN: [id number] Arrival date & time: 05/07/20  2229     History Chief Complaint  Patient presents with  . Shortness of Breath    Bryan Kelley is a 64 y.o. male.  The history is provided by the patient and the spouse.  Shortness of Breath Severity:  Severe Onset quality:  Gradual Duration:  2 days Timing:  Intermittent Progression:  Worsening Chronicity:  New Relieved by:  Nothing Worsened by:  Nothing Associated symptoms: cough and wheezing   Associated symptoms: no chest pain, no fever, no hemoptysis and no vomiting   Patient with history of COPD presents with cough and wheezing.  He had increasing cough and shortness of breath for the past 2 days.  No fevers.  No hemoptysis.  No chest pain.     Past Medical History:  Diagnosis Date  . Asthma   . COPD (chronic obstructive pulmonary disease) ([facility])   . Depression   . GERD (gastroesophageal reflux disease)   . Vision loss, left eye     Patient Active Problem List   Diagnosis Date Noted  . Acute on chronic respiratory failure with hypoxia ([facility]) 12/06/2019  . Nausea vomiting and diarrhea 12/06/2019  . Acute respiratory disease due to COVID-19 virus 12/06/2019  . COPD (chronic obstructive pulmonary disease) ([facility]) 10/30/2019  . Hyperglycemia   . COPD with acute exacerbation ([facility]) 10/29/2019  . COPD exacerbation ([facility]) 10/29/2019  . Epigastric abdominal pain 08/12/2012  . Alcohol abuse 03/14/2012  . Elevated BP 12/07/2011  . ED (erectile dysfunction) 07/28/2011  . NICOTINE ADDICTION 10/29/2009  . ACUTE BRONCHITIS 05/07/2009  . 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 ANKLE PAIN, RIGHT 10/04/2008  . FATIGUE 10/04/2008  . ANXIETY 02/17/2008  . ASTHMA 02/17/2008  . GERD 02/17/2008  . BPH (benign prostatic hyperplasia) 02/17/2008    Past Surgical History:  Procedure Laterality Date  . EYE SURGERY  2009, about 7989,2119   steel in left eye on the job,  legally blind        Family History  Problem Relation Age of Onset  . Diabetes Mother   . Hypertension Mother   . Stroke Mother   . Asthma Father   . Diabetes Father   . Diabetes Sister   . Hypertension Sister   . Diabetes Brother   . Hypertension Brother     Social History   Tobacco Use  . Smoking status: Former Smoker    Packs/day: 0.50  . Smokeless tobacco: Never Used  Vaping Use  . Vaping Use: Never used  Substance Use Topics  . Alcohol use: Yes    Comment: vodka on the weekends  . Drug use: No    Home Medications Prior to Admission medications   Medication Sig Start Date End Date Taking? Authorizing Provider  albuterol (VENTOLIN HFA) 108 (90 Base) MCG/ACT inhaler Inhale 2 puffs into the lungs every 6 (six) hours as needed for wheezing or shortness of breath. 11/30/19   Don Perking, [place], MD  Brinzolamide-Brimonidine 1-0.2 % SUSP Apply 1 drop to eye daily. Into left eye. 11/01/19   Lurene Shadow, MD  budesonide-formoterol [facility]) 160-4.5 MCG/ACT inhaler Inhale 2 puffs into the lungs 2 (two) times daily.    [provider]  Dextran 70-Hypromellose (ARTIFICIAL TEARS) 0.1-0.3 % SOLN Apply 1 drop to eye 4 (four) times daily as needed. In both eyes for dry eyes. 01/26/18   [provider]  hydrochlorothiazide (MICROZIDE) 12.5 MG capsule Take 1 capsule (12.5  mg total) by mouth daily. 12/12/19 01/11/20  Lurene Shadow, MD  ipratropium (ATROVENT HFA) 17 MCG/ACT inhaler Inhale 2 puffs into the lungs 3 (three) times daily.    [provider]  ipratropium-albuterol (DUONEB) 0.5-2.5 (3) MG/3ML SOLN Take 3 mLs by nebulization every 6 (six) hours as needed. 11/01/19   Lurene Shadow, MD  lidocaine (XYLOCAINE) 2 % solution Use as directed 5 mLs in the mouth or throat every 6 (six) hours as needed for mouth pain. For oral swish and swallow. 12/26/19   Joni Reining, PA-C  lisinopril (ZESTRIL) 10 MG tablet Take 1 tablet (10 mg total) by mouth daily. 12/12/19  01/11/20  Lurene Shadow, MD  omeprazole (PRILOSEC) 20 MG capsule Take 1 capsule (20 mg total) by mouth daily. 08/12/12   Salley Scarlet, MD  tamsulosin (FLOMAX) 0.4 MG CAPS capsule Take 0.4 mg by mouth daily with breakfast. Take 30 minutes after breakfast.    [provider]    Allergies    Patient has no known allergies.  Review of Systems   Review of Systems  Constitutional: Negative for fever.  Respiratory: Positive for cough, shortness of breath and wheezing. Negative for hemoptysis.   Cardiovascular: Negative for chest pain.  Gastrointestinal: Negative for vomiting.  All other systems reviewed and are negative.   Physical Exam Updated Vital Signs BP (!) 143/87   Pulse 81   Temp 98.4 F (36.9 C) (Oral)   Resp 15   Ht 1.676 m (5\' 6" )   Wt 77 kg   SpO2 92%   BMI 27.40 kg/m   Physical Exam CONSTITUTIONAL: Well developed/well nourished, mild distress HEAD: Normocephalic/atraumatic EYES: EOMI/PERRL ENMT: Mucous membranes moist NECK: supple no meningeal signs SPINE/BACK:entire spine nontender CV: S1/S2 noted, no murmurs/rubs/gallops noted LUNGS: Tachypnea, wheezing bilaterally ABDOMEN: soft, nontender GU:no cva tenderness NEURO: Pt is awake/alert/appropriate, moves all extremitiesx4.  No facial droop.  EXTREMITIES: pulses normal/equal, full ROM, no lower extremity edema SKIN: warm, color normal PSYCH: Mildly anxious ED Results / Procedures / Treatments   Labs (all labs ordered are listed, but only abnormal results are displayed) Labs Reviewed  BASIC METABOLIC PANEL - Abnormal; Notable for the following components:      Result Value   Potassium 3.4 (*)    Glucose, Bld 119 (*)    BUN 25 (*)    All other components within normal limits  SARS CORONAVIRUS 2 BY RT PCR [facility] ORDER, PERFORMED IN [facility])  CBC WITH DIFFERENTIAL/PLATELET    EKG EKG Interpretation  Date/Time:  Monday May 07 2020 05:47:00 EDT Ventricular Rate:  70 PR  Interval:    QRS Duration: 91 QT Interval:  417 QTC Calculation: 450 R Axis:   84 Text Interpretation: Sinus rhythm Borderline right axis deviation Consider left ventricular hypertrophy Confirmed by [id number] (Zadie Rhine) on 05/07/2020 5:51:33 AM   Radiology DG Chest Port 1 View  Result Date: 05/07/2020 CLINICAL DATA:  65 year old male with history of shortness of breath. EXAM: PORTABLE CHEST 1 VIEW COMPARISON:  Chest x-ray 12/26/2019. FINDINGS: Lung volumes are normal. No consolidative airspace disease. No pleural effusions. No pneumothorax. No pulmonary nodule or mass noted. Pulmonary vasculature and the cardiomediastinal silhouette are within normal limits. Atherosclerosis in the thoracic aorta. Small metallic density projecting over the right upper quadrant, likely to represent a BB. IMPRESSION: 1.  No radiographic evidence of acute cardiopulmonary disease. 2. Aortic atherosclerosis. Electronically Signed   By: 12/28/2019 M.D.   On: 05/07/2020 06:39  Procedures Procedures  Medications Ordered in ED Medications  albuterol (PROVENTIL) (2.5 MG/3ML) 0.083% nebulizer solution 5 mg (has no administration in time range)  ipratropium (ATROVENT) nebulizer solution 0.5 mg (has no administration in time range)  albuterol (VENTOLIN HFA) 108 (90 Base) MCG/ACT inhaler 8 puff (8 puffs Inhalation Given 05/07/20 0541)  methylPREDNISolone sodium succinate (SOLU-MEDROL) 125 mg/2 mL injection 125 mg (125 mg Intravenous Given 05/07/20 0541)    ED Course  I have reviewed the triage vital signs and the nursing notes.  Pertinent labs & imaging results that were available during my care of the patient were reviewed by me and considered in my medical decision making (see chart for details).    MDM Rules/Calculators/A&P                           6:40 AM Suspicion for COPD exacerbation.  Patient was given albuterol and steroids.  He will require further nebulized therapy once his Covid is resulted.   Patient did have COVID-19 back in February 6:55 AM Signed out to Dr. Particia Nearing at shift change Reassess after nebs, if improved can be discharged   This patient presents to the ED for concern of shortness of breath, this involves an extensive number of treatment options, and is a complaint that carries with it a high risk of complications and morbidity.  The differential diagnosis includes COPD exacerbation, pneumonia, CHF   Lab Tests:   I Ordered, reviewed, and interpreted labs, which included electrolytes, COVID-19 testing, complete blood count  Medicines ordered:   I ordered medication albuterol and Solu-Medrol for COPD exacerbation  Imaging Studies ordered:   I ordered imaging studies which included chest x-ray   I independently visualized and interpreted imaging which showed no acute findings  Additional history obtained:   Additional history obtained from wife  Previous records obtained and reviewed    Reevaluation:  After the interventions stated above, I reevaluated the patient and found patient stable   Final Clinical Impression(s) / ED Diagnoses Final diagnoses:  COPD exacerbation [facility])    Rx / DC Orders ED Discharge Orders    None       Zadie Rhine, MD 05/07/20 [phone]

## 2020-05-10 ENCOUNTER — Other Ambulatory Visit: Payer: Self-pay

## 2020-05-10 ENCOUNTER — Ambulatory Visit: Payer: Medicaid Other | Admitting: Pulmonary Disease

## 2020-05-10 ENCOUNTER — Encounter: Payer: Self-pay | Admitting: Pulmonary Disease

## 2020-05-10 VITALS — BP 144/80 | HR 84 | Temp 98.0°F | Ht 66.0 in | Wt 177.6 lb

## 2020-05-10 DIAGNOSIS — R059 Cough, unspecified: Secondary | ICD-10-CM

## 2020-05-10 DIAGNOSIS — J209 Acute bronchitis, unspecified: Secondary | ICD-10-CM

## 2020-05-10 DIAGNOSIS — J441 Chronic obstructive pulmonary disease with (acute) exacerbation: Secondary | ICD-10-CM

## 2020-05-10 DIAGNOSIS — Z87891 Personal history of nicotine dependence: Secondary | ICD-10-CM

## 2020-05-10 DIAGNOSIS — J449 Chronic obstructive pulmonary disease, unspecified: Secondary | ICD-10-CM

## 2020-05-10 MED ORDER — METHYLPREDNISOLONE ACETATE 80 MG/ML IJ SUSP: 120.0000 mg | Freq: Once | INTRAMUSCULAR | Status: DC

## 2020-05-10 MED ORDER — SPIRIVA RESPIMAT 2.5 MCG/ACT IN AERS
2.0000 | INHALATION_SPRAY | Freq: Every day | RESPIRATORY_TRACT | 0 refills | Status: DC
Start: 2020-05-10 — End: 2020-07-30

## 2020-05-10 MED ORDER — CLARITHROMYCIN 500 MG PO TABS: 500.0000 mg | ORAL_TABLET | Freq: Two times a day (BID) | ORAL | 0 refills | Status: AC

## 2020-05-10 MED ORDER — METHYLPREDNISOLONE ACETATE 80 MG/ML IJ SUSP
120.0000 mg | Freq: Once | INTRAMUSCULAR | Status: AC
  Administered 2020-05-10: 120 mg via INTRAMUSCULAR

## 2020-05-10 NOTE — Patient Instructions (Signed)
   Continue Symbicort (red inhaler) 2 puffs twice a day  We have added Spiriva 2 puffs daily, use it in the morning after you use the Symbicort  We gave you a shot to help you with your breathing today  Prescription was sent to your pharmacy for an antibiotic to take twice a day  We will see you in follow-up in 3 to 4 weeks time you will either see me or the nurse practitioner  Your oxygen level was good today even while walking.

## 2020-05-10 NOTE — Progress Notes (Signed)
 Assessment & Plan:  1. Stage 4 very severe COPD by GOLD classification (HCC) (Primary) Comments: Continue Symbicort  (read inhaler) 2 puffs twice a day  We are adding Spiriva  2 puffs once daily Use your nebulizer only as needed   2. COPD with acute exacerbation (HCC) Comments: Depo-Medrol  120 mg IM x1 Biaxin  500 mg twice daily x7 days - methylPREDNISolone  acetate (DEPO-MEDROL ) injection 120 mg  3. Acute bronchitis, unspecified organism Comments: Biaxin  500 mg twice daily for 7 days  4. Cough Comments: Acute on chronic Acute due to COPD exacerbation Chronic element may be related to ACE inhibitor Recommend switching ACE inhibitor to ARB if possible  5. Former smoker Comments: Patient was commended on quitting smoking   Patient Instructions  Continue Symbicort  (red inhaler) 2 puffs twice a day We have added Spiriva  2 puffs daily, use it in the morning after you use the Symbicort  We gave you a shot to help you with your breathing today Prescription was sent to your pharmacy for an antibiotic to take twice a day We will see you in follow-up in 3 to 4 weeks time you will either see me or the nurse practitioner Your oxygen  level was good today even while walking.  Please note: late entry documentation due to logistical difficulties during COVID-19 pandemic. This note is filed for information purposes only, and is not intended to be used for billing, nor does it represent the full scope/nature of the visit in question. Please see any associated scanned media linked to date of encounter for additional pertinent information.  Subjective:    HPI: Bryan Kelley is a 64 y.o. male presenting to the pulmonology clinic on 05/10/2020 with report of: pulmonary consult (per Verneita Char PA--hx of COPD. c/o sob with exertion, prod cough with yellow to brown mucus and wheezing)     Outpatient Encounter Medications as of 05/10/2020  Medication Sig Note   albuterol  (VENTOLIN  HFA) 108 (90  Base) MCG/ACT inhaler Inhale 2 puffs into the lungs every 6 (six) hours as needed for wheezing or shortness of breath.    [DISCONTINUED] Brinzolamide -Brimonidine  1-0.2 % SUSP Apply 1 drop to eye daily. Into left eye.    [DISCONTINUED] budesonide -formoterol  (SYMBICORT ) 160-4.5 MCG/ACT inhaler Inhale 2 puffs into the lungs 2 (two) times daily.    [DISCONTINUED] Dextran 70-Hypromellose (ARTIFICIAL TEARS) 0.1-0.3 % SOLN Apply 1 drop to eye 4 (four) times daily as needed. In both eyes for dry eyes.    [DISCONTINUED] ipratropium (ATROVENT  HFA) 17 MCG/ACT inhaler Inhale 2 puffs into the lungs 3 (three) times daily.    [DISCONTINUED] ipratropium-albuterol  (DUONEB) 0.5-2.5 (3) MG/3ML SOLN Take 3 mLs by nebulization every 6 (six) hours as needed.    [DISCONTINUED] lidocaine  (XYLOCAINE ) 2 % solution Use as directed 5 mLs in the mouth or throat every 6 (six) hours as needed for mouth pain. For oral swish and swallow.    [DISCONTINUED] omeprazole  (PRILOSEC) 20 MG capsule Take 1 capsule (20 mg total) by mouth daily.    [DISCONTINUED] predniSONE  (STERAPRED UNI-PAK 21 TAB) 10 MG (21) TBPK tablet Take 6 tabs for 2 days, then 5 for 2 days, then 4 for 2 days, then 3 for 2 days, 2 for 2 days, then 1 for 2 days    [DISCONTINUED] tamsulosin  (FLOMAX ) 0.4 MG CAPS capsule Take 0.4 mg by mouth daily with breakfast. Take 30 minutes after breakfast. 10/29/2019: Pt takes after breakfast.   [EXPIRED] clarithromycin  (BIAXIN ) 500 MG tablet Take 1 tablet (500 mg total) by mouth  2 (two) times daily for 7 days.    [DISCONTINUED] hydrochlorothiazide  (MICROZIDE ) 12.5 MG capsule Take 1 capsule (12.5 mg total) by mouth daily.    [DISCONTINUED] lisinopril  (ZESTRIL ) 10 MG tablet Take 1 tablet (10 mg total) by mouth daily.    [DISCONTINUED] Tiotropium Bromide  Monohydrate (SPIRIVA  RESPIMAT) 2.5 MCG/ACT AERS Inhale 2 puffs into the lungs daily.    [EXPIRED] methylPREDNISolone  acetate (DEPO-MEDROL ) injection 120 mg     [DISCONTINUED]  methylPREDNISolone  acetate (DEPO-MEDROL ) injection 120 mg     No facility-administered encounter medications on file as of 05/10/2020.      Objective:   Vitals:   05/10/20 1512  BP: (!) 144/80  Pulse: 84  Temp: 98 F (36.7 C)  Height: 5' 6 (1.676 m)  Weight: 177 lb 9.6 oz (80.6 kg)  SpO2: 96%  TempSrc: Temporal  BMI (Calculated): 28.68     Physical exam documentation is limited by delayed entry of information.

## 2020-05-27 ENCOUNTER — Other Ambulatory Visit: Payer: Self-pay

## 2020-05-27 ENCOUNTER — Emergency Department: Payer: Medicaid Other

## 2020-05-27 ENCOUNTER — Emergency Department
Admission: EM | Admit: 2020-05-27 | Discharge: 2020-05-27 | Disposition: A | Discharge status: Home / Self Care | Payer: Medicaid Other | Attending: Student in an Organized Health Care Education/Training Program | Admitting: Student in an Organized Health Care Education/Training Program

## 2020-05-27 DIAGNOSIS — Z79899 Other long term (current) drug therapy: Secondary | ICD-10-CM | POA: Insufficient documentation

## 2020-05-27 DIAGNOSIS — S43102A Unspecified dislocation of left acromioclavicular joint, initial encounter: Secondary | ICD-10-CM | POA: Insufficient documentation

## 2020-05-27 DIAGNOSIS — Y929 Unspecified place or not applicable: Secondary | ICD-10-CM | POA: Insufficient documentation

## 2020-05-27 DIAGNOSIS — J441 Chronic obstructive pulmonary disease with (acute) exacerbation: Secondary | ICD-10-CM | POA: Diagnosis not present

## 2020-05-27 DIAGNOSIS — R0602 Shortness of breath: Secondary | ICD-10-CM | POA: Diagnosis not present

## 2020-05-27 DIAGNOSIS — S43005A Unspecified dislocation of left shoulder joint, initial encounter: Secondary | ICD-10-CM

## 2020-05-27 DIAGNOSIS — F17210 Nicotine dependence, cigarettes, uncomplicated: Secondary | ICD-10-CM | POA: Diagnosis not present

## 2020-05-27 DIAGNOSIS — Y9389 Activity, other specified: Secondary | ICD-10-CM | POA: Insufficient documentation

## 2020-05-27 DIAGNOSIS — Y998 Other external cause status: Secondary | ICD-10-CM | POA: Diagnosis not present

## 2020-05-27 DIAGNOSIS — S4992XA Unspecified injury of left shoulder and upper arm, initial encounter: Secondary | ICD-10-CM | POA: Diagnosis present

## 2020-05-27 LAB — CBC
HCT: 45.8 % (ref 39.0–52.0)
Hemoglobin: 16 g/dL (ref 13.0–17.0)
MCH: 30.6 pg (ref 26.0–34.0)
MCHC: 34.9 g/dL (ref 30.0–36.0)
MCV: 87.6 fL (ref 80.0–100.0)
Platelets: 213 10*3/uL (ref 150–400)
RBC: 5.23 MIL/uL (ref 4.22–5.81)
RDW: 13.5 % (ref 11.5–15.5)
WBC: 9.7 10*3/uL (ref 4.0–10.5)
nRBC: 0 % (ref 0.0–0.2)

## 2020-05-27 LAB — BASIC METABOLIC PANEL
Anion gap: 8 (ref 5–15)
BUN: 17 mg/dL (ref 8–23)
CO2: 29 mmol/L (ref 22–32)
Calcium: 9.1 mg/dL (ref 8.9–10.3)
Chloride: 101 mmol/L (ref 98–111)
Creatinine, Ser: 1.04 mg/dL (ref 0.61–1.24)
GFR calc Af Amer: >60 mL/min (ref >60)
GFR calc non Af Amer: >60 mL/min (ref >60)
Glucose, Bld: 128 mg/dL — ABNORMAL HIGH (ref 70–99)
Potassium: 4.5 mmol/L (ref 3.5–5.1)
Sodium: 138 mmol/L (ref 135–145)

## 2020-05-27 LAB — TROPONIN I (HIGH SENSITIVITY)
Troponin I (High Sensitivity): 11 ng/L (ref <18)
Troponin I (High Sensitivity): 16 ng/L (ref <18)

## 2020-05-27 MED ORDER — METHYLPREDNISOLONE SODIUM SUCC 125 MG IJ SOLR
125.0000 mg | Freq: Once | INTRAMUSCULAR | Status: AC
  Filled 2020-05-27: qty 2

## 2020-05-27 MED ORDER — ONDANSETRON HCL 4 MG/2ML IJ SOLN
4.0000 mg | Freq: Once | INTRAMUSCULAR | Status: AC
  Administered 2020-05-27: 4 mg via INTRAVENOUS
  Filled 2020-05-27: qty 2

## 2020-05-27 MED ORDER — METHYLPREDNISOLONE SODIUM SUCC 125 MG IJ SOLR
INTRAMUSCULAR | Status: AC
  Administered 2020-05-27: 125 mg via INTRAMUSCULAR
  Filled 2020-05-27: qty 2

## 2020-05-27 MED ORDER — HYDROCODONE-ACETAMINOPHEN 5-325 MG PO TABS: 1.0000 | ORAL_TABLET | ORAL | 0 refills | Status: DC | PRN

## 2020-05-27 MED ORDER — IPRATROPIUM-ALBUTEROL 0.5-2.5 (3) MG/3ML IN SOLN
6.0000 mL | Freq: Once | RESPIRATORY_TRACT | Status: AC
  Administered 2020-05-27: 6 mL via RESPIRATORY_TRACT
  Filled 2020-05-27: qty 3

## 2020-05-27 MED ORDER — PREDNISONE 50 MG PO TABS
50.0000 mg | ORAL_TABLET | Freq: Every day | ORAL | 0 refills | Status: DC
Start: 2020-05-27 — End: 2020-07-25

## 2020-05-27 MED ORDER — MORPHINE SULFATE (PF) 4 MG/ML IV SOLN
4.0000 mg | Freq: Once | INTRAVENOUS | Status: AC
  Administered 2020-05-27: 4 mg via INTRAVENOUS
  Filled 2020-05-27: qty 1

## 2020-05-27 NOTE — ED Notes (Signed)
Pt taken to xray 

## 2020-05-27 NOTE — ED Notes (Signed)
Pt unable to sign for discharge d/t being left handed
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
## 2020-05-27 NOTE — ED Notes (Signed)
Second repeat green top sent to lab- called lab and spoke to [facility] and asked them to please run it ASAP

## 2020-05-27 NOTE — ED Notes (Signed)
Lab at bedside to get bloodwork 

## 2020-05-27 NOTE — ED Notes (Signed)
Repeat green top sent to lab °

## 2020-06-04 ENCOUNTER — Ambulatory Visit: Payer: Medicaid Other | Admitting: Primary Care

## 2020-06-06 ENCOUNTER — Other Ambulatory Visit: Payer: Self-pay

## 2020-06-06 ENCOUNTER — Ambulatory Visit: Payer: Self-pay | Admitting: Physician Assistant

## 2020-06-06 DIAGNOSIS — Z113 Encounter for screening for infections with a predominantly sexual mode of transmission: Secondary | ICD-10-CM
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
## 2020-07-25 ENCOUNTER — Emergency Department: Payer: Medicaid Other

## 2020-07-25 ENCOUNTER — Other Ambulatory Visit: Payer: Self-pay

## 2020-07-25 ENCOUNTER — Inpatient Hospital Stay
Admission: EM | Admit: 2020-07-25 | Discharge: 2020-07-28 | DRG: 190 | Disposition: A | Discharge status: Home / Self Care | Payer: Medicaid Other | Attending: Internal Medicine | Admitting: Internal Medicine

## 2020-07-25 ENCOUNTER — Encounter: Payer: Self-pay | Admitting: Emergency Medicine

## 2020-07-25 DIAGNOSIS — F419 Anxiety disorder, unspecified: Secondary | ICD-10-CM | POA: Diagnosis present

## 2020-07-25 DIAGNOSIS — K219 Gastro-esophageal reflux disease without esophagitis: Secondary | ICD-10-CM | POA: Diagnosis present

## 2020-07-25 DIAGNOSIS — Z833 Family history of diabetes mellitus: Secondary | ICD-10-CM

## 2020-07-25 DIAGNOSIS — Z87891 Personal history of nicotine dependence: Secondary | ICD-10-CM | POA: Diagnosis present

## 2020-07-25 DIAGNOSIS — J441 Chronic obstructive pulmonary disease with (acute) exacerbation: Principal | ICD-10-CM | POA: Diagnosis present

## 2020-07-25 DIAGNOSIS — Z825 Family history of asthma and other chronic lower respiratory diseases: Secondary | ICD-10-CM

## 2020-07-25 DIAGNOSIS — Z79899 Other long term (current) drug therapy: Secondary | ICD-10-CM

## 2020-07-25 DIAGNOSIS — Z8249 Family history of ischemic heart disease and other diseases of the circulatory system: Secondary | ICD-10-CM

## 2020-07-25 DIAGNOSIS — Z720 Tobacco use: Secondary | ICD-10-CM | POA: Diagnosis not present

## 2020-07-25 DIAGNOSIS — Z7951 Long term (current) use of inhaled steroids: Secondary | ICD-10-CM

## 2020-07-25 DIAGNOSIS — Z823 Family history of stroke: Secondary | ICD-10-CM

## 2020-07-25 DIAGNOSIS — J9601 Acute respiratory failure with hypoxia: Secondary | ICD-10-CM | POA: Diagnosis present

## 2020-07-25 DIAGNOSIS — I10 Essential (primary) hypertension: Secondary | ICD-10-CM | POA: Diagnosis not present

## 2020-07-25 DIAGNOSIS — H548 Legal blindness, as defined in USA: Secondary | ICD-10-CM | POA: Diagnosis present

## 2020-07-25 DIAGNOSIS — F17210 Nicotine dependence, cigarettes, uncomplicated: Secondary | ICD-10-CM | POA: Diagnosis present

## 2020-07-25 DIAGNOSIS — Z20822 Contact with and (suspected) exposure to covid-19: Secondary | ICD-10-CM | POA: Diagnosis present

## 2020-07-25 DIAGNOSIS — F329 Major depressive disorder, single episode, unspecified: Secondary | ICD-10-CM | POA: Diagnosis present

## 2020-07-25 DIAGNOSIS — F1010 Alcohol abuse, uncomplicated: Secondary | ICD-10-CM | POA: Diagnosis present

## 2020-07-25 DIAGNOSIS — N400 Enlarged prostate without lower urinary tract symptoms: Secondary | ICD-10-CM | POA: Diagnosis present

## 2020-07-25 DIAGNOSIS — Z7952 Long term (current) use of systemic steroids: Secondary | ICD-10-CM

## 2020-07-25 LAB — COMPREHENSIVE METABOLIC PANEL
ALT: 24 U/L (ref 0–44)
AST: 27 U/L (ref 15–41)
Albumin: 4.2 g/dL (ref 3.5–5.0)
Alkaline Phosphatase: 78 U/L (ref 38–126)
Anion gap: 13 (ref 5–15)
BUN: 19 mg/dL (ref 8–23)
CO2: 25 mmol/L (ref 22–32)
Calcium: 9.3 mg/dL (ref 8.9–10.3)
Chloride: 100 mmol/L (ref 98–111)
Creatinine, Ser: 0.99 mg/dL (ref 0.61–1.24)
GFR calc Af Amer: >60 mL/min (ref >60)
GFR calc non Af Amer: >60 mL/min (ref >60)
Glucose, Bld: 141 mg/dL — ABNORMAL HIGH (ref 70–99)
Potassium: 3.5 mmol/L (ref 3.5–5.1)
Sodium: 138 mmol/L (ref 135–145)
Total Bilirubin: 0.7 mg/dL (ref 0.3–1.2)
Total Protein: 7.6 g/dL (ref 6.5–8.1)

## 2020-07-25 LAB — TROPONIN I (HIGH SENSITIVITY): Troponin I (High Sensitivity): 10 ng/L (ref <18)

## 2020-07-25 LAB — CBC
HCT: 41.3 % (ref 39.0–52.0)
Hemoglobin: 14.2 g/dL (ref 13.0–17.0)
MCH: 31.3 pg (ref 26.0–34.0)
MCHC: 34.4 g/dL (ref 30.0–36.0)
MCV: 91 fL (ref 80.0–100.0)
Platelets: 272 10*3/uL (ref 150–400)
RBC: 4.54 MIL/uL (ref 4.22–5.81)
RDW: 14.1 % (ref 11.5–15.5)
WBC: 10.5 10*3/uL (ref 4.0–10.5)
nRBC: 0 % (ref 0.0–0.2)

## 2020-07-25 LAB — HIV ANTIBODY (ROUTINE TESTING W REFLEX): HIV Screen 4th Generation wRfx: NONREACTIVE

## 2020-07-25 LAB — BRAIN NATRIURETIC PEPTIDE: B Natriuretic Peptide: 34.2 pg/mL (ref 0.0–100.0)

## 2020-07-25 LAB — SARS CORONAVIRUS 2 BY RT PCR (HOSPITAL ORDER, PERFORMED IN ~~LOC~~ HOSPITAL LAB): SARS Coronavirus 2: NEGATIVE

## 2020-07-25 MED ORDER — FOLIC ACID 1 MG PO TABS
1.0000 mg | ORAL_TABLET | Freq: Every day | ORAL | Status: DC
  Administered 2020-07-25 – 2020-07-28 (×4): 1 mg via ORAL
  Filled 2020-07-25 (×4): qty 1

## 2020-07-25 MED ORDER — NICOTINE 21 MG/24HR TD PT24
21.0000 mg | MEDICATED_PATCH | Freq: Every day | TRANSDERMAL | Status: DC
  Administered 2020-07-25 – 2020-07-28 (×4): 21 mg via TRANSDERMAL
  Filled 2020-07-25 (×4): qty 1

## 2020-07-25 MED ORDER — IPRATROPIUM-ALBUTEROL 0.5-2.5 (3) MG/3ML IN SOLN
3.0000 mL | Freq: Once | RESPIRATORY_TRACT | Status: AC
  Administered 2020-07-25: 3 mL via RESPIRATORY_TRACT
  Filled 2020-07-25: qty 3

## 2020-07-25 MED ORDER — LORAZEPAM 2 MG/ML IJ SOLN
0.0000 mg | Freq: Four times a day (QID) | INTRAMUSCULAR | Status: AC
  Administered 2020-07-25 – 2020-07-26 (×2): 1 mg via INTRAVENOUS
  Filled 2020-07-25 (×2): qty 1

## 2020-07-25 MED ORDER — IPRATROPIUM-ALBUTEROL 0.5-2.5 (3) MG/3ML IN SOLN
3.0000 mL | RESPIRATORY_TRACT | Status: DC
  Administered 2020-07-25 – 2020-07-26 (×6): 3 mL via RESPIRATORY_TRACT
  Filled 2020-07-25 (×5): qty 3

## 2020-07-25 MED ORDER — ALBUTEROL SULFATE (2.5 MG/3ML) 0.083% IN NEBU
2.5000 mg | INHALATION_SOLUTION | RESPIRATORY_TRACT | Status: DC | PRN
  Administered 2020-07-26: 2.5 mg via RESPIRATORY_TRACT
  Filled 2020-07-25: qty 3

## 2020-07-25 MED ORDER — ONDANSETRON HCL 4 MG/2ML IJ SOLN: 4.0000 mg | Freq: Three times a day (TID) | INTRAMUSCULAR | Status: DC | PRN

## 2020-07-25 MED ORDER — AZITHROMYCIN 250 MG PO TABS
250.0000 mg | ORAL_TABLET | Freq: Every day | ORAL | Status: DC
  Administered 2020-07-26 – 2020-07-28 (×3): 250 mg via ORAL
  Filled 2020-07-25 (×3): qty 1

## 2020-07-25 MED ORDER — THIAMINE HCL 100 MG/ML IJ SOLN
100.0000 mg | Freq: Every day | INTRAMUSCULAR | Status: DC
  Administered 2020-07-25: 100 mg via INTRAVENOUS
  Filled 2020-07-25 (×2): qty 2

## 2020-07-25 MED ORDER — THIAMINE HCL 100 MG PO TABS
100.0000 mg | ORAL_TABLET | Freq: Every day | ORAL | Status: DC
  Administered 2020-07-26 – 2020-07-28 (×3): 100 mg via ORAL
  Filled 2020-07-25 (×3): qty 1

## 2020-07-25 MED ORDER — HYDRALAZINE HCL 20 MG/ML IJ SOLN: 5.0000 mg | INTRAMUSCULAR | Status: DC | PRN

## 2020-07-25 MED ORDER — MAGNESIUM SULFATE 2 GM/50ML IV SOLN
2.0000 g | Freq: Once | INTRAVENOUS | Status: AC
  Administered 2020-07-25: 2 g via INTRAVENOUS
  Filled 2020-07-25: qty 50

## 2020-07-25 MED ORDER — LORAZEPAM 2 MG/ML IJ SOLN: 1.0000 mg | INTRAMUSCULAR | Status: DC | PRN

## 2020-07-25 MED ORDER — ENOXAPARIN SODIUM 40 MG/0.4ML ~~LOC~~ SOLN
40.0000 mg | SUBCUTANEOUS | Status: DC
  Administered 2020-07-25 – 2020-07-27 (×3): 40 mg via SUBCUTANEOUS
  Filled 2020-07-25 (×3): qty 0.4

## 2020-07-25 MED ORDER — DM-GUAIFENESIN ER 30-600 MG PO TB12
1.0000 | ORAL_TABLET | Freq: Two times a day (BID) | ORAL | Status: DC | PRN
  Administered 2020-07-25 – 2020-07-26 (×2): 1 via ORAL
  Filled 2020-07-25 (×3): qty 1

## 2020-07-25 MED ORDER — LORAZEPAM 2 MG/ML IJ SOLN: 0.0000 mg | Freq: Two times a day (BID) | INTRAMUSCULAR | Status: DC

## 2020-07-25 MED ORDER — METHYLPREDNISOLONE SODIUM SUCC 40 MG IJ SOLR
40.0000 mg | Freq: Two times a day (BID) | INTRAMUSCULAR | Status: DC
  Administered 2020-07-25 – 2020-07-26 (×2): 40 mg via INTRAVENOUS
  Filled 2020-07-25 (×2): qty 1

## 2020-07-25 MED ORDER — ADULT MULTIVITAMIN W/MINERALS CH
1.0000 | ORAL_TABLET | Freq: Every day | ORAL | Status: DC
  Administered 2020-07-25 – 2020-07-28 (×4): 1 via ORAL
  Filled 2020-07-25 (×4): qty 1

## 2020-07-25 MED ORDER — LORAZEPAM 1 MG PO TABS: 1.0000 mg | ORAL_TABLET | ORAL | Status: DC | PRN

## 2020-07-25 MED ORDER — ACETAMINOPHEN 325 MG PO TABS: 650.0000 mg | ORAL_TABLET | Freq: Four times a day (QID) | ORAL | Status: DC | PRN

## 2020-07-25 MED ORDER — AZITHROMYCIN 500 MG PO TABS
500.0000 mg | ORAL_TABLET | Freq: Every day | ORAL | Status: AC
  Administered 2020-07-25: 500 mg via ORAL
  Filled 2020-07-25: qty 1

## 2020-07-25 NOTE — ED Notes (Signed)
Pt awake, upon this RN arrival to bedside, pt removed Bipap and sitting up in bed eating graham crackers and peanut butter provided by unknown staff member. This RN instructed patient to use call bell next time. Pt states understanding. Pt placed on 2L via [place] at this time.

## 2020-07-25 NOTE — ED Provider Notes (Signed)
[facility] Emergency Department Provider Note   ____________________________________________    I have reviewed the triage vital signs and the nursing notes.   HISTORY  Chief Complaint Shortness of Breath   History limited by respiratory distress  HPI Bryan Kelley is a 64 y.o. male with history of COPD who presents with respiratory distress. Per EMS patient has a history of COPD, they have treated with IV Solu-Medrol, multiple duo nebs after finding the patient with severe shortness of breath and hypoxia. Does have a dry cough but is able to deny fevers.  Past Medical History:  Diagnosis Date  . Asthma   . COPD (chronic obstructive pulmonary disease) (HCC)   . Depression   . GERD (gastroesophageal reflux disease)   . Vision loss, left eye     Patient Active Problem List   Diagnosis Date Noted  . Acute on chronic respiratory failure with hypoxia (HCC) 12/06/2019  . Nausea vomiting and diarrhea 12/06/2019  . Acute respiratory disease due to COVID-19 virus 12/06/2019  . COPD (chronic obstructive pulmonary disease) (HCC) 10/30/2019  . Hyperglycemia   . COPD with acute exacerbation (HCC) 10/29/2019  . COPD exacerbation (HCC) 10/29/2019  . Epigastric abdominal pain 08/12/2012  . Alcohol abuse 03/14/2012  . Elevated BP 12/07/2011  . ED (erectile dysfunction) 07/28/2011  . NICOTINE ADDICTION 10/29/2009  . ACUTE BRONCHITIS 05/07/2009  . 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 ANKLE PAIN, RIGHT 10/04/2008  . FATIGUE 10/04/2008  . ANXIETY 02/17/2008  . ASTHMA 02/17/2008  . GERD 02/17/2008  . BPH (benign prostatic hyperplasia) 02/17/2008    Past Surgical History:  Procedure Laterality Date  . EYE SURGERY  2009, about [id number]   steel in left eye on the job, legally blind     Prior to Admission medications   Medication Sig Start Date End Date Taking? Authorizing Provider  albuterol (VENTOLIN HFA) 108 (90 Base) MCG/ACT inhaler  Inhale 2 puffs into the lungs every 6 (six) hours as needed for wheezing or shortness of breath. 11/30/19   Don Perking, [place], MD  Brinzolamide-Brimonidine 1-0.2 % SUSP Apply 1 drop to eye daily. Into left eye. 11/01/19   Lurene Shadow, MD  Dextran 70-Hypromellose (ARTIFICIAL TEARS) 0.1-0.3 % SOLN Apply 1 drop to eye 4 (four) times daily as needed. In both eyes for dry eyes. 01/26/18   [provider]  hydrochlorothiazide (MICROZIDE) 12.5 MG capsule Take 1 capsule (12.5 mg total) by mouth daily. 12/12/19 01/11/20  Lurene Shadow, MD  HYDROcodone-acetaminophen (NORCO/VICODIN) 5-325 MG tablet Take 1 tablet by mouth every 4 (four) hours as needed for moderate pain. 05/27/20   Cuthriell, Delorise Royals, PA-C  ipratropium-albuterol (DUONEB) 0.5-2.5 (3) MG/3ML SOLN Take 3 mLs by nebulization every 6 (six) hours as needed. 05/07/20   Jacalyn Lefevre, MD  lidocaine (XYLOCAINE) 2 % solution Use as directed 5 mLs in the mouth or throat every 6 (six) hours as needed for mouth pain. For oral swish and swallow. 12/26/19   Joni Reining, PA-C  lisinopril (ZESTRIL) 10 MG tablet Take 1 tablet (10 mg total) by mouth daily. 12/12/19 01/11/20  Lurene Shadow, MD  omeprazole (PRILOSEC) 20 MG capsule Take 1 capsule (20 mg total) by mouth daily. 08/12/12   Salley Scarlet, MD  predniSONE (DELTASONE) 50 MG tablet Take 1 tablet (50 mg total) by mouth daily with breakfast. 05/27/20   Cuthriell, Delorise Royals, PA-C  tamsulosin (FLOMAX) 0.4 MG CAPS capsule Take 0.4 mg by mouth daily with breakfast. Take 30 minutes  after breakfast.    [provider]  Tiotropium Bromide Monohydrate (SPIRIVA RESPIMAT) 2.5 MCG/ACT AERS Inhale 2 puffs into the lungs daily. 05/10/20   Salena Saner, MD     Allergies Patient has no known allergies.  Family History  Problem Relation Age of Onset  . Diabetes Mother   . Hypertension Mother   . Stroke Mother   . Asthma Father   . Diabetes Father   . Diabetes Sister   . Hypertension  Sister   . Diabetes Brother   . Hypertension Brother     Social History Social History   Tobacco Use  . Smoking status: Current Every Day Smoker    Packs/day: 0.50    Years: 40.00    Pack years: 20.00    Types: Cigarettes    Last attempt to quit: 2020    Years since quitting: 1.7  . Smokeless tobacco: Never Used  Vaping Use  . Vaping Use: Never used  Substance Use Topics  . Alcohol use: Yes    Comment: vodka on the weekends  . Drug use: No    Review of Systems limited by patient distress  Constitutional: No fever/chills  ENT: No throat swelling Cardiovascular: Tightness Respiratory: As above Gastrointestinal: no vomiting.    Musculoskeletal: Negative for calf pain    ____________________________________________   PHYSICAL EXAM:  VITAL SIGNS: ED Triage Vitals  Enc Vitals Group     BP 07/25/20 0825 118/86     Pulse Rate 07/25/20 0825 99     Resp 07/25/20 0825 20     Temp 07/25/20 0825 97.8 F (36.6 C)     Temp src --      SpO2 07/25/20 0825 93 %     Weight 07/25/20 0820 77.1 kg (170 lb)     Height 07/25/20 0820 1.689 m (5' 6.5")     Head Circumference --      Peak Flow --      Pain Score 07/25/20 0820 0     Pain Loc --      Pain Edu? --      Excl. in GC? --     Constitutional: Alert and oriented. Sitting up, tripod position, severe increased work of breathing  Nose: No congestion/rhinnorhea. Mouth/Throat: Mucous membranes are moist.    Cardiovascular: Tachycardia, regular rhythm. Grossly normal heart sounds.  Good peripheral circulation. Respiratory: Increased respiratory effort with tachypnea, positive retractions, diffuse wheezing, moderate airflow Gastrointestinal: Soft and nontender. No distention.  Musculoskeletal: No lower extremity tenderness nor edema.  Warm and well perfused Neurologic:  Normal speech and language. No gross focal neurologic deficits are appreciated.  Skin:  Skin is warm, dry and intact. No rash noted. Psychiatric: Mood  and affect are normal. Speech and behavior are normal.  ____________________________________________   LABS (all labs ordered are listed, but only abnormal results are displayed)  Labs Reviewed  COMPREHENSIVE METABOLIC PANEL - Abnormal; Notable for the following components:      Result Value   Glucose, Bld 141 (*)    All other components within normal limits  SARS CORONAVIRUS 2 BY RT PCR (HOSPITAL ORDER, PERFORMED IN [facility])  CBC  BRAIN NATRIURETIC PEPTIDE  TROPONIN I (HIGH SENSITIVITY)   ____________________________________________  EKG  ED ECG REPORT I, Jene Every, the attending physician, personally viewed and interpreted this ECG.  Date: 07/25/2020  Rhythm: normal sinus rhythm QRS Axis: normal Intervals: normal ST/T Wave abnormalities: normal Narrative Interpretation: no evidence of acute ischemia  ____________________________________________  RADIOLOGY  Chest x-ray reviewed by me, no infiltrates, no pneumothorax ____________________________________________   PROCEDURES  Procedure(s) performed: No  Procedures   Critical Care performed: yes  CRITICAL CARE Performed by: Jene Every   Total critical care time: 30 minutes  Critical care time was exclusive of separately billable procedures and treating other patients.  Critical care was necessary to treat or prevent imminent or life-threatening deterioration.  Critical care was time spent personally by me on the following activities: development of treatment plan with patient and/or surrogate as well as nursing, discussions with consultants, evaluation of patient's response to treatment, examination of patient, obtaining history from patient or surrogate, ordering and performing treatments and interventions, ordering and review of laboratory studies, ordering and review of radiographic studies, pulse oximetry and re-evaluation of patient's  condition.  ____________________________________________   INITIAL IMPRESSION / ASSESSMENT AND PLAN / ED COURSE  Pertinent labs & imaging results that were available during my care of the patient were reviewed by me and considered in my medical decision making (see chart for details).  Patient presents in respiratory distress.  Differential includes COPD exacerbation, pneumothorax, COVID-19, bacterial pneumonia, less likely CHF.  Exam is most consistent with COPD exacerbation given diffuse wheezing.  Has apparently had some very mild improvement with EMS therapy, has received IV Solu-Medrol, multiple nebulizers, remains in tripod position with severe work of breathing.  Have called respiratory therapy to start on BiPAP, will give IV magnesium, additional DuoNeb's, pending labs chest x-ray  Chest x-ray reviewed by me, no infiltrate or pneumothorax most consistent with COPD.  Covid swab ordered.  White blood cell count is normal  ----------------------------------------- 9:23 AM on 07/25/2020 -----------------------------------------  Is feeling better on BiPAP, still with significant wheezing on exam, anticipate that he will be able to be switched to nasal cannula after perhaps another hour.  He will require admission to the hospital, will discuss with hospitalist    ____________________________________________   FINAL CLINICAL IMPRESSION(S) / ED DIAGNOSES  Final diagnoses:  COPD exacerbation (HCC)  Acute respiratory failure with hypoxia (HCC)        Note:  This document was prepared using Dragon voice recognition software and may include unintentional dictation errors.   Jene Every, MD 07/25/20 [phone]

## 2020-07-25 NOTE — Plan of Care (Signed)

## 2020-07-25 NOTE — Progress Notes (Signed)
   07/25/20 1315  Clinical Encounter Type  Visited With Patient;Health care provider  Visit Type Initial  Referral From Nurse  Consult/Referral To Chaplain  Chaplain responded to RR. When she arrived at the medical center staff was working on pt. Chaplain asked if anyone was with pt ans she was told no. Pt ws taken to ER. Chaplain stood by silently praying and and after staff seem to have pt a little comfortable, chaplain left.

## 2020-07-25 NOTE — ED Notes (Signed)
Pt requesting to come off Bipap at this time per RT, requesting something to eat, explained will message Dr. Clyde Lundborg regarding trial off of Bipap.

## 2020-07-25 NOTE — ED Notes (Signed)
Per Dr. Clyde Lundborg, okay to trial patient off of Bipap.

## 2020-07-25 NOTE — ED Notes (Signed)
Warm blanket given to pt at this time.

## 2020-07-25 NOTE — ED Notes (Signed)
Pt's sister Aram Beecham updated with patient permission at this time.

## 2020-07-25 NOTE — ED Triage Notes (Signed)
Pt arrived to ED via [facility].  EMS states that pt had severe dyspnea for a few hours and used 4 home treatments with no improvement before calling EMS.   EMS states pt was 93% on RA upon their arrival.   EMS states pt got 125 of solu-medrol and duonebs in route.

## 2020-07-25 NOTE — ED Notes (Signed)
Medications administered as ordered due to patient being off Bipap, pt tolerating being off Bipap well. Pt repositioned in bed at this time per his request. Call bell within reach. Pt denies further needs.

## 2020-07-25 NOTE — ED Notes (Signed)
Jaclynn Guarneri, RN to bedside, pt continues to rest in bed with Bipap on, pt provided with warm blankets to use at his discretion. Call bell remains within reach. VSS at this time.

## 2020-07-25 NOTE — ED Notes (Signed)
Pt visualized in NAD, pt resting in bed with lights turned off. Pt remains on Bipap, awaiting patient to awaken prior to trialing off Bipap at this time.

## 2020-07-25 NOTE — ED Notes (Signed)
Dr. Clyde Lundborg at bedside, this RN spoke with Dr. Clyde Lundborg regarding patient condition. Per Dr. Clyde Lundborg okay to hold PO meds until patient weaned off of Bipap.

## 2020-07-25 NOTE — ED Notes (Signed)
Will attempt to trial patient off of Bipap when patient awakens, pt resting in bed with eyes closed at this time, lights dimmed for comfort.

## 2020-07-25 NOTE — ED Notes (Signed)
Pt given sandwich tray and ginger ale. Denies further needs at this time.

## 2020-07-25 NOTE — ED Notes (Signed)
Respiratory to bedside at this time. Pt placed on bipap at this time.

## 2020-07-25 NOTE — ED Notes (Signed)
Pt resting in bed at this time. Call bell remains within reach of patient at this time. VSS. Pt remains on Bipap at this time.

## 2020-07-25 NOTE — H&P (Signed)
History and Physical    Bryan Kelley JJO:[id number] DOB: [birth year] DOA: 07/25/2020  Referring MD/NP/PA:   PCP: Marya Fossa, PA-C   Patient coming from:  The patient is coming from home.  At baseline, pt is independent for most of ADL.        Chief Complaint: SOB  HPI: Bryan Kelley is a 64 y.o. male with medical history significant of hypertension, COPD, asthma, GERD, depression, anxiety, left eye vision loss, alcohol use, tobacco abuse, BPH, COVID-19 infection, who presents with shortness of breath.  Patient states that his shortness breath started yesterday, which has been progressively worsening.  Patient has cough with dark maroon-colored sputum production. Patient does not have fever or chills.  Patient denies chest pain.  Has nausea, no vomiting, diarrhea or abdominal pain.  No symptoms of UTI or unilateral weakness. His oxygen saturation is 93% on room air in ED.  He cannot speak in full sentence.  He is using accessory muscle for breathing.  BiPAP is started due to severe shortness of breath the ED.   ED Course: pt was found to have WBC 10.5, BNP 34, troponin 10, negative Covid PCR, electrolytes renal function okay, temperature normal, blood pressure 126/77, heart rate 99 -->75, RR 20 -->18, oxygen saturation 93% on room air.  Chest x-ray showed questionable right small pleural effusion versus scarring.  Patient is placed on progressive bed of oxygen.    Review of Systems:   General: no fevers, chills, no body weight gain, has fatigue HEENT: no blurry vision, hearing changes or sore throat Respiratory: has dyspnea, coughing, wheezing CV: no chest pain, no palpitations GI: has nausea, no vomiting, abdominal pain, diarrhea, constipation GU: no dysuria, burning on urination, increased urinary frequency, hematuria  Ext: no leg edema Neuro: no unilateral weakness, numbness, or tingling, no vision change or hearing loss Skin: no rash, no skin tear. MSK: No muscle spasm, no  deformity, no limitation of range of movement in spin Heme: No easy bruising.  Travel history: No recent long distant travel.  Allergy: No Known Allergies  Past Medical History:  Diagnosis Date   Asthma    COPD (chronic obstructive pulmonary disease) (HCC)    Depression    GERD (gastroesophageal reflux disease)    Vision loss, left eye     Past Surgical History:  Procedure Laterality Date   EYE SURGERY  2009, about 1601,0932   steel in left eye on the job, legally blind     Social History:  reports that he has been smoking cigarettes. He has a 20.00 pack-year smoking history. He has never used smokeless tobacco. He reports current alcohol use. He reports that he does not use drugs.  Family History:  Family History  Problem Relation Age of Onset   Diabetes Mother    Hypertension Mother    Stroke Mother    Asthma Father    Diabetes Father    Diabetes Sister    Hypertension Sister    Diabetes Brother    Hypertension Brother      Prior to Admission medications   Medication Sig Start Date End Date Taking? Authorizing Provider  albuterol (VENTOLIN HFA) 108 (90 Base) MCG/ACT inhaler Inhale 2 puffs into the lungs every 6 (six) hours as needed for wheezing or shortness of breath. 11/30/19   Don Perking, [place], MD  Brinzolamide-Brimonidine 1-0.2 % SUSP Apply 1 drop to eye daily. Into left eye. 11/01/19   Lurene Shadow, MD  Dextran 70-Hypromellose (ARTIFICIAL TEARS) 0.1-0.3 % SOLN Apply  1 drop to eye 4 (four) times daily as needed. In both eyes for dry eyes. 01/26/18   [provider]  hydrochlorothiazide (MICROZIDE) 12.5 MG capsule Take 1 capsule (12.5 mg total) by mouth daily. 12/12/19 01/11/20  Lurene Shadow, MD  HYDROcodone-acetaminophen (NORCO/VICODIN) 5-325 MG tablet Take 1 tablet by mouth every 4 (four) hours as needed for moderate pain. 05/27/20   Cuthriell, Delorise Royals, PA-C  ipratropium-albuterol (DUONEB) 0.5-2.5 (3) MG/3ML SOLN Take 3 mLs by nebulization  every 6 (six) hours as needed. 05/07/20   Jacalyn Lefevre, MD  lidocaine (XYLOCAINE) 2 % solution Use as directed 5 mLs in the mouth or throat every 6 (six) hours as needed for mouth pain. For oral swish and swallow. 12/26/19   Joni Reining, PA-C  lisinopril (ZESTRIL) 10 MG tablet Take 1 tablet (10 mg total) by mouth daily. 12/12/19 01/11/20  Lurene Shadow, MD  omeprazole (PRILOSEC) 20 MG capsule Take 1 capsule (20 mg total) by mouth daily. 08/12/12   Salley Scarlet, MD  predniSONE (DELTASONE) 50 MG tablet Take 1 tablet (50 mg total) by mouth daily with breakfast. 05/27/20   Cuthriell, Delorise Royals, PA-C  tamsulosin (FLOMAX) 0.4 MG CAPS capsule Take 0.4 mg by mouth daily with breakfast. Take 30 minutes after breakfast.    [provider]  Tiotropium Bromide Monohydrate (SPIRIVA RESPIMAT) 2.5 MCG/ACT AERS Inhale 2 puffs into the lungs daily. 05/10/20   Salena Saner, MD    Physical Exam: Vitals:   07/25/20 0930 07/25/20 1000 07/25/20 1030 07/25/20 1106  BP: 121/79 126/77 127/75 108/90  Pulse: 77 75 82 84  Resp: 18 18 19    Temp:      SpO2: 98% 100% 99%   Weight:      Height:       General: Not in acute distress HEENT:       Eyes: PERRL, EOMI, no scleral icterus.       ENT: No discharge from the ears and nose, no pharynx injection, no tonsillar enlargement.        Neck: No JVD, no bruit, no mass felt. Heme: No neck lymph node enlargement. Cardiac: S1/S2, RRR, No murmurs, No gallops or rubs. Respiratory: Has diffused wheezing and rhonchi bilaterally. GI: Soft, nondistended, nontender, no rebound pain, no organomegaly, BS present. GU: No hematuria Ext: No pitting leg edema bilaterally. 1+DP/PT pulse bilaterally. Musculoskeletal: No joint deformities, No joint redness or warmth, no limitation of ROM in spin. Skin: No rashes.  Neuro: Alert, oriented X3, cranial nerves II-XII grossly intact, moves all extremities normally.  Psych: Patient is not psychotic, no suicidal or hemocidal  ideation.  Labs on Admission: I have personally reviewed following labs and imaging studies  CBC: Recent Labs  Lab 07/25/20 0827  WBC 10.5  HGB 14.2  HCT 41.3  MCV 91.0  PLT 272   Basic Metabolic Panel: Recent Labs  Lab 07/25/20 0827  NA 138  K 3.5  CL 100  CO2 25  GLUCOSE 141*  BUN 19  CREATININE 0.99  CALCIUM 9.3   GFR: Estimated Creatinine Clearance: 70.2 mL/min (by C-G formula based on SCr of 0.99 mg/dL). Liver Function Tests: Recent Labs  Lab 07/25/20 0827  AST 27  ALT 24  ALKPHOS 78  BILITOT 0.7  PROT 7.6  ALBUMIN 4.2   No results for input(s): LIPASE, AMYLASE in the last 168 hours. No results for input(s): AMMONIA in the last 168 hours. Coagulation Profile: No results for input(s): INR, PROTIME in the last 168  hours. Cardiac Enzymes: No results for input(s): CKTOTAL, CKMB, CKMBINDEX, TROPONINI in the last 168 hours. BNP (last 3 results) No results for input(s): PROBNP in the last 8760 hours. HbA1C: No results for input(s): HGBA1C in the last 72 hours. CBG: No results for input(s): GLUCAP in the last 168 hours. Lipid Profile: No results for input(s): CHOL, HDL, LDLCALC, TRIG, CHOLHDL, LDLDIRECT in the last 72 hours. Thyroid Function Tests: No results for input(s): TSH, T4TOTAL, FREET4, T3FREE, THYROIDAB in the last 72 hours. Anemia Panel: No results for input(s): VITAMINB12, FOLATE, FERRITIN, TIBC, IRON, RETICCTPCT in the last 72 hours. Urine analysis: No results found for: COLORURINE, APPEARANCEUR, LABSPEC, PHURINE, GLUCOSEU, HGBUR, BILIRUBINUR, KETONESUR, PROTEINUR, UROBILINOGEN, NITRITE, LEUKOCYTESUR Sepsis Labs: @LABRCNTIP (procalcitonin:4,lacticidven:4) ) Recent Results (from the past 240 hour(s))  SARS Coronavirus 2 by RT PCR (hospital order, [facility]) Nasopharyngeal Nasopharyngeal Swab     Status: None   Collection Time: 07/25/20  8:28 AM   Specimen: Nasopharyngeal Swab  Result Value Ref Range Status   SARS  Coronavirus 2 NEGATIVE NEGATIVE Final    Comment: (NOTE) SARS-CoV-2 target nucleic acids are NOT DETECTED.  The SARS-CoV-2 RNA is generally detectable in upper and lower respiratory specimens during the acute phase of infection. The lowest concentration of SARS-CoV-2 viral copies this assay can detect is 250 copies / mL. A negative result does not preclude SARS-CoV-2 infection and should not be used as the sole basis for treatment or other patient management decisions.  A negative result may occur with improper specimen collection / handling, submission of specimen other than nasopharyngeal swab, presence of viral mutation(s) within the areas targeted by this assay, and inadequate number of viral copies (<250 copies / mL). A negative result must be combined with clinical observations, patient history, and epidemiological information.  Fact Sheet for Patients:   BoilerBrush.com.cyhttps://www.fda.gov/media/136312/download  Fact Sheet for Healthcare Providers: https://pope.com/https://www.fda.gov/media/136313/download  This test is not yet approved or  cleared by the [place] FDA and has been authorized for detection and/or diagnosis of SARS-CoV-2 by FDA under an Emergency Use Authorization (EUA).  This EUA will remain in effect (meaning this test can be used) for the duration of the COVID-19 declaration under Section 564(b)(1) of the Act, 21 U.S.C. section 360bbb-3(b)(1), unless the authorization is terminated or revoked sooner.  Performed at [facility], [address] [place], [place] [address]      Radiological Exams on Admission: [facility]  Result Date: 07/25/2020 CLINICAL DATA:  Shortness of breath.  History of COPD and asthma. EXAM: PORTABLE CHEST 1 VIEW COMPARISON:  05/27/2020 FINDINGS: The heart size and mediastinal contours are within normal limits. Both lungs are clear. Similar chronic mild hyperinflation. Mild blunting of the right costophrenic sulcus, possibly representing a  small pleural effusion. No pneumothorax. The visualized skeletal structures are unremarkable. Similar small metallic density projecting over the right upper quadrant, likely representing a BB. IMPRESSION: 1. Possible small right pleural effusion versus pleural scarring. Dedicated PA and lateral radiographs could further evaluate, if indicated. 2. Otherwise, no acute cardiopulmonary abnormality. 3. Similar chronic mild hyperinflation. Electronically Signed   By: Feliberto HartsFrederick S Jones MD   On: 07/25/2020 08:42     EKG: Independently reviewed.  Sinus rhythm, QTC 458, bilateral atrial enlargement, nonspecific T wave change.   Assessment/Plan Principal Problem:   COPD exacerbation (HCC) Active Problems:   GERD   BPH (benign prostatic hyperplasia)   Alcohol abuse   Tobacco abuse   HTN (hypertension)  COPD exacerbation [facility]): Patient has productive cough, shortness breath, wheezing rhonchi on auscultation. No infiltrate on chest x-ray, clinically consistent with COPD exacerbation.  Patient received 2 g of magnesium sulfate, currently on BiPAP.  - Will place to progressive unit for observation - continue BiPAP now -Bronchodilators -Solu-Medrol 40 mg IV tid -Z pak  -Mucinex for cough  -Incentive spirometry -Follow up sputum culture -Nasal cannula oxygen as needed to maintain O2 saturation 93% or greater when pt is off BiPAP  BPH (benign prostatic hyperplasia) -not taking Flomax  Tobacco abuse and Alcohol abuse: -Nicotine patch -CIWA protocol  HTN (hypertension): -IV hydralazine as needed -not taking his lisinopril, HCTZ  GERD: -not taking protionix      DVT ppx: SQ Lovenox Code Status: Full code Family Communication: not done, no family member is at bed side.     Disposition Plan:  Anticipate discharge back to previous environment Consults called:  none Admission status:  progressive unit for obs    Status is: Observation  The patient remains OBS appropriate and will d/c  before 2 midnights.  Dispo: The patient is from: Home              Anticipated d/c is to: Home              Anticipated d/c date is: 1 day              Patient currently is not medically stable to d/c.          Date of Service 07/25/2020    Lorretta Harp [facility]   If 7PM-7AM, please contact night-coverage [email or web address] 07/25/2020, 11:14 AM

## 2020-07-25 NOTE — ED Notes (Signed)
Pt states he continues to feel okay off of Bipap, pt visualized resting in bed, remains on 2L via [place] at this time. Call bell remains within reach at this time. Pt remains A&O. Lights dimmed for patient comfort.

## 2020-07-26 DIAGNOSIS — F329 Major depressive disorder, single episode, unspecified: Secondary | ICD-10-CM | POA: Diagnosis present

## 2020-07-26 DIAGNOSIS — K219 Gastro-esophageal reflux disease without esophagitis: Secondary | ICD-10-CM | POA: Diagnosis present

## 2020-07-26 DIAGNOSIS — Z8249 Family history of ischemic heart disease and other diseases of the circulatory system: Secondary | ICD-10-CM | POA: Diagnosis not present

## 2020-07-26 DIAGNOSIS — F419 Anxiety disorder, unspecified: Secondary | ICD-10-CM | POA: Diagnosis present

## 2020-07-26 DIAGNOSIS — I10 Essential (primary) hypertension: Secondary | ICD-10-CM | POA: Diagnosis present

## 2020-07-26 DIAGNOSIS — F1010 Alcohol abuse, uncomplicated: Secondary | ICD-10-CM | POA: Diagnosis present

## 2020-07-26 DIAGNOSIS — Z720 Tobacco use: Secondary | ICD-10-CM | POA: Diagnosis not present

## 2020-07-26 DIAGNOSIS — J441 Chronic obstructive pulmonary disease with (acute) exacerbation: Secondary | ICD-10-CM | POA: Diagnosis present

## 2020-07-26 DIAGNOSIS — Z20822 Contact with and (suspected) exposure to covid-19: Secondary | ICD-10-CM | POA: Diagnosis present

## 2020-07-26 DIAGNOSIS — Z833 Family history of diabetes mellitus: Secondary | ICD-10-CM | POA: Diagnosis not present

## 2020-07-26 DIAGNOSIS — F17210 Nicotine dependence, cigarettes, uncomplicated: Secondary | ICD-10-CM | POA: Diagnosis present

## 2020-07-26 DIAGNOSIS — H548 Legal blindness, as defined in USA: Secondary | ICD-10-CM | POA: Diagnosis present

## 2020-07-26 DIAGNOSIS — Z825 Family history of asthma and other chronic lower respiratory diseases: Secondary | ICD-10-CM | POA: Diagnosis not present

## 2020-07-26 DIAGNOSIS — J9601 Acute respiratory failure with hypoxia: Secondary | ICD-10-CM | POA: Diagnosis present

## 2020-07-26 DIAGNOSIS — R0602 Shortness of breath: Secondary | ICD-10-CM | POA: Diagnosis present

## 2020-07-26 DIAGNOSIS — Z7951 Long term (current) use of inhaled steroids: Secondary | ICD-10-CM | POA: Diagnosis not present

## 2020-07-26 DIAGNOSIS — Z7952 Long term (current) use of systemic steroids: Secondary | ICD-10-CM | POA: Diagnosis not present

## 2020-07-26 DIAGNOSIS — N400 Enlarged prostate without lower urinary tract symptoms: Secondary | ICD-10-CM | POA: Diagnosis present

## 2020-07-26 DIAGNOSIS — Z79899 Other long term (current) drug therapy: Secondary | ICD-10-CM | POA: Diagnosis not present

## 2020-07-26 DIAGNOSIS — Z823 Family history of stroke: Secondary | ICD-10-CM | POA: Diagnosis not present

## 2020-07-26 LAB — CBC
HCT: 42.1 % (ref 39.0–52.0)
Hemoglobin: 14.9 g/dL (ref 13.0–17.0)
MCH: 31.6 pg (ref 26.0–34.0)
MCHC: 35.4 g/dL (ref 30.0–36.0)
MCV: 89.2 fL (ref 80.0–100.0)
Platelets: 276 10*3/uL (ref 150–400)
RBC: 4.72 MIL/uL (ref 4.22–5.81)
RDW: 14.4 % (ref 11.5–15.5)
WBC: 13.9 10*3/uL — ABNORMAL HIGH (ref 4.0–10.5)
nRBC: 0 % (ref 0.0–0.2)

## 2020-07-26 LAB — EXPECTORATED SPUTUM ASSESSMENT W GRAM STAIN, RFLX TO RESP C

## 2020-07-26 LAB — BASIC METABOLIC PANEL
Anion gap: 9 (ref 5–15)
BUN: 17 mg/dL (ref 8–23)
CO2: 28 mmol/L (ref 22–32)
Calcium: 9.2 mg/dL (ref 8.9–10.3)
Chloride: 99 mmol/L (ref 98–111)
Creatinine, Ser: 0.74 mg/dL (ref 0.61–1.24)
GFR calc Af Amer: >60 mL/min (ref >60)
GFR calc non Af Amer: >60 mL/min (ref >60)
Glucose, Bld: 190 mg/dL — ABNORMAL HIGH (ref 70–99)
Potassium: 4.6 mmol/L (ref 3.5–5.1)
Sodium: 136 mmol/L (ref 135–145)

## 2020-07-26 MED ORDER — POLYVINYL ALCOHOL 1.4 % OP SOLN
1.0000 [drp] | OPHTHALMIC | Status: DC | PRN
  Filled 2020-07-26: qty 15

## 2020-07-26 MED ORDER — METHYLPREDNISOLONE SODIUM SUCC 125 MG IJ SOLR
60.0000 mg | Freq: Three times a day (TID) | INTRAMUSCULAR | Status: DC
  Administered 2020-07-26 – 2020-07-28 (×6): 60 mg via INTRAVENOUS
  Filled 2020-07-26 (×6): qty 2

## 2020-07-26 MED ORDER — DM-GUAIFENESIN ER 30-600 MG PO TB12
1.0000 | ORAL_TABLET | Freq: Two times a day (BID) | ORAL | Status: DC
  Administered 2020-07-26 – 2020-07-28 (×4): 1 via ORAL
  Filled 2020-07-26 (×4): qty 1

## 2020-07-26 MED ORDER — ALBUTEROL SULFATE HFA 108 (90 BASE) MCG/ACT IN AERS
2.0000 | INHALATION_SPRAY | RESPIRATORY_TRACT | Status: DC | PRN
  Administered 2020-07-28: 2 via RESPIRATORY_TRACT
  Filled 2020-07-26 (×2): qty 6.7

## 2020-07-26 MED ORDER — IPRATROPIUM-ALBUTEROL 0.5-2.5 (3) MG/3ML IN SOLN
3.0000 mL | Freq: Four times a day (QID) | RESPIRATORY_TRACT | Status: DC
  Administered 2020-07-26 – 2020-07-28 (×8): 3 mL via RESPIRATORY_TRACT
  Filled 2020-07-26 (×9): qty 3

## 2020-07-26 MED ORDER — HYDROCODONE-ACETAMINOPHEN 5-325 MG PO TABS
1.0000 | ORAL_TABLET | Freq: Four times a day (QID) | ORAL | Status: DC | PRN
  Administered 2020-07-26 – 2020-07-27 (×4): 1 via ORAL
  Filled 2020-07-26 (×4): qty 1

## 2020-07-26 NOTE — Hospital Course (Signed)
Bryan Kelley is a 64 y.o. male with medical history significant of hypertension, COPD, asthma, GERD, depression, anxiety, left eye vision loss, alcohol use, tobacco abuse, BPH, COVID-19 infection, who presented to the ED via EMS from home on 07/25/2020 with worsening shortness of breath, productive cough without fever/chills and wheezing.  Oxygen saturation on room air at home reported to be 82%.  Patient was in respiratory distress on presentation, placed on BiPAP.    Admitted to hospitalist service for further management of acute exacerbation of COPD.

## 2020-07-26 NOTE — Plan of Care (Signed)
  Problem: Education: Goal: Knowledge of General Education information will improve Description Including pain rating scale, medication(s)/side effects and non-pharmacologic comfort measures Outcome: Progressing   Problem: Health Behavior/Discharge Planning: Goal: Ability to manage health-related needs will improve Outcome: Progressing   

## 2020-07-26 NOTE — Progress Notes (Addendum)
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 This RN went in to assess patient's pain and he is asleep. Will assess at a later time.   Update: MD put in PRN order for pain. PRN med administered. Patient resting in bed.

## 2020-07-26 NOTE — Progress Notes (Signed)
PROGRESS NOTE    Bryan BirksWalter J Bozzo   [id number]  DOB: August 28, 1956  PCP: Marya FossaLeyva, Teresa, PA-C    DOA: 07/25/2020 LOS: 0   Brief Narrative   Bryan Kelley is a 64 y.o. male with medical history significant of hypertension, COPD, asthma, GERD, depression, anxiety, left eye vision loss, alcohol use, tobacco abuse, BPH, COVID-19 infection, who presented to the ED via EMS from home on 07/25/2020 with worsening shortness of breath, productive cough without fever/chills and wheezing.  Oxygen saturation on room air at home reported to be 82%.  Patient was in respiratory distress on presentation, placed on BiPAP.    Admitted to hospitalist service for further management of acute exacerbation of COPD.         Assessment & Plan   Principal Problem:   COPD exacerbation (HCC) Active Problems:   GERD   BPH (benign prostatic hyperplasia)   Alcohol abuse   COPD with acute exacerbation (HCC)   Tobacco abuse   HTN (hypertension)   Acute Respiratory Failure with Hypoxia secondary to COPD - present on admission with respiratory distress and requiring BiPAP and supplemental oxygen.   --Supplemental O2 as needed, maintain O2 sat > 88%, wean as tolerated --Home oxygen qualification if not weaned off in next day or two  COPD exacerbation - present on admission with respiratory distress, productive cough, wheezing, no infiltrate on CXR, clinically consistent with COPD exacerbation.  Initially required BiPAP on presentation. Today is on 2-3 L/min but continues to be visibly short of breath at rest. --Increase Solu-medrol to 60 mg Q8H --Zithromax --Schedule Duonebs --PRN albuterol nebs or inhaler --Mucinex BID --Incentive spirometry --Follow up sputum culture -Nasal cannula oxygen as neededto maintain O2 saturation 93% or greater when pt is off BiPAP  BPH - not taking Flomax  Tobacco abuse - Nicotine patch.    Alcohol abuse -CIWA protocol  Hypertension - IV hydralazine as needed.  Not  taking his lisinopril, HCTZ  GERD - not taking protionix     DVT prophylaxis: enoxaparin (LOVENOX) injection 40 mg Start: 07/25/20 2200   Diet:  Diet Orders (From admission, onward)    Start     Ordered   07/25/20 1654  Diet Heart Room service appropriate? Yes; Fluid consistency: Thin  Diet effective now       Question Answer Comment  Room service appropriate? Yes   Fluid consistency: Thin   Na restriction, if any: 2 gm Na      07/25/20 1653            Code Status: Full Code    Subjective 07/26/20    Patient seen this AM.  Continues to be very short of breath, conversational dyspnea and some accessory muscle use at rest.  On 2 L/min oxygen this AM and has remained off BiPAP.  Denies fever/chills.  Cough ongoing.   Disposition Plan & Communication   Status is: Inpatient  Remains inpatient appropriate because:IV treatments appropriate due to intensity of illness or inability to take PO   Dispo: The patient is from: Home              Anticipated d/c is to: Home              Anticipated d/c date is: 2 days              Patient currently is not medically stable to d/c.   Family Communication: none at bedside, will attempt to call    Consults, Procedures, Significant Events  Consultants:   None  Procedures:   None  Antimicrobials:  Anti-infectives (From admission, onward)   Start     Dose/Rate Route Frequency Ordered Stop   07/26/20 1000  azithromycin (ZITHROMAX) tablet 250 mg       "Followed by" Linked Group Details   250 mg Oral Daily 07/25/20 1027 07/30/20 0959   07/25/20 1030  azithromycin (ZITHROMAX) tablet 500 mg       "Followed by" Linked Group Details   500 mg Oral Daily 07/25/20 1027 07/25/20 1413        Objective   Vitals:   07/26/20 0400 07/26/20 0500 07/26/20 0833 07/26/20 1029  BP: 137/76  140/84 (!) 146/78  Pulse:   77 90  Resp:   18 18  Temp: 98.3 F (36.8 C)  97.8 F (36.6 C) 98.1 F (36.7 C)  TempSrc: Oral  Oral Oral   SpO2: 99%  99% 97%  Weight:  77.2 kg    Height:        Intake/Output Summary (Last 24 hours) at 07/26/2020 1403 Last data filed at 07/26/2020 1031 Gross per 24 hour  Intake 240 ml  Output 725 ml  Net -485 ml   Filed Weights   07/25/20 2115 07/25/20 2153 07/26/20 0500  Weight: 78.5 kg 77.1 kg 77.2 kg    Physical Exam:  General exam: awake, alert, no acute distress Respiratory system: poor aeration with diffuse inspiratory and expiratory wheezes, intermittent rhonchi, increased respiratory effort with conversational dyspnea and accessory muscle use. Cardiovascular system: normal S1/S2, RRR, no pedal edema.   Gastrointestinal system: soft, NT, ND, +bowel sounds. Central nervous system: A&O x3. no gross focal neurologic deficits, normal speech Skin: dry, intact, normal temperature Psychiatry: normal mood, congruent affect, judgement and insight appear normal  Labs   Data Reviewed: I have personally reviewed following labs and imaging studies  CBC: Recent Labs  Lab 07/25/20 0827 07/26/20 0556  WBC 10.5 13.9*  HGB 14.2 14.9  HCT 41.3 42.1  MCV 91.0 89.2  PLT 272 276   Basic Metabolic Panel: Recent Labs  Lab 07/25/20 0827 07/26/20 0556  NA 138 136  K 3.5 4.6  CL 100 99  CO2 25 28  GLUCOSE 141* 190*  BUN 19 17  CREATININE 0.99 0.74  CALCIUM 9.3 9.2   GFR: Estimated Creatinine Clearance: 86.8 mL/min (by C-G formula based on SCr of 0.74 mg/dL). Liver Function Tests: Recent Labs  Lab 07/25/20 0827  AST 27  ALT 24  ALKPHOS 78  BILITOT 0.7  PROT 7.6  ALBUMIN 4.2   No results for input(s): LIPASE, AMYLASE in the last 168 hours. No results for input(s): AMMONIA in the last 168 hours. Coagulation Profile: No results for input(s): INR, PROTIME in the last 168 hours. Cardiac Enzymes: No results for input(s): CKTOTAL, CKMB, CKMBINDEX, TROPONINI in the last 168 hours. BNP (last 3 results) No results for input(s): PROBNP in the last 8760 hours. HbA1C: No  results for input(s): HGBA1C in the last 72 hours. CBG: No results for input(s): GLUCAP in the last 168 hours. Lipid Profile: No results for input(s): CHOL, HDL, LDLCALC, TRIG, CHOLHDL, LDLDIRECT in the last 72 hours. Thyroid Function Tests: No results for input(s): TSH, T4TOTAL, FREET4, T3FREE, THYROIDAB in the last 72 hours. Anemia Panel: No results for input(s): VITAMINB12, FOLATE, FERRITIN, TIBC, IRON, RETICCTPCT in the last 72 hours. Sepsis Labs: No results for input(s): PROCALCITON, LATICACIDVEN in the last 168 hours.  Recent Results (from the past 240 hour(s))  SARS Coronavirus  2 by RT PCR (hospital order, performed in [facility] hospital lab) Nasopharyngeal Nasopharyngeal Swab     Status: None   Collection Time: 07/25/20  8:28 AM   Specimen: Nasopharyngeal Swab  Result Value Ref Range Status   SARS Coronavirus 2 NEGATIVE NEGATIVE Final    Comment: (NOTE) SARS-CoV-2 target nucleic acids are NOT DETECTED.  The SARS-CoV-2 RNA is generally detectable in upper and lower respiratory specimens during the acute phase of infection. The lowest concentration of SARS-CoV-2 viral copies this assay can detect is 250 copies / mL. A negative result does not preclude SARS-CoV-2 infection and should not be used as the sole basis for treatment or other patient management decisions.  A negative result may occur with improper specimen collection / handling, submission of specimen other than nasopharyngeal swab, presence of viral mutation(s) within the areas targeted by this assay, and inadequate number of viral copies (<250 copies / mL). A negative result must be combined with clinical observations, patient history, and epidemiological information.  Fact Sheet for Patients:   BoilerBrush.com.cy  Fact Sheet for Healthcare Providers: https://pope.com/  This test is not yet approved or  cleared by the [place] FDA and has been authorized  for detection and/or diagnosis of SARS-CoV-2 by FDA under an Emergency Use Authorization (EUA).  This EUA will remain in effect (meaning this test can be used) for the duration of the COVID-19 declaration under Section 564(b)(1) of the Act, 21 U.S.C. section 360bbb-3(b)(1), unless the authorization is terminated or revoked sooner.  Performed at [facility], [address] [place], [place] [address]   Culture, sputum-assessment     Status: None   Collection Time: 07/26/20 10:11 AM   Specimen: Expectorated Sputum  Result Value Ref Range Status   Specimen Description EXPECTORATED SPUTUM  Final   Special Requests NONE  Final   Sputum evaluation   Final    Sputum specimen not acceptable for testing.  Please recollect.   REQUEST FOR RECOLLECT CALLED TO BERENICE ALEJO AT 1136 ON 07/26/2020 [facility]. Performed at [facility], [address] [place], [place] [address]    Report Status 07/26/2020 FINAL  Final      Imaging Studies   DG Chest Port 1 View  Result Date: 07/25/2020 CLINICAL DATA:  Shortness of breath.  History of COPD and asthma. EXAM: PORTABLE CHEST 1 VIEW COMPARISON:  05/27/2020 FINDINGS: The heart size and mediastinal contours are within normal limits. Both lungs are clear. Similar chronic mild hyperinflation. Mild blunting of the right costophrenic sulcus, possibly representing a small pleural effusion. No pneumothorax. The visualized skeletal structures are unremarkable. Similar small metallic density projecting over the right upper quadrant, likely representing a BB. IMPRESSION: 1. Possible small right pleural effusion versus pleural scarring. Dedicated PA and lateral radiographs could further evaluate, if indicated. 2. Otherwise, no acute cardiopulmonary abnormality. 3. Similar chronic mild hyperinflation. Electronically Signed   By: Feliberto Harts MD   On: 07/25/2020 08:42     Medications   Scheduled Meds: . azithromycin  250 mg Oral Daily  .  enoxaparin (LOVENOX) injection  40 mg Subcutaneous Q24H  . folic acid  1 mg Oral Daily  . ipratropium-albuterol  3 mL Nebulization Q6H  . LORazepam  0-4 mg Intravenous Q6H   Followed by  . [START ON 07/27/2020] LORazepam  0-4 mg Intravenous Q12H  . methylPREDNISolone (SOLU-MEDROL) injection  40 mg Intravenous Q12H  . multivitamin with minerals  1 tablet Oral Daily  . nicotine  21 mg  Transdermal Daily  . thiamine  100 mg Oral Daily   Or  . thiamine  100 mg Intravenous Daily   Continuous Infusions:     LOS: 0 days    Time spent: 30 minutes    Pennie Banter, DO [facility]  07/26/2020, 2:03 PM    If 7PM-7AM, please contact night-coverage. How to contact the [facility] Attending or Consulting provider 7A - 7P or covering provider during after hours 7P -7A, for this patient?    1. Check the care team in [facility] and look for a) attending/consulting TRH provider listed and b) the [facility] team listed 2. Log into [email or web address] and use [place]'s universal password to access. If you do not have the password, please contact the hospital operator. 3. Locate the [facility] provider you are looking for under [facility] and page to a number that you can be directly reached. 4. If you still have difficulty reaching the provider, please page the [facility] (Director on Call) for the Hospitalists listed on amion for assistance.

## 2020-07-27 LAB — CBC
HCT: 43.1 % (ref 39.0–52.0)
Hemoglobin: 14.8 g/dL (ref 13.0–17.0)
MCH: 30.8 pg (ref 26.0–34.0)
MCHC: 34.3 g/dL (ref 30.0–36.0)
MCV: 89.8 fL (ref 80.0–100.0)
Platelets: 273 10*3/uL (ref 150–400)
RBC: 4.8 MIL/uL (ref 4.22–5.81)
RDW: 14.3 % (ref 11.5–15.5)
WBC: 16.5 10*3/uL — ABNORMAL HIGH (ref 4.0–10.5)
nRBC: 0 % (ref 0.0–0.2)

## 2020-07-27 LAB — HEMOGLOBIN A1C
Hgb A1c MFr Bld: 6.4 % — ABNORMAL HIGH (ref 4.8–5.6)
Mean Plasma Glucose: 136.98 mg/dL

## 2020-07-27 LAB — GLUCOSE, CAPILLARY
Glucose-Capillary: 181 mg/dL — ABNORMAL HIGH (ref 70–99)
Glucose-Capillary: 246 mg/dL — ABNORMAL HIGH (ref 70–99)
Glucose-Capillary: 147 mg/dL — ABNORMAL HIGH (ref 70–99)

## 2020-07-27 LAB — MAGNESIUM: Magnesium: 2.4 mg/dL (ref 1.7–2.4)

## 2020-07-27 MED ORDER — INSULIN ASPART 100 UNIT/ML ~~LOC~~ SOLN
0.0000 [IU] | Freq: Three times a day (TID) | SUBCUTANEOUS | Status: DC
  Administered 2020-07-27: 3 [IU] via SUBCUTANEOUS
  Administered 2020-07-27 – 2020-07-28 (×3): 2 [IU] via SUBCUTANEOUS
  Filled 2020-07-27 (×4): qty 1

## 2020-07-27 MED ORDER — SODIUM CHLORIDE 0.9% FLUSH
10.0000 mL | Freq: Two times a day (BID) | INTRAVENOUS | Status: DC
  Administered 2020-07-27 – 2020-07-28 (×2): 10 mL via INTRAVENOUS

## 2020-07-27 MED ORDER — DOCUSATE SODIUM 100 MG PO CAPS
100.0000 mg | ORAL_CAPSULE | Freq: Two times a day (BID) | ORAL | Status: DC
  Administered 2020-07-27 – 2020-07-28 (×2): 100 mg via ORAL
  Filled 2020-07-27 (×2): qty 1

## 2020-07-27 MED ORDER — POLYETHYLENE GLYCOL 3350 17 G PO PACK
17.0000 g | PACK | Freq: Every day | ORAL | Status: DC | PRN
  Administered 2020-07-27: 17 g via ORAL
  Filled 2020-07-27: qty 1

## 2020-07-27 NOTE — Progress Notes (Addendum)
PROGRESS NOTE    Bryan Kelley   ZOX:[id number]  DOB: [birth year]  PCP: Marya FossaLeyva, Teresa, PA-C    DOA: 07/25/2020 LOS: 1   Brief Narrative   Bryan Kelley is a 64 y.o. male with medical history significant of hypertension, COPD, asthma, GERD, depression, anxiety, left eye vision loss, alcohol use, tobacco abuse, BPH, COVID-19 infection, who presented to the ED via EMS from home on 07/25/2020 with worsening shortness of breath, productive cough without fever/chills and wheezing.  Oxygen saturation on room air at home reported to be 82%.  Patient was in respiratory distress on presentation, placed on BiPAP.    Admitted to hospitalist service for further management of acute exacerbation of COPD.         Assessment & Plan   Principal Problem:   COPD exacerbation (HCC) Active Problems:   GERD   BPH (benign prostatic hyperplasia)   Alcohol abuse   COPD with acute exacerbation (HCC)   Tobacco abuse   HTN (hypertension)   Acute Respiratory Failure with Hypoxia secondary to COPD - present on admission with respiratory distress and requiring BiPAP and supplemental oxygen.   --Supplemental O2 as needed, maintain O2 sat > 88%, wean as tolerated --Home oxygen qualification   COPD exacerbation - present on admission with respiratory distress, productive cough, wheezing, no infiltrate on CXR, clinically consistent with COPD exacerbation.  Initially required BiPAP on presentation. 9/23: on 2-3 L/min but continues to be visibly short of breath at rest. 9/24: still diffuse wheezing, SOB (little better), still poor aeration. --Continue Solu-medrol to 60 mg Q8H --Zithromax --Schedule Duonebs --PRN albuterol nebs or inhaler --Mucinex BID --Incentive spirometry  BPH - not taking Flomax  Tobacco abuse - Nicotine patch.    Alcohol abuse - started on CIWA protocol - scores have been zero.  D/C CIWA and PRN Ativan.  Hypertension - IV hydralazine as needed.  Not taking his lisinopril,  HCTZ  GERD - not taking protionix     DVT prophylaxis: enoxaparin (LOVENOX) injection 40 mg Start: 07/25/20 2200   Diet:  Diet Orders (From admission, onward)    Start     Ordered   07/25/20 1654  Diet Heart Room service appropriate? Yes; Fluid consistency: Thin  Diet effective now       Question Answer Comment  Room service appropriate? Yes   Fluid consistency: Thin   Na restriction, if any: 2 gm Na      07/25/20 1653            Code Status: Full Code    Subjective 07/27/20    Patient seen this AM.  He reports his shortness of breath "fluctuates", still significantly SOB with any exertion.  No fever/chills.  No other acute complaints.     Disposition Plan & Communication   Status is: Inpatient  Remains inpatient appropriate because:IV treatments appropriate due to intensity of illness or inability to take PO.    Dispo: The patient is from: Home              Anticipated d/c is to: Home              Anticipated d/c date is: 2 days              Patient currently is not medically stable to d/c.   Family Communication: none at bedside, will attempt to call    Consults, Procedures, Significant Events   Consultants:   None  Procedures:   None  Antimicrobials:  Anti-infectives (  From admission, onward)   Start     Dose/Rate Route Frequency Ordered Stop   07/26/20 1000  azithromycin (ZITHROMAX) tablet 250 mg       "Followed by" Linked Group Details   250 mg Oral Daily 07/25/20 1027 07/30/20 0959   07/25/20 1030  azithromycin (ZITHROMAX) tablet 500 mg       "Followed by" Linked Group Details   500 mg Oral Daily 07/25/20 1027 07/25/20 1413        Objective   Vitals:   07/27/20 0742 07/27/20 0847 07/27/20 1204 07/27/20 1448  BP: 137/86  (!) 134/95 (!) 145/88  Pulse: 65 65 66 68  Resp: 17 16 18    Temp: 97.9 F (36.6 C)  (!) 97.5 F (36.4 C) 97.6 F (36.4 C)  TempSrc: Oral  Oral Oral  SpO2: 98% 95% 100% 98%  Weight:      Height:         Intake/Output Summary (Last 24 hours) at 07/27/2020 1614 Last data filed at 07/27/2020 1026 Gross per 24 hour  Intake 480 ml  Output --  Net 480 ml   Filed Weights   07/25/20 2115 07/25/20 2153 07/26/20 0500  Weight: 78.5 kg 77.1 kg 77.2 kg    Physical Exam:  General exam: awake, alert, no acute distress Respiratory system: diffuse wheezing, intermittent rhonchi, poor aeration. Cardiovascular system: normal S1/S2, RRR, no pedal edema.   Central nervous system: A&O x3. no gross focal neurologic deficits, normal speech Psychiatry: normal mood, congruent affect, judgement and insight appear normal  Labs   Data Reviewed: I have personally reviewed following labs and imaging studies  CBC: Recent Labs  Lab 07/25/20 0827 07/26/20 0556 07/27/20 0550  WBC 10.5 13.9* 16.5*  HGB 14.2 14.9 14.8  HCT 41.3 42.1 43.1  MCV 91.0 89.2 89.8  PLT 272 276 273   Basic Metabolic Panel: Recent Labs  Lab 07/25/20 0827 07/26/20 0556 07/27/20 0550  NA 138 136  --   K 3.5 4.6  --   CL 100 99  --   CO2 25 28  --   GLUCOSE 141* 190*  --   BUN 19 17  --   CREATININE 0.99 0.74  --   CALCIUM 9.3 9.2  --   MG  --   --  2.4   GFR: Estimated Creatinine Clearance: 86.8 mL/min (by C-G formula based on SCr of 0.74 mg/dL). Liver Function Tests: Recent Labs  Lab 07/25/20 0827  AST 27  ALT 24  ALKPHOS 78  BILITOT 0.7  PROT 7.6  ALBUMIN 4.2   No results for input(s): LIPASE, AMYLASE in the last 168 hours. No results for input(s): AMMONIA in the last 168 hours. Coagulation Profile: No results for input(s): INR, PROTIME in the last 168 hours. Cardiac Enzymes: No results for input(s): CKTOTAL, CKMB, CKMBINDEX, TROPONINI in the last 168 hours. BNP (last 3 results) No results for input(s): PROBNP in the last 8760 hours. HbA1C: Recent Labs    07/27/20 0550  HGBA1C 6.4*   CBG: Recent Labs  Lab 07/27/20 1207  GLUCAP 181*   Lipid Profile: No results for input(s): CHOL, HDL,  LDLCALC, TRIG, CHOLHDL, LDLDIRECT in the last 72 hours. Thyroid Function Tests: No results for input(s): TSH, T4TOTAL, FREET4, T3FREE, THYROIDAB in the last 72 hours. Anemia Panel: No results for input(s): VITAMINB12, FOLATE, FERRITIN, TIBC, IRON, RETICCTPCT in the last 72 hours. Sepsis Labs: No results for input(s): PROCALCITON, LATICACIDVEN in the last 168 hours.  Recent Results (from the  past 240 hour(s))  SARS Coronavirus 2 by RT PCR (hospital order, performed in [facility] hospital lab) Nasopharyngeal Nasopharyngeal Swab     Status: None   Collection Time: 07/25/20  8:28 AM   Specimen: Nasopharyngeal Swab  Result Value Ref Range Status   SARS Coronavirus 2 NEGATIVE NEGATIVE Final    Comment: (NOTE) SARS-CoV-2 target nucleic acids are NOT DETECTED.  The SARS-CoV-2 RNA is generally detectable in upper and lower respiratory specimens during the acute phase of infection. The lowest concentration of SARS-CoV-2 viral copies this assay can detect is 250 copies / mL. A negative result does not preclude SARS-CoV-2 infection and should not be used as the sole basis for treatment or other patient management decisions.  A negative result may occur with improper specimen collection / handling, submission of specimen other than nasopharyngeal swab, presence of viral mutation(s) within the areas targeted by this assay, and inadequate number of viral copies (<250 copies / mL). A negative result must be combined with clinical observations, patient history, and epidemiological information.  Fact Sheet for Patients:   BoilerBrush.com.cy  Fact Sheet for Healthcare Providers: https://pope.com/  This test is not yet approved or  cleared by the [place] FDA and has been authorized for detection and/or diagnosis of SARS-CoV-2 by FDA under an Emergency Use Authorization (EUA).  This EUA will remain in effect (meaning this test can be used) for  the duration of the COVID-19 declaration under Section 564(b)(1) of the Act, 21 U.S.C. section 360bbb-3(b)(1), unless the authorization is terminated or revoked sooner.  Performed at [facility], [address] [place], [place] [address]   Culture, sputum-assessment     Status: None   Collection Time: 07/26/20 10:11 AM   Specimen: Expectorated Sputum  Result Value Ref Range Status   Specimen Description EXPECTORATED SPUTUM  Final   Special Requests NONE  Final   Sputum evaluation   Final    Sputum specimen not acceptable for testing.  Please recollect.   REQUEST FOR RECOLLECT CALLED TO BERENICE ALEJO AT 1136 ON 07/26/2020 [facility]. Performed at [facility], [address], [place], [place] [address]    Report Status 07/26/2020 FINAL  Final      Imaging Studies   No results found.   Medications   Scheduled Meds: . azithromycin  250 mg Oral Daily  . dextromethorphan-guaiFENesin  1 tablet Oral BID  . enoxaparin (LOVENOX) injection  40 mg Subcutaneous Q24H  . folic acid  1 mg Oral Daily  . insulin aspart  0-9 Units Subcutaneous TID WC  . ipratropium-albuterol  3 mL Nebulization Q6H  . LORazepam  0-4 mg Intravenous Q12H  . methylPREDNISolone (SOLU-MEDROL) injection  60 mg Intravenous Q8H  . multivitamin with minerals  1 tablet Oral Daily  . nicotine  21 mg Transdermal Daily  . thiamine  100 mg Oral Daily   Or  . thiamine  100 mg Intravenous Daily   Continuous Infusions:     LOS: 1 day    Time spent: 25 minutes with > 50% spent in coordination of care and direct patient contact.    Pennie Banter, DO [facility]  07/27/2020, 4:14 PM    If 7PM-7AM, please contact night-coverage. How to contact the [facility] Attending or Consulting provider 7A - 7P or covering provider during after hours 7P -7A, for this patient?    1. Check the care team in [facility] and look for a) attending/consulting TRH provider listed and b) the [facility] team listed  2. Log into  [email or web address] and use [place]'s universal password to access. If you do not have the password, please contact the hospital operator. 3. Locate the [facility] provider you are looking for under [facility] and page to a number that you can be directly reached. 4. If you still have difficulty reaching the provider, please page the [facility] (Director on Call) for the Hospitalists listed on amion for assistance.

## 2020-07-28 LAB — GLUCOSE, CAPILLARY
Glucose-Capillary: 173 mg/dL — ABNORMAL HIGH (ref 70–99)
Glucose-Capillary: 155 mg/dL — ABNORMAL HIGH (ref 70–99)

## 2020-07-28 LAB — CBC
HCT: 44.7 % (ref 39.0–52.0)
Hemoglobin: 14.9 g/dL (ref 13.0–17.0)
MCH: 31.1 pg (ref 26.0–34.0)
MCHC: 33.3 g/dL (ref 30.0–36.0)
MCV: 93.3 fL (ref 80.0–100.0)
Platelets: 266 10*3/uL (ref 150–400)
RBC: 4.79 MIL/uL (ref 4.22–5.81)
RDW: 14.1 % (ref 11.5–15.5)
WBC: 14.6 10*3/uL — ABNORMAL HIGH (ref 4.0–10.5)
nRBC: 0 % (ref 0.0–0.2)

## 2020-07-28 MED ORDER — DM-GUAIFENESIN ER 30-600 MG PO TB12: 1.0000 | ORAL_TABLET | Freq: Two times a day (BID) | ORAL | 0 refills | Status: AC

## 2020-07-28 MED ORDER — NICOTINE 21 MG/24HR TD PT24: 21.0000 mg | MEDICATED_PATCH | Freq: Every day | TRANSDERMAL | 0 refills | Status: DC

## 2020-07-28 MED ORDER — GUAIFENESIN-CODEINE 100-10 MG/5ML PO SOLN: 10.0000 mL | Freq: Three times a day (TID) | ORAL | 0 refills | Status: DC | PRN

## 2020-07-28 MED ORDER — AZITHROMYCIN 500 MG PO TABS: 500.0000 mg | ORAL_TABLET | Freq: Every day | ORAL | 0 refills | Status: AC

## 2020-07-28 MED ORDER — PREDNISONE 10 MG PO TABS: ORAL_TABLET | ORAL | 0 refills | Status: AC

## 2020-07-28 NOTE — Discharge Summary (Signed)
Physician Discharge Summary  Bryan Kelley ZOX:[id number] DOB: 12/26/1955 DOA: 07/25/2020  PCP: Marya Fossa, PA-C  Admit date: 07/25/2020 Discharge date: 07/28/2020  Admitted From: home Disposition:  home  Recommendations for Outpatient Follow-up:  1. Follow up with PCP in 1-2 weeks 2. Please obtain BMP/CBC in one week 3. Please follow up with pulmonology, referred to Dr. Karna Christmas  Home Health: No  Equipment/Devices: None   Discharge Condition: Stable  CODE STATUS: Full  Diet recommendation: Heart Healthy     Discharge Diagnoses: Principal Problem:   COPD exacerbation (HCC) Active Problems:   GERD   BPH (benign prostatic hyperplasia)   Alcohol abuse   COPD with acute exacerbation (HCC)   Tobacco abuse   HTN (hypertension)    Summary of HPI and Hospital Course:  Bryan Kelley is a 64 y.o. male with medical history significant of hypertension, COPD, asthma, GERD, depression, anxiety, left eye vision loss, alcohol use, tobacco abuse, BPH, COVID-19 infection, who presented to the ED via EMS from home on 07/25/2020 with worsening shortness of breath, productive cough without fever/chills and wheezing.  Oxygen saturation on room air at home reported to be 82%.  Patient was in respiratory distress on presentation, placed on BiPAP.    Admitted to hospitalist service for further management of acute exacerbation of COPD.         Acute Respiratory Failure with Hypoxia secondary to COPD - present on admission with respiratory distress and requiring BiPAP and supplemental oxygen.   --Supplemental O2 as needed, maintain O2 sat > 88%, wean as tolerated --Qualified for home oxygen which was obtained prior to discharge  COPD exacerbation - present on admission with respiratory distress, productive cough, wheezing, no infiltrate on CXR,clinically consistent with COPD exacerbation. Initially required BiPAP on presentation. 9/23: on 2-3 L/min but continues to be visibly short of breath  at rest. 9/24: still diffuse wheezing, SOB (little better), still poor aeration. Treated with IV Solu-medrol to 60 mg Q8H and later transitioned to prednisone.   Prednisone taper for discharge: 2 days each - 60-40-30-20-10 mg One more day of Zithromax after discharge Treated with scheduled Duonebs, PRN albuterol nebs or inhaler, Mucinex BID, Incentive spirometry Resume prior COPD regimen Dulera, Spiriva, Albuterol  BPH - not takingFlomax  Tobacco abuse - Nicotine patch.    Alcohol abuse - started on CIWA protocol - scores have been zero.  D/C CIWA and PRN Ativan.  Hypertension - IV hydralazine as needed.  Not taking hislisinopril, HCTZ  GERD - not takingprotionix   Discharge Instructions   Discharge Instructions    Call MD for:   Complete by: As directed    Worsening shortness of breath, wheezing or chest tightness.  Having to increase the amount of oxygen to use to keep your oxygen saturation above 88-90%.   Call MD for:  extreme fatigue   Complete by: As directed    Call MD for:  persistant dizziness or light-headedness   Complete by: As directed    Call MD for:  persistant nausea and vomiting   Complete by: As directed    Discharge instructions   Complete by: As directed    Use supplemental oxygen as needed to keep your oxygen saturation should be between 88-93%.    Take prednisone taper as prescribed.  Your breathing has improved, but since you still get pretty short of breath with walking, I extended the taper longer than we had talked about this morning.  You will do each dose for 2 days (  instead of 1).    Please contact Dr. Terence Lux (pulmonology) office on Monday to request an appointment.  Contact information included in this packet.    Please get established with a primary care doctor for ongoing follow up as well.   Increase activity slowly   Complete by: As directed    Increase activity slowly   Complete by: As directed      Allergies as of  07/28/2020   No Known Allergies     Medication List    TAKE these medications   albuterol 108 (90 Base) MCG/ACT inhaler Commonly known as: VENTOLIN HFA Inhale 2 puffs into the lungs every 6 (six) hours as needed for wheezing or shortness of breath.   albuterol (2.5 MG/3ML) 0.083% nebulizer solution Commonly known as: PROVENTIL Take 2.5 mg by nebulization every 4 (four) hours as needed for wheezing.   azithromycin 500 MG tablet Commonly known as: Zithromax Take 1 tablet (500 mg total) by mouth daily for 1 day. Take 1 tablet daily for 3 days.   dextromethorphan-guaiFENesin 30-600 MG 12hr tablet Commonly known as: MUCINEX DM Take 1 tablet by mouth 2 (two) times daily for 7 days.   Dulera 200-5 MCG/ACT Aero Generic drug: mometasone-formoterol Inhale 2 puffs into the lungs 2 (two) times daily.   guaiFENesin-codeine 100-10 MG/5ML syrup Take 10 mLs by mouth 3 (three) times daily as needed (nighttime cough).   nicotine 21 mg/24hr patch Commonly known as: NICODERM CQ - dosed in mg/24 hours Place 1 patch (21 mg total) onto the skin daily. Start taking on: July 29, 2020   predniSONE 10 MG tablet Commonly known as: DELTASONE Take 6 tablets (60 mg total) by mouth daily with breakfast for 2 days, THEN 4 tablets (40 mg total) daily with breakfast for 2 days, THEN 3 tablets (30 mg total) daily with breakfast for 2 days, THEN 2 tablets (20 mg total) daily with breakfast for 2 days, THEN 1 tablet (10 mg total) daily with breakfast for 2 days. Start taking on: July 28, 2020   Spiriva Respimat 2.5 MCG/ACT Aers Generic drug: Tiotropium Bromide Monohydrate Inhale 2 puffs into the lungs daily.   Viagra 50 MG tablet Generic drug: sildenafil Take 50 mg by mouth as needed for erectile dysfunction.            Durable Medical Equipment  (From admission, onward)         Start     Ordered   07/28/20 1419  For home use only DME oxygen  Once       Question Answer Comment  Length  of Need Lifetime   Mode or (Route) Nasal cannula   Liters per Minute 2   Frequency Continuous (stationary and portable oxygen unit needed)   Oxygen delivery system Gas      07/28/20 1418          Follow-up Information    Vida Rigger, MD. Call.   Specialty: Pulmonary Disease Contact information: [address] [place] [address] [phone]              No Known Allergies  Consultations:  None    Procedures/Studies: DG Chest Port 1 View  Result Date: 07/25/2020 CLINICAL DATA:  Shortness of breath.  History of COPD and asthma. EXAM: PORTABLE CHEST 1 VIEW COMPARISON:  05/27/2020 FINDINGS: The heart size and mediastinal contours are within normal limits. Both lungs are clear. Similar chronic mild hyperinflation. Mild blunting of the right costophrenic sulcus, possibly representing a small pleural  effusion. No pneumothorax. The visualized skeletal structures are unremarkable. Similar small metallic density projecting over the right upper quadrant, likely representing a BB. IMPRESSION: 1. Possible small right pleural effusion versus pleural scarring. Dedicated PA and lateral radiographs could further evaluate, if indicated. 2. Otherwise, no acute cardiopulmonary abnormality. 3. Similar chronic mild hyperinflation. Electronically Signed   By: Feliberto Harts MD   On: 07/25/2020 08:42       Subjective: Patient seen today at bedside.  Reports feeling better.  Still very SOB on exertion which sounds like baseline.  He and s/o at bedside report he desats on exertion at home.  Does not currently have oxygen but feel he needs it.  No fever or chills.     Discharge Exam: Vitals:   07/28/20 0329 07/28/20 0804  BP: (!) 160/89 (!) 156/88  Pulse: 75 72  Resp: 20 18  Temp: 97.8 F (36.6 C) 97.7 F (36.5 C)  SpO2: 97% 95%   Vitals:   07/27/20 2158 07/28/20 0231 07/28/20 0329 07/28/20 0804  BP:   (!) 160/89 (!) 156/88  Pulse:   75 72  Resp:   20 18  Temp:    97.8 F (36.6 C) 97.7 F (36.5 C)  TempSrc:   Oral Oral  SpO2: 98% 98% 97% 95%  Weight:      Height:        General: Pt is alert, awake, not in acute distress Cardiovascular: RRR, S1/S2 +, no rubs, no gallops Respiratory: somewhat improved aeration but still diminished, currently no wheezing, no rhonchi Abdominal: Soft, NT, ND, bowel sounds + Extremities: no edema, no cyanosis    The results of significant diagnostics from this hospitalization (including imaging, microbiology, ancillary and laboratory) are listed below for reference.     Microbiology: Recent Results (from the past 240 hour(s))  SARS Coronavirus 2 by RT PCR (hospital order, performed in [facility] hospital lab) Nasopharyngeal Nasopharyngeal Swab     Status: None   Collection Time: 07/25/20  8:28 AM   Specimen: Nasopharyngeal Swab  Result Value Ref Range Status   SARS Coronavirus 2 NEGATIVE NEGATIVE Final    Comment: (NOTE) SARS-CoV-2 target nucleic acids are NOT DETECTED.  The SARS-CoV-2 RNA is generally detectable in upper and lower respiratory specimens during the acute phase of infection. The lowest concentration of SARS-CoV-2 viral copies this assay can detect is 250 copies / mL. A negative result does not preclude SARS-CoV-2 infection and should not be used as the sole basis for treatment or other patient management decisions.  A negative result may occur with improper specimen collection / handling, submission of specimen other than nasopharyngeal swab, presence of viral mutation(s) within the areas targeted by this assay, and inadequate number of viral copies (<250 copies / mL). A negative result must be combined with clinical observations, patient history, and epidemiological information.  Fact Sheet for Patients:   BoilerBrush.com.cy  Fact Sheet for Healthcare Providers: https://pope.com/  This test is not yet approved or  cleared by the [place] FDA and has been authorized for detection and/or diagnosis of SARS-CoV-2 by FDA under an Emergency Use Authorization (EUA).  This EUA will remain in effect (meaning this test can be used) for the duration of the COVID-19 declaration under Section 564(b)(1) of the Act, 21 U.S.C. section 360bbb-3(b)(1), unless the authorization is terminated or revoked sooner.  Performed at [facility], [address], [place], [place] [address]   Culture, sputum-assessment     Status: None  Collection Time: 07/26/20 10:11 AM   Specimen: Expectorated Sputum  Result Value Ref Range Status   Specimen Description EXPECTORATED SPUTUM  Final   Special Requests NONE  Final   Sputum evaluation   Final    Sputum specimen not acceptable for testing.  Please recollect.   REQUEST FOR RECOLLECT CALLED TO BERENICE ALEJO AT 1136 ON 07/26/2020 [facility]. Performed at [facility], [address] [place], [place] [address]    Report Status 07/26/2020 FINAL  Final     Labs: BNP (last 3 results) Recent Labs    10/29/19 1717 12/06/19 1724 07/25/20 0827  BNP 23.0 18.0 34.2   Basic Metabolic Panel: Recent Labs  Lab 07/25/20 0827 07/26/20 0556 07/27/20 0550  NA 138 136  --   K 3.5 4.6  --   CL 100 99  --   CO2 25 28  --   GLUCOSE 141* 190*  --   BUN 19 17  --   CREATININE 0.99 0.74  --   CALCIUM 9.3 9.2  --   MG  --   --  2.4   Liver Function Tests: Recent Labs  Lab 07/25/20 0827  AST 27  ALT 24  ALKPHOS 78  BILITOT 0.7  PROT 7.6  ALBUMIN 4.2   No results for input(s): LIPASE, AMYLASE in the last 168 hours. No results for input(s): AMMONIA in the last 168 hours. CBC: Recent Labs  Lab 07/25/20 0827 07/26/20 0556 07/27/20 0550 07/28/20 0557  WBC 10.5 13.9* 16.5* 14.6*  HGB 14.2 14.9 14.8 14.9  HCT 41.3 42.1 43.1 44.7  MCV 91.0 89.2 89.8 93.3  PLT 272 276 273 266   Cardiac Enzymes: No results for input(s): CKTOTAL, CKMB, CKMBINDEX, TROPONINI in the last 168  hours. BNP: Invalid input(s): POCBNP CBG: Recent Labs  Lab 07/27/20 1207 07/27/20 1733 07/27/20 2053 07/28/20 0805 07/28/20 1220  GLUCAP 181* 246* 147* 173* 155*   D-Dimer No results for input(s): DDIMER in the last 72 hours. Hgb A1c Recent Labs    07/27/20 0550  HGBA1C 6.4*   Lipid Profile No results for input(s): CHOL, HDL, LDLCALC, TRIG, CHOLHDL, LDLDIRECT in the last 72 hours. Thyroid function studies No results for input(s): TSH, T4TOTAL, T3FREE, THYROIDAB in the last 72 hours.  Invalid input(s): FREET3 Anemia work up No results for input(s): VITAMINB12, FOLATE, FERRITIN, TIBC, IRON, RETICCTPCT in the last 72 hours. Urinalysis No results found for: COLORURINE, APPEARANCEUR, LABSPEC, PHURINE, GLUCOSEU, HGBUR, BILIRUBINUR, KETONESUR, PROTEINUR, UROBILINOGEN, NITRITE, LEUKOCYTESUR Sepsis Labs Invalid input(s): PROCALCITONIN,  WBC,  LACTICIDVEN Microbiology Recent Results (from the past 240 hour(s))  SARS Coronavirus 2 by RT PCR (hospital order, performed in [facility] hospital lab) Nasopharyngeal Nasopharyngeal Swab     Status: None   Collection Time: 07/25/20  8:28 AM   Specimen: Nasopharyngeal Swab  Result Value Ref Range Status   SARS Coronavirus 2 NEGATIVE NEGATIVE Final    Comment: (NOTE) SARS-CoV-2 target nucleic acids are NOT DETECTED.  The SARS-CoV-2 RNA is generally detectable in upper and lower respiratory specimens during the acute phase of infection. The lowest concentration of SARS-CoV-2 viral copies this assay can detect is 250 copies / mL. A negative result does not preclude SARS-CoV-2 infection and should not be used as the sole basis for treatment or other patient management decisions.  A negative result may occur with improper specimen collection / handling, submission of specimen other than nasopharyngeal swab, presence of viral mutation(s) within the areas targeted by this assay, and inadequate number  of viral copies (<250 copies / mL). A  negative result must be combined with clinical observations, patient history, and epidemiological information.  Fact Sheet for Patients:   BoilerBrush.com.cyhttps://www.fda.gov/media/136312/download  Fact Sheet for Healthcare Providers: https://pope.com/https://www.fda.gov/media/136313/download  This test is not yet approved or  cleared by the [place] FDA and has been authorized for detection and/or diagnosis of SARS-CoV-2 by FDA under an Emergency Use Authorization (EUA).  This EUA will remain in effect (meaning this test can be used) for the duration of the COVID-19 declaration under Section 564(b)(1) of the Act, 21 U.S.C. section 360bbb-3(b)(1), unless the authorization is terminated or revoked sooner.  Performed at [facility], [address] [place], [place] [address]   Culture, sputum-assessment     Status: None   Collection Time: 07/26/20 10:11 AM   Specimen: Expectorated Sputum  Result Value Ref Range Status   Specimen Description EXPECTORATED SPUTUM  Final   Special Requests NONE  Final   Sputum evaluation   Final    Sputum specimen not acceptable for testing.  Please recollect.   REQUEST FOR RECOLLECT CALLED TO BERENICE ALEJO AT 1136 ON 07/26/2020 [facility]. Performed at [facility], [address] [place], [place] [address]    Report Status 07/26/2020 FINAL  Final     Time coordinating discharge: Over 30 minutes  SIGNED:   Pennie BanterKelly A Alejandro Adcox, DO [facility] 07/28/2020, 3:36 PM   If 7PM-7AM, please contact night-coverage [email or web address]

## 2020-07-28 NOTE — Progress Notes (Addendum)
SATURATION QUALIFICATIONS: (This note is used to comply with regulatory documentation for home oxygen)  Patient Saturations on Room Air at Rest = 95%  Patient Saturations on Room Air while Ambulating = 89%  Patient Saturations on 2 Liters of oxygen while Ambulating = 92%  

## 2020-07-28 NOTE — Progress Notes (Signed)
Discussed discharge instructions with patient and wife including medication, follow up appointments

## 2020-07-28 NOTE — TOC Progression Note (Signed)
Transition of Care [facility]) - Progression Note    Patient Details  Name: Bryan Kelley MRN: [id number] Date of Birth: [birth year]  Transition of Care [facility]) CM/SW Contact  Dominic Pea, LCSW Phone Number: 07/28/2020, 4:17 PM  Clinical Narrative:    LCSW spoke with Vaughan Basta at [facility], to confirm delivery of an O2 tank for home use to the room for patient's discharge. No other TOC needs.        Expected Discharge Plan and Services           Expected Discharge Date: 07/28/20                                     Social Determinants of Health (SDOH) Interventions    Readmission Risk Interventions No flowsheet data found.

## 2020-07-30 ENCOUNTER — Other Ambulatory Visit: Payer: Self-pay

## 2020-07-30 ENCOUNTER — Encounter: Payer: Self-pay | Admitting: Primary Care

## 2020-07-30 ENCOUNTER — Ambulatory Visit (INDEPENDENT_AMBULATORY_CARE_PROVIDER_SITE_OTHER): Payer: Medicaid Other | Admitting: Primary Care

## 2020-07-30 DIAGNOSIS — J9621 Acute and chronic respiratory failure with hypoxia: Secondary | ICD-10-CM | POA: Diagnosis not present

## 2020-07-30 DIAGNOSIS — J441 Chronic obstructive pulmonary disease with (acute) exacerbation: Secondary | ICD-10-CM | POA: Diagnosis not present

## 2020-07-30 MED ORDER — SPIRIVA RESPIMAT 2.5 MCG/ACT IN AERS: 2.0000 | INHALATION_SPRAY | Freq: Every day | RESPIRATORY_TRACT | 0 refills | Status: DC

## 2020-07-30 MED ORDER — SPIRIVA RESPIMAT 2.5 MCG/ACT IN AERS: 2.0000 | INHALATION_SPRAY | Freq: Every day | RESPIRATORY_TRACT | 11 refills | Status: DC

## 2020-07-30 NOTE — Progress Notes (Signed)
@Patient  ID: , male    DOB: 1955-11-16, 64 y.o.   MRN: [id number]  Chief Complaint  Patient presents with  . Follow-up    Patient states that despite his recent hospitalization his COPD is doing well. Patient c/o cough which produces brown mucus. SOB with exertion.     Referring provider: [id number]  HPI: 64 year old male, former smoker (20+ pack-year history).  Past medical history significant for severe COPD, Covid 19, chronic respiratory failure, hypertension, GERD, BPH, anxiety, elevated BP, hyperglycemia, alcohol use, tobacco abuse.  Patient of Dr. 64, for initial consult on 05/10/2020. Treated for acute exacerbation COPD with Depo-Medrol 120 IM, Clarithromycin 500 mg twice daily x7 days and prednisone taper. Maintained on Symbicort 2 puffs twice daily. Added Spiriva Respimat 2.87mcg.  07/30/2020 Patient presents today for 2 to 66-month follow-up.  He was recently admitted to [facility] on 07/25/20 for COPD exacerbation. Oxygen saturation on room air were reported to be 82%. He was placed on BIPAP. No infiltrate seen on chest xray. He was treated with zithromax, solu-medrol 60mg  q 8 hours. He qualified for home oxygen. Today, he is doing better. He was discharged from hospital 2 days ago. Currently on prednisone taper. He has a chronic cough with clear-brown mucus. He ran out of Spiriva sample. Very rarely required his rescue inhaler. Denies fever, chills, sweats, chest tightness, wheezing or hemoptysis.   No Known Allergies   There is no immunization history on file for this patient.  Past Medical History:  Diagnosis Date  . Asthma   . COPD (chronic obstructive pulmonary disease) (HCC)   . Depression   . GERD (gastroesophageal reflux disease)   . Vision loss, left eye     Tobacco History: Social History   Tobacco Use  Smoking Status Current Every Day Smoker  . Packs/day: 0.50  . Years: 40.00  . Pack years: 20.00  . Types: Cigarettes  . Last attempt to  quit: 2020  . Years since quitting: 1.7  Smokeless Tobacco Never Used   Ready to quit: Yes Counseling given: Not Answered   Outpatient Medications Prior to Visit  Medication Sig Dispense Refill  . albuterol (PROVENTIL) (2.5 MG/3ML) 0.083% nebulizer solution Take 2.5 mg by nebulization every 4 (four) hours as needed for wheezing.    albuterol (VENTOLIN HFA) 108 (90 Base) MCG/ACT inhaler Inhale 2 puffs into the lungs every 6 (six) hours as needed for wheezing or shortness of breath. 8 g 1  . dextromethorphan-guaiFENesin (MUCINEX DM) 30-600 MG 12hr tablet Take 1 tablet by mouth 2 (two) times daily for 7 days. 14 tablet 0  . guaiFENesin-codeine 100-10 MG/5ML syrup Take 10 mLs by mouth 3 (three) times daily as needed (nighttime cough). 120 mL 0  . nicotine (NICODERM CQ - DOSED IN MG/24 HOURS) 21 mg/24hr patch Place 1 patch (21 mg total) onto the skin daily. 28 patch 0  . predniSONE (DELTASONE) 10 MG tablet Take 6 tablets (60 mg total) by mouth daily with breakfast for 2 days, THEN 4 tablets (40 mg total) daily with breakfast for 2 days, THEN 3 tablets (30 mg total) daily with breakfast for 2 days, THEN 2 tablets (20 mg total) daily with breakfast for 2 days, THEN 1 tablet (10 mg total) daily with breakfast for 2 days. 32 tablet 0  . VIAGRA 50 MG tablet Take 50 mg by mouth as needed for erectile dysfunction.    . Tiotropium Bromide Monohydrate (SPIRIVA RESPIMAT) 2.5 MCG/ACT AERS Inhale 2 puffs  into the lungs daily. 4 g 0  . DULERA 200-5 MCG/ACT AERO Inhale 2 puffs into the lungs 2 (two) times daily. (Patient not taking: Reported on 07/30/2020)     No facility-administered medications prior to visit.    Review of Systems  Review of Systems  Constitutional: Negative.   HENT: Negative.   Respiratory: Positive for cough. Negative for chest tightness, shortness of breath and wheezing.   Cardiovascular: Negative.     Physical Exam  BP 124/74 (BP Location: Left Arm, Patient Position: Sitting,  Cuff Size: Normal)   Pulse 91   Temp (!) 97.5 F (36.4 C) (Temporal)   Ht 5' 6.5" (1.689 m)   Wt 177 lb 3.2 oz (80.4 kg)   SpO2 99%   BMI 28.17 kg/m  Physical Exam Constitutional:      Appearance: Normal appearance.  HENT:     Mouth/Throat:     Comments: Deferred d/t masking Cardiovascular:     Rate and Rhythm: Normal rate and regular rhythm.  Pulmonary:     Effort: Pulmonary effort is normal.     Breath sounds: Normal breath sounds.  Musculoskeletal:        General: Normal range of motion.  Skin:    General: Skin is warm and dry.  Neurological:     General: No focal deficit present.     Mental Status: He is alert and oriented to person, place, and time. Mental status is at baseline.  Psychiatric:        Mood and Affect: Mood normal.        Behavior: Behavior normal.        Thought Content: Thought content normal.        Judgment: Judgment normal.      Lab Results:  CBC    Component Value Date/Time   WBC 14.6 (H) 07/28/2020 0557   RBC 4.79 07/28/2020 0557   HGB 14.9 07/28/2020 0557   HCT 44.7 07/28/2020 0557   PLT 266 07/28/2020 0557   MCV 93.3 07/28/2020 0557   MCH 31.1 07/28/2020 0557   MCHC 33.3 07/28/2020 0557   RDW 14.1 07/28/2020 0557   LYMPHSABS 3.4 05/07/2020 0543   MONOABS 0.5 05/07/2020 0543   EOSABS 0.5 05/07/2020 0543   BASOSABS 0.1 05/07/2020 0543    BMET    Component Value Date/Time   NA 136 07/26/2020 0556   K 4.6 07/26/2020 0556   CL 99 07/26/2020 0556   CO2 28 07/26/2020 0556   GLUCOSE 190 (H) 07/26/2020 0556   BUN 17 07/26/2020 0556   CREATININE 0.74 07/26/2020 0556   CREATININE 1.03 07/28/2011 0956   CALCIUM 9.2 07/26/2020 0556   GFRNONAA >60 07/26/2020 0556   GFRAA >60 07/26/2020 0556    BNP    Component Value Date/Time   BNP 34.2 07/25/2020 0827    ProBNP No results found for: PROBNP  Imaging: DG Chest Port 1 View  Result Date: 07/25/2020 CLINICAL DATA:  Shortness of breath.  History of COPD and asthma. EXAM:  PORTABLE CHEST 1 VIEW COMPARISON:  05/27/2020 FINDINGS: The heart size and mediastinal contours are within normal limits. Both lungs are clear. Similar chronic mild hyperinflation. Mild blunting of the right costophrenic sulcus, possibly representing a small pleural effusion. No pneumothorax. The visualized skeletal structures are unremarkable. Similar small metallic density projecting over the right upper quadrant, likely representing a BB. IMPRESSION: 1. Possible small right pleural effusion versus pleural scarring. Dedicated PA and lateral radiographs could further evaluate, if indicated. 2. Otherwise,  no acute cardiopulmonary abnormality. 3. Similar chronic mild hyperinflation. Electronically Signed   By: Feliberto Harts MD   On: 07/25/2020 08:42     Assessment & Plan:   COPD with acute exacerbation (HCC) - Hospitalized in September for AECOPD, no infiltrate on CXR. He ran out of Spiriva samples. Treated with zithromax, solumedrol and prednisone taper. He is clinically improving, chronic cough with clear-brown mucus. Remains on prednisone taper. Rare SABA use.  - Continue Dulera two puffs TWICE daily - Resume Spiriva 2 puffs ONCE daily  - Use Albuterol rescue inhaler 2 puffs every 4-6 hours AS NEEDED for breakthrough shortness of breath - Take mucinex twice daily as needed for cough - Follow-up 8 weeks with Dr. Jayme Cloud   Acute on chronic respiratory failure with hypoxia [facility]) - He qualified for home oxygen during hospitalization in September 2021  Glenford Bayley, NP 08/13/2020

## 2020-07-30 NOTE — Patient Instructions (Addendum)
Recommendations: - Continue Dulera two puffs TWICE daily - Continue Spiriva 2 puffs ONCE daily  - Use Albuterol rescue inhaler 2 puffs every 4-6 hours AS NEEDED for breakthrough shortness of breath - Take mucinex twice daily as needed for cough  Follow-up: - 8 weeks with Dr. Jayme Cloud   Chronic Obstructive Pulmonary Disease Chronic obstructive pulmonary disease (COPD) is a long-term (chronic) lung problem. When you have COPD, it is hard for air to get in and out of your lungs. Usually the condition gets worse over time, and your lungs will never return to normal. There are things you can do to keep yourself as healthy as possible.  Your doctor may treat your condition with: ? Medicines. ? Oxygen. ? Lung surgery.  Your doctor may also recommend: ? Rehabilitation. This includes steps to make your body work better. It may involve a team of specialists. ? Quitting smoking, if you smoke. ? Exercise and changes to your diet. ? Comfort measures (palliative care). Follow these instructions at home: Medicines  Take over-the-counter and prescription medicines only as told by your doctor.  Talk to your doctor before taking any cough or allergy medicines. You may need to avoid medicines that cause your lungs to be dry. Lifestyle  If you smoke, stop. Smoking makes the problem worse. If you need help quitting, ask your doctor.  Avoid being around things that make your breathing worse. This may include smoke, chemicals, and fumes.  Stay active, but remember to rest as well.  Learn and use tips on how to relax.  Make sure you get enough sleep. Most adults need at least 7 hours of sleep every night.  Eat healthy foods. Eat smaller meals more often. Rest before meals. Controlled breathing Learn and use tips on how to control your breathing as told by your doctor. Try:  Breathing in (inhaling) through your nose for 1 second. Then, pucker your lips and breath out (exhale) through your lips  for 2 seconds.  Putting one hand on your belly (abdomen). Breathe in slowly through your nose for 1 second. Your hand on your belly should move out. Pucker your lips and breathe out slowly through your lips. Your hand on your belly should move in as you breathe out.  Controlled coughing Learn and use controlled coughing to clear mucus from your lungs. Follow these steps: 1. Lean your head a little forward. 2. Breathe in deeply. 3. Try to hold your breath for 3 seconds. 4. Keep your mouth slightly open while coughing 2 times. 5. Spit any mucus out into a tissue. 6. Rest and do the steps again 1 or 2 times as needed. General instructions  Make sure you get all the shots (vaccines) that your doctor recommends. Ask your doctor about a flu shot and a pneumonia shot.  Use oxygen therapy and pulmonary rehabilitation if told by your doctor. If you need home oxygen therapy, ask your doctor if you should buy a tool to measure your oxygen level (oximeter).  Make a COPD action plan with your doctor. This helps you to know what to do if you feel worse than usual.  Manage any other conditions you have as told by your doctor.  Avoid going outside when it is very hot, cold, or humid.  Avoid people who have a sickness you can catch (contagious).  Keep all follow-up visits as told by your doctor. This is important. Contact a doctor if:  You cough up more mucus than usual.  There is a  change in the color or thickness of the mucus.  It is harder to breathe than usual.  Your breathing is faster than usual.  You have trouble sleeping.  You need to use your medicines more often than usual.  You have trouble doing your normal activities such as getting dressed or walking around the house. Get help right away if:  You have shortness of breath while resting.  You have shortness of breath that stops you from: ? Being able to talk. ? Doing normal activities.  Your chest hurts for longer than 5  minutes.  Your skin color is more blue than usual.  Your pulse oximeter shows that you have low oxygen for longer than 5 minutes.  You have a fever.  You feel too tired to breathe normally. Summary  Chronic obstructive pulmonary disease (COPD) is a long-term lung problem.  The way your lungs work will never return to normal. Usually the condition gets worse over time. There are things you can do to keep yourself as healthy as possible.  Take over-the-counter and prescription medicines only as told by your doctor.  If you smoke, stop. Smoking makes the problem worse. This information is not intended to replace advice given to you by your health care provider. Make sure you discuss any questions you have with your health care provider. Document Revised: 10/02/2017 Document Reviewed: 11/24/2016 Elsevier Patient Education  2020 [employer].

## 2020-08-13 ENCOUNTER — Encounter: Payer: Self-pay | Admitting: Primary Care

## 2020-08-13 NOTE — Assessment & Plan Note (Addendum)
-   He qualified for home oxygen during hospitalization in September 2021

## 2020-08-13 NOTE — Assessment & Plan Note (Addendum)
-   Hospitalized in September for AECOPD, no infiltrate on CXR. He ran out of Spiriva samples. Treated with zithromax, solumedrol and prednisone taper. He is clinically improving, chronic cough with clear-brown mucus. Remains on prednisone taper. Rare SABA use.  - Continue Dulera two puffs TWICE daily - Resume Spiriva 2 puffs ONCE daily  - Use Albuterol rescue inhaler 2 puffs every 4-6 hours AS NEEDED for breakthrough shortness of breath - Take mucinex twice daily as needed for cough - Follow-up 8 weeks with Dr. Jayme Cloud

## 2020-08-17 NOTE — Progress Notes (Signed)
Agree with the details of the visit as noted by Elizabeth Walsh, NP.  C. Laura Judit Awad, MD [facility] 

## 2020-09-09 ENCOUNTER — Emergency Department: Payer: Medicaid Other

## 2020-09-09 ENCOUNTER — Other Ambulatory Visit: Payer: Self-pay

## 2020-09-09 ENCOUNTER — Inpatient Hospital Stay
Admission: EM | Admit: 2020-09-09 | Discharge: 2020-09-12 | DRG: 190 | Disposition: A | Discharge status: Home / Self Care | Payer: Medicaid Other | Attending: Student | Admitting: Student

## 2020-09-09 ENCOUNTER — Encounter: Payer: Self-pay | Admitting: Emergency Medicine

## 2020-09-09 DIAGNOSIS — Z8616 Personal history of COVID-19: Secondary | ICD-10-CM

## 2020-09-09 DIAGNOSIS — H548 Legal blindness, as defined in USA: Secondary | ICD-10-CM | POA: Diagnosis present

## 2020-09-09 DIAGNOSIS — N529 Male erectile dysfunction, unspecified: Secondary | ICD-10-CM | POA: Diagnosis present

## 2020-09-09 DIAGNOSIS — F1010 Alcohol abuse, uncomplicated: Secondary | ICD-10-CM | POA: Diagnosis not present

## 2020-09-09 DIAGNOSIS — J441 Chronic obstructive pulmonary disease with (acute) exacerbation: Secondary | ICD-10-CM | POA: Diagnosis present

## 2020-09-09 DIAGNOSIS — T380X5A Adverse effect of glucocorticoids and synthetic analogues, initial encounter: Secondary | ICD-10-CM | POA: Diagnosis present

## 2020-09-09 DIAGNOSIS — F17210 Nicotine dependence, cigarettes, uncomplicated: Secondary | ICD-10-CM | POA: Diagnosis present

## 2020-09-09 DIAGNOSIS — J9622 Acute and chronic respiratory failure with hypercapnia: Secondary | ICD-10-CM | POA: Diagnosis present

## 2020-09-09 DIAGNOSIS — Z720 Tobacco use: Secondary | ICD-10-CM | POA: Diagnosis present

## 2020-09-09 DIAGNOSIS — K219 Gastro-esophageal reflux disease without esophagitis: Secondary | ICD-10-CM | POA: Diagnosis present

## 2020-09-09 DIAGNOSIS — N400 Enlarged prostate without lower urinary tract symptoms: Secondary | ICD-10-CM | POA: Diagnosis present

## 2020-09-09 DIAGNOSIS — Z7951 Long term (current) use of inhaled steroids: Secondary | ICD-10-CM | POA: Diagnosis not present

## 2020-09-09 DIAGNOSIS — Z79899 Other long term (current) drug therapy: Secondary | ICD-10-CM

## 2020-09-09 DIAGNOSIS — I160 Hypertensive urgency: Secondary | ICD-10-CM | POA: Diagnosis present

## 2020-09-09 DIAGNOSIS — Z20822 Contact with and (suspected) exposure to covid-19: Secondary | ICD-10-CM | POA: Diagnosis present

## 2020-09-09 DIAGNOSIS — Z87891 Personal history of nicotine dependence: Secondary | ICD-10-CM | POA: Diagnosis present

## 2020-09-09 DIAGNOSIS — Z8249 Family history of ischemic heart disease and other diseases of the circulatory system: Secondary | ICD-10-CM | POA: Diagnosis not present

## 2020-09-09 DIAGNOSIS — I10 Essential (primary) hypertension: Secondary | ICD-10-CM | POA: Diagnosis present

## 2020-09-09 DIAGNOSIS — J9621 Acute and chronic respiratory failure with hypoxia: Secondary | ICD-10-CM | POA: Diagnosis present

## 2020-09-09 DIAGNOSIS — J9601 Acute respiratory failure with hypoxia: Secondary | ICD-10-CM

## 2020-09-09 LAB — COMPREHENSIVE METABOLIC PANEL
ALT: 27 U/L (ref 0–44)
AST: 29 U/L (ref 15–41)
Albumin: 4.4 g/dL (ref 3.5–5.0)
Alkaline Phosphatase: 86 U/L (ref 38–126)
Anion gap: 12 (ref 5–15)
BUN: 21 mg/dL (ref 8–23)
CO2: 28 mmol/L (ref 22–32)
Calcium: 8.9 mg/dL (ref 8.9–10.3)
Chloride: 101 mmol/L (ref 98–111)
Creatinine, Ser: 0.91 mg/dL (ref 0.61–1.24)
GFR, Estimated: >60 mL/min (ref >60)
Glucose, Bld: 129 mg/dL — ABNORMAL HIGH (ref 70–99)
Potassium: 3.8 mmol/L (ref 3.5–5.1)
Sodium: 141 mmol/L (ref 135–145)
Total Bilirubin: 0.9 mg/dL (ref 0.3–1.2)
Total Protein: 7.7 g/dL (ref 6.5–8.1)

## 2020-09-09 LAB — CBC WITH DIFFERENTIAL/PLATELET
Abs Immature Granulocytes: 0.02 10*3/uL (ref 0.00–0.07)
Basophils Absolute: 0.1 10*3/uL (ref 0.0–0.1)
Basophils Relative: 1 %
Eosinophils Absolute: 0.4 10*3/uL (ref 0.0–0.5)
Eosinophils Relative: 4 %
HCT: 44.4 % (ref 39.0–52.0)
Hemoglobin: 15.1 g/dL (ref 13.0–17.0)
Immature Granulocytes: 0 %
Lymphocytes Relative: 42 %
Lymphs Abs: 4.4 10*3/uL — ABNORMAL HIGH (ref 0.7–4.0)
MCH: 30.9 pg (ref 26.0–34.0)
MCHC: 34 g/dL (ref 30.0–36.0)
MCV: 91 fL (ref 80.0–100.0)
Monocytes Absolute: 0.7 10*3/uL (ref 0.1–1.0)
Monocytes Relative: 7 %
Neutro Abs: 4.9 10*3/uL (ref 1.7–7.7)
Neutrophils Relative %: 46 %
Platelets: 237 10*3/uL (ref 150–400)
RBC: 4.88 MIL/uL (ref 4.22–5.81)
RDW: 13 % (ref 11.5–15.5)
WBC: 10.5 10*3/uL (ref 4.0–10.5)
nRBC: 0 % (ref 0.0–0.2)

## 2020-09-09 LAB — RESP PANEL BY RT PCR (RSV, FLU A&B, COVID)
Influenza A by PCR: NEGATIVE
Influenza B by PCR: NEGATIVE
Respiratory Syncytial Virus by PCR: NEGATIVE
SARS Coronavirus 2 by RT PCR: NEGATIVE

## 2020-09-09 LAB — TROPONIN I (HIGH SENSITIVITY): Troponin I (High Sensitivity): 9 ng/L (ref <18)

## 2020-09-09 LAB — HIV ANTIBODY (ROUTINE TESTING W REFLEX): HIV Screen 4th Generation wRfx: NONREACTIVE

## 2020-09-09 LAB — BLOOD GAS, VENOUS

## 2020-09-09 LAB — BRAIN NATRIURETIC PEPTIDE: B Natriuretic Peptide: 37.4 pg/mL (ref 0.0–100.0)

## 2020-09-09 MED ORDER — IPRATROPIUM-ALBUTEROL 0.5-2.5 (3) MG/3ML IN SOLN
RESPIRATORY_TRACT | Status: AC
  Administered 2020-09-09: 9 mL via RESPIRATORY_TRACT
  Filled 2020-09-09: qty 9

## 2020-09-09 MED ORDER — MAGNESIUM SULFATE 2 GM/50ML IV SOLN: 2.0000 g | Freq: Once | INTRAVENOUS | Status: AC

## 2020-09-09 MED ORDER — ENOXAPARIN SODIUM 40 MG/0.4ML ~~LOC~~ SOLN
40.0000 mg | SUBCUTANEOUS | Status: DC
  Administered 2020-09-09 – 2020-09-11 (×3): 40 mg via SUBCUTANEOUS
  Filled 2020-09-09 (×4): qty 0.4

## 2020-09-09 MED ORDER — IPRATROPIUM-ALBUTEROL 0.5-2.5 (3) MG/3ML IN SOLN: 9.0000 mL | Freq: Once | RESPIRATORY_TRACT | Status: AC

## 2020-09-09 MED ORDER — METHYLPREDNISOLONE SODIUM SUCC 40 MG IJ SOLR
40.0000 mg | Freq: Two times a day (BID) | INTRAMUSCULAR | Status: DC
  Administered 2020-09-09 – 2020-09-11 (×4): 40 mg via INTRAVENOUS
  Filled 2020-09-09 (×4): qty 1

## 2020-09-09 MED ORDER — SODIUM CHLORIDE 0.9 % IV SOLN
1.0000 g | Freq: Once | INTRAVENOUS | Status: AC
  Administered 2020-09-09: 1 g via INTRAVENOUS

## 2020-09-09 MED ORDER — FOLIC ACID 1 MG PO TABS
1.0000 mg | ORAL_TABLET | Freq: Every day | ORAL | Status: DC
  Administered 2020-09-09 – 2020-09-12 (×4): 1 mg via ORAL
  Filled 2020-09-09 (×4): qty 1

## 2020-09-09 MED ORDER — ONDANSETRON HCL 4 MG/2ML IJ SOLN: 4.0000 mg | Freq: Three times a day (TID) | INTRAMUSCULAR | Status: DC | PRN

## 2020-09-09 MED ORDER — THIAMINE HCL 100 MG PO TABS
100.0000 mg | ORAL_TABLET | Freq: Every day | ORAL | Status: DC
  Administered 2020-09-09 – 2020-09-12 (×4): 100 mg via ORAL
  Filled 2020-09-09 (×4): qty 1

## 2020-09-09 MED ORDER — AZITHROMYCIN 250 MG PO TABS
250.0000 mg | ORAL_TABLET | Freq: Every day | ORAL | Status: DC
  Administered 2020-09-10 – 2020-09-12 (×3): 250 mg via ORAL
  Filled 2020-09-09 (×3): qty 1

## 2020-09-09 MED ORDER — AZITHROMYCIN 500 MG PO TABS
500.0000 mg | ORAL_TABLET | Freq: Every day | ORAL | Status: AC
  Administered 2020-09-09: 500 mg via ORAL
  Filled 2020-09-09: qty 1

## 2020-09-09 MED ORDER — NICOTINE 21 MG/24HR TD PT24
21.0000 mg | MEDICATED_PATCH | Freq: Every day | TRANSDERMAL | Status: DC
  Administered 2020-09-09 – 2020-09-10 (×2): 21 mg via TRANSDERMAL
  Filled 2020-09-09 (×3): qty 1

## 2020-09-09 MED ORDER — LORAZEPAM 2 MG/ML IJ SOLN: 0.0000 mg | Freq: Two times a day (BID) | INTRAMUSCULAR | Status: DC

## 2020-09-09 MED ORDER — MAGNESIUM SULFATE 2 GM/50ML IV SOLN
INTRAVENOUS | Status: AC
  Administered 2020-09-09: 2 g via INTRAVENOUS
  Filled 2020-09-09: qty 50

## 2020-09-09 MED ORDER — FLUTICASONE FUROATE-VILANTEROL 200-25 MCG/INH IN AEPB
1.0000 | INHALATION_SPRAY | Freq: Every day | RESPIRATORY_TRACT | Status: DC
  Administered 2020-09-09 – 2020-09-12 (×3): 1 via RESPIRATORY_TRACT
  Filled 2020-09-09 (×2): qty 28

## 2020-09-09 MED ORDER — LORAZEPAM 1 MG PO TABS: 1.0000 mg | ORAL_TABLET | ORAL | Status: AC | PRN

## 2020-09-09 MED ORDER — ADULT MULTIVITAMIN W/MINERALS CH
1.0000 | ORAL_TABLET | Freq: Every day | ORAL | Status: DC
  Administered 2020-09-09 – 2020-09-12 (×4): 1 via ORAL
  Filled 2020-09-09 (×4): qty 1

## 2020-09-09 MED ORDER — LORAZEPAM 2 MG/ML IJ SOLN: 0.0000 mg | Freq: Four times a day (QID) | INTRAMUSCULAR | Status: DC

## 2020-09-09 MED ORDER — IPRATROPIUM-ALBUTEROL 0.5-2.5 (3) MG/3ML IN SOLN
3.0000 mL | RESPIRATORY_TRACT | Status: DC
  Administered 2020-09-09 – 2020-09-11 (×10): 3 mL via RESPIRATORY_TRACT
  Filled 2020-09-09 (×10): qty 3

## 2020-09-09 MED ORDER — LORAZEPAM 2 MG/ML IJ SOLN: 1.0000 mg | INTRAMUSCULAR | Status: AC | PRN

## 2020-09-09 MED ORDER — DM-GUAIFENESIN ER 30-600 MG PO TB12
1.0000 | ORAL_TABLET | Freq: Two times a day (BID) | ORAL | Status: DC | PRN
  Administered 2020-09-09: 1 via ORAL
  Filled 2020-09-09: qty 1

## 2020-09-09 MED ORDER — PANTOPRAZOLE SODIUM 40 MG PO TBEC: 40.0000 mg | DELAYED_RELEASE_TABLET | Freq: Every day | ORAL | Status: DC | PRN

## 2020-09-09 MED ORDER — MOMETASONE FURO-FORMOTEROL FUM 200-5 MCG/ACT IN AERO: 2.0000 | INHALATION_SPRAY | Freq: Two times a day (BID) | RESPIRATORY_TRACT | Status: DC

## 2020-09-09 MED ORDER — ACETAMINOPHEN 325 MG PO TABS: 650.0000 mg | ORAL_TABLET | Freq: Four times a day (QID) | ORAL | Status: DC | PRN

## 2020-09-09 MED ORDER — HYDRALAZINE HCL 20 MG/ML IJ SOLN: 5.0000 mg | INTRAMUSCULAR | Status: DC | PRN

## 2020-09-09 MED ORDER — ALBUTEROL SULFATE (2.5 MG/3ML) 0.083% IN NEBU: 2.5000 mg | INHALATION_SOLUTION | RESPIRATORY_TRACT | Status: DC | PRN

## 2020-09-09 MED ORDER — THIAMINE HCL 100 MG/ML IJ SOLN: 100.0000 mg | Freq: Every day | INTRAMUSCULAR | Status: DC

## 2020-09-09 NOTE — ED Notes (Signed)
Pt denies cough, fevers, or other symptoms beyond his shob.

## 2020-09-09 NOTE — ED Triage Notes (Addendum)
Pt arrival via [facility] from home due to shob. Pt has had increasing shob that started tonight and despite 4 breathing treatments at home of albuterol, pt still shob and called EMS. Pt on RA in 80's upon EMS arrival and placed on non-rebreather at 25 L. Pt sats came up to 100%. Pt has no other complaints, given 125 solumedrol with EMS.   VS: -BP 145/78 -HR 92   Pt lung sounds diminished with EMS. RT and Dr. Don Perking at bedside.

## 2020-09-09 NOTE — Progress Notes (Signed)
Patient admitted to unit from ED. No hx of falls. Non-skid socks placed on patient. VSS at admission. Denies CP/SOB. Oriented to room, call bell, and nursing team.

## 2020-09-09 NOTE — H&P (Signed)
History and Physical    Bryan BirksWalter J Rands ZOX:[id number] DOB: Feb 15, 1956 DOA: 09/09/2020  Referring MD/NP/PA:   PCP: Marya FossaLeyva, Teresa, PA-C   Patient coming from:  The patient is coming from home.  At baseline, pt is independent for most of ADL.        Chief Complaint: SOB  HPI: Bryan Kelley is a 64 y.o. male with medical history significant of COPD, asthma, hypertension, GERD, depression, anxiety, left eye blindness, BPH, alcohol abuse, tobacco abuse, previous COVID-19 infection, who presents with shortness of breath.  Pt states that he started having shortness breath, mild cough and wheezing since last night.  His shortness breath has been progressively worsening. He took took 4 breathing treatments with no significant relief. Pt was found to have oxygen desaturation to 80s on room air. He was placed on a nonrebreather and transported to the emergency room.  Patient received 125 mg of IV Solu-Medrol in route.  Patient arrives in severe respiratory distress and BiPAP is started. Pt denies chest pain, fever or chills.  No nausea, vomiting, diarrhea, abdominal pain, symptoms of UTI or unilateral weakness.  ED Course: pt was found to have WBC 10.4, negative Covid PCR, electrolytes renal function okay, troponin 9, temperature 97.5, blood pressure 137/79, heart rate 99, RR 20, VBG with pH 7.36, CO2 54 and O2 65. Chest x-ray showed trace right pleural effusion without infiltration.  Patient is admitted to progressive bed as inpatient  Review of Systems:   General: no fevers, chills, no body weight gain, has fatigue HEENT: no hearing changes or sore throat. Has left eye blindness Respiratory: has dyspnea, coughing, wheezing CV: no chest pain, no palpitations GI: no nausea, vomiting, abdominal pain, diarrhea, constipation GU: no dysuria, burning on urination, increased urinary frequency, hematuria  Ext: no leg edema Neuro: no unilateral weakness, numbness, or tingling, no vision change or hearing  loss Skin: no rash, no skin tear. MSK: No muscle spasm, no deformity, no limitation of range of movement in spin Heme: No easy bruising.  Travel history: No recent long distant travel.  Allergy: No Known Allergies  Past Medical History:  Diagnosis Date  . Asthma   . COPD (chronic obstructive pulmonary disease) (HCC)   . Depression   . GERD (gastroesophageal reflux disease)   . Vision loss, left eye     Past Surgical History:  Procedure Laterality Date  . EYE SURGERY  2009, about [id number]   steel in left eye on the job, legally blind     Social History:  reports that he has been smoking cigarettes. He has a 20.00 pack-year smoking history. He has never used smokeless tobacco. He reports current alcohol use. He reports that he does not use drugs.  Family History:  Family History  Problem Relation Age of Onset  . Diabetes Mother   . Hypertension Mother   . Stroke Mother   . Asthma Father   . Diabetes Father   . Diabetes Sister   . Hypertension Sister   . Diabetes Brother   . Hypertension Brother      Prior to Admission medications   Medication Sig Start Date End Date Taking? Authorizing Provider  albuterol (PROVENTIL) (2.5 MG/3ML) 0.083% nebulizer solution Take 2.5 mg by nebulization every 4 (four) hours as needed for wheezing. 06/06/20  Yes [provider]  albuterol (VENTOLIN HFA) 108 (90 Base) MCG/ACT inhaler Inhale 2 puffs into the lungs every 6 (six) hours as needed for wheezing or shortness of breath. 11/30/19  Yes  Don Perking, [place], MD  DULERA 200-5 MCG/ACT AERO Inhale 2 puffs into the lungs 2 (two) times daily. Patient not taking: Reported on 07/30/2020 03/03/20   [provider]  guaiFENesin-codeine 100-10 MG/5ML syrup Take 10 mLs by mouth 3 (three) times daily as needed (nighttime cough). 07/28/20   Pennie Banter, DO  nicotine (NICODERM CQ - DOSED IN MG/24 HOURS) 21 mg/24hr patch Place 1 patch (21 mg total) onto the skin daily. 07/29/20   Pennie Banter, DO  Tiotropium Bromide Monohydrate (SPIRIVA RESPIMAT) 2.5 MCG/ACT AERS Inhale 2 puffs into the lungs daily for 1 day. 07/30/20 07/31/20  Glenford Bayley, NP  Tiotropium Bromide Monohydrate (SPIRIVA RESPIMAT) 2.5 MCG/ACT AERS Inhale 2 puffs into the lungs daily. 07/30/20   Glenford Bayley, NP  VIAGRA 50 MG tablet Take 50 mg by mouth as needed for erectile dysfunction. 03/03/20   [provider]    Physical Exam: Vitals:   09/09/20 0459 09/09/20 0508 09/09/20 0530 09/09/20 0600  BP:   (!) 154/91 137/79  Pulse:  99 94 89  Resp:  16 18 20   Temp:  (!) 97.5 F (36.4 C)    TempSrc:  Axillary    SpO2:  98% 98% 96%  Weight: 78 kg     Height: 5\' 6"  (1.676 m)      General: Not in acute distress HEENT:       Eyes: PERRL, EOMI, no scleral icterus.       ENT: No discharge from the ears and nose, no pharynx injection, no tonsillar enlargement.        Neck: No JVD, no bruit, no mass felt. Heme: No neck lymph node enlargement. Cardiac: S1/S2, RRR, No murmurs, No gallops or rubs. Respiratory: has decreased air movement and wheezing bilaterally GI: Soft, nondistended, nontender, no rebound pain, no organomegaly, BS present. GU: No hematuria Ext: No pitting leg edema bilaterally. 2+DP/PT pulse bilaterally. Musculoskeletal: No joint deformities, No joint redness or warmth, no limitation of ROM in spin. Skin: No rashes.  Neuro: Alert, oriented X3, cranial nerves II-XII grossly intact, moves all extremities normally. Psych: Patient is not psychotic, no suicidal or hemocidal ideation.  Labs on Admission: I have personally reviewed following labs and imaging studies  CBC: Recent Labs  Lab 09/09/20 0500  WBC 10.5  NEUTROABS 4.9  HGB 15.1  HCT 44.4  MCV 91.0  PLT 237   Basic Metabolic Panel: Recent Labs  Lab 09/09/20 0500  NA 141  K 3.8  CL 101  CO2 28  GLUCOSE 129*  BUN 21  CREATININE 0.91  CALCIUM 8.9   GFR: Estimated Creatinine Clearance: 81.7 mL/min (by C-G  formula based on SCr of 0.91 mg/dL). Liver Function Tests: Recent Labs  Lab 09/09/20 0500  AST 29  ALT 27  ALKPHOS 86  BILITOT 0.9  PROT 7.7  ALBUMIN 4.4   No results for input(s): LIPASE, AMYLASE in the last 168 hours. No results for input(s): AMMONIA in the last 168 hours. Coagulation Profile: No results for input(s): INR, PROTIME in the last 168 hours. Cardiac Enzymes: No results for input(s): CKTOTAL, CKMB, CKMBINDEX, TROPONINI in the last 168 hours. BNP (last 3 results) No results for input(s): PROBNP in the last 8760 hours. HbA1C: No results for input(s): HGBA1C in the last 72 hours. CBG: No results for input(s): GLUCAP in the last 168 hours. Lipid Profile: No results for input(s): CHOL, HDL, LDLCALC, TRIG, CHOLHDL, LDLDIRECT in the last 72 hours. Thyroid Function Tests: No results for  input(s): TSH, T4TOTAL, FREET4, T3FREE, THYROIDAB in the last 72 hours. Anemia Panel: No results for input(s): VITAMINB12, FOLATE, FERRITIN, TIBC, IRON, RETICCTPCT in the last 72 hours. Urine analysis: No results found for: COLORURINE, APPEARANCEUR, LABSPEC, PHURINE, GLUCOSEU, HGBUR, BILIRUBINUR, KETONESUR, PROTEINUR, UROBILINOGEN, NITRITE, LEUKOCYTESUR Sepsis Labs: @LABRCNTIP (procalcitonin:4,lacticidven:4) ) Recent Results (from the past 240 hour(s))  Resp Panel by RT PCR (RSV, Flu A&B, Covid) - Nasopharyngeal Swab     Status: None   Collection Time: 09/09/20  5:00 AM   Specimen: Nasopharyngeal Swab  Result Value Ref Range Status   SARS Coronavirus 2 by RT PCR NEGATIVE NEGATIVE Final    Comment: (NOTE) SARS-CoV-2 target nucleic acids are NOT DETECTED.  The SARS-CoV-2 RNA is generally detectable in upper respiratoy specimens during the acute phase of infection. The lowest concentration of SARS-CoV-2 viral copies this assay can detect is 131 copies/mL. A negative result does not preclude SARS-Cov-2 infection and should not be used as the sole basis for treatment or other patient  management decisions. A negative result may occur with  improper specimen collection/handling, submission of specimen other than nasopharyngeal swab, presence of viral mutation(s) within the areas targeted by this assay, and inadequate number of viral copies (<131 copies/mL). A negative result must be combined with clinical observations, patient history, and epidemiological information. The expected result is Negative.  Fact Sheet for Patients:  13/07/21  Fact Sheet for Healthcare Providers:  https://www.moore.com/  This test is no t yet approved or cleared by the https://www.young.biz/ FDA and  has been authorized for detection and/or diagnosis of SARS-CoV-2 by FDA under an Emergency Use Authorization (EUA). This EUA will remain  in effect (meaning this test can be used) for the duration of the COVID-19 declaration under Section 564(b)(1) of the Act, 21 U.S.C. section 360bbb-3(b)(1), unless the authorization is terminated or revoked sooner.     Influenza A by PCR NEGATIVE NEGATIVE Final   Influenza B by PCR NEGATIVE NEGATIVE Final    Comment: (NOTE) The Xpert Xpress SARS-CoV-2/FLU/RSV assay is intended as an aid in  the diagnosis of influenza from Nasopharyngeal swab specimens and  should not be used as a sole basis for treatment. Nasal washings and  aspirates are unacceptable for Xpert Xpress SARS-CoV-2/FLU/RSV  testing.  Fact Sheet for Patients: [place]  Fact Sheet for Healthcare Providers: https://www.moore.com/  This test is not yet approved or cleared by the https://www.young.biz/ FDA and  has been authorized for detection and/or diagnosis of SARS-CoV-2 by  FDA under an Emergency Use Authorization (EUA). This EUA will remain  in effect (meaning this test can be used) for the duration of the  Covid-19 declaration under Section 564(b)(1) of the Act, 21  U.S.C. section 360bbb-3(b)(1),  unless the authorization is  terminated or revoked.    Respiratory Syncytial Virus by PCR NEGATIVE NEGATIVE Final    Comment: (NOTE) Fact Sheet for Patients: [place]  Fact Sheet for Healthcare Providers: https://www.moore.com/  This test is not yet approved or cleared by the https://www.young.biz/ FDA and  has been authorized for detection and/or diagnosis of SARS-CoV-2 by  FDA under an Emergency Use Authorization (EUA). This EUA will remain  in effect (meaning this test can be used) for the duration of the  COVID-19 declaration under Section 564(b)(1) of the Act, 21 U.S.C.  section 360bbb-3(b)(1), unless the authorization is terminated or  revoked. Performed at [facility], [address], [place], [place]      Radiological Exams on Admission: DG Chest Portable  1 View  Result Date: 09/09/2020 CLINICAL DATA:  Shortness of breath. EXAM: PORTABLE CHEST 1 VIEW COMPARISON:  Prior chest radiographs 07/25/2020. FINDINGS: Heart size within normal limits. No appreciable airspace consolidation. Trace right pleural effusion versus pleural scarring, unchanged. No evidence of pneumothorax. No acute bony abnormality identified. Redemonstrated metallic BB projecting in the region of the right lower thorax. IMPRESSION: Trace right pleural effusion versus pleural scarring, unchanged. No appreciable airspace consolidation. Electronically Signed   By: Jackey Loge DO   On: 09/09/2020 06:07     EKG: I have personally reviewed.  Sinus rhythm, QTC 445, LAE, poor R wave progression, T wave inversion in lead II/III   Assessment/Plan Principal Problem:   COPD exacerbation (HCC) Active Problems:   GERD   Alcohol abuse   Acute on chronic respiratory failure with hypoxia (HCC)   Tobacco abuse   HTN (hypertension)  Acute on chronic respiratory failure with hypoxia due to COPD exacerbation [facility]): Patient has cough, shortness of breath,  wheezing on auscultation, no infiltration on chest x-ray, clinically consistent with COPD exacerbation.   -Will admit to progressive unit as inpatient - continue BiPAP -Bronchodilators -Solu-Medrol 40 mg IV bid -Z pak (patient received one dose of Rocephin in ED) -Mucinex for cough  -Incentive spirometry -Follow up blood culture x2, sputum culture -Nasal cannula oxygen as needed to maintain O2 saturation 93% or greater  GERD -prn protonix  HTN (hypertension): Bp 137/79. Not taking med at home -prn IV hydralazine  Tobacco abuse and Alcohol abuse: -Nicotine patch -CIWA protocol   DVT ppx: SQ Lovenox Code Status: Full code Family Communication:   Yes, patient's sister by phone Disposition Plan:  Anticipate discharge back to previous environment Consults called:  none Admission status: progressive unit as inpt       Status is: Inpatient  Remains inpatient appropriate because:Inpatient level of care appropriate due to severity of illness.  Patient has multiple comorbidities, now presents with acute on chronic respiratory failure secondary to COPD exacerbation.  Patient has severe respiratory distress, requiring BiPAP.  His presentation is highly complicated.  Patient is at high risk of deterioration.  Will need to be treated in hospital for at least 2 days.   Dispo: The patient is from: Home              Anticipated d/c is to: Home              Anticipated d/c date is: 2 days              Patient currently is not medically stable to d/c.          Date of Service 09/09/2020    Lorretta Harp [facility]   If 7PM-7AM, please contact night-coverage [email or web address] 09/09/2020, 7:21 AM

## 2020-09-09 NOTE — ED Notes (Signed)
Advised nurse that patient has assigned bed 

## 2020-09-09 NOTE — ED Provider Notes (Addendum)
[facility] Emergency Department Provider Note  ____________________________________________  Time seen: Approximately 5:36 AM  I have reviewed the triage vital signs and the nursing notes.   HISTORY  Chief Complaint Shortness of Breath   HPI Bryan Kelley is a 64 y.o. male with a history of COPD, asthma, GERD, depression, anxiety, alcohol use, tobacco use who presents for evaluation of shortness of breath.   Patient with progressively worsening shortness of breath and wheezing that started tonight.  Patient took 4 breathing treatments with no significant relief.  When EMS was called patient was found to be satting in the 80s.  He was placed on a nonrebreather and transported to the emergency room.  Patient received 125 mg of IV Solu-Medrol in route.  Patient arrives in severe respiratory distress.  No fever or chills.  Denies chest pain vomiting or diarrhea.  Has had a little bit of a worsening productive cough.  No hemoptysis.  No prior history of PE or DVT.  Past Medical History:  Diagnosis Date  . Asthma   . COPD (chronic obstructive pulmonary disease) (HCC)   . Depression   . GERD (gastroesophageal reflux disease)   . Vision loss, left eye     Patient Active Problem List   Diagnosis Date Noted  . Tobacco abuse 07/25/2020  . HTN (hypertension) 07/25/2020  . Acute on chronic respiratory failure with hypoxia (HCC) 12/06/2019  . Nausea vomiting and diarrhea 12/06/2019  . Acute respiratory disease due to COVID-19 virus 12/06/2019  . COPD (chronic obstructive pulmonary disease) (HCC) 10/30/2019  . Hyperglycemia   . COPD with acute exacerbation (HCC) 10/29/2019  . COPD exacerbation (HCC) 10/29/2019  . Epigastric abdominal pain 08/12/2012  . Alcohol abuse 03/14/2012  . Elevated BP 12/07/2011  . ED (erectile dysfunction) 07/28/2011  . NICOTINE ADDICTION 10/29/2009  . ACUTE BRONCHITIS 05/07/2009  . 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 ANKLE  PAIN, RIGHT 10/04/2008  . FATIGUE 10/04/2008  . ANXIETY 02/17/2008  . ASTHMA 02/17/2008  . GERD 02/17/2008  . BPH (benign prostatic hyperplasia) 02/17/2008    Past Surgical History:  Procedure Laterality Date  . EYE SURGERY  2009, about [id number]   steel in left eye on the job, legally blind     Prior to Admission medications   Medication Sig Start Date End Date Taking? Authorizing Provider  albuterol (PROVENTIL) (2.5 MG/3ML) 0.083% nebulizer solution Take 2.5 mg by nebulization every 4 (four) hours as needed for wheezing. 06/06/20   [provider]  albuterol (VENTOLIN HFA) 108 (90 Base) MCG/ACT inhaler Inhale 2 puffs into the lungs every 6 (six) hours as needed for wheezing or shortness of breath. 11/30/19   Don Perking, [place], MD  DULERA 200-5 MCG/ACT AERO Inhale 2 puffs into the lungs 2 (two) times daily. Patient not taking: Reported on 07/30/2020 03/03/20   [provider]  guaiFENesin-codeine 100-10 MG/5ML syrup Take 10 mLs by mouth 3 (three) times daily as needed (nighttime cough). 07/28/20   Pennie Banter, DO  nicotine (NICODERM CQ - DOSED IN MG/24 HOURS) 21 mg/24hr patch Place 1 patch (21 mg total) onto the skin daily. 07/29/20   Pennie Banter, DO  Tiotropium Bromide Monohydrate (SPIRIVA RESPIMAT) 2.5 MCG/ACT AERS Inhale 2 puffs into the lungs daily for 1 day. 07/30/20 07/31/20  Glenford Bayley, NP  Tiotropium Bromide Monohydrate (SPIRIVA RESPIMAT) 2.5 MCG/ACT AERS Inhale 2 puffs into the lungs daily. 07/30/20   Glenford Bayley, NP  VIAGRA 50 MG tablet Take 50  mg by mouth as needed for erectile dysfunction. 03/03/20   [provider]    Allergies Patient has no known allergies.  Family History  Problem Relation Age of Onset  . Diabetes Mother   . Hypertension Mother   . Stroke Mother   . Asthma Father   . Diabetes Father   . Diabetes Sister   . Hypertension Sister   . Diabetes Brother   . Hypertension Brother     Social History Social  History   Tobacco Use  . Smoking status: Current Every Day Smoker    Packs/day: 0.50    Years: 40.00    Pack years: 20.00    Types: Cigarettes    Last attempt to quit: 2020    Years since quitting: 1.8  . Smokeless tobacco: Never Used  Vaping Use  . Vaping Use: Never used  Substance Use Topics  . Alcohol use: Yes    Comment: vodka on the weekends  . Drug use: No    Review of Systems  Constitutional: Negative for fever. Eyes: Negative for visual changes. ENT: Negative for sore throat. Neck: No neck pain  Cardiovascular: Negative for chest pain. Respiratory: + shortness of breath, cough, wheezing Gastrointestinal: Negative for abdominal pain, vomiting or diarrhea. Genitourinary: Negative for dysuria. Musculoskeletal: Negative for back pain. Skin: Negative for rash. Neurological: Negative for headaches, weakness or numbness. Psych: No SI or HI  ____________________________________________   PHYSICAL EXAM:  VITAL SIGNS: ED Triage Vitals  Enc Vitals Group     BP --      Pulse Rate 09/09/20 0508 99     Resp 09/09/20 0508 16     Temp 09/09/20 0508 (!) 97.5 F (36.4 C)     Temp Source 09/09/20 0508 Axillary     SpO2 09/09/20 0458 (!) 80 %     Weight 09/09/20 0459 172 lb (78 kg)     Height 09/09/20 0459 5\' 6"  (1.676 m)     Head Circumference --      Peak Flow --      Pain Score 09/09/20 0500 0     Pain Loc --      Pain Edu? --      Excl. in GC? --     Constitutional: Alert and oriented.  Severe respiratory distress  HEENT:      Head: Normocephalic and atraumatic.         Eyes: Conjunctivae are normal. Sclera is non-icteric.       Mouth/Throat: Mucous membranes are moist.       Neck: Supple with no signs of meningismus. Cardiovascular: Regular rate and rhythm. No murmurs, gallops, or rubs. 2+ symmetrical distal pulses are present in all extremities.  Respiratory: Severe respiratory distress, tachypneic, tripoding, using accessory muscles of respiration,  severely diminished air movement bilaterally with very tight expiratory wheezes  Gastrointestinal: Soft, non tender Musculoskeletal: Trace pitting edema bilateral lower extremities  neurologic: Normal speech and language. Face is symmetric. Moving all extremities. No gross focal neurologic deficits are appreciated. Skin: Skin is warm, dry and intact. No rash noted. Psychiatric: Mood and affect are normal. Speech and behavior are normal.  ____________________________________________   LABS (all labs ordered are listed, but only abnormal results are displayed)  Labs Reviewed  BLOOD GAS, VENOUS - Abnormal; Notable for the following components:      Result Value   pO2, Ven 65.0 (*)    Bicarbonate 30.5 (*)    Acid-Base Excess 3.6 (*)    All other  components within normal limits  CBC WITH DIFFERENTIAL/PLATELET - Abnormal; Notable for the following components:   Lymphs Abs 4.4 (*)    All other components within normal limits  COMPREHENSIVE METABOLIC PANEL - Abnormal; Notable for the following components:   Glucose, Bld 129 (*)    All other components within normal limits  RESP PANEL BY RT PCR (RSV, FLU A&B, COVID)  BRAIN NATRIURETIC PEPTIDE  TROPONIN I (HIGH SENSITIVITY)   ____________________________________________  EKG  ED ECG REPORT I, Nita Sicklearolina Ashton Sabine, the attending physician, personally viewed and interpreted this ECG.  Normal sinus rhythm, rate of 93, normal intervals, normal axis, mild ST depressions inferiorly with no ST elevation.  Unchanged from prior from 9/21   ____________________________________________  RADIOLOGY  I have personally reviewed the images performed during this visit and I agree with the Radiologist's read.   Interpretation by Radiologist:  DG Chest Portable 1 View  Result Date: 09/09/2020 CLINICAL DATA:  Shortness of breath. EXAM: PORTABLE CHEST 1 VIEW COMPARISON:  Prior chest radiographs 07/25/2020. FINDINGS: Heart size within normal limits. No  appreciable airspace consolidation. Trace right pleural effusion versus pleural scarring, unchanged. No evidence of pneumothorax. No acute bony abnormality identified. Redemonstrated metallic BB projecting in the region of the right lower thorax. IMPRESSION: Trace right pleural effusion versus pleural scarring, unchanged. No appreciable airspace consolidation. Electronically Signed   By: Jackey LogeKyle  Golden DO   On: 09/09/2020 06:07      ____________________________________________   PROCEDURES  Procedure(s) performed:yes .1-3 Lead EKG Interpretation Performed by: Nita SickleVeronese, [place], MD Authorized by: Nita SickleVeronese, [place], MD     Interpretation: non-specific     ECG rate assessment: normal     Rhythm: sinus rhythm     Ectopy: none     Critical Care performed: yes  CRITICAL CARE Performed by: Nita Sicklearolina Rayne Cowdrey  ?  Total critical care time: 45 min  Critical care time was exclusive of separately billable procedures and treating other patients.  Critical care was necessary to treat or prevent imminent or life-threatening deterioration.  Critical care was time spent personally by me on the following activities: development of treatment plan with patient and/or surrogate as well as nursing, discussions with consultants, evaluation of patient's response to treatment, examination of patient, obtaining history from patient or surrogate, ordering and performing treatments and interventions, ordering and review of laboratory studies, ordering and review of radiographic studies, pulse oximetry and re-evaluation of patient's condition.  ____________________________________________   INITIAL IMPRESSION / ASSESSMENT AND PLAN / ED COURSE  64 y.o. male with a history of COPD, asthma, GERD, depression, anxiety, alcohol use, tobacco use who presents for evaluation of shortness of breath.  Patient arrives in severe respiratory distress, tripoding, using accessory muscles of respiration, tachypneic, on 15  L nonrebreather with severely diminished air movement bilaterally and very tight expiratory wheezes.  Patient was started on BiPAP immediately.  Status post steroids per EMS.  Patient started on 3 duo nebs and IV magnesium.  No prior history of CHF.  Initial EKG unchanged from baseline with no signs of ischemia.  Ddx COPD exacerbation, asthma exacerbation, pulmonary edema, pneumothorax, pneumonia, viral illness, ACS, CHF.  VBG with a pH of 7.36 and a PCO2 of 54.  No leukocytosis.  First troponin is negative.  Chemistry panel with no acute findings.  Chest x-ray visualized by me with no evidence of pneumothorax, edema, pneumonia, confirmed by radiology.  Will discuss with the hospitalist for admission.  Patient was given a dose of IV Rocephin for severe COPD  exacerbation.  Old medical records reviewed showing several prior similar presentations requiring BiPAP due to COPD.  _________________________ 6:15 AM on 09/09/2020 -----------------------------------------  Patient reassessed, respiratory status improving on BiPAP. Discussed with hospitalist for admission.    _________________________ 6:27 AM on 09/09/2020 -----------------------------------------  Reassessment: Respiratory status markedly improving on BiPAP.  Respiratory rate of 16, patient breathing comfortably.  _____________________________________________ Please note:  Patient was evaluated in Emergency Department today for the symptoms described in the history of present illness. Patient was evaluated in the context of the global COVID-19 pandemic, which necessitated consideration that the patient might be at risk for infection with the SARS-CoV-2 virus that causes COVID-19. Institutional protocols and algorithms that pertain to the evaluation of patients at risk for COVID-19 are in a state of rapid change based on information released by regulatory bodies including the CDC and federal and state organizations. These policies and  algorithms were followed during the patient's care in the ED.  Some ED evaluations and interventions may be delayed as a result of limited staffing during the pandemic.   [place] Controlled Substance Database was reviewed by me. ____________________________________________   FINAL CLINICAL IMPRESSION(S) / ED DIAGNOSES   Final diagnoses:  COPD exacerbation (HCC)  Acute respiratory failure with hypoxia (HCC)      NEW MEDICATIONS STARTED DURING THIS VISIT:  ED Discharge Orders    None       Note:  This document was prepared using Dragon voice recognition software and may include unintentional dictation errors.    Don Perking, [place], MD 09/09/20 3154    Nita Sickle, MD 09/09/20 [phone]

## 2020-09-09 NOTE — Plan of Care (Signed)

## 2020-09-10 LAB — BASIC METABOLIC PANEL
Anion gap: 9 (ref 5–15)
BUN: 23 mg/dL (ref 8–23)
CO2: 27 mmol/L (ref 22–32)
Calcium: 9.2 mg/dL (ref 8.9–10.3)
Chloride: 100 mmol/L (ref 98–111)
Creatinine, Ser: 0.76 mg/dL (ref 0.61–1.24)
GFR, Estimated: >60 mL/min (ref >60)
Glucose, Bld: 140 mg/dL — ABNORMAL HIGH (ref 70–99)
Potassium: 4.8 mmol/L (ref 3.5–5.1)
Sodium: 136 mmol/L (ref 135–145)

## 2020-09-10 LAB — CBC
HCT: 40.7 % (ref 39.0–52.0)
Hemoglobin: 13.7 g/dL (ref 13.0–17.0)
MCH: 30.6 pg (ref 26.0–34.0)
MCHC: 33.7 g/dL (ref 30.0–36.0)
MCV: 91.1 fL (ref 80.0–100.0)
Platelets: 227 10*3/uL (ref 150–400)
RBC: 4.47 MIL/uL (ref 4.22–5.81)
RDW: 13.1 % (ref 11.5–15.5)
WBC: 20 10*3/uL — ABNORMAL HIGH (ref 4.0–10.5)
nRBC: 0 % (ref 0.0–0.2)

## 2020-09-10 LAB — BLOOD GAS, VENOUS
Acid-Base Excess: 3.6 mmol/L — ABNORMAL HIGH (ref 0.0–2.0)
Bicarbonate: 30.5 mmol/L — ABNORMAL HIGH (ref 20.0–28.0)
Delivery systems: POSITIVE
FIO2: 30
O2 Saturation: 91.5 %
Patient temperature: 37
pCO2, Ven: 54 mmHg (ref 44.0–60.0)
pH, Ven: 7.36 (ref 7.250–7.430)
pO2, Ven: 65 mmHg — ABNORMAL HIGH (ref 32.0–45.0)

## 2020-09-10 MED ORDER — TAMSULOSIN HCL 0.4 MG PO CAPS
0.4000 mg | ORAL_CAPSULE | Freq: Every day | ORAL | Status: DC
  Administered 2020-09-10 – 2020-09-11 (×2): 0.4 mg via ORAL
  Filled 2020-09-10 (×2): qty 1

## 2020-09-10 MED ORDER — BENZONATATE 100 MG PO CAPS
200.0000 mg | ORAL_CAPSULE | Freq: Three times a day (TID) | ORAL | Status: DC | PRN
  Administered 2020-09-10 – 2020-09-11 (×2): 200 mg via ORAL
  Filled 2020-09-10 (×3): qty 2

## 2020-09-10 NOTE — Progress Notes (Signed)
PROGRESS NOTE    Bryan Kelley  DQQ:[id number] DOB: 05-18-1956 DOA: 09/09/2020 PCP: Marya Fossa, PA-C     Brief Narrative:  Bryan Kelley is a 64 y.o. male with medical history significant of COPD, chronic hypoxemic respiratory failure requiring 2 L nasal cannula O2 at baseline, asthma, hypertension, GERD, depression, anxiety, left eye blindness, BPH, alcohol abuse, tobacco abuse, previous COVID-19 infection, who presents with shortness of breath.  Pt states that he started having shortness breath, mild cough and wheezing since last night.  His shortness breath has been progressively worsening. He took took 4 breathing treatments with no significant relief. Pt was found to have oxygen desaturation to 80s on room air. He was placed on a nonrebreather and transported to the emergency room. Patient received 125 mg of IV Solu-Medrol in route. Patient arrives in severe respiratory distress and BiPAP is started. Pt denies chest pain, fever or chills.  No nausea, vomiting, diarrhea, abdominal pain, symptoms of UTI or unilateral weakness.  New events last 24 hours / Subjective: Patient was weaned off BiPAP overnight, weaned to nasal cannula O2.  States that his breathing has improved, although not back to his baseline.  Assessment & Plan:   Principal Problem:   COPD exacerbation (HCC) Active Problems:   GERD   Alcohol abuse   Acute on chronic respiratory failure with hypoxia (HCC)   Tobacco abuse   HTN (hypertension)   Acute on chronic hypoxemic respiratory failure secondary to COPD exacerbation -Uses 2 L nasal cannula O2 at baseline, continue oxygen to maintain oxygen saturation 88 to 92% -Continue Solu-Medrol, azithromycin, breathing treatments  GERD -PPI  Alcohol abuse -CIWA protocol  Tobacco abuse -Nicotine patch  Leukocytosis -Secondary to steroid use   DVT prophylaxis:  enoxaparin (LOVENOX) injection 40 mg Start: 09/09/20 0800  Code Status: Full code Family  Communication: No family at bedside Disposition Plan:  Status is: Inpatient  Remains inpatient appropriate because:IV treatments appropriate due to intensity of illness or inability to take PO   Dispo: The patient is from: Home              Anticipated d/c is to: Home              Anticipated d/c date is: 2 days              Patient currently is not medically stable to d/c.  His respiratory status is not back to baseline.  Continue IV Solu-Medrol.    Consultants:   None  Procedures:   None  Antimicrobials:  Anti-infectives (From admission, onward)   Start     Dose/Rate Route Frequency Ordered Stop   09/10/20 1000  azithromycin (ZITHROMAX) tablet 250 mg       "Followed by" Linked Group Details   250 mg Oral Daily 09/09/20 0715 09/14/20 0959   09/09/20 1000  azithromycin (ZITHROMAX) tablet 500 mg       "Followed by" Linked Group Details   500 mg Oral Daily 09/09/20 0715 09/09/20 0951   09/09/20 0545  cefTRIAXone (ROCEPHIN) 1 g in sodium chloride 0.9 % 100 mL IVPB        1 g 200 mL/hr over 30 Minutes Intravenous  Once 09/09/20 0538 09/09/20 0613        Objective: Vitals:   09/10/20 0403 09/10/20 0500 09/10/20 0750 09/10/20 0903  BP: (!) 146/84   (!) 151/88  Pulse: 78   76  Resp: 17     Temp: 98.1 F (36.7 C)  97.8 F (36.6 C)  TempSrc: Oral   Oral  SpO2: 98%  100%   Weight:  80.6 kg    Height:        Intake/Output Summary (Last 24 hours) at 09/10/2020 1129 Last data filed at 09/10/2020 1012 Gross per 24 hour  Intake 840 ml  Output 975 ml  Net -135 ml   Filed Weights   09/09/20 0459 09/10/20 0500  Weight: 78 kg 80.6 kg    Examination:  General exam: Appears calm and comfortable  Respiratory system: Diminished breath sounds bilaterally, respiratory effort is mildly labored with conversation Cardiovascular system: S1 & S2 heard, RRR. No murmurs. No pedal edema. Gastrointestinal system: Abdomen is nondistended, soft and nontender. Normal bowel sounds  heard. Central nervous system: Alert and oriented. No focal neurological deficits. Speech clear.  Extremities: Symmetric in appearance  Skin: No rashes, lesions or ulcers on exposed skin  Psychiatry: Judgement and insight appear normal. Mood & affect appropriate.   Data Reviewed: I have personally reviewed following labs and imaging studies  CBC: Recent Labs  Lab 09/09/20 0500 09/10/20 0426  WBC 10.5 20.0*  NEUTROABS 4.9  --   HGB 15.1 13.7  HCT 44.4 40.7  MCV 91.0 91.1  PLT 237 227   Basic Metabolic Panel: Recent Labs  Lab 09/09/20 0500 09/10/20 0426  NA 141 136  K 3.8 4.8  CL 101 100  CO2 28 27  GLUCOSE 129* 140*  BUN 21 23  CREATININE 0.91 0.76  CALCIUM 8.9 9.2   GFR: Estimated Creatinine Clearance: 94.2 mL/min (by C-G formula based on SCr of 0.76 mg/dL). Liver Function Tests: Recent Labs  Lab 09/09/20 0500  AST 29  ALT 27  ALKPHOS 86  BILITOT 0.9  PROT 7.7  ALBUMIN 4.4   No results for input(s): LIPASE, AMYLASE in the last 168 hours. No results for input(s): AMMONIA in the last 168 hours. Coagulation Profile: No results for input(s): INR, PROTIME in the last 168 hours. Cardiac Enzymes: No results for input(s): CKTOTAL, CKMB, CKMBINDEX, TROPONINI in the last 168 hours. BNP (last 3 results) No results for input(s): PROBNP in the last 8760 hours. HbA1C: No results for input(s): HGBA1C in the last 72 hours. CBG: No results for input(s): GLUCAP in the last 168 hours. Lipid Profile: No results for input(s): CHOL, HDL, LDLCALC, TRIG, CHOLHDL, LDLDIRECT in the last 72 hours. Thyroid Function Tests: No results for input(s): TSH, T4TOTAL, FREET4, T3FREE, THYROIDAB in the last 72 hours. Anemia Panel: No results for input(s): VITAMINB12, FOLATE, FERRITIN, TIBC, IRON, RETICCTPCT in the last 72 hours. Sepsis Labs: No results for input(s): PROCALCITON, LATICACIDVEN in the last 168 hours.  Recent Results (from the past 240 hour(s))  Resp Panel by RT PCR (RSV,  Flu A&B, Covid) - Nasopharyngeal Swab     Status: None   Collection Time: 09/09/20  5:00 AM   Specimen: Nasopharyngeal Swab  Result Value Ref Range Status   SARS Coronavirus 2 by RT PCR NEGATIVE NEGATIVE Final    Comment: (NOTE) SARS-CoV-2 target nucleic acids are NOT DETECTED.  The SARS-CoV-2 RNA is generally detectable in upper respiratoy specimens during the acute phase of infection. The lowest concentration of SARS-CoV-2 viral copies this assay can detect is 131 copies/mL. A negative result does not preclude SARS-Cov-2 infection and should not be used as the sole basis for treatment or other patient management decisions. A negative result may occur with  improper specimen collection/handling, submission of specimen other than nasopharyngeal swab, presence of viral  mutation(s) within the areas targeted by this assay, and inadequate number of viral copies (<131 copies/mL). A negative result must be combined with clinical observations, patient history, and epidemiological information. The expected result is Negative.  Fact Sheet for Patients:  https://www.moore.com/  Fact Sheet for Healthcare Providers:  https://www.young.biz/  This test is no t yet approved or cleared by the [place] FDA and  has been authorized for detection and/or diagnosis of SARS-CoV-2 by FDA under an Emergency Use Authorization (EUA). This EUA will remain  in effect (meaning this test can be used) for the duration of the COVID-19 declaration under Section 564(b)(1) of the Act, 21 U.S.C. section 360bbb-3(b)(1), unless the authorization is terminated or revoked sooner.     Influenza A by PCR NEGATIVE NEGATIVE Final   Influenza B by PCR NEGATIVE NEGATIVE Final    Comment: (NOTE) The Xpert Xpress SARS-CoV-2/FLU/RSV assay is intended as an aid in  the diagnosis of influenza from Nasopharyngeal swab specimens and  should not be used as a sole basis for treatment.  Nasal washings and  aspirates are unacceptable for Xpert Xpress SARS-CoV-2/FLU/RSV  testing.  Fact Sheet for Patients: https://www.moore.com/  Fact Sheet for Healthcare Providers: https://www.young.biz/  This test is not yet approved or cleared by the [place] FDA and  has been authorized for detection and/or diagnosis of SARS-CoV-2 by  FDA under an Emergency Use Authorization (EUA). This EUA will remain  in effect (meaning this test can be used) for the duration of the  Covid-19 declaration under Section 564(b)(1) of the Act, 21  U.S.C. section 360bbb-3(b)(1), unless the authorization is  terminated or revoked.    Respiratory Syncytial Virus by PCR NEGATIVE NEGATIVE Final    Comment: (NOTE) Fact Sheet for Patients: https://www.moore.com/  Fact Sheet for Healthcare Providers: https://www.young.biz/  This test is not yet approved or cleared by the [place] FDA and  has been authorized for detection and/or diagnosis of SARS-CoV-2 by  FDA under an Emergency Use Authorization (EUA). This EUA will remain  in effect (meaning this test can be used) for the duration of the  COVID-19 declaration under Section 564(b)(1) of the Act, 21 U.S.C.  section 360bbb-3(b)(1), unless the authorization is terminated or  revoked. Performed at [facility], [address] [place], [place] [address]   Culture, blood (routine x 2) Call MD if unable to obtain prior to antibiotics being given     Status: None (Preliminary result)   Collection Time: 09/09/20  5:49 PM   Specimen: BLOOD  Result Value Ref Range Status   Specimen Description BLOOD RAC  Final   Special Requests   Final    BOTTLES DRAWN AEROBIC AND ANAEROBIC Blood Culture results may not be optimal due to an excessive volume of blood received in culture bottles   Culture   Final    NO GROWTH < 12 HOURS Performed at [facility], [address], [place], [place] [address]    Report Status PENDING  Incomplete  Culture, blood (routine x 2) Call MD if unable to obtain prior to antibiotics being given     Status: None (Preliminary result)   Collection Time: 09/09/20  5:56 PM   Specimen: BLOOD  Result Value Ref Range Status   Specimen Description BLOOD LAC  Final   Special Requests   Final    BOTTLES DRAWN AEROBIC AND ANAEROBIC Blood Culture adequate volume   Culture   Final    NO GROWTH < 12 HOURS Performed at  [facility], [address], [place], [place] [address]    Report Status PENDING  Incomplete      Radiology Studies: DG Chest Portable 1 View  Result Date: 09/09/2020 CLINICAL DATA:  Shortness of breath. EXAM: PORTABLE CHEST 1 VIEW COMPARISON:  Prior chest radiographs 07/25/2020. FINDINGS: Heart size within normal limits. No appreciable airspace consolidation. Trace right pleural effusion versus pleural scarring, unchanged. No evidence of pneumothorax. No acute bony abnormality identified. Redemonstrated metallic BB projecting in the region of the right lower thorax. IMPRESSION: Trace right pleural effusion versus pleural scarring, unchanged. No appreciable airspace consolidation. Electronically Signed   By: Jackey Loge DO   On: 09/09/2020 06:07      Scheduled Meds: . azithromycin  250 mg Oral Daily  . enoxaparin (LOVENOX) injection  40 mg Subcutaneous Q24H  . fluticasone furoate-vilanterol  1 puff Inhalation Daily  . folic acid  1 mg Oral Daily  . ipratropium-albuterol  3 mL Nebulization Q4H  . LORazepam  0-4 mg Intravenous Q6H   Followed by  . [START ON 09/11/2020] LORazepam  0-4 mg Intravenous Q12H  . methylPREDNISolone (SOLU-MEDROL) injection  40 mg Intravenous Q12H  . multivitamin with minerals  1 tablet Oral Daily  . nicotine  21 mg Transdermal Daily  . thiamine  100 mg Oral Daily   Or  . thiamine  100 mg Intravenous Daily   Continuous Infusions:   LOS: 1 day      Time spent: 35  minutes   Noralee Stain, DO [facility] 09/10/2020, 11:29 AM   Available via Epic secure chat 7am-7pm After these hours, please refer to coverage provider listed on [email or web address]

## 2020-09-11 LAB — CBC
HCT: 41 % (ref 39.0–52.0)
Hemoglobin: 13.6 g/dL (ref 13.0–17.0)
MCH: 30.6 pg (ref 26.0–34.0)
MCHC: 33.2 g/dL (ref 30.0–36.0)
MCV: 92.1 fL (ref 80.0–100.0)
Platelets: 229 10*3/uL (ref 150–400)
RBC: 4.45 MIL/uL (ref 4.22–5.81)
RDW: 13.1 % (ref 11.5–15.5)
WBC: 18.4 10*3/uL — ABNORMAL HIGH (ref 4.0–10.5)
nRBC: 0 % (ref 0.0–0.2)

## 2020-09-11 MED ORDER — METHYLPREDNISOLONE SODIUM SUCC 40 MG IJ SOLR
40.0000 mg | Freq: Every day | INTRAMUSCULAR | Status: DC
  Administered 2020-09-12: 40 mg via INTRAVENOUS
  Filled 2020-09-11: qty 1

## 2020-09-11 MED ORDER — IPRATROPIUM-ALBUTEROL 0.5-2.5 (3) MG/3ML IN SOLN
3.0000 mL | Freq: Four times a day (QID) | RESPIRATORY_TRACT | Status: DC
  Administered 2020-09-11 – 2020-09-12 (×3): 3 mL via RESPIRATORY_TRACT
  Filled 2020-09-11 (×5): qty 3

## 2020-09-11 NOTE — Plan of Care (Signed)
  Problem: Education: Goal: Knowledge of disease or condition will improve Outcome: Progressing   Problem: Activity: Goal: Ability to tolerate increased activity will improve Outcome: Progressing   Problem: Respiratory: Goal: Ability to maintain a clear airway will improve Outcome: Progressing   Problem: Education: Goal: Knowledge of General Education information will improve Description: Including pain rating scale, medication(s)/side effects and non-pharmacologic comfort measures Outcome: Progressing   Problem: Clinical Measurements: Goal: Ability to maintain clinical measurements within normal limits will improve Outcome: Progressing Goal: Will remain free from infection Outcome: Progressing Goal: Diagnostic test results will improve Outcome: Progressing   Problem: Pain Managment: Goal: General experience of comfort will improve Outcome: Progressing   Problem: Safety: Goal: Ability to remain free from injury will improve Outcome: Progressing

## 2020-09-11 NOTE — Progress Notes (Signed)
PROGRESS NOTE    Bryan Kelley  ZOX:[id number] DOB: 06/10/1956 DOA: 09/09/2020 PCP: Marya FossaLeyva, Teresa, PA-C     Brief Narrative:  Bryan BirksWalter J Kelley is a 64 y.o. male with medical history significant of COPD, chronic hypoxemic respiratory failure requiring 2 L nasal cannula O2 at baseline, asthma, hypertension, GERD, depression, anxiety, left eye blindness, BPH, alcohol abuse, tobacco abuse, previous COVID-19 infection, who presents with shortness of breath.  Pt states that he started having shortness breath, mild cough and wheezing since last night.  His shortness breath has been progressively worsening. He took took 4 breathing treatments with no significant relief. Pt was found to have oxygen desaturation to 80s on room air. He was placed on a nonrebreather and transported to the emergency room. Patient received 125 mg of IV Solu-Medrol in route. Patient arrives in severe respiratory distress and BiPAP is started. Pt denies chest pain, fever or chills.  No nausea, vomiting, diarrhea, abdominal pain, symptoms of UTI or unilateral weakness.  Patient was admitted for acute COPD exacerbation.  New events last 24 hours / Subjective: Patient seen ambulating in the room and bathroom today.  Once he sat down, he appeared to be dyspneic.  Patient currently not at baseline respiratory status.  He does use 2 L nasal cannula O2 at baseline.  Assessment & Plan:   Principal Problem:   COPD exacerbation (HCC) Active Problems:   GERD   Alcohol abuse   Acute on chronic respiratory failure with hypoxia (HCC)   Tobacco abuse   HTN (hypertension)   Acute on chronic hypoxemic respiratory failure secondary to COPD exacerbation -Uses 2 L nasal cannula O2 at baseline, continue oxygen to maintain oxygen saturation 88 to 92% -Continue Solu-Medrol, azithromycin, breathing treatments  GERD -PPI  Alcohol abuse -CIWA protocol  Tobacco abuse -Nicotine patch  Leukocytosis -Secondary to steroid use   DVT  prophylaxis:  enoxaparin (LOVENOX) injection 40 mg Start: 09/09/20 0800  Code Status: Full code Family Communication: No family at bedside Disposition Plan:  Status is: Inpatient  Remains inpatient appropriate because:IV treatments appropriate due to intensity of illness or inability to take PO   Dispo: The patient is from: Home              Anticipated d/c is to: Home              Anticipated d/c date is: 1 day              Patient currently is not medically stable to d/c.  His respiratory status is not back to baseline.  Continue IV Solu-Medrol.    Consultants:   None  Procedures:   None  Antimicrobials:  Anti-infectives (From admission, onward)   Start     Dose/Rate Route Frequency Ordered Stop   09/10/20 1000  azithromycin (ZITHROMAX) tablet 250 mg       "Followed by" Linked Group Details   250 mg Oral Daily 09/09/20 0715 09/14/20 0959   09/09/20 1000  azithromycin (ZITHROMAX) tablet 500 mg       "Followed by" Linked Group Details   500 mg Oral Daily 09/09/20 0715 09/09/20 0951   09/09/20 0545  cefTRIAXone (ROCEPHIN) 1 g in sodium chloride 0.9 % 100 mL IVPB        1 g 200 mL/hr over 30 Minutes Intravenous  Once 09/09/20 0538 09/09/20 0613       Objective: Vitals:   09/11/20 0414 09/11/20 0800 09/11/20 0810 09/11/20 1141  BP:   (!) 139/92 Marland Kitchen(!)  149/86  Pulse:  74 75 68  Resp:  16 18 18   Temp:   98.4 F (36.9 C) 97.6 F (36.4 C)  TempSrc:    Oral  SpO2:  98% 100% 100%  Weight: 80.5 kg     Height:        Intake/Output Summary (Last 24 hours) at 09/11/2020 1142 Last data filed at 09/11/2020 0950 Gross per 24 hour  Intake 960 ml  Output 2 ml  Net 958 ml   Filed Weights   09/09/20 0459 09/10/20 0500 09/11/20 0414  Weight: 78 kg 80.6 kg 80.5 kg    Examination: General exam: Appears calm and comfortable  Respiratory system: Diminished breath sounds bilaterally, with increase in respiratory rate and dyspnea. Cardiovascular system: S1 & S2 heard, RRR. No  pedal edema. Gastrointestinal system: Abdomen is nondistended, soft and nontender. Normal bowel sounds heard. Central nervous system: Alert and oriented. Non focal exam. Speech clear  Extremities: Symmetric in appearance bilaterally  Skin: No rashes, lesions or ulcers on exposed skin  Psychiatry: Judgement and insight appear stable. Mood & affect appropriate.    Data Reviewed: I have personally reviewed following labs and imaging studies  CBC: Recent Labs  Lab 09/09/20 0500 09/10/20 0426 09/11/20 0418  WBC 10.5 20.0* 18.4*  NEUTROABS 4.9  --   --   HGB 15.1 13.7 13.6  HCT 44.4 40.7 41.0  MCV 91.0 91.1 92.1  PLT 237 227 229   Basic Metabolic Panel: Recent Labs  Lab 09/09/20 0500 09/10/20 0426  NA 141 136  K 3.8 4.8  CL 101 100  CO2 28 27  GLUCOSE 129* 140*  BUN 21 23  CREATININE 0.91 0.76  CALCIUM 8.9 9.2   GFR: Estimated Creatinine Clearance: 94.2 mL/min (by C-G formula based on SCr of 0.76 mg/dL). Liver Function Tests: Recent Labs  Lab 09/09/20 0500  AST 29  ALT 27  ALKPHOS 86  BILITOT 0.9  PROT 7.7  ALBUMIN 4.4   No results for input(s): LIPASE, AMYLASE in the last 168 hours. No results for input(s): AMMONIA in the last 168 hours. Coagulation Profile: No results for input(s): INR, PROTIME in the last 168 hours. Cardiac Enzymes: No results for input(s): CKTOTAL, CKMB, CKMBINDEX, TROPONINI in the last 168 hours. BNP (last 3 results) No results for input(s): PROBNP in the last 8760 hours. HbA1C: No results for input(s): HGBA1C in the last 72 hours. CBG: No results for input(s): GLUCAP in the last 168 hours. Lipid Profile: No results for input(s): CHOL, HDL, LDLCALC, TRIG, CHOLHDL, LDLDIRECT in the last 72 hours. Thyroid Function Tests: No results for input(s): TSH, T4TOTAL, FREET4, T3FREE, THYROIDAB in the last 72 hours. Anemia Panel: No results for input(s): VITAMINB12, FOLATE, FERRITIN, TIBC, IRON, RETICCTPCT in the last 72 hours. Sepsis Labs: No  results for input(s): PROCALCITON, LATICACIDVEN in the last 168 hours.  Recent Results (from the past 240 hour(s))  Resp Panel by RT PCR (RSV, Flu A&B, Covid) - Nasopharyngeal Swab     Status: None   Collection Time: 09/09/20  5:00 AM   Specimen: Nasopharyngeal Swab  Result Value Ref Range Status   SARS Coronavirus 2 by RT PCR NEGATIVE NEGATIVE Final    Comment: (NOTE) SARS-CoV-2 target nucleic acids are NOT DETECTED.  The SARS-CoV-2 RNA is generally detectable in upper respiratoy specimens during the acute phase of infection. The lowest concentration of SARS-CoV-2 viral copies this assay can detect is 131 copies/mL. A negative result does not preclude SARS-Cov-2 infection and should not  be used as the sole basis for treatment or other patient management decisions. A negative result may occur with  improper specimen collection/handling, submission of specimen other than nasopharyngeal swab, presence of viral mutation(s) within the areas targeted by this assay, and inadequate number of viral copies (<131 copies/mL). A negative result must be combined with clinical observations, patient history, and epidemiological information. The expected result is Negative.  Fact Sheet for Patients:  https://www.moore.com/  Fact Sheet for Healthcare Providers:  https://www.young.biz/  This test is no t yet approved or cleared by the [place] FDA and  has been authorized for detection and/or diagnosis of SARS-CoV-2 by FDA under an Emergency Use Authorization (EUA). This EUA will remain  in effect (meaning this test can be used) for the duration of the COVID-19 declaration under Section 564(b)(1) of the Act, 21 U.S.C. section 360bbb-3(b)(1), unless the authorization is terminated or revoked sooner.     Influenza A by PCR NEGATIVE NEGATIVE Final   Influenza B by PCR NEGATIVE NEGATIVE Final    Comment: (NOTE) The Xpert Xpress SARS-CoV-2/FLU/RSV assay  is intended as an aid in  the diagnosis of influenza from Nasopharyngeal swab specimens and  should not be used as a sole basis for treatment. Nasal washings and  aspirates are unacceptable for Xpert Xpress SARS-CoV-2/FLU/RSV  testing.  Fact Sheet for Patients: https://www.moore.com/  Fact Sheet for Healthcare Providers: https://www.young.biz/  This test is not yet approved or cleared by the [place] FDA and  has been authorized for detection and/or diagnosis of SARS-CoV-2 by  FDA under an Emergency Use Authorization (EUA). This EUA will remain  in effect (meaning this test can be used) for the duration of the  Covid-19 declaration under Section 564(b)(1) of the Act, 21  U.S.C. section 360bbb-3(b)(1), unless the authorization is  terminated or revoked.    Respiratory Syncytial Virus by PCR NEGATIVE NEGATIVE Final    Comment: (NOTE) Fact Sheet for Patients: https://www.moore.com/  Fact Sheet for Healthcare Providers: https://www.young.biz/  This test is not yet approved or cleared by the [place] FDA and  has been authorized for detection and/or diagnosis of SARS-CoV-2 by  FDA under an Emergency Use Authorization (EUA). This EUA will remain  in effect (meaning this test can be used) for the duration of the  COVID-19 declaration under Section 564(b)(1) of the Act, 21 U.S.C.  section 360bbb-3(b)(1), unless the authorization is terminated or  revoked. Performed at [facility], [address] [place], [place] [address]   Culture, blood (routine x 2) Call MD if unable to obtain prior to antibiotics being given     Status: None (Preliminary result)   Collection Time: 09/09/20  5:49 PM   Specimen: BLOOD  Result Value Ref Range Status   Specimen Description BLOOD RAC  Final   Special Requests   Final    BOTTLES DRAWN AEROBIC AND ANAEROBIC Blood Culture results may not be optimal due to  an excessive volume of blood received in culture bottles   Culture   Final    NO GROWTH 2 DAYS Performed at [facility], [address] [place], [place] [address]    Report Status PENDING  Incomplete  Culture, blood (routine x 2) Call MD if unable to obtain prior to antibiotics being given     Status: None (Preliminary result)   Collection Time: 09/09/20  5:56 PM   Specimen: BLOOD  Result Value Ref Range Status   Specimen Description BLOOD LAC  Final   Special  Requests   Final    BOTTLES DRAWN AEROBIC AND ANAEROBIC Blood Culture adequate volume   Culture   Final    NO GROWTH 2 DAYS Performed at [facility], [address] [place], [place] [address]    Report Status PENDING  Incomplete      Radiology Studies: No results found.    Scheduled Meds: . azithromycin  250 mg Oral Daily  . enoxaparin (LOVENOX) injection  40 mg Subcutaneous Q24H  . fluticasone furoate-vilanterol  1 puff Inhalation Daily  . folic acid  1 mg Oral Daily  . ipratropium-albuterol  3 mL Nebulization Q6H  . LORazepam  0-4 mg Intravenous Q12H  . methylPREDNISolone (SOLU-MEDROL) injection  40 mg Intravenous Q12H  . multivitamin with minerals  1 tablet Oral Daily  . nicotine  21 mg Transdermal Daily  . tamsulosin  0.4 mg Oral QPC supper  . thiamine  100 mg Oral Daily   Or  . thiamine  100 mg Intravenous Daily   Continuous Infusions:   LOS: 2 days      Time spent: 25 minutes   Noralee Stain, DO [facility] 09/11/2020, 11:42 AM   Available via Epic secure chat 7am-7pm After these hours, please refer to coverage provider listed on [email or web address]

## 2020-09-12 LAB — CBC
HCT: 42.4 % (ref 39.0–52.0)
Hemoglobin: 14.2 g/dL (ref 13.0–17.0)
MCH: 31 pg (ref 26.0–34.0)
MCHC: 33.5 g/dL (ref 30.0–36.0)
MCV: 92.6 fL (ref 80.0–100.0)
Platelets: 215 10*3/uL (ref 150–400)
RBC: 4.58 MIL/uL (ref 4.22–5.81)
RDW: 13.2 % (ref 11.5–15.5)
WBC: 14.4 10*3/uL — ABNORMAL HIGH (ref 4.0–10.5)
nRBC: 0 % (ref 0.0–0.2)

## 2020-09-12 MED ORDER — DULERA 200-5 MCG/ACT IN AERO
2.0000 | INHALATION_SPRAY | Freq: Two times a day (BID) | RESPIRATORY_TRACT | 1 refills | Status: DC
Start: 2020-09-12 — End: 2021-02-22

## 2020-09-12 MED ORDER — PREDNISONE 50 MG PO TABS: 50.0000 mg | ORAL_TABLET | Freq: Every day | ORAL | 0 refills | Status: DC

## 2020-09-12 MED ORDER — PREDNISONE 50 MG PO TABS: 50.0000 mg | ORAL_TABLET | Freq: Every day | ORAL | 0 refills | Status: AC

## 2020-09-12 MED ORDER — DULERA 200-5 MCG/ACT IN AERO: 2.0000 | INHALATION_SPRAY | Freq: Two times a day (BID) | RESPIRATORY_TRACT | 1 refills | Status: DC

## 2020-09-12 NOTE — Discharge Summary (Signed)
Physician Discharge Summary  Bryan Kelley:[id number] DOB: 03-Mar-1956 DOA: 09/09/2020  PCP: Marya Fossa, PA-C  Admit date: 09/09/2020 Discharge date: 09/12/2020  Admitted From: Home Disposition: Home  Recommendations for Outpatient Follow-up:  1. Follow ups as below. 2. Please obtain CBC/BMP/Mag at follow up 3. Please follow up on the following pending results: None  Home Health: None required Equipment/Devices: None required  Discharge Condition: Stable CODE STATUS: Full code   Hospital Course: 64 y.o.malewith medical history significant ofCOPD, chronic hypoxemic respiratory failure requiring 2 L nasal cannula O2 at baseline, asthma, hypertension, GERD, depression, anxiety, left eye blindness, BPH, alcohol abuse, tobacco abuse, previous COVID-19 infection, who presents with shortness of breath.  Pt states thathe started having shortness breath, mild cough and wheezing since last night. His shortness breath has been progressively worsening. He tooktook 4 breathing treatments with no significant relief. Pt was found to haveoxygen desaturation to 80s on room air.He was placed on a nonrebreather and transported to the emergency room. Patient received 125 mg of IV Solu-Medrol in route. Patient arrives in severe respiratory distressand BiPAP is started. Pt denieschest pain, fever or chills.No nausea, vomiting, diarrhea, abdominal pain, symptoms of UTI or unilateral weakness.  Patient was admitted for acute COPD exacerbation.  Patient admitted for acute on chronic respiratory failure in the setting of COPD exacerbation, and treated with steroid, antibiotics and breathing treatment with improvement in his symptoms.  On the day of discharge, patient was ambulated on home 2 L maintain appropriate saturation.  Discharged on p.o. prednisone 50 mg daily for 2 more days, and home Dulera and Spiriva.  He was encouraged to quit smoking.  Provided with resources.    See individual  problem list below for more on hospital course.  Discharge Diagnoses:  Acute on chronic hypoxemic respiratory failure secondary to COPD exacerbation -P.o. prednisone 50 mg for 2 more days -Continue Dulera and Spiriva -Encouraged to quit smoking. He is confident and determined.  He says he has nicotine lives at home.  Alcohol abuse: Male withdrawal symptoms.  -Encourage moderation.  Tobacco abuse -Cessation counseling and nicotine patch. -Nicotine patch  Leukocytosis: Likely demargination from steroid. -Recheck CBC at follow-up.  Body mass index is 28.63 kg/m.            Discharge Exam: Vitals:   09/12/20 0718 09/12/20 0719  BP:  (!) 142/92  Pulse:  72  Resp:  18  Temp:  97.9 F (36.6 C)  SpO2: (!) 88% 97%    GENERAL: No apparent distress.  Nontoxic. HEENT: MMM.  Vision and hearing grossly intact.  NECK: Supple.  No apparent JVD.  RESP:  No IWOB.  Fair aeration bilaterally. CVS:  RRR. Heart sounds normal.  ABD/GI/GU: Bowel sounds present. Soft. Non tender.  MSK/EXT:  Moves extremities. No apparent deformity. No edema.  SKIN: no apparent skin lesion or wound NEURO: Awake, alert and oriented appropriately.  No apparent focal neuro deficit. PSYCH: Calm. Normal affect.  Discharge Instructions  Discharge Instructions    Call MD for:  difficulty breathing, headache or visual disturbances   Complete by: As directed    Call MD for:  extreme fatigue   Complete by: As directed    Call MD for:  persistant dizziness or light-headedness   Complete by: As directed    Diet general   Complete by: As directed    Discharge instructions   Complete by: As directed    It has been a pleasure taking care of you!  You  were hospitalized due to COPD exacerbation.  Your symptoms improved with treatment to the point we think it is safe to let you go home and follow-up with your primary care doctor.  Please take your inhalers and the other medications as prescribed.   It is  important that you quit smoking cigarettes.  You may use nicotine patch to help you quit smoking.  Nicotine patch is available over-the-counter.  You may also discuss other options to help you quit smoking with your primary care doctor. You can also talk to professional counselors at 1-800-QUIT-NOW (254)778-7803) for free smoking cessation counseling.     Take care,   Increase activity slowly   Complete by: As directed      Allergies as of 09/12/2020   No Known Allergies     Medication List    TAKE these medications   albuterol 108 (90 Base) MCG/ACT inhaler Commonly known as: VENTOLIN HFA Inhale 2 puffs into the lungs every 6 (six) hours as needed for wheezing or shortness of breath.   albuterol (2.5 MG/3ML) 0.083% nebulizer solution Commonly known as: PROVENTIL Take 2.5 mg by nebulization every 4 (four) hours as needed for wheezing.   Dulera 200-5 MCG/ACT Aero Generic drug: mometasone-formoterol Inhale 2 puffs into the lungs 2 (two) times daily.   guaiFENesin-codeine 100-10 MG/5ML syrup Take 10 mLs by mouth 3 (three) times daily as needed (nighttime cough).   nicotine 21 mg/24hr patch Commonly known as: NICODERM CQ - dosed in mg/24 hours Place 1 patch (21 mg total) onto the skin daily.   predniSONE 50 MG tablet Commonly known as: DELTASONE Take 1 tablet (50 mg total) by mouth daily with breakfast for 2 days.   Spiriva Respimat 2.5 MCG/ACT Aers Generic drug: Tiotropium Bromide Monohydrate Inhale 2 puffs into the lungs daily. What changed: Another medication with the same name was removed. Continue taking this medication, and follow the directions you see here.   Viagra 50 MG tablet Generic drug: sildenafil Take 50 mg by mouth as needed for erectile dysfunction.       Consultations:  None  Procedures/Studies:   DG Chest Portable 1 View  Result Date: 09/09/2020 CLINICAL DATA:  Shortness of breath. EXAM: PORTABLE CHEST 1 VIEW COMPARISON:  Prior chest  radiographs 07/25/2020. FINDINGS: Heart size within normal limits. No appreciable airspace consolidation. Trace right pleural effusion versus pleural scarring, unchanged. No evidence of pneumothorax. No acute bony abnormality identified. Redemonstrated metallic BB projecting in the region of the right lower thorax. IMPRESSION: Trace right pleural effusion versus pleural scarring, unchanged. No appreciable airspace consolidation. Electronically Signed   By: Jackey Loge DO   On: 09/09/2020 06:07        The results of significant diagnostics from this hospitalization (including imaging, microbiology, ancillary and laboratory) are listed below for reference.     Microbiology: Recent Results (from the past 240 hour(s))  Resp Panel by RT PCR (RSV, Flu A&B, Covid) - Nasopharyngeal Swab     Status: None   Collection Time: 09/09/20  5:00 AM   Specimen: Nasopharyngeal Swab  Result Value Ref Range Status   SARS Coronavirus 2 by RT PCR NEGATIVE NEGATIVE Final    Comment: (NOTE) SARS-CoV-2 target nucleic acids are NOT DETECTED.  The SARS-CoV-2 RNA is generally detectable in upper respiratoy specimens during the acute phase of infection. The lowest concentration of SARS-CoV-2 viral copies this assay can detect is 131 copies/mL. A negative result does not preclude SARS-Cov-2 infection and should not be used as the  sole basis for treatment or other patient management decisions. A negative result may occur with  improper specimen collection/handling, submission of specimen other than nasopharyngeal swab, presence of viral mutation(s) within the areas targeted by this assay, and inadequate number of viral copies (<131 copies/mL). A negative result must be combined with clinical observations, patient history, and epidemiological information. The expected result is Negative.  Fact Sheet for Patients:  https://www.moore.com/  Fact Sheet for Healthcare Providers:   https://www.young.biz/  This test is no t yet approved or cleared by the [place] FDA and  has been authorized for detection and/or diagnosis of SARS-CoV-2 by FDA under an Emergency Use Authorization (EUA). This EUA will remain  in effect (meaning this test can be used) for the duration of the COVID-19 declaration under Section 564(b)(1) of the Act, 21 U.S.C. section 360bbb-3(b)(1), unless the authorization is terminated or revoked sooner.     Influenza A by PCR NEGATIVE NEGATIVE Final   Influenza B by PCR NEGATIVE NEGATIVE Final    Comment: (NOTE) The Xpert Xpress SARS-CoV-2/FLU/RSV assay is intended as an aid in  the diagnosis of influenza from Nasopharyngeal swab specimens and  should not be used as a sole basis for treatment. Nasal washings and  aspirates are unacceptable for Xpert Xpress SARS-CoV-2/FLU/RSV  testing.  Fact Sheet for Patients: https://www.moore.com/  Fact Sheet for Healthcare Providers: https://www.young.biz/  This test is not yet approved or cleared by the [place] FDA and  has been authorized for detection and/or diagnosis of SARS-CoV-2 by  FDA under an Emergency Use Authorization (EUA). This EUA will remain  in effect (meaning this test can be used) for the duration of the  Covid-19 declaration under Section 564(b)(1) of the Act, 21  U.S.C. section 360bbb-3(b)(1), unless the authorization is  terminated or revoked.    Respiratory Syncytial Virus by PCR NEGATIVE NEGATIVE Final    Comment: (NOTE) Fact Sheet for Patients: https://www.moore.com/  Fact Sheet for Healthcare Providers: https://www.young.biz/  This test is not yet approved or cleared by the [place] FDA and  has been authorized for detection and/or diagnosis of SARS-CoV-2 by  FDA under an Emergency Use Authorization (EUA). This EUA will remain  in effect (meaning this test can be  used) for the duration of the  COVID-19 declaration under Section 564(b)(1) of the Act, 21 U.S.C.  section 360bbb-3(b)(1), unless the authorization is terminated or  revoked. Performed at [facility], [address] [place], [place] [address]   Culture, blood (routine x 2) Call MD if unable to obtain prior to antibiotics being given     Status: None (Preliminary result)   Collection Time: 09/09/20  5:49 PM   Specimen: BLOOD  Result Value Ref Range Status   Specimen Description BLOOD RAC  Final   Special Requests   Final    BOTTLES DRAWN AEROBIC AND ANAEROBIC Blood Culture results may not be optimal due to an excessive volume of blood received in culture bottles   Culture   Final    NO GROWTH 3 DAYS Performed at [facility], [address] [place], [place] [address]    Report Status PENDING  Incomplete  Culture, blood (routine x 2) Call MD if unable to obtain prior to antibiotics being given     Status: None (Preliminary result)   Collection Time: 09/09/20  5:56 PM   Specimen: BLOOD  Result Value Ref Range Status   Specimen Description BLOOD LAC  Final   Special Requests   Final  BOTTLES DRAWN AEROBIC AND ANAEROBIC Blood Culture adequate volume   Culture   Final    NO GROWTH 3 DAYS Performed at [facility], [address] [place], [place] [id number]    Report Status PENDING  Incomplete     Labs: BNP (last 3 results) Recent Labs    12/06/19 1724 07/25/20 0827 09/09/20 0500  BNP 18.0 34.2 37.4   Basic Metabolic Panel: Recent Labs  Lab 09/09/20 0500 09/10/20 0426  NA 141 136  K 3.8 4.8  CL 101 100  CO2 28 27  GLUCOSE 129* 140*  BUN 21 23  CREATININE 0.91 0.76  CALCIUM 8.9 9.2   Liver Function Tests: Recent Labs  Lab 09/09/20 0500  AST 29  ALT 27  ALKPHOS 86  BILITOT 0.9  PROT 7.7  ALBUMIN 4.4   No results for input(s): LIPASE, AMYLASE in the last 168 hours. No results for input(s): AMMONIA in the last 168  hours. CBC: Recent Labs  Lab 09/09/20 0500 09/10/20 0426 09/11/20 0418 09/12/20 0541  WBC 10.5 20.0* 18.4* 14.4*  NEUTROABS 4.9  --   --   --   HGB 15.1 13.7 13.6 14.2  HCT 44.4 40.7 41.0 42.4  MCV 91.0 91.1 92.1 92.6  PLT 237 227 229 215   Cardiac Enzymes: No results for input(s): CKTOTAL, CKMB, CKMBINDEX, TROPONINI in the last 168 hours. BNP: Invalid input(s): POCBNP CBG: No results for input(s): GLUCAP in the last 168 hours. D-Dimer No results for input(s): DDIMER in the last 72 hours. Hgb A1c No results for input(s): HGBA1C in the last 72 hours. Lipid Profile No results for input(s): CHOL, HDL, LDLCALC, TRIG, CHOLHDL, LDLDIRECT in the last 72 hours. Thyroid function studies No results for input(s): TSH, T4TOTAL, T3FREE, THYROIDAB in the last 72 hours.  Invalid input(s): FREET3 Anemia work up No results for input(s): VITAMINB12, FOLATE, FERRITIN, TIBC, IRON, RETICCTPCT in the last 72 hours. Urinalysis No results found for: COLORURINE, APPEARANCEUR, LABSPEC, PHURINE, GLUCOSEU, HGBUR, BILIRUBINUR, KETONESUR, PROTEINUR, UROBILINOGEN, NITRITE, LEUKOCYTESUR Sepsis Labs Invalid input(s): PROCALCITONIN,  WBC,  LACTICIDVEN   Time coordinating discharge: 35 minutes  SIGNED:  Almon Herculesaye T Apple Dearmas, MD  [facility] 09/12/2020, 9:39 AM  If 7PM-7AM, please contact night-coverage [email or web address]

## 2020-09-12 NOTE — Progress Notes (Addendum)
Pt ambulated on room air, o2 sats at 91%, tolerated well. Will continue to monitor.

## 2020-09-12 NOTE — Progress Notes (Signed)
Discharge instructions explained/pt verbalized understanding. IV removed. Transferred off unit via wheelchair.

## 2020-09-14 LAB — CULTURE, BLOOD (ROUTINE X 2)
Culture: NO GROWTH
Culture: NO GROWTH
Special Requests: ADEQUATE

## 2020-10-02 ENCOUNTER — Ambulatory Visit: Payer: Medicaid Other | Admitting: Pulmonary Disease

## 2020-10-05 ENCOUNTER — Emergency Department: Payer: Medicaid Other

## 2020-10-05 ENCOUNTER — Encounter: Payer: Self-pay | Admitting: Emergency Medicine

## 2020-10-05 ENCOUNTER — Other Ambulatory Visit: Payer: Self-pay

## 2020-10-05 ENCOUNTER — Inpatient Hospital Stay
Admission: EM | Admit: 2020-10-05 | Discharge: 2020-10-07 | DRG: 190 | Disposition: A | Discharge status: Home / Self Care | Payer: Medicaid Other | Attending: Internal Medicine | Admitting: Internal Medicine

## 2020-10-05 DIAGNOSIS — Z825 Family history of asthma and other chronic lower respiratory diseases: Secondary | ICD-10-CM

## 2020-10-05 DIAGNOSIS — I160 Hypertensive urgency: Secondary | ICD-10-CM | POA: Diagnosis present

## 2020-10-05 DIAGNOSIS — J209 Acute bronchitis, unspecified: Secondary | ICD-10-CM | POA: Diagnosis present

## 2020-10-05 DIAGNOSIS — J441 Chronic obstructive pulmonary disease with (acute) exacerbation: Principal | ICD-10-CM | POA: Diagnosis present

## 2020-10-05 DIAGNOSIS — Z7951 Long term (current) use of inhaled steroids: Secondary | ICD-10-CM

## 2020-10-05 DIAGNOSIS — Z20822 Contact with and (suspected) exposure to covid-19: Secondary | ICD-10-CM | POA: Diagnosis present

## 2020-10-05 DIAGNOSIS — J9621 Acute and chronic respiratory failure with hypoxia: Secondary | ICD-10-CM | POA: Diagnosis present

## 2020-10-05 DIAGNOSIS — R0602 Shortness of breath: Secondary | ICD-10-CM

## 2020-10-05 DIAGNOSIS — F17210 Nicotine dependence, cigarettes, uncomplicated: Secondary | ICD-10-CM | POA: Diagnosis present

## 2020-10-05 DIAGNOSIS — Z79899 Other long term (current) drug therapy: Secondary | ICD-10-CM

## 2020-10-05 DIAGNOSIS — H548 Legal blindness, as defined in USA: Secondary | ICD-10-CM | POA: Diagnosis present

## 2020-10-05 DIAGNOSIS — Z9981 Dependence on supplemental oxygen: Secondary | ICD-10-CM

## 2020-10-05 DIAGNOSIS — R0902 Hypoxemia: Secondary | ICD-10-CM

## 2020-10-05 DIAGNOSIS — J9600 Acute respiratory failure, unspecified whether with hypoxia or hypercapnia: Secondary | ICD-10-CM | POA: Diagnosis present

## 2020-10-05 DIAGNOSIS — F1010 Alcohol abuse, uncomplicated: Secondary | ICD-10-CM | POA: Diagnosis present

## 2020-10-05 DIAGNOSIS — K219 Gastro-esophageal reflux disease without esophagitis: Secondary | ICD-10-CM | POA: Diagnosis present

## 2020-10-05 LAB — BASIC METABOLIC PANEL
Anion gap: 11 (ref 5–15)
BUN: 15 mg/dL (ref 8–23)
CO2: 28 mmol/L (ref 22–32)
Calcium: 9.1 mg/dL (ref 8.9–10.3)
Chloride: 100 mmol/L (ref 98–111)
Creatinine, Ser: 0.8 mg/dL (ref 0.61–1.24)
GFR, Estimated: >60 mL/min (ref >60)
Glucose, Bld: 136 mg/dL — ABNORMAL HIGH (ref 70–99)
Potassium: 3.6 mmol/L (ref 3.5–5.1)
Sodium: 139 mmol/L (ref 135–145)

## 2020-10-05 LAB — CBC
HCT: 43.8 % (ref 39.0–52.0)
Hemoglobin: 14.6 g/dL (ref 13.0–17.0)
MCH: 30.7 pg (ref 26.0–34.0)
MCHC: 33.3 g/dL (ref 30.0–36.0)
MCV: 92 fL (ref 80.0–100.0)
Platelets: 227 10*3/uL (ref 150–400)
RBC: 4.76 MIL/uL (ref 4.22–5.81)
RDW: 13.2 % (ref 11.5–15.5)
WBC: 7.4 10*3/uL (ref 4.0–10.5)
nRBC: 0 % (ref 0.0–0.2)

## 2020-10-05 LAB — TROPONIN I (HIGH SENSITIVITY): Troponin I (High Sensitivity): 17 ng/L (ref <18)

## 2020-10-05 MED ORDER — NITROGLYCERIN 2 % TD OINT
1.0000 [in_us] | TOPICAL_OINTMENT | Freq: Once | TRANSDERMAL | Status: AC
  Administered 2020-10-05: 1 [in_us] via TOPICAL
  Filled 2020-10-05: qty 1

## 2020-10-05 MED ORDER — IPRATROPIUM-ALBUTEROL 0.5-2.5 (3) MG/3ML IN SOLN
RESPIRATORY_TRACT | Status: AC
  Administered 2020-10-05: 3 mL
  Filled 2020-10-05: qty 6

## 2020-10-05 NOTE — ED Triage Notes (Signed)
Patient brought in be ems from home with shortness of breath that started today. Patient initial saturation was 80% on his home nebulizer. Patient was given 2 duonebs, 125 mg Soulmedrol and 2 mg mag by ems. Patient's saturation was 96% with the duoneb.

## 2020-10-05 NOTE — Progress Notes (Addendum)
Pt was placed on Bipap 12/6 40% with aerogen inline and given 2 duonebs. BBS are dec with exp wheezes pt is using accessory muscles to breath  Pt was increased to 16/8 for WOB

## 2020-10-05 NOTE — ED Notes (Signed)
BIPAP placed by RT

## 2020-10-05 NOTE — ED Provider Notes (Signed)
[facility] Emergency Department Provider Note   ____________________________________________   First MD Initiated Contact with Patient 10/05/20 2308     (approximate)  I have reviewed the triage vital signs and the nursing notes.   HISTORY  Chief Complaint Shortness of Breath    HPI Bryan Kelley is a 64 y.o. male brought to the ED via EMS from home with a chief complaint of shortness of breath.  Patient has a history of COPD on 2 L continuous oxygen, GERD, alcohol abuse, who began experiencing increased shortness of breath last evening.  EMS reports saturations in the mid 80% on their arrival.  Patient received 2 duo nebs, 125 mg IV Solu-Medrol and IV magnesium prior to arrival.  Patient denies fever, abdominal pain, nausea, vomiting or diarrhea.  Endorses dry cough and chest tightness.     Past Medical History:  Diagnosis Date  . Asthma   . COPD (chronic obstructive pulmonary disease) (HCC)   . Depression   . GERD (gastroesophageal reflux disease)   . Vision loss, left eye     Patient Active Problem List   Diagnosis Date Noted  . Acute respiratory failure (HCC) 10/06/2020  . Tobacco abuse 07/25/2020  . HTN (hypertension) 07/25/2020  . Acute on chronic respiratory failure with hypoxia (HCC) 12/06/2019  . Nausea vomiting and diarrhea 12/06/2019  . Acute respiratory disease due to COVID-19 virus 12/06/2019  . COPD (chronic obstructive pulmonary disease) (HCC) 10/30/2019  . Hyperglycemia   . COPD with acute exacerbation (HCC) 10/29/2019  . COPD exacerbation (HCC) 10/29/2019  . Epigastric abdominal pain 08/12/2012  . Alcohol abuse 03/14/2012  . Elevated BP 12/07/2011  . ED (erectile dysfunction) 07/28/2011  . NICOTINE ADDICTION 10/29/2009  . ACUTE BRONCHITIS 05/07/2009  . 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 ANKLE PAIN, RIGHT 10/04/2008  . FATIGUE 10/04/2008  . ANXIETY 02/17/2008  . ASTHMA 02/17/2008  . GERD 02/17/2008  . BPH  (benign prostatic hyperplasia) 02/17/2008    Past Surgical History:  Procedure Laterality Date  . EYE SURGERY  2009, about 3299,2426   steel in left eye on the job, legally blind     Prior to Admission medications   Medication Sig Start Date End Date Taking? Authorizing Provider  albuterol (PROVENTIL) (2.5 MG/3ML) 0.083% nebulizer solution Take 2.5 mg by nebulization every 4 (four) hours as needed for wheezing. 06/06/20  Yes [provider]  DULERA 200-5 MCG/ACT AERO Inhale 2 puffs into the lungs 2 (two) times daily. 09/12/20  Yes Almon Hercules, MD  montelukast (SINGULAIR) 10 MG tablet Take 10 mg by mouth daily. 09/25/20  Yes [provider]  Tiotropium Bromide Monohydrate (SPIRIVA RESPIMAT) 2.5 MCG/ACT AERS Inhale 2 puffs into the lungs daily. 07/30/20  Yes Glenford Bayley, NP  albuterol (VENTOLIN HFA) 108 (90 Base) MCG/ACT inhaler Inhale 2 puffs into the lungs every 6 (six) hours as needed for wheezing or shortness of breath. Patient not taking: Reported on 09/09/2020 11/30/19   Nita Sickle, MD  guaiFENesin-codeine 100-10 MG/5ML syrup Take 10 mLs by mouth 3 (three) times daily as needed (nighttime cough). Patient not taking: Reported on 09/09/2020 07/28/20   Esaw Grandchild A, DO  nicotine (NICODERM CQ - DOSED IN MG/24 HOURS) 21 mg/24hr patch Place 1 patch (21 mg total) onto the skin daily. Patient not taking: Reported on 09/09/2020 07/29/20   Esaw Grandchild A, DO  VIAGRA 50 MG tablet Take 50 mg by mouth as needed for erectile dysfunction. Patient not taking: Reported on  09/09/2020 03/03/20   [provider]    Allergies Patient has no known allergies.  Family History  Problem Relation Age of Onset  . Diabetes Mother   . Hypertension Mother   . Stroke Mother   . Asthma Father   . Diabetes Father   . Diabetes Sister   . Hypertension Sister   . Diabetes Brother   . Hypertension Brother     Social History Social History   Tobacco Use  . Smoking  status: Current Some Day Smoker    Packs/day: 0.50    Years: 40.00    Pack years: 20.00    Types: Cigarettes    Last attempt to quit: 2020    Years since quitting: 1.9  . Smokeless tobacco: Never Used  Vaping Use  . Vaping Use: Never used  Substance Use Topics  . Alcohol use: Yes  . Drug use: No    Review of Systems  Constitutional: No fever/chills Eyes: No visual changes. ENT: No sore throat. Cardiovascular: Positive for chest pain. Respiratory: Positive for cough and shortness of breath. Gastrointestinal: No abdominal pain.  No nausea, no vomiting.  No diarrhea.  No constipation. Genitourinary: Negative for dysuria. Musculoskeletal: Negative for back pain. Skin: Negative for rash. Neurological: Negative for headaches, focal weakness or numbness.   ____________________________________________   PHYSICAL EXAM:  VITAL SIGNS: ED Triage Vitals  Enc Vitals Group     BP 10/05/20 2248 (!) 159/111     Pulse Rate 10/05/20 2248 91     Resp 10/05/20 2248 (!) 26     Temp 10/05/20 2248 97.6 F (36.4 C)     Temp Source 10/05/20 2248 Oral     SpO2 10/05/20 2248 99 %     Weight 10/05/20 2244 180 lb (81.6 kg)     Height 10/05/20 2244 5\' 6"  (1.676 m)     Head Circumference --      Peak Flow --      Pain Score 10/05/20 2244 0     Pain Loc --      Pain Edu? --      Excl. in GC? --     Constitutional: Alert and oriented.  Ill appearing and in moderate acute distress. Eyes: Conjunctivae are normal. PERRL. EOMI. Head: Atraumatic. Nose: No congestion/rhinnorhea. Mouth/Throat: Mucous membranes are moist.   Neck: No stridor.   Cardiovascular: Normal rate, regular rhythm. Grossly normal heart sounds.  Good peripheral circulation. Respiratory: Increased respiratory effort.  Retractions. Lungs diminished bibasilarly. Gastrointestinal: Soft and nontender. No distention. No abdominal bruits. No CVA tenderness. Musculoskeletal: No lower extremity tenderness nor edema.  No joint  effusions. Neurologic:  Normal speech and language. No gross focal neurologic deficits are appreciated.  Skin:  Skin is warm, dry and intact. No rash noted. Psychiatric: Mood and affect are normal. Speech and behavior are normal.  ____________________________________________   LABS (all labs ordered are listed, but only abnormal results are displayed)  Labs Reviewed  BASIC METABOLIC PANEL - Abnormal; Notable for the following components:      Result Value   Glucose, Bld 136 (*)    All other components within normal limits  RESP PANEL BY RT-PCR (FLU A&B, COVID) ARPGX2  CULTURE, BLOOD (ROUTINE X 2)  CULTURE, BLOOD (ROUTINE X 2)  EXPECTORATED SPUTUM ASSESSMENT W REFEX TO RESP CULTURE  CBC  BASIC METABOLIC PANEL  CBC  BLOOD GAS, ARTERIAL  TROPONIN I (HIGH SENSITIVITY)  TROPONIN I (HIGH SENSITIVITY)   ____________________________________________  EKG  ED ECG REPORT  I, Irean HongSUNG,Tayveon Lombardo J, the attending physician, personally viewed and interpreted this ECG.   Date: 10/06/2020  EKG Time: 2244  Rate: 95  Rhythm: normal EKG, normal sinus rhythm  Axis: Normal  Intervals:none  ST&T Change: Nonspecific  ____________________________________________  RADIOLOGY I, Merridy Pascoe J, personally viewed and evaluated these images (plain radiographs) as part of my medical decision making, as well as reviewing the written report by the radiologist.  ED MD interpretation: No acute cardiopulmonary process  Official radiology report(s): DG Chest Portable 1 View  Result Date: 10/05/2020 CLINICAL DATA:  Shortness of breath EXAM: PORTABLE CHEST 1 VIEW COMPARISON:  None. FINDINGS: The heart size and mediastinal contours are within normal limits. Both lungs are clear. The visualized skeletal structures are unremarkable. IMPRESSION: No active disease. Electronically Signed   By: Jonna ClarkBindu  Avutu M.D.   On: 10/05/2020 23:25    ____________________________________________   PROCEDURES  Procedure(s)  performed (including Critical Care):  .1-3 Lead EKG Interpretation Performed by: Irean HongSung, Varun Jourdan J, MD Authorized by: Irean HongSung, Sheana Bir J, MD     Interpretation: normal     ECG rate:  90   ECG rate assessment: normal     Rhythm: sinus rhythm     Ectopy: none     Conduction: normal   Comments:     Patient placed on cardiac monitor to monitor for arrthymias   CRITICAL CARE Performed by: Irean HongSUNG,Sritha Chauncey J   Total critical care time: 30 minutes  Critical care time was exclusive of separately billable procedures and treating other patients.  Critical care was necessary to treat or prevent imminent or life-threatening deterioration.  Critical care was time spent personally by me on the following activities: development of treatment plan with patient and/or surrogate as well as nursing, discussions with consultants, evaluation of patient's response to treatment, examination of patient, obtaining history from patient or surrogate, ordering and performing treatments and interventions, ordering and review of laboratory studies, ordering and review of radiographic studies, pulse oximetry and re-evaluation of patient's condition.   ____________________________________________   INITIAL IMPRESSION / ASSESSMENT AND PLAN / ED COURSE  As part of my medical decision making, I reviewed the following data within the electronic MEDICAL RECORD NUMBER Nursing notes reviewed and incorporated, Labs reviewed, EKG interpreted, Old chart reviewed, Radiograph reviewed, Discussed with admitting physician and Notes from prior ED visits (COPD visits)     64 year old male with COPD presenting with exacerbation. Differential includes, but is not limited to, viral syndrome, bronchitis including COPD exacerbation, pneumonia, reactive airway disease including asthma, CHF including exacerbation with or without pulmonary/interstitial edema, pneumothorax, ACS, thoracic trauma, and pulmonary embolism.  Patient was placed on BiPAP upon his  arrival to the ED and received 2 additional DuoNeb's.  By the time of my examination patient was improving.  Remains hypertensive.  Will apply nitroglycerin paste.  Will discuss with hospitalist services for admission.   Clinical Course as of Oct 06 136  Sat Oct 06, 2020  0003 BP 157/92 after application of ntg paste.  IV Morphine ordered for headache.   [JS]    Clinical Course User Index [JS] Irean HongSung, Nicasio Barlowe J, MD     ____________________________________________   FINAL CLINICAL IMPRESSION(S) / ED DIAGNOSES  Final diagnoses:  COPD exacerbation (HCC)  Hypoxia  Shortness of breath     ED Discharge Orders    None      *Please note:  Michaelle BirksWalter J Learn was evaluated in Emergency Department on 10/06/2020 for the symptoms described in the history of present illness. He  was evaluated in the context of the global COVID-19 pandemic, which necessitated consideration that the patient might be at risk for infection with the SARS-CoV-2 virus that causes COVID-19. Institutional protocols and algorithms that pertain to the evaluation of patients at risk for COVID-19 are in a state of rapid change based on information released by regulatory bodies including the CDC and federal and state organizations. These policies and algorithms were followed during the patient's care in the ED.  Some ED evaluations and interventions may be delayed as a result of limited staffing during and the pandemic.*   Note:  This document was prepared using Dragon voice recognition software and may include unintentional dictation errors.   Irean Hong, MD 10/06/20 [phone]

## 2020-10-06 DIAGNOSIS — J9621 Acute and chronic respiratory failure with hypoxia: Secondary | ICD-10-CM

## 2020-10-06 DIAGNOSIS — I160 Hypertensive urgency: Secondary | ICD-10-CM | POA: Diagnosis present

## 2020-10-06 DIAGNOSIS — J9600 Acute respiratory failure, unspecified whether with hypoxia or hypercapnia: Secondary | ICD-10-CM | POA: Diagnosis present

## 2020-10-06 DIAGNOSIS — Z720 Tobacco use: Secondary | ICD-10-CM

## 2020-10-06 DIAGNOSIS — J440 Chronic obstructive pulmonary disease with acute lower respiratory infection: Secondary | ICD-10-CM | POA: Diagnosis not present

## 2020-10-06 DIAGNOSIS — Z9981 Dependence on supplemental oxygen: Secondary | ICD-10-CM | POA: Diagnosis not present

## 2020-10-06 DIAGNOSIS — Z7951 Long term (current) use of inhaled steroids: Secondary | ICD-10-CM | POA: Diagnosis not present

## 2020-10-06 DIAGNOSIS — J209 Acute bronchitis, unspecified: Secondary | ICD-10-CM | POA: Diagnosis present

## 2020-10-06 DIAGNOSIS — Z20822 Contact with and (suspected) exposure to covid-19: Secondary | ICD-10-CM | POA: Diagnosis present

## 2020-10-06 DIAGNOSIS — F1010 Alcohol abuse, uncomplicated: Secondary | ICD-10-CM | POA: Diagnosis present

## 2020-10-06 DIAGNOSIS — Z825 Family history of asthma and other chronic lower respiratory diseases: Secondary | ICD-10-CM | POA: Diagnosis not present

## 2020-10-06 DIAGNOSIS — H548 Legal blindness, as defined in USA: Secondary | ICD-10-CM | POA: Diagnosis present

## 2020-10-06 DIAGNOSIS — F17210 Nicotine dependence, cigarettes, uncomplicated: Secondary | ICD-10-CM | POA: Diagnosis present

## 2020-10-06 DIAGNOSIS — J441 Chronic obstructive pulmonary disease with (acute) exacerbation: Secondary | ICD-10-CM | POA: Diagnosis present

## 2020-10-06 DIAGNOSIS — K219 Gastro-esophageal reflux disease without esophagitis: Secondary | ICD-10-CM | POA: Diagnosis present

## 2020-10-06 DIAGNOSIS — Z79899 Other long term (current) drug therapy: Secondary | ICD-10-CM | POA: Diagnosis not present

## 2020-10-06 HISTORY — DX: Acute and chronic respiratory failure with hypoxia: J96.21

## 2020-10-06 LAB — CBC
HCT: 42.1 % (ref 39.0–52.0)
Hemoglobin: 14.2 g/dL (ref 13.0–17.0)
MCH: 30.7 pg (ref 26.0–34.0)
MCHC: 33.7 g/dL (ref 30.0–36.0)
MCV: 90.9 fL (ref 80.0–100.0)
Platelets: 227 10*3/uL (ref 150–400)
RBC: 4.63 MIL/uL (ref 4.22–5.81)
RDW: 13.4 % (ref 11.5–15.5)
WBC: 6.9 10*3/uL (ref 4.0–10.5)
nRBC: 0 % (ref 0.0–0.2)

## 2020-10-06 LAB — BASIC METABOLIC PANEL
Anion gap: 11 (ref 5–15)
BUN: 18 mg/dL (ref 8–23)
CO2: 29 mmol/L (ref 22–32)
Calcium: 8.7 mg/dL — ABNORMAL LOW (ref 8.9–10.3)
Chloride: 95 mmol/L — ABNORMAL LOW (ref 98–111)
Creatinine, Ser: 0.74 mg/dL (ref 0.61–1.24)
GFR, Estimated: >60 mL/min (ref >60)
Glucose, Bld: 246 mg/dL — ABNORMAL HIGH (ref 70–99)
Potassium: 4 mmol/L (ref 3.5–5.1)
Sodium: 135 mmol/L (ref 135–145)

## 2020-10-06 LAB — RESP PANEL BY RT-PCR (FLU A&B, COVID) ARPGX2
Influenza A by PCR: NEGATIVE
Influenza B by PCR: NEGATIVE
SARS Coronavirus 2 by RT PCR: NEGATIVE

## 2020-10-06 LAB — PROCALCITONIN: Procalcitonin: <0.1 ng/mL

## 2020-10-06 LAB — TROPONIN I (HIGH SENSITIVITY): Troponin I (High Sensitivity): 11 ng/L (ref <18)

## 2020-10-06 MED ORDER — ACETAMINOPHEN 650 MG RE SUPP: 650.0000 mg | Freq: Four times a day (QID) | RECTAL | Status: DC | PRN

## 2020-10-06 MED ORDER — HYDROCOD POLST-CPM POLST ER 10-8 MG/5ML PO SUER
5.0000 mL | Freq: Two times a day (BID) | ORAL | Status: DC | PRN
  Administered 2020-10-06: 5 mL via ORAL
  Filled 2020-10-06: qty 5

## 2020-10-06 MED ORDER — PREDNISONE 20 MG PO TABS
40.0000 mg | ORAL_TABLET | Freq: Every day | ORAL | Status: DC
  Administered 2020-10-07: 40 mg via ORAL
  Filled 2020-10-06: qty 2

## 2020-10-06 MED ORDER — MORPHINE SULFATE (PF) 2 MG/ML IV SOLN: 2.0000 mg | Freq: Once | INTRAVENOUS | Status: DC

## 2020-10-06 MED ORDER — SODIUM CHLORIDE 0.9 % IV SOLN: INTRAVENOUS | Status: DC

## 2020-10-06 MED ORDER — GUAIFENESIN ER 600 MG PO TB12
600.0000 mg | ORAL_TABLET | Freq: Two times a day (BID) | ORAL | Status: DC
  Administered 2020-10-06 – 2020-10-07 (×4): 600 mg via ORAL
  Filled 2020-10-06 (×5): qty 1

## 2020-10-06 MED ORDER — KETOROLAC TROMETHAMINE 30 MG/ML IJ SOLN
30.0000 mg | Freq: Three times a day (TID) | INTRAMUSCULAR | Status: DC | PRN
  Administered 2020-10-06: 30 mg via INTRAVENOUS
  Filled 2020-10-06 (×2): qty 1

## 2020-10-06 MED ORDER — SODIUM CHLORIDE 0.9 % IV SOLN: 1.0000 g | INTRAVENOUS | Status: DC

## 2020-10-06 MED ORDER — SODIUM CHLORIDE 0.9 % IV SOLN
1.0000 g | INTRAVENOUS | Status: DC
  Administered 2020-10-06: 1 g via INTRAVENOUS
  Filled 2020-10-06: qty 10

## 2020-10-06 MED ORDER — ONDANSETRON HCL 4 MG/2ML IJ SOLN: 4.0000 mg | Freq: Four times a day (QID) | INTRAMUSCULAR | Status: DC | PRN

## 2020-10-06 MED ORDER — ONDANSETRON HCL 4 MG PO TABS: 4.0000 mg | ORAL_TABLET | Freq: Four times a day (QID) | ORAL | Status: DC | PRN

## 2020-10-06 MED ORDER — ACETAMINOPHEN 325 MG PO TABS
650.0000 mg | ORAL_TABLET | Freq: Four times a day (QID) | ORAL | Status: DC | PRN
  Filled 2020-10-06: qty 2

## 2020-10-06 MED ORDER — MAGNESIUM HYDROXIDE 400 MG/5ML PO SUSP: 30.0000 mL | Freq: Every day | ORAL | Status: DC | PRN

## 2020-10-06 MED ORDER — TIOTROPIUM BROMIDE MONOHYDRATE 18 MCG IN CAPS
1.0000 | ORAL_CAPSULE | Freq: Every day | RESPIRATORY_TRACT | Status: DC
  Administered 2020-10-06 – 2020-10-07 (×2): 18 ug via RESPIRATORY_TRACT
  Filled 2020-10-06: qty 5

## 2020-10-06 MED ORDER — METHYLPREDNISOLONE SODIUM SUCC 40 MG IJ SOLR
40.0000 mg | Freq: Four times a day (QID) | INTRAMUSCULAR | Status: AC
  Administered 2020-10-06 (×4): 40 mg via INTRAVENOUS
  Filled 2020-10-06 (×4): qty 1

## 2020-10-06 MED ORDER — IPRATROPIUM-ALBUTEROL 0.5-2.5 (3) MG/3ML IN SOLN
3.0000 mL | Freq: Four times a day (QID) | RESPIRATORY_TRACT | Status: DC
  Administered 2020-10-06 – 2020-10-07 (×6): 3 mL via RESPIRATORY_TRACT
  Filled 2020-10-06 (×7): qty 3

## 2020-10-06 MED ORDER — TRAZODONE HCL 50 MG PO TABS: 25.0000 mg | ORAL_TABLET | Freq: Every evening | ORAL | Status: DC | PRN

## 2020-10-06 MED ORDER — ENOXAPARIN SODIUM 40 MG/0.4ML ~~LOC~~ SOLN
40.0000 mg | SUBCUTANEOUS | Status: DC
  Administered 2020-10-06 – 2020-10-07 (×2): 40 mg via SUBCUTANEOUS
  Filled 2020-10-06 (×2): qty 0.4

## 2020-10-06 NOTE — ED Notes (Signed)
Pharmacy contacted requesting verification of ordered medications for RN to administer.  

## 2020-10-06 NOTE — Progress Notes (Signed)
No charge progress note.  Armani Brar  is a 64 y.o. African-American male with a known history of COPD(2L at home), asthma, depression and GERD, who presented to the emergency room with acute onset of worsening dyspnea with associated cough productive of brownish sputum and wheezing.  Also complaining of left shoulder pain secondary to recent accident.  Patient was hypoxic initially requiring BiPAP later transitioned to nasal cannula. COVID-19 PCR negative.  Chest x-ray without any acute changes.  Next  Patient received Solu-Medrol, magnesium sulfate, DuoNeb and morphine.  He was started on ceftriaxone and Zithromax along with Solu-Medrol and bronchodilators.  Patient was saturating 100% on 3 L when seen today.  Appears much comfortable although there was some scattered bilateral wheeze. He was complaining of left shoulder pain and asking for some stronger pain meds, stating Tylenol does not help.  Check procalcitonin and it came back less than 0.1%.  -Give him Toradol 30 mg as needed for shoulder pain. -Discontinue ceftriaxone. -Continue with Solu-Medrol, Zithromax and bronchodilator.  Can be discharged home tomorrow if remains stable.

## 2020-10-06 NOTE — ED Notes (Signed)
Lab called to add on blood cultures to the two sets sent upon patient presentation to ED. Per lab staff, put into process at this time.

## 2020-10-06 NOTE — H&P (Addendum)
[place]   PATIENT NAME: Bryan Kelley    MR#:  [id number]  DATE OF BIRTH:  Aug 17, 1956  DATE OF ADMISSION:  10/05/2020  PRIMARY CARE PHYSICIAN: Marya Fossa, PA-C   REQUESTING/REFERRING PHYSICIAN: Chiquita Loth, MD  CHIEF COMPLAINT:   Chief Complaint  Patient presents with  . Shortness of Breath    HISTORY OF PRESENT ILLNESS:  Bryan Kelley  is a 64 y.o. African-American male with a known history of COPD, asthma, depression and GERD, who presented to the emergency room with acute onset of worsening dyspnea with associated cough productive of brownish sputum and wheezing.  He admitted to sore throat with cough without ear ache or rhinorrhea.  No fever or chills.  No chest pain or palpitations.  No nausea or vomiting or abdominal pain.  He has been having difficulty with urinary stream.  Pulse oximetry was in the 80s by EMS.  He has been having left shoulder pain that he attributes to recent accident.  Upon presentation to the ER, BP was 159/111 with respiratory to 26 and pulse currently was 99% on 40% FiO2 on BiPAP.  CBC and BMP was unremarkable.  Influenza antigens and COVID-19 PCR came back negative.  Portable chest x-ray showed no acute cardiopulmonary disease.  The patient was given 125 mg IV Solu-Medrol and IV magnesium sulfate as well as 2 DuoNeb by EMS en route and 2 DuoNeb's here, 1 inch of Nitropaste and 2 mg of IV morphine sulfate.  He will be admitted to a progressive unit bed for further evaluation and management. PAST MEDICAL HISTORY:   Past Medical History:  Diagnosis Date  . Asthma   . COPD (chronic obstructive pulmonary disease) (HCC)   . Depression   . GERD (gastroesophageal reflux disease)   . Vision loss, left eye     PAST SURGICAL HISTORY:   Past Surgical History:  Procedure Laterality Date  . EYE SURGERY  2009, about 5465,6812   steel in left eye on the job, legally blind     SOCIAL HISTORY:   Social History   Tobacco Use  . Smoking status:  Current Some Day Smoker    Packs/day: 0.50    Years: 40.00    Pack years: 20.00    Types: Cigarettes    Last attempt to quit: 2020    Years since quitting: 1.9  . Smokeless tobacco: Never Used  Substance Use Topics  . Alcohol use: Yes    FAMILY HISTORY:   Family History  Problem Relation Age of Onset  . Diabetes Mother   . Hypertension Mother   . Stroke Mother   . Asthma Father   . Diabetes Father   . Diabetes Sister   . Hypertension Sister   . Diabetes Brother   . Hypertension Brother     DRUG ALLERGIES:  No Known Allergies  REVIEW OF SYSTEMS:   ROS As per history of present illness. All pertinent systems were reviewed above. Constitutional, HEENT, cardiovascular, respiratory, GI, GU, musculoskeletal, neuro, psychiatric, endocrine, integumentary and hematologic systems were reviewed and are otherwise negative/unremarkable except for positive findings mentioned above in the HPI.   MEDICATIONS AT HOME:   Prior to Admission medications   Medication Sig Start Date End Date Taking? Authorizing Provider  albuterol (PROVENTIL) (2.5 MG/3ML) 0.083% nebulizer solution Take 2.5 mg by nebulization every 4 (four) hours as needed for wheezing. 06/06/20   [provider]  albuterol (VENTOLIN HFA) 108 (90 Base) MCG/ACT inhaler Inhale 2 puffs into  the lungs every 6 (six) hours as needed for wheezing or shortness of breath. Patient not taking: Reported on 09/09/2020 11/30/19   Nita Sickle, MD  DULERA 200-5 MCG/ACT AERO Inhale 2 puffs into the lungs 2 (two) times daily. 09/12/20   Almon Hercules, MD  guaiFENesin-codeine 100-10 MG/5ML syrup Take 10 mLs by mouth 3 (three) times daily as needed (nighttime cough). Patient not taking: Reported on 09/09/2020 07/28/20   Esaw Grandchild A, DO  nicotine (NICODERM CQ - DOSED IN MG/24 HOURS) 21 mg/24hr patch Place 1 patch (21 mg total) onto the skin daily. Patient not taking: Reported on 09/09/2020 07/29/20   Esaw Grandchild A, DO   Tiotropium Bromide Monohydrate (SPIRIVA RESPIMAT) 2.5 MCG/ACT AERS Inhale 2 puffs into the lungs daily. 07/30/20   Glenford Bayley, NP  VIAGRA 50 MG tablet Take 50 mg by mouth as needed for erectile dysfunction. Patient not taking: Reported on 09/09/2020 03/03/20   [provider]      VITAL SIGNS:  Blood pressure (!) 157/92, pulse 85, temperature 97.6 F (36.4 C), temperature source Oral, resp. rate 18, height 5\' 6"  (1.676 m), weight 81.6 kg, SpO2 98 %.  PHYSICAL EXAMINATION:  Physical Exam  GENERAL:  64 y.o.-year-old patient lying in the bed with mild respiratory distress on BiPAP with conversational dyspnea.   EYES: Pupils equal, round, reactive to light and accommodation. No scleral icterus. Extraocular muscles intact.  HEENT: Head atraumatic, normocephalic. Oropharynx and nasopharynx clear.  NECK:  Supple, no jugular venous distention. No thyroid enlargement, no tenderness.  LUNGS: Diffuse expiratory wheezes with tight expiratory airflow and harsh vesicular breathing. CARDIOVASCULAR: Regular rate and rhythm, S1, S2 normal. No murmurs, rubs, or gallops.  ABDOMEN: Soft, nondistended, nontender. Bowel sounds present. No organomegaly or mass.  EXTREMITIES: No pedal edema, cyanosis, or clubbing.  NEUROLOGIC: Cranial nerves II through XII are intact. Muscle strength 5/5 in all extremities. Sensation intact. Gait not checked.  PSYCHIATRIC: The patient is alert and oriented x 3.  Normal affect and good eye contact. SKIN: No obvious rash, lesion, or ulcer.   LABORATORY PANEL:   CBC Recent Labs  Lab 10/05/20 2250  WBC 7.4  HGB 14.6  HCT 43.8  PLT 227   ------------------------------------------------------------------------------------------------------------------  Chemistries  Recent Labs  Lab 10/05/20 2250  NA 139  K 3.6  CL 100  CO2 28  GLUCOSE 136*  BUN 15  CREATININE 0.80  CALCIUM 9.1    ------------------------------------------------------------------------------------------------------------------  Cardiac Enzymes No results for input(s): TROPONINI in the last 168 hours. ------------------------------------------------------------------------------------------------------------------  RADIOLOGY:  DG Chest Portable 1 View  Result Date: 10/05/2020 CLINICAL DATA:  Shortness of breath EXAM: PORTABLE CHEST 1 VIEW COMPARISON:  None. FINDINGS: The heart size and mediastinal contours are within normal limits. Both lungs are clear. The visualized skeletal structures are unremarkable. IMPRESSION: No active disease. Electronically Signed   By: 14/01/2020 M.D.   On: 10/05/2020 23:25      IMPRESSION AND PLAN:   1.  Acute on chronic respiratory failure with hypoxia, secondary to COPD/asthma acute exacerbation likely due to acute bronchitis. -The patient will be admitted to the progressive unit bed. -We will continue him on BiPAP overnight and follow ABG. -We will resume IV steroid therapy with Solu-Medrol. -We will continue bronchodilator therapy with DuoNebs 4 times daily and every 4 hours as needed. -Mucolytic therapy will be provided. -We will add IV antibiotic therapy with Rocephin and Zithromax. -Sputum and blood cultures will be obtained. -Spiriva will be resumed. -We will  hold off his Dulera. -We will continue his Singulair.  2.  Hypertensive urgency.  His blood pressure has been as high as 186/114. -He will be placed on as needed IV labetalol. -We will start him on p.o. Norvasc.  3.  Tobacco abuse. -Counseled him for smoking cessation and he will receive further counseling here.  4.  GERD. -PPI therapy will be provided given steroid therapy.  5.  DVT prophylaxis. -Subcutaneous Lovenox.    All the records are reviewed and case discussed with ED provider. The plan of care was discussed in details with the patient (and family). I answered all questions.  The patient agreed to proceed with the above mentioned plan. Further management will depend upon hospital course.   CODE STATUS: Full code  Status is: Inpatient  Remains inpatient appropriate because:Ongoing diagnostic testing needed not appropriate for outpatient work up, Unsafe d/c plan, IV treatments appropriate due to intensity of illness or inability to take PO and Inpatient level of care appropriate due to severity of illness   Dispo: The patient is from: Home              Anticipated d/c is to: Home              Anticipated d/c date is: 3 days              Patient currently is not medically stable to d/c.   TOTAL TIME TAKING CARE OF THIS PATIENT: 55 minutes.    Hannah Beat M.D on 10/06/2020 at 12:19 AM  [facility]   From 7 PM-7 AM, contact night-coverage [email or web address]  CC: Primary care physician; Marya Fossa, PA-C

## 2020-10-07 DIAGNOSIS — J441 Chronic obstructive pulmonary disease with (acute) exacerbation: Principal | ICD-10-CM

## 2020-10-07 MED ORDER — GUAIFENESIN ER 600 MG PO TB12
600.0000 mg | ORAL_TABLET | Freq: Two times a day (BID) | ORAL | 0 refills | Status: DC
Start: 2020-10-07 — End: 2021-02-22

## 2020-10-07 MED ORDER — AZITHROMYCIN 250 MG PO TABS: ORAL_TABLET | ORAL | 0 refills | Status: AC

## 2020-10-07 MED ORDER — PREDNISONE 20 MG PO TABS: 40.0000 mg | ORAL_TABLET | Freq: Every day | ORAL | 0 refills | Status: AC
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
## 2020-10-07 NOTE — Progress Notes (Signed)
Patient is out of oxygen at home. Patient's sister has called the DME company with plans to have a tank delivered to the hospital. Patient also has concerns about having a ride a home, and states he will just go home and "chill" Patient has been educated on the importance of compliance with oxygen therapy. Case Management is involved and has provided a cab voucher, Patient agrees to wait for oxygen.

## 2020-10-07 NOTE — Plan of Care (Signed)
  Problem: Education: Goal: Knowledge of General Education information will improve Description: Including pain rating scale, medication(s)/side effects and non-pharmacologic comfort measures Outcome: Adequate for Discharge   

## 2020-10-07 NOTE — Plan of Care (Signed)

## 2020-10-07 NOTE — TOC Transition Note (Signed)
Transition of Care [facility]) - CM/SW Discharge Note   Patient Details  Name: Bryan Kelley MRN: [id number] Date of Birth: 03/09/1956  [facility] CM/SW Contact:  Bing Quarry, RN Phone Number: 10/07/2020, 2:12 PM   Clinical Narrative:   Patient to be discharged home/self care and to use home oxygen set up at home PTA. Unit RN contact CM concerned patient was trying to leave without transport oxygen and that home oxygen was empty. [employer] contacted and appears patient left Oxygen concentrator at another location. Jermain at [place] will have transport oxygen delivered to room and fix the home set up situation as he has been in contact with sister. Pt agreed to stay with a provided taxi voucher. Unit RN updated. Gabriel Cirri RN CM     Final next level of care: Home/Self Care     Patient Goals and CMS Choice        Discharge Placement                       Discharge Plan and Services                DME Arranged:  (Patient stated he has oxygen at home per provider notes and to resume this on discharge.)                    Social Determinants of Health (SDOH) Interventions     Readmission Risk Interventions No flowsheet data found.

## 2020-10-11 LAB — CULTURE, BLOOD (ROUTINE X 2)
Culture: NO GROWTH
Culture: NO GROWTH

## 2020-11-22 ENCOUNTER — Inpatient Hospital Stay
Admission: EM | Admit: 2020-11-22 | Discharge: 2020-11-24 | DRG: 190 | Disposition: A | Discharge status: Home / Self Care | Payer: Medicaid Other | Attending: Internal Medicine | Admitting: Internal Medicine

## 2020-11-22 ENCOUNTER — Emergency Department: Payer: Medicaid Other

## 2020-11-22 ENCOUNTER — Other Ambulatory Visit: Payer: Self-pay

## 2020-11-22 DIAGNOSIS — Z823 Family history of stroke: Secondary | ICD-10-CM | POA: Diagnosis not present

## 2020-11-22 DIAGNOSIS — J9622 Acute and chronic respiratory failure with hypercapnia: Secondary | ICD-10-CM

## 2020-11-22 DIAGNOSIS — Z833 Family history of diabetes mellitus: Secondary | ICD-10-CM

## 2020-11-22 DIAGNOSIS — Z825 Family history of asthma and other chronic lower respiratory diseases: Secondary | ICD-10-CM | POA: Diagnosis not present

## 2020-11-22 DIAGNOSIS — Z20822 Contact with and (suspected) exposure to covid-19: Secondary | ICD-10-CM | POA: Diagnosis present

## 2020-11-22 DIAGNOSIS — J9621 Acute and chronic respiratory failure with hypoxia: Secondary | ICD-10-CM | POA: Diagnosis present

## 2020-11-22 DIAGNOSIS — Z8249 Family history of ischemic heart disease and other diseases of the circulatory system: Secondary | ICD-10-CM | POA: Diagnosis not present

## 2020-11-22 DIAGNOSIS — Z79899 Other long term (current) drug therapy: Secondary | ICD-10-CM

## 2020-11-22 DIAGNOSIS — K219 Gastro-esophageal reflux disease without esophagitis: Secondary | ICD-10-CM | POA: Diagnosis present

## 2020-11-22 DIAGNOSIS — F17210 Nicotine dependence, cigarettes, uncomplicated: Secondary | ICD-10-CM | POA: Diagnosis present

## 2020-11-22 DIAGNOSIS — I10 Essential (primary) hypertension: Secondary | ICD-10-CM | POA: Diagnosis present

## 2020-11-22 DIAGNOSIS — J9601 Acute respiratory failure with hypoxia: Secondary | ICD-10-CM

## 2020-11-22 DIAGNOSIS — Z7951 Long term (current) use of inhaled steroids: Secondary | ICD-10-CM | POA: Diagnosis not present

## 2020-11-22 DIAGNOSIS — J441 Chronic obstructive pulmonary disease with (acute) exacerbation: Secondary | ICD-10-CM | POA: Diagnosis present

## 2020-11-22 LAB — CBC WITH DIFFERENTIAL/PLATELET
Abs Immature Granulocytes: 0.02 10*3/uL (ref 0.00–0.07)
Basophils Absolute: 0 10*3/uL (ref 0.0–0.1)
Basophils Relative: 0 %
Eosinophils Absolute: 0.1 10*3/uL (ref 0.0–0.5)
Eosinophils Relative: 2 %
HCT: 45.2 % (ref 39.0–52.0)
Hemoglobin: 15.4 g/dL (ref 13.0–17.0)
Immature Granulocytes: 0 %
Lymphocytes Relative: 39 %
Lymphs Abs: 3.1 10*3/uL (ref 0.7–4.0)
MCH: 30.1 pg (ref 26.0–34.0)
MCHC: 34.1 g/dL (ref 30.0–36.0)
MCV: 88.5 fL (ref 80.0–100.0)
Monocytes Absolute: 0.4 10*3/uL (ref 0.1–1.0)
Monocytes Relative: 6 %
Neutro Abs: 4.2 10*3/uL (ref 1.7–7.7)
Neutrophils Relative %: 53 %
Platelets: 232 10*3/uL (ref 150–400)
RBC: 5.11 MIL/uL (ref 4.22–5.81)
RDW: 12.7 % (ref 11.5–15.5)
WBC: 7.9 10*3/uL (ref 4.0–10.5)
nRBC: 0 % (ref 0.0–0.2)

## 2020-11-22 LAB — COMPREHENSIVE METABOLIC PANEL
ALT: 25 U/L (ref 0–44)
AST: 30 U/L (ref 15–41)
Albumin: 4.3 g/dL (ref 3.5–5.0)
Alkaline Phosphatase: 85 U/L (ref 38–126)
Anion gap: 11 (ref 5–15)
BUN: 18 mg/dL (ref 8–23)
CO2: 33 mmol/L — ABNORMAL HIGH (ref 22–32)
Calcium: 9.7 mg/dL (ref 8.9–10.3)
Chloride: 100 mmol/L (ref 98–111)
Creatinine, Ser: 1.14 mg/dL (ref 0.61–1.24)
GFR, Estimated: >60 mL/min (ref >60)
Glucose, Bld: 145 mg/dL — ABNORMAL HIGH (ref 70–99)
Potassium: 3.6 mmol/L (ref 3.5–5.1)
Sodium: 144 mmol/L (ref 135–145)
Total Bilirubin: 0.7 mg/dL (ref 0.3–1.2)
Total Protein: 7.7 g/dL (ref 6.5–8.1)

## 2020-11-22 LAB — BLOOD GAS, VENOUS
Acid-Base Excess: 8.9 mmol/L — ABNORMAL HIGH (ref 0.0–2.0)
Bicarbonate: 37.5 mmol/L — ABNORMAL HIGH (ref 20.0–28.0)
Delivery systems: POSITIVE
FIO2: 0.4
O2 Saturation: 61.9 %
Patient temperature: 37
RATE: 8 resp/min
pCO2, Ven: 68 mmHg — ABNORMAL HIGH (ref 44.0–60.0)
pH, Ven: 7.35 (ref 7.250–7.430)
pO2, Ven: 34 mmHg (ref 32.0–45.0)

## 2020-11-22 LAB — RESP PANEL BY RT-PCR (FLU A&B, COVID) ARPGX2
Influenza A by PCR: NEGATIVE
Influenza B by PCR: NEGATIVE
SARS Coronavirus 2 by RT PCR: NEGATIVE

## 2020-11-22 LAB — TROPONIN I (HIGH SENSITIVITY)
Troponin I (High Sensitivity): 13 ng/L (ref <18)
Troponin I (High Sensitivity): 12 ng/L (ref <18)

## 2020-11-22 LAB — HIV ANTIBODY (ROUTINE TESTING W REFLEX): HIV Screen 4th Generation wRfx: NONREACTIVE

## 2020-11-22 LAB — BRAIN NATRIURETIC PEPTIDE: B Natriuretic Peptide: 24.7 pg/mL (ref 0.0–100.0)

## 2020-11-22 MED ORDER — PANTOPRAZOLE SODIUM 20 MG PO TBEC
20.0000 mg | DELAYED_RELEASE_TABLET | Freq: Every day | ORAL | Status: DC
  Administered 2020-11-22 – 2020-11-23 (×2): 20 mg via ORAL
  Filled 2020-11-22 (×4): qty 1

## 2020-11-22 MED ORDER — SODIUM CHLORIDE 0.9 % IV SOLN
1.0000 g | Freq: Once | INTRAVENOUS | Status: AC
  Administered 2020-11-22: 1 g via INTRAVENOUS
  Filled 2020-11-22: qty 10

## 2020-11-22 MED ORDER — MAGNESIUM SULFATE 2 GM/50ML IV SOLN
2.0000 g | Freq: Once | INTRAVENOUS | Status: AC
  Administered 2020-11-22: 2 g via INTRAVENOUS
  Filled 2020-11-22: qty 50

## 2020-11-22 MED ORDER — IPRATROPIUM-ALBUTEROL 0.5-2.5 (3) MG/3ML IN SOLN
RESPIRATORY_TRACT | Status: AC
  Filled 2020-11-22: qty 9

## 2020-11-22 MED ORDER — IPRATROPIUM-ALBUTEROL 0.5-2.5 (3) MG/3ML IN SOLN
3.0000 mL | Freq: Four times a day (QID) | RESPIRATORY_TRACT | Status: DC
  Administered 2020-11-22 – 2020-11-24 (×7): 3 mL via RESPIRATORY_TRACT
  Filled 2020-11-22 (×5): qty 3

## 2020-11-22 MED ORDER — PREDNISONE 20 MG PO TABS
40.0000 mg | ORAL_TABLET | Freq: Every day | ORAL | Status: DC
  Administered 2020-11-23 – 2020-11-24 (×2): 40 mg via ORAL
  Filled 2020-11-22 (×2): qty 2

## 2020-11-22 MED ORDER — SODIUM CHLORIDE 0.9 % IV SOLN
1.0000 g | INTRAVENOUS | Status: DC
  Administered 2020-11-22 – 2020-11-24 (×3): 1 g via INTRAVENOUS
  Filled 2020-11-22 (×2): qty 10
  Filled 2020-11-22: qty 1

## 2020-11-22 MED ORDER — GUAIFENESIN 100 MG/5ML PO SOLN
5.0000 mL | Freq: Four times a day (QID) | ORAL | Status: DC | PRN
  Administered 2020-11-22 – 2020-11-23 (×3): 100 mg via ORAL
  Filled 2020-11-22 (×7): qty 5

## 2020-11-22 MED ORDER — SODIUM CHLORIDE 0.9 % IV SOLN
500.0000 mg | INTRAVENOUS | Status: DC
  Administered 2020-11-22 – 2020-11-24 (×4): 500 mg via INTRAVENOUS
  Filled 2020-11-22 (×4): qty 500

## 2020-11-22 MED ORDER — ENOXAPARIN SODIUM 40 MG/0.4ML ~~LOC~~ SOLN
40.0000 mg | SUBCUTANEOUS | Status: DC
  Administered 2020-11-23 – 2020-11-24 (×2): 40 mg via SUBCUTANEOUS
  Filled 2020-11-22 (×2): qty 0.4

## 2020-11-22 MED ORDER — ALBUTEROL SULFATE (2.5 MG/3ML) 0.083% IN NEBU
2.5000 mg | INHALATION_SOLUTION | RESPIRATORY_TRACT | Status: DC | PRN
Start: 2020-11-22 — End: 2020-11-24
  Administered 2020-11-24: 2.5 mg via RESPIRATORY_TRACT
  Filled 2020-11-22 (×2): qty 3

## 2020-11-22 MED ORDER — PANTOPRAZOLE SODIUM 40 MG IV SOLR
40.0000 mg | Freq: Once | INTRAVENOUS | Status: AC
  Administered 2020-11-22: 40 mg via INTRAVENOUS
  Filled 2020-11-22: qty 40

## 2020-11-22 MED ORDER — ACETAMINOPHEN 325 MG PO TABS: 650.0000 mg | ORAL_TABLET | Freq: Four times a day (QID) | ORAL | Status: DC | PRN

## 2020-11-22 MED ORDER — FENTANYL CITRATE (PF) 100 MCG/2ML IJ SOLN
INTRAMUSCULAR | Status: AC
  Administered 2020-11-22: 100 ug
  Filled 2020-11-22: qty 2

## 2020-11-22 MED ORDER — ONDANSETRON HCL 4 MG/2ML IJ SOLN
INTRAMUSCULAR | Status: AC
  Administered 2020-11-22: 4 mg
  Filled 2020-11-22: qty 2

## 2020-11-22 MED ORDER — METHYLPREDNISOLONE SODIUM SUCC 125 MG IJ SOLR
60.0000 mg | Freq: Four times a day (QID) | INTRAMUSCULAR | Status: AC
  Administered 2020-11-22 (×4): 60 mg via INTRAVENOUS
  Filled 2020-11-22 (×4): qty 2

## 2020-11-22 NOTE — Evaluation (Signed)
Physical Therapy Evaluation Patient Details Name: Bryan Kelley MRN: [id number] DOB: [birth year] Today's Date: 11/22/2020   History of Present Illness  Pt is a 65yo M admitted to [facility] on 11/22/20 for c/o worsening dyspnea with associated productive cough, wheezing, and mid sternal chest pain, not responding to home bronchodilator therapy. Pt hypoxic in the 80's and placed on BiPAP. Imaging unremarkable.    Clinical Impression  Pt is a 65 year old M admitted to hospital on 11/22/20 for acute respiratory distress. At baseline, pt is Ind with ADL's and short household ambulation but requires increased time/effort due to DOE; sister and her boyfriend assist with IADL's. Pt presents with generalized deconditioning, chest pain, increased O2 dependence from baseline, and decreased cardiopulmonary tolerance to activity, resulting in impaired functional mobility. Due to deficits, pt required supervision for safety/line management with transfers, marching, and ambulation at bedside with x1 seated rest break; otherwise pt able to be Ind with mobility. Pt with adequate foot clearance/balance during marching indicating adequate safety and functional mobility for stair navigation. While pt was able to maintain SpO2 >90% throughout session on 5LPM, he demonstrated verbal dyspnea, wheezing, increased RR, and coughing with all mobility; pt notes chest pain as limiting factor with further participation with therapy. Deficits limit the pt's ability to safely and independently perform ADL's, transfer, and ambulate. Pt will benefit from acute skilled PT services to address cardiopulmonary deficits for return to baseline. At this time, PT recommends pulmonary rehab at DC to improve cardiopulmonary tolerance to activity for improved functional mobility and overall quality of life.     Follow Up Recommendations Other (comment) (Pulmonary Rehab)    Equipment Recommendations  None recommended by PT    Recommendations for Other  Services       Precautions / Restrictions Precautions Precautions: Fall Restrictions Weight Bearing Restrictions: No      Mobility  Bed Mobility Overal bed mobility: Independent             General bed mobility comments: Ind with supine<>sit transfer    Transfers Overall transfer level: Needs assistance Equipment used: None Transfers: Sit to/from Stand Sit to Stand: Supervision         General transfer comment: Supervision for safety/line management to perform STS from ED stretcher bed, without AD.  Ambulation/Gait Ambulation/Gait assistance: Supervision Gait Distance (Feet): 8 Feet (91ft x2 (lateral stepping at EOB), 66ft x2 forward/backward walking) Assistive device: None       General Gait Details: Supervision for safety/line management to ambulate at bedside without AD. Pt demonstrated early reciprocal gait, but adequate foot clearance bil. Pt able to demo backwards/forwards/lateral gait without LOB. x1 seated rest break required during mobility due to DOE. Pt able to march for 30sec without UE support, demonstrating good balance/foot clearance.     Balance Overall balance assessment: Needs assistance Sitting-balance support: No upper extremity supported;Feet unsupported Sitting balance-Leahy Scale: Normal Sitting balance - Comments: No LOB while seated EOB   Standing balance support: No upper extremity supported;During functional activity Standing balance-Leahy Scale: Good Standing balance comment: Good standing balance without UE support                             Pertinent Vitals/Pain Pain Assessment: 0-10 Pain Score: 10-Worst pain ever Pain Location: chest pain Pain Descriptors / Indicators: Sharp;Constant Pain Intervention(s): Limited activity within patient's tolerance;Monitored during session    Home Living Family/patient expects to be discharged to:: Private residence Living Arrangements:  Other relatives (Sister and sisters  boyfriend) Available Help at Discharge: Family;Available 24 hours/day (Sister is disabled, but able to provide 24/7 supervision) Type of Home: Mobile home Home Access: Stairs to enter Entrance Stairs-Rails: Right Entrance Stairs-Number of Steps: 3 Home Layout: One level   Additional Comments: oxygen and pulse ox    Prior Function Level of Independence: Independent;Needs assistance   Gait / Transfers Assistance Needed: Ind with short household ambulation; pt able to walk < 30ft before requiring seated rest break due to DOE  ADL's / Homemaking Assistance Needed: Ind with ADL's,simple meals, and cleaning his room, but requires increased time/effort due to DOE. Requires assistance for other IADL's (groceries, cleaning, cooking, laundry, etc)        Hand Dominance   Dominant Hand: Right    Extremity/Trunk Assessment   Upper Extremity Assessment Upper Extremity Assessment: Overall WFL for tasks assessed;LUE deficits/detail LUE Deficits / Details: Hx of shoulder pathology; shoulder flex < 90deg, unable to perform MMT of shoulder/elbow due to pain    Lower Extremity Assessment Lower Extremity Assessment: Overall WFL for tasks assessed    Cervical / Trunk Assessment Cervical / Trunk Assessment: Normal  Communication   Communication: No difficulties  Cognition Arousal/Alertness: Awake/alert Behavior During Therapy: WFL for tasks assessed/performed Overall Cognitive Status: Within Functional Limits for tasks assessed                                 General Comments: Pt A&O x4 and able to follow 100% of 2-step commands      General Comments General comments (skin integrity, edema, etc.): Increased labored breathing, wheezing, and coughing noted with mobility; however, pt able to maintain SpO2 >90% throughout session on 5L    Exercises Other Exercises Other Exercises: Pt able to participate in bed mobility, transfers, marching, and ambulation without AD, grossly  requiring supervision for safety/line management. x1 seated rest break due to DOE. Pt able to be Ind due to good balance and safety awareness. Increased labored breathing, wheezing, and coughing noted with mobility with SpO2 >90% throughout session on 5L. Other Exercises: Pt educated regarding: pulse ox significance/reading, pursed lip breathing, PT role/POC, DC recommendations, activity modification, pacing, safety with mobility; he verbalized understanding.   Assessment/Plan    PT Assessment Patient needs continued PT services  PT Problem List Cardiopulmonary status limiting activity;Decreased activity tolerance;Pain       PT Treatment Interventions Gait training;Stair training;Functional mobility training;Therapeutic activities;Therapeutic exercise    PT Goals (Current goals can be found in the Care Plan section)  Acute Rehab PT Goals Patient Stated Goal: "to go home" PT Goal Formulation: With patient Time For Goal Achievement: 12/06/20 Potential to Achieve Goals: Good    Frequency Min 2X/week   Barriers to discharge Decreased caregiver support         AM-PAC PT "6 Clicks" Mobility  Outcome Measure Help needed turning from your back to your side while in a flat bed without using bedrails?: None Help needed moving from lying on your back to sitting on the side of a flat bed without using bedrails?: None Help needed moving to and from a bed to a chair (including a wheelchair)?: A Little Help needed standing up from a chair using your arms (e.g., wheelchair or bedside chair)?: A Little Help needed to walk in hospital room?: A Little Help needed climbing 3-5 steps with a railing? : A Little 6 Click Score: 20  End of Session Equipment Utilized During Treatment: Gait belt;Oxygen (5LPM) Activity Tolerance: Patient tolerated treatment well;Patient limited by fatigue Patient left: in bed;with call bell/phone within reach (no bed alarm on ED bed) Nurse Communication: Mobility  status PT Visit Diagnosis: Other (comment);Pain (cardiopulmonary tolerance to activity) Pain - Right/Left:  (chest)    Time: 3762-8315 PT Time Calculation (min) (ACUTE ONLY): 18 min   Charges:   PT Evaluation $PT Eval Moderate Complexity: 1 Mod        Vira Blanco, PT, DPT 2:54 PM,11/22/20

## 2020-11-22 NOTE — Progress Notes (Addendum)
Patient ID: Bryan Kelley, male   DOB: [birth year], 65 y.o.   MRN: [id number]  This is a no charge progress note as patient was admitted this a.m. H&P reviewed patient seen this a.m. [name] is a 65 y.o. male with medical history significant for COPD(2L at home), asthma, depression and GERD, who presented to the emergency room with acute onset of worsening dyspnea with associated productive coughand wheezing not responding to home bronchodilator therapy This am , Patient complaining of cough and constant chest pain specially with his cough.  His shortness of breath is improving and now he is on nasal cannula.good mentation.  A/p: Copd exac- continue with inhalers, duonebs, iv steroid. Add azithromycin for possible bronchitis . Cp more muscular in nature.

## 2020-11-22 NOTE — H&P (Addendum)
History and Physical    BOWDY BAIR UEA:[id number] DOB: [birth year] DOA: 11/22/2020  [facility]P: [name]   Patient coming from: Home I have personally briefly reviewed patient's old medical records in [facility]  Chief Complaint: Respiratory distress  HPI: Bryan Kelley is a 65 y.o. male with medical history significant for COPD(2L at home), asthma, depression and GERD, who presented to the emergency room with acute onset of worsening dyspnea with associated productive coughand wheezing not responding to home bronchodilator therapy.  He denied fever or chills, leg pain or swelling.  Did complain of upper extragastric pain, similar to his acid reflux pain, worse on coughing.    Denied COVID exposure.   O2 sat with EMS was in the mid 80s.   He received 2 DuoNeb's in route ED Course: On arrival he was placed on BiPAP due to respiratory distress.  He was afebrile BP 139/84 pulse 101 respirations 22, O2 sat 100% on BiPAP.  Venous pH significant for PCO2 of 68.  Blood work otherwise unremarkable.  Troponin 13, BNP 24 EKG as reviewed by me : Sinus tachycardia at 102 without ST-T wave changes Imaging: No acute disease  Patient treated with duo nebs and Solu-Medrol as well as IV magnesium with improvement.  Hospitalist consulted for admission.  Review of Systems: As per HPI otherwise all other systems on review of systems negative.    Past Medical History:  Diagnosis Date  . Asthma   . COPD (chronic obstructive pulmonary disease) (HCC)   . Depression   . GERD (gastroesophageal reflux disease)   . Vision loss, left eye     Past Surgical History:  Procedure Laterality Date  . EYE SURGERY  2009, about 4782,9562   steel in left eye on the job, legally blind      reports that he has been smoking cigarettes. He has a 20.00 pack-year smoking history. He has never used smokeless tobacco. He reports current alcohol use. He reports that he does not use drugs.  No  Known Allergies  Family History  Problem Relation Age of Onset  . Diabetes Mother   . Hypertension Mother   . Stroke Mother   . Asthma Father   . Diabetes Father   . Diabetes Sister   . Hypertension Sister   . Diabetes Brother   . Hypertension Brother       Prior to Admission medications   Medication Sig Start Date End Date Taking? Authorizing Provider  albuterol (PROVENTIL) (2.5 MG/3ML) 0.083% nebulizer solution Take 2.5 mg by nebulization every 4 (four) hours as needed for wheezing. 06/06/20   [provider]  albuterol (VENTOLIN HFA) 108 (90 Base) MCG/ACT inhaler Inhale 2 puffs into the lungs every 6 (six) hours as needed for wheezing or shortness of breath. Patient not taking: Reported on 09/09/2020 11/30/19   Nita Sickle, MD  DULERA 200-5 MCG/ACT AERO Inhale 2 puffs into the lungs 2 (two) times daily. 09/12/20   Almon Hercules, MD  guaiFENesin (MUCINEX) 600 MG 12 hr tablet Take 1 tablet (600 mg total) by mouth 2 (two) times daily. 10/07/20   Arnetha Courser, MD  montelukast (SINGULAIR) 10 MG tablet Take 10 mg by mouth daily. 09/25/20   [provider]  nicotine (NICODERM CQ - DOSED IN MG/24 HOURS) 21 mg/24hr patch Place 1 patch (21 mg total) onto the skin daily. Patient not taking: Reported on 09/09/2020 07/29/20   Esaw Grandchild A, DO  Tiotropium Bromide Monohydrate (SPIRIVA RESPIMAT)  2.5 MCG/ACT AERS Inhale 2 puffs into the lungs daily. 07/30/20   Glenford Bayley, NP  VIAGRA 50 MG tablet Take 50 mg by mouth as needed for erectile dysfunction. Patient not taking: Reported on 09/09/2020 03/03/20   [provider]    Physical Exam: Vitals:   11/22/20 0358 11/22/20 0400 11/22/20 0430 11/22/20 0500  BP:  (!) 139/91 (!) 117/101 138/81  Pulse:  88 96 92  Resp:  14 (!) 23 15  SpO2:  98% 100% 96%  Weight: 83 kg     Height: 5\' 6"  (1.676 m)        Vitals:   11/22/20 0358 11/22/20 0400 11/22/20 0430 11/22/20 0500  BP:  (!) 139/91 (!) 117/101 138/81   Pulse:  88 96 92  Resp:  14 (!) 23 15  SpO2:  98% 100% 96%  Weight: 83 kg     Height: 5\' 6"  (1.676 m)         Constitutional: Alert and oriented x 3 .  Increased work of breathing HEENT:      Head: Normocephalic and atraumatic.         Eyes: PERLA, EOMI, Conjunctivae are normal. Sclera is non-icteric.       Mouth/Throat: Mucous membranes are moist.       Neck: Supple with no signs of meningismus. Cardiovascular:  Tachycardic. No murmurs, gallops, or rubs. 2+ symmetrical distal pulses are present . No JVD. No LE edema Respiratory: Respiratory effort increased.Lungs sounds diminished bilaterally.  Scattered rhonchi, increased work of breathing, speaking in short sentence.  Chest wall: Pain on palpation over lower sternum Gastrointestinal: Soft, non tender, and non distended with positive bowel sounds.  Genitourinary: No CVA tenderness. Musculoskeletal: Nontender with normal range of motion in all extremities. No cyanosis, or erythema of extremities. Neurologic:  Face is symmetric. Moving all extremities. No gross focal neurologic deficits . Skin: Skin is warm, dry.  No rash or ulcers Psychiatric: Mood and affect are normal    Labs on Admission: I have personally reviewed following labs and imaging studies  CBC: Recent Labs  Lab 11/22/20 0409  WBC 7.9  NEUTROABS 4.2  HGB 15.4  HCT 45.2  MCV 88.5  PLT 232   Basic Metabolic Panel: Recent Labs  Lab 11/22/20 0409  NA 144  K 3.6  CL 100  CO2 33*  GLUCOSE 145*  BUN 18  CREATININE 1.14  CALCIUM 9.7   GFR: Estimated Creatinine Clearance: 67.1 mL/min (by C-G formula based on SCr of 1.14 mg/dL). Liver Function Tests: Recent Labs  Lab 11/22/20 0409  AST 30  ALT 25  ALKPHOS 85  BILITOT 0.7  PROT 7.7  ALBUMIN 4.3   No results for input(s): LIPASE, AMYLASE in the last 168 hours. No results for input(s): AMMONIA in the last 168 hours. Coagulation Profile: No results for input(s): INR, PROTIME in the last 168  hours. Cardiac Enzymes: No results for input(s): CKTOTAL, CKMB, CKMBINDEX, TROPONINI in the last 168 hours. BNP (last 3 results) No results for input(s): PROBNP in the last 8760 hours. HbA1C: No results for input(s): HGBA1C in the last 72 hours. CBG: No results for input(s): GLUCAP in the last 168 hours. Lipid Profile: No results for input(s): CHOL, HDL, LDLCALC, TRIG, CHOLHDL, LDLDIRECT in the last 72 hours. Thyroid Function Tests: No results for input(s): TSH, T4TOTAL, FREET4, T3FREE, THYROIDAB in the last 72 hours. Anemia Panel: No results for input(s): VITAMINB12, FOLATE, FERRITIN, TIBC, IRON, RETICCTPCT in the last 72 hours. Urine analysis:  No results found for: COLORURINE, APPEARANCEUR, LABSPEC, PHURINE, GLUCOSEU, HGBUR, BILIRUBINUR, KETONESUR, PROTEINUR, UROBILINOGEN, NITRITE, LEUKOCYTESUR  Radiological Exams on Admission: DG Chest Portable 1 View  Result Date: 11/22/2020 CLINICAL DATA:  Shortness of breath EXAM: PORTABLE CHEST 1 VIEW COMPARISON:  October 05, 2020. FINDINGS: The heart size and mediastinal contours are within normal limits. Both lungs are clear. The visualized skeletal structures are unremarkable. IMPRESSION: No active disease. Electronically Signed   By: Jonna Clark M.D.   On: 11/22/2020 03:55     Assessment/Plan 65 year old male with history of COPD(2L at home), asthma, nicotine dependence and GERD, presenting in acute respiratory distress requiring BiPAP   COPD with acute exacerbation (HCC)   Acute on chronic respiratory failure with hypercapnia and hypoxia (HCC) - Patient on home O2 at 2 L with COPD exacerbation with O2 sats in mid 80s with EMS requiring BiPAP on arrival - COVID PCR negative, chest x-ray with no acute disease - Venous ABG with PCO2 of 68 - Scheduled and as needed bronchodilator therapy - IV steroids - Continue BiPAP and wean to O2 via nasal cannula as tolerated  Atypical chest pain - Appears to be epigastric pain related to GERD and  chest wall pain related to coughing - Troponin 13, BNP 24 - Trial of IV Protonix - Continue to monitor  GERD - Continue home PPI  Tobacco abuse - Tobacco cessation counseling    DVT prophylaxis: Lovenox  Code Status: full code  Family Communication:  none  Disposition Plan: Back to previous home environment Consults called: none  Status: Observation    Andris Baumann MD [facility]     11/22/2020, 5:29 AM

## 2020-11-22 NOTE — ED Triage Notes (Signed)
Brought in by [facility] for shortness of breath "for a while", + nausea, + mid sternal chest pain/pressure.

## 2020-11-22 NOTE — ED Notes (Signed)
Pt resting at this time and appears asleep, normal rise and fall of chest, pt currently on biPaP. NAD noted.

## 2020-11-22 NOTE — Evaluation (Signed)
Occupational Therapy Evaluation Patient Details Name: Bryan Kelley MRN: [id number] DOB: 09-06-1956 Today's Date: 11/22/2020    History of Present Illness Pt is a 64yo M admitted to [facility] on 11/22/20 for c/o worsening dyspnea with associated productive cough, wheezing, and mid sternal chest pain, not responding to home bronchodilator therapy. Pt hypoxic in the 80's and placed on BiPAP. Imaging unremarkable.   Clinical Impression   Pt seen for OT evaluation this date. Prior to admission, pt was independent in ADLs, simple home maintenance, and functional mobility without DME, living in a 1 story home. Pt lives with sister and her boyfriend, who help with IADLs and are available for supervision 24/7. Pt on 2L liters of O2 at home. This session, pt on 4L of O2, requiring SUPERVISION for functional mobility without DME and seated LB dressing due to chest pain and decreased cardiopulmonary tolerance. Pt would benefit from additional skilled OT services to maximize independence with functional activities and prevent further deconditioning. Upon discharge, recommend supervision/assistance 24/7.    Follow Up Recommendations  No OT follow up;Supervision/Assistance - 24 hour    Equipment Recommendations  None recommended by OT       Precautions / Restrictions Precautions Precautions: Fall Restrictions Weight Bearing Restrictions: No      Mobility Bed Mobility Overal bed mobility: Independent             General bed mobility comments: Ind with supine<>sit transfer    Transfers Overall transfer level: Needs assistance Equipment used: None Transfers: Sit to/from Stand Sit to Stand: Supervision         General transfer comment: Supervision for safety/line management to perform STS from ED stretcher bed, without AD.    Balance Overall balance assessment: Needs assistance Sitting-balance support: No upper extremity supported;Feet unsupported Sitting balance-Leahy Scale:  Normal Sitting balance - Comments: No LOB while seated EOB   Standing balance support: No upper extremity supported;During functional activity Standing balance-Leahy Scale: Good Standing balance comment: Good standing balance without UE support                           ADL either performed or assessed with clinical judgement   ADL Overall ADL's : Needs assistance/impaired                     Lower Body Dressing: Supervision/safety;Sitting/lateral leans Lower Body Dressing Details (indicate cue type and reason): to don shoes, with pt demonstrating increased work of breathing after task; SpO2>92% throughout             Functional mobility during ADLs: Supervision/safety General ADL Comments: Supervision d/t work of breathing with seated LB dressing and functional mobility within the room without DME; SpO2 > 92% throughout session, recieving 4L O2 via [place]. Anticipate MOD-I/SUPERVISION for all seated ADLs                  Pertinent Vitals/Pain Pain Assessment: 0-10 Pain Score: 10-Worst pain ever Pain Location: chest pain Pain Descriptors / Indicators: Sharp;Constant Pain Intervention(s): Monitored during session;Limited activity within patient's tolerance; RN made aware     Hand Dominance Right   Extremity/Trunk Assessment Upper Extremity Assessment Upper Extremity Assessment: Overall WFL for tasks assessed;LUE deficits/detail LUE Deficits / Details: Hx of shoulder pathology; shoulder flex < 90deg, unable to perform MMT of shoulder/elbow due to pain   Lower Extremity Assessment Lower Extremity Assessment: Overall WFL for tasks assessed   Cervical / Trunk Assessment Cervical /  Trunk Assessment: Normal   Communication Communication Communication: No difficulties   Cognition Arousal/Alertness: Awake/alert Behavior During Therapy: WFL for tasks assessed/performed Overall Cognitive Status: Within Functional Limits for tasks assessed                                  General Comments: Pt A&O x4 and able to follow 100% of 2-step commands   General Comments  Increased labored breathing, wheezing, and coughing noted with mobility; however, pt able to maintain SpO2 >90% throughout session on 4L    Exercises Other Exercises Other Exercises: Pt able to participate in bed mobility, transfers, marching, and ambulation without AD, grossly requiring supervision for safety/line management. x1 seated rest break due to DOE. Pt able to be Ind due to good balance and safety awareness. Increased labored breathing, wheezing, and coughing noted with mobility with SpO2 >90% throughout session on 5L. Other Exercises: Pt educated regarding: pulse ox significance/reading, pursed lip breathing, PT role/POC, DC recommendations, activity modification, pacing, safety with mobility; he verbalized understanding.   Shoulder Instructions      Home Living Family/patient expects to be discharged to:: Private residence Living Arrangements: Other relatives (Sister and sisters boyfriend) Available Help at Discharge: Family;Available 24 hours/day (Sister is disabled, but able to provide 24/7 supervision) Type of Home: Mobile home Home Access: Stairs to enter Entrance Stairs-Number of Steps: 3 Entrance Stairs-Rails: Right Home Layout: One level     Bathroom Shower/Tub: Producer, television/film/video: Standard         Additional Comments: oxygen and pulse ox      Prior Functioning/Environment Level of Independence: Independent;Needs assistance  Gait / Transfers Assistance Needed: Ind with short household ambulation; pt able to walk < 102ft before requiring seated rest break due to DOE ADL's / Homemaking Assistance Needed: Ind with ADL's,simple meals, and cleaning his room, but requires increased time/effort due to DOE. Requires assistance for other IADL's (groceries, cleaning, cooking, laundry, etc)            OT Problem List: Cardiopulmonary  status limiting activity         OT Goals(Current goals can be found in the care plan section) Acute Rehab OT Goals Patient Stated Goal: "to go home" OT Goal Formulation: With patient Time For Goal Achievement: 12/06/20 Potential to Achieve Goals: Good  OT Frequency: Min 1X/week    AM-PAC OT "6 Clicks" Daily Activity     Outcome Measure Help from another person eating meals?: None Help from another person taking care of personal grooming?: None Help from another person toileting, which includes using toliet, bedpan, or urinal?: A Little Help from another person bathing (including washing, rinsing, drying)?: A Little Help from another person to put on and taking off regular upper body clothing?: None Help from another person to put on and taking off regular lower body clothing?: A Little 6 Click Score: 21   End of Session Equipment Utilized During Treatment: Oxygen (4L) Nurse Communication: Mobility status  Activity Tolerance: Patient tolerated treatment well Patient left: in bed;with call bell/phone within reach  OT Visit Diagnosis: Unsteadiness on feet (R26.81)                Time: 5009-3818 OT Time Calculation (min): 17 min Charges:  OT General Charges $OT Visit: 1 Visit OT Evaluation $OT Eval Moderate Complexity: 1 Mod  Matthew Folks, OTR/L [facility] [phone]

## 2020-11-22 NOTE — ED Notes (Addendum)
Pt states to this RN that he is having chest pain and is grabbing chest, MD aware, pt offered tylenol that was ordered PRN, pt refused at this time, MD aware.   Pt currently eating lunch with NAD noted and not grabbing chest at this time.

## 2020-11-22 NOTE — Progress Notes (Signed)
OT Cancellation Note  Patient Details Name: Bryan Kelley MRN: [id number] DOB: 1956-09-06   Cancelled Treatment:    Reason Eval/Treat Not Completed: Medical issues which prohibited therapy. OT order received and chart reviewed. Pt currently on Bipap. OT to hold until pt is able  to actively participate in therapeutic intervention. OT to follow up when pt is more medically appropriate.   Jackquline Denmark, MS, OTR/L , CBIS ascom [phone]  11/22/20, 8:11 AM  11/22/2020, 8:09 AM

## 2020-11-22 NOTE — ED Notes (Signed)
Pt resting comfortably at this time. NAD noted at this time. Pt took off biPAP at this time and placed on [place] 6L/min while eating. Pt requests OJ, and pt given at this time as well as breakfast tray. NAD noted. Call bell in reach.

## 2020-11-22 NOTE — ED Provider Notes (Signed)
[facility] Emergency Department Provider Note  ____________________________________________  Time seen: Approximately 3:56 AM  I have reviewed the triage vital signs and the nursing notes.   HISTORY  Chief Complaint Shortness of Breath ([facility] for shortness of breath and mid sternal chest pain)   HPI Bryan Kelley is a 65 y.o. male with a history of asthma, COPD, GERD, alcohol abuse, hypertension who presents for evaluation of shortness of breath.  Patient has had shortness of breath for the last several days.  Patient has had several admissions for similar presentation with COPD.  Is complaining of severe chest tightness.  Has been coughing thick white phlegm.  Has had nausea but no vomiting.  No back pain, no abdominal pain, no fever or chills.  No known exposures to COVID.  His shortness of breath this evening became severe and constant.  Patient was hypoxic in the 80s per EMS.  Received 2 breathing treatments and Solu-Medrol.   Past Medical History:  Diagnosis Date  . Asthma   . COPD (chronic obstructive pulmonary disease) (HCC)   . Depression   . GERD (gastroesophageal reflux disease)   . Vision loss, left eye     Patient Active Problem List   Diagnosis Date Noted  . Acute respiratory failure (HCC) 10/06/2020  . Tobacco abuse 07/25/2020  . HTN (hypertension) 07/25/2020  . Acute on chronic respiratory failure with hypoxia (HCC) 12/06/2019  . Nausea vomiting and diarrhea 12/06/2019  . Acute respiratory disease due to COVID-19 virus 12/06/2019  . COPD (chronic obstructive pulmonary disease) (HCC) 10/30/2019  . Hyperglycemia   . COPD with acute exacerbation (HCC) 10/29/2019  . COPD exacerbation (HCC) 10/29/2019  . Epigastric abdominal pain 08/12/2012  . Alcohol abuse 03/14/2012  . Elevated BP 12/07/2011  . ED (erectile dysfunction) 07/28/2011  . NICOTINE ADDICTION 10/29/2009  . ACUTE BRONCHITIS 05/07/2009  . 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 ANKLE PAIN, RIGHT 10/04/2008  . FATIGUE 10/04/2008  . ANXIETY 02/17/2008  . ASTHMA 02/17/2008  . GERD 02/17/2008  . BPH (benign prostatic hyperplasia) 02/17/2008    Past Surgical History:  Procedure Laterality Date  . EYE SURGERY  2009, about [id number]   steel in left eye on the job, legally blind     Prior to Admission medications   Medication Sig Start Date End Date Taking? Authorizing Provider  albuterol (PROVENTIL) (2.5 MG/3ML) 0.083% nebulizer solution Take 2.5 mg by nebulization every 4 (four) hours as needed for wheezing. 06/06/20   [provider]  albuterol (VENTOLIN HFA) 108 (90 Base) MCG/ACT inhaler Inhale 2 puffs into the lungs every 6 (six) hours as needed for wheezing or shortness of breath. Patient not taking: Reported on 09/09/2020 11/30/19   Nita SickleVeronese, [place], MD  DULERA 200-5 MCG/ACT AERO Inhale 2 puffs into the lungs 2 (two) times daily. 09/12/20   Almon HerculesGonfa, Taye T, MD  guaiFENesin (MUCINEX) 600 MG 12 hr tablet Take 1 tablet (600 mg total) by mouth 2 (two) times daily. 10/07/20   Arnetha CourserAmin, Sumayya, MD  montelukast (SINGULAIR) 10 MG tablet Take 10 mg by mouth daily. 09/25/20   [provider]  nicotine (NICODERM CQ - DOSED IN MG/24 HOURS) 21 mg/24hr patch Place 1 patch (21 mg total) onto the skin daily. Patient not taking: Reported on 09/09/2020 07/29/20   Esaw GrandchildGriffith, Kelly A, DO  Tiotropium Bromide Monohydrate (SPIRIVA RESPIMAT) 2.5 MCG/ACT AERS Inhale 2 puffs into the lungs daily. 07/30/20   Glenford BayleyWalsh, Elizabeth W, NP  VIAGRA 50  MG tablet Take 50 mg by mouth as needed for erectile dysfunction. Patient not taking: Reported on 09/09/2020 03/03/20   [provider]    Allergies Patient has no known allergies.  Family History  Problem Relation Age of Onset  . Diabetes Mother   . Hypertension Mother   . Stroke Mother   . Asthma Father   . Diabetes Father   . Diabetes Sister   . Hypertension Sister   . Diabetes Brother   .  Hypertension Brother     Social History Social History   Tobacco Use  . Smoking status: Current Some Day Smoker    Packs/day: 0.50    Years: 40.00    Pack years: 20.00    Types: Cigarettes    Last attempt to quit: 2020    Years since quitting: 2.0  . Smokeless tobacco: Never Used  Vaping Use  . Vaping Use: Never used  Substance Use Topics  . Alcohol use: Yes  . Drug use: No    Review of Systems  Constitutional: Negative for fever. Eyes: Negative for visual changes. ENT: Negative for sore throat. Neck: No neck pain  Cardiovascular: Negative for chest pain. + chest tightness Respiratory: + shortness of breath, cough Gastrointestinal: Negative for abdominal pain, vomiting or diarrhea. + nausea Genitourinary: Negative for dysuria. Musculoskeletal: Negative for back pain. Skin: Negative for rash. Neurological: Negative for headaches, weakness or numbness. Psych: No SI or HI  ____________________________________________   PHYSICAL EXAM:  VITAL SIGNS: ED Triage Vitals [11/22/20 0354]  Enc Vitals Group     BP      Pulse      Resp      Temp      Temp src      SpO2 100 %     Weight      Height      Head Circumference      Peak Flow      Pain Score      Pain Loc      Pain Edu?      Excl. in GC?     Constitutional: Alert and oriented, severe respiratory distress.  HEENT:      Head: Normocephalic and atraumatic.         Eyes: Conjunctivae are normal. Sclera is non-icteric.       Mouth/Throat: Mucous membranes are moist.       Neck: Supple with no signs of meningismus. Cardiovascular: Tachycardic with regular rhythm.  No JVD.  2+ symmetric pulses x4.  Respiratory: Patient in severe respiratory distress, using accessory muscles of respiration, tripoding, tachypneic, hypoxic, severely diminished air movement bilaterally with faint wheezing.  Gastrointestinal: Soft, non tender, and non distended. Musculoskeletal: No edema, cyanosis, or erythema of  extremities. Neurologic: Normal speech and language. Face is symmetric. Moving all extremities. No gross focal neurologic deficits are appreciated. Skin: Skin is warm, dry and intact. No rash noted. Psychiatric: Mood and affect are normal. Speech and behavior are normal.  ____________________________________________   LABS (all labs ordered are listed, but only abnormal results are displayed)  Labs Reviewed  BLOOD GAS, VENOUS - Abnormal; Notable for the following components:      Result Value   pCO2, Ven 68 (*)    Bicarbonate 37.5 (*)    Acid-Base Excess 8.9 (*)    All other components within normal limits  COMPREHENSIVE METABOLIC PANEL - Abnormal; Notable for the following components:   CO2 33 (*)    Glucose, Bld 145 (*)  All other components within normal limits  RESP PANEL BY RT-PCR (FLU A&B, COVID) ARPGX2  CBC WITH DIFFERENTIAL/PLATELET  BRAIN NATRIURETIC PEPTIDE  TROPONIN I (HIGH SENSITIVITY)   ____________________________________________  EKG  ED ECG REPORT I, Nita Sickle, the attending physician, personally viewed and interpreted this ECG.  Sinus tachycardia, prolonged QTC, no ST elevations.  EKG with significant amount of artifact due to patient's respiratory status ____________________________________________  RADIOLOGY  I have personally reviewed the images performed during this visit and I agree with the Radiologist's read.   Interpretation by Radiologist:  DG Chest Portable 1 View  Result Date: 11/22/2020 CLINICAL DATA:  Shortness of breath EXAM: PORTABLE CHEST 1 VIEW COMPARISON:  October 05, 2020. FINDINGS: The heart size and mediastinal contours are within normal limits. Both lungs are clear. The visualized skeletal structures are unremarkable. IMPRESSION: No active disease. Electronically Signed   By: Jonna Clark M.D.   On: 11/22/2020 03:55     ____________________________________________   PROCEDURES  Procedure(s) performed:yes .1-3 Lead  EKG Interpretation Performed by: Nita Sickle, MD Authorized by: Nita Sickle, MD     Interpretation: non-specific     ECG rate assessment: tachycardic     Rhythm: sinus tachycardia     Ectopy: none     Critical Care performed: yes  CRITICAL CARE Performed by: Nita Sickle  ?  Total critical care time: 45 min  Critical care time was exclusive of separately billable procedures and treating other patients.  Critical care was necessary to treat or prevent imminent or life-threatening deterioration.  Critical care was time spent personally by me on the following activities: development of treatment plan with patient and/or surrogate as well as nursing, discussions with consultants, evaluation of patient's response to treatment, examination of patient, obtaining history from patient or surrogate, ordering and performing treatments and interventions, ordering and review of laboratory studies, ordering and review of radiographic studies, pulse oximetry and re-evaluation of patient's condition.  ____________________________________________   INITIAL IMPRESSION / ASSESSMENT AND PLAN / ED COURSE   65 y.o. male with a history of asthma, COPD, GERD, alcohol abuse, hypertension who presents for evaluation of shortness of breath.  Patient arrives in severe respiratory distress, hypoxic on a nonrebreather, tripoding, using accessory muscles of respiration, severely diminished air movement bilaterally with wheezing.  Patient looks euvolemic and has no history of CHF.  Patient placed on BiPAP immediately on arrival.  Patient started on duo nebs.  Received Solu-Medrol per EMS.  IV magnesium since he has asthma as well.  Chest x-ray visualized by me showing no evidence of pneumothorax or pneumonia, confirmed by radiology.  EKG showing no acute ischemic changes.  COVID and flu pending.  Labs pending.  Ddx Copd, vs covid, flu, bronchitis, pna, ptx, pe, dissection, acs, chf  Patient  placed on telemetry for close monitoring of respiratory and cardiac status.  Old medical records review including patient's most recent admission to the hospital last month for similar presentation.  _________________________ 5:14 AM on 11/22/2020 -----------------------------------------  COVID and flu negative.  VBG consistent with hypercapnic respiratory failure.  Patient be admitted for acute hypoxic and hypercapnic respiratory failure in the setting of severe COPD exacerbation.  Will cover with Rocephin.  Patient's respiratory status markedly improved on BiPAP.  Will discuss with the hospitalist for admission    _____________________________________________ Please note:  Patient was evaluated in Emergency Department today for the symptoms described in the history of present illness. Patient was evaluated in the context of the global COVID-19 pandemic, which  necessitated consideration that the patient might be at risk for infection with the SARS-CoV-2 virus that causes COVID-19. Institutional protocols and algorithms that pertain to the evaluation of patients at risk for COVID-19 are in a state of rapid change based on information released by regulatory bodies including the CDC and federal and state organizations. These policies and algorithms were followed during the patient's care in the ED.  Some ED evaluations and interventions may be delayed as a result of limited staffing during the pandemic.   [place] Controlled Substance Database was reviewed by me. ____________________________________________   FINAL CLINICAL IMPRESSION(S) / ED DIAGNOSES   Final diagnoses:  Acute respiratory failure with hypoxia and hypercapnia (HCC)  COPD exacerbation (HCC)      NEW MEDICATIONS STARTED DURING THIS VISIT:  ED Discharge Orders    None       Note:  This document was prepared using Dragon voice recognition software and may include unintentional dictation errors.    Nita Sickle,  MD 11/22/20 [phone]

## 2020-11-23 ENCOUNTER — Encounter: Payer: Self-pay | Admitting: Internal Medicine

## 2020-11-23 MED ORDER — ENSURE ENLIVE PO LIQD
237.0000 mL | Freq: Two times a day (BID) | ORAL | Status: DC
  Administered 2020-11-23: 237 mL via ORAL

## 2020-11-23 NOTE — Progress Notes (Signed)
Initial Nutrition Assessment  RD working remotely.  DOCUMENTATION CODES:   Not applicable  INTERVENTION:  - will order Ensure Enlive BID, each supplement provides 350 kcal and 20 grams of protein. - will liberalize diet from Heart Healthy to Regular. - weigh patient today.    NUTRITION DIAGNOSIS:   Increased nutrient needs related to acute illness as evidenced by estimated needs.  GOAL:   Patient will meet greater than or equal to 90% of their needs  MONITOR:   PO intake,Labs,Weight trends  REASON FOR ASSESSMENT:   Consult Assessment of nutrition requirement/status  ASSESSMENT:   66 y.o. male with medical history of COPD (2L at home), asthma, depression, and GERD. He presented to the ED with acute onset of worsening dyspnea, productive cough, upper epigastric pain, and wheezing.  Unable to reach patient by phone. No intakes documented since diet order placed before breakfast yesterday.   Last [place] RD note was on 10/31/19.   Weight yesterday was 183 lb and weight appears to have been copied forward from weight recorded on 10/07/20. No information documented in the edema section of flow sheet.   Per notes: - COPD exacerbation - atypical chest pain which appears to be related to GERD and coughing   Labs reviewed. Medications reviewed; 40 mg deltasone/day, 20 mg oral protonix/day.     NUTRITION - FOCUSED PHYSICAL EXAM:  unable to complete at this time.   Diet Order:   Diet Order            Diet regular Room service appropriate? Yes; Fluid consistency: Thin  Diet effective now                 EDUCATION NEEDS:   Not appropriate for education at this time  Skin:  Skin Assessment: Reviewed RN Assessment  Last BM:  PTA/unknown  Height:   Ht Readings from Last 1 Encounters:  11/22/20 5\' 6"  (1.676 m)    Weight:   Wt Readings from Last 1 Encounters:  11/22/20 83 kg     Estimated Nutritional Needs:  Kcal:  1700-1900 kcal Protein:  80-95  grams Fluid:  >/= 1.8 L/day      11/24/20, MS, RD, LDN, CNSC Inpatient Clinical Dietitian RD pager # available in [facility]  After hours/weekend pager # available in [facility] [facility]

## 2020-11-23 NOTE — Progress Notes (Signed)
Physical Therapy Treatment Patient Details Name: Bryan Kelley MRN: [id number] DOB: [birth year] Today's Date: 11/23/2020    History of Present Illness Pt is a 65yo M admitted to [facility] on 11/22/20 for c/o worsening dyspnea with associated productive cough, wheezing, and mid sternal chest pain, not responding to home bronchodilator therapy. Pt hypoxic in the 80's and placed on BiPAP. Imaging unremarkable.    PT Comments    Pt received supine in bed alert/oriented and willing to participate with PT interventions today. Pt demonstrated safe bed mobility I with no cueing needed. Completed sit<>stand x10 transfer with SBA for safety, pt required seated rest break during sit<>stands due to SOB SpO2 stayed >95% throughout session. Completed therapeutic exercises in the standing position: heel raises x10, alternating knee marches x20, hip extension x10 each leg, hip ABD x10 each leg pt provided verbal cues for form and to control eccentric/concentric portion of therex as he tends to complete exercises with a fast pace. Pt required seated rest break following standing activities due to SOB, but he was able to tolerate interventions well today with noted increase in his fatigue, but maintaining his SpO2 >95% throughout activity.  Follow Up Recommendations        Equipment Recommendations  None recommended by PT    Recommendations for Other Services       Precautions / Restrictions Precautions Precautions: Fall Restrictions Weight Bearing Restrictions: No    Mobility  Bed Mobility Overal bed mobility: Independent             General bed mobility comments: Ind with supine<>sit transfer  Transfers Overall transfer level: Needs assistance Equipment used: None Transfers: Sit to/from Stand Sit to Stand: Supervision         General transfer comment: Supervision for sit<>stand transfer for safety, pt tends to complete activities with a fast pace  Ambulation/Gait                  Stairs             Wheelchair Mobility    Modified Rankin (Stroke Patients Only)       Balance Overall balance assessment: Needs assistance Sitting-balance support: No upper extremity supported;Feet unsupported Sitting balance-Leahy Scale: Normal Sitting balance - Comments: Pt demonstrated safe standing balance EOB today during static standing   Standing balance support: No upper extremity supported;During functional activity Standing balance-Leahy Scale: Good Standing balance comment: Pt demonstrated good standing balance for standing activities today, required intermittent touch to stay standing during dynamic standing activities, no LOB                            Cognition Arousal/Alertness: Awake/alert Behavior During Therapy: WFL for tasks assessed/performed Overall Cognitive Status: Within Functional Limits for tasks assessed                                 General Comments: Pt alert and oriented willing to participate with PT interventions and able to follow mulit step commands appropriately      Exercises General Exercises - Lower Extremity Hip Flexion/Marching: AROM;Strengthening;Both;10 reps (Pt required VCs for pursed lip breathing) Heel Raises: AROM;Strengthening;Both;10 reps (Pt required VCs for form and pace) Other Exercises Other Exercises: Pt is able to complete all bed mobility independently and safely without LOB, completed sit<>stand x10 with VCs for pursed lip breathing and pace (pt tends to complete exercises with a  fast pace) he required a seated rest 2x during session 2/2 SOB SpO2: >95% throughout interventions. Pt with increased fatigue following therapeutic exercises today.    General Comments General comments (skin integrity, edema, etc.): Increased SOB , notable wheezing requiring multiple rest breaks during interventions, pts SpO2 remained >95% throughout the session on 2L today      Pertinent Vitals/Pain Pain  Assessment: Faces Faces Pain Scale: Hurts a little bit Pain Intervention(s): Monitored during session    Home Living Family/patient expects to be discharged to:: Private residence Living Arrangements: Other relatives Available Help at Discharge: Family;Available 24 hours/day Type of Home: Mobile home Home Access: Stairs to enter Entrance Stairs-Rails: Right Home Layout: One level        Prior Function Level of Independence: Independent          PT Goals (current goals can now be found in the care plan section) Acute Rehab PT Goals Patient Stated Goal: Return home PT Goal Formulation: With patient Time For Goal Achievement: 12/06/20 Potential to Achieve Goals: Good Progress towards PT goals: Progressing toward goals    Frequency    Min 2X/week      PT Plan Current plan remains appropriate    Co-evaluation              AM-PAC PT "6 Clicks" Mobility   Outcome Measure  Help needed turning from your back to your side while in a flat bed without using bedrails?: None Help needed moving from lying on your back to sitting on the side of a flat bed without using bedrails?: None Help needed moving to and from a bed to a chair (including a wheelchair)?: A Little Help needed standing up from a chair using your arms (e.g., wheelchair or bedside chair)?: A Little Help needed to walk in hospital room?: A Little Help needed climbing 3-5 steps with a railing? : A Little 6 Click Score: 20    End of Session Equipment Utilized During Treatment: Oxygen Activity Tolerance: Patient tolerated treatment well;Patient limited by fatigue Patient left: in bed;with call bell/phone within reach Nurse Communication: Mobility status PT Visit Diagnosis: Other (comment);Pain (Cardiopulmonary tolerance to functional activity)     Time: 1610-9604 PT Time Calculation (min) (ACUTE ONLY): 22 min  Charges:  $Therapeutic Exercise: 8-22 mins                     Minette Headland, PT,  DPT 11/23/20, 3:14 PM    Izaya Netherton Marcelle Overlie 11/23/2020, 3:09 PM

## 2020-11-23 NOTE — Progress Notes (Signed)
Upon arrival to deliver nebulizer treatment, patient expresses that he is upset he is not receiving more doses during each treatment. Patient states that he takes 3-4 doses of his medication for [facility] treatment. I explained that we do not over medicate with that many doses of Albuterol or Duoneb at one time. I explained that he currently has a Duoneb treatments ordered every 6 hours per MD order. Will continue to monitor.

## 2020-11-24 MED ORDER — AZITHROMYCIN 500 MG PO TABS: 500.0000 mg | ORAL_TABLET | Freq: Every day | ORAL | Status: DC

## 2020-11-24 MED ORDER — PANTOPRAZOLE SODIUM 20 MG PO TBEC: 20.0000 mg | DELAYED_RELEASE_TABLET | Freq: Every day | ORAL | 0 refills | Status: DC

## 2020-11-24 MED ORDER — AMOXICILLIN-POT CLAVULANATE 875-125 MG PO TABS: 1.0000 | ORAL_TABLET | Freq: Two times a day (BID) | ORAL | 0 refills | Status: AC

## 2020-11-24 MED ORDER — PREDNISONE 20 MG PO TABS: 20.0000 mg | ORAL_TABLET | Freq: Every day | ORAL | 0 refills | Status: DC

## 2020-11-24 MED ORDER — PREDNISONE 20 MG PO TABS
40.0000 mg | ORAL_TABLET | Freq: Every day | ORAL | 0 refills | Status: AC
Start: 2020-11-25 — End: 2020-12-04

## 2020-11-24 NOTE — Progress Notes (Signed)
PROGRESS NOTE    Bryan Kelley  PYP:[id number] DOB: [birth year] DOA: 11/22/2020 PCP: [name]    Brief Narrative:  Bryan Stockert Kingis a 65 y.o.malewith medical history significant forCOPD chronically on 2 L at home , asthma, depression and GERD, who presented to the emergency room with acute onset of worsening dyspnea with associatedproductive coughand wheezingnot responding to home bronchodilator therapy.O2 sat with EMS was in the mid 80s.On arrival he was placed on BiPAP due to respiratory distress. He was afebrile BP 139/84 pulse 101 respirations 22, O2 sat 100% on BiPAP. Venous pH significant for PCO2 of 68.  CC: still sob and wheezy    Consultants:     Procedures:   Antimicrobials:   Ceft. And azith    Subjective: Still with cough, feels like still with sob, no cp  Objective: Vitals:   11/23/20 1953 11/23/20 2332 11/24/20 0529 11/24/20 0750  BP: 136/86 (!) 152/97 (!) 157/89 (!) 160/87  Pulse: 81 79 76 87  Resp: 16 17 18 20   Temp: 97.9 F (36.6 C) 98.3 F (36.8 C) 98.4 F (36.9 C) 98.6 F (37 C)  TempSrc:    Oral  SpO2: 100% 99% 99% 97%  Weight:      Height:        Intake/Output Summary (Last 24 hours) at 11/24/2020 1254 Last data filed at 11/24/2020 0500 Gross per 24 hour  Intake 406.24 ml  Output 200 ml  Net 206.24 ml   Filed Weights   11/22/20 0358  Weight: 83 kg    Examination:  General exam: Appears calm and comfortable  Respiratory system: minimal forced expiratory wheezing, no rales Cardiovascular system: S1 & S2 heard, RRR. No JVD, murmurs, rubs, gallops or clicks.  Gastrointestinal system: Abdomen is nondistended, soft and nontender. No organomegaly or masses felt. Normal bowel sounds heard. Central nervous system: Alert and oriented. No focal neurological deficits. Extremities:no edema Psychiatry: Judgement and insight appear normal. Mood & affect appropriate.     Data Reviewed: I have personally  reviewed following labs and imaging studies  CBC: Recent Labs  Lab 11/22/20 0409  WBC 7.9  NEUTROABS 4.2  HGB 15.4  HCT 45.2  MCV 88.5  PLT 232   Basic Metabolic Panel: Recent Labs  Lab 11/22/20 0409  NA 144  K 3.6  CL 100  CO2 33*  GLUCOSE 145*  BUN 18  CREATININE 1.14  CALCIUM 9.7   GFR: Estimated Creatinine Clearance: 67.1 mL/min (by C-G formula based on SCr of 1.14 mg/dL). Liver Function Tests: Recent Labs  Lab 11/22/20 0409  AST 30  ALT 25  ALKPHOS 85  BILITOT 0.7  PROT 7.7  ALBUMIN 4.3   No results for input(s): LIPASE, AMYLASE in the last 168 hours. No results for input(s): AMMONIA in the last 168 hours. Coagulation Profile: No results for input(s): INR, PROTIME in the last 168 hours. Cardiac Enzymes: No results for input(s): CKTOTAL, CKMB, CKMBINDEX, TROPONINI in the last 168 hours. BNP (last 3 results) No results for input(s): PROBNP in the last 8760 hours. HbA1C: No results for input(s): HGBA1C in the last 72 hours. CBG: No results for input(s): GLUCAP in the last 168 hours. Lipid Profile: No results for input(s): CHOL, HDL, LDLCALC, TRIG, CHOLHDL, LDLDIRECT in the last 72 hours. Thyroid Function Tests: No results for input(s): TSH, T4TOTAL, FREET4, T3FREE, THYROIDAB in the last 72 hours. Anemia Panel: No results for input(s): VITAMINB12, FOLATE, FERRITIN, TIBC, IRON, RETICCTPCT in the last 72 hours. Sepsis Labs:  No results for input(s): PROCALCITON, LATICACIDVEN in the last 168 hours.  Recent Results (from the past 240 hour(s))  Resp Panel by RT-PCR (Flu A&B, Covid) Nasopharyngeal Swab     Status: None   Collection Time: 11/22/20  3:45 AM   Specimen: Nasopharyngeal Swab; Nasopharyngeal(NP) swabs in vial transport medium  Result Value Ref Range Status   SARS Coronavirus 2 by RT PCR NEGATIVE NEGATIVE Final    Comment: (NOTE) SARS-CoV-2 target nucleic acids are NOT DETECTED.  The SARS-CoV-2 RNA is generally detectable in upper  respiratory specimens during the acute phase of infection. The lowest concentration of SARS-CoV-2 viral copies this assay can detect is 138 copies/mL. A negative result does not preclude SARS-Cov-2 infection and should not be used as the sole basis for treatment or other patient management decisions. A negative result may occur with  improper specimen collection/handling, submission of specimen other than nasopharyngeal swab, presence of viral mutation(s) within the areas targeted by this assay, and inadequate number of viral copies(<138 copies/mL). A negative result must be combined with clinical observations, patient history, and epidemiological information. The expected result is Negative.  Fact Sheet for Patients:  BloggerCourse.com  Fact Sheet for Healthcare Providers:  SeriousBroker.it  This test is no t yet approved or cleared by the [place] FDA and  has been authorized for detection and/or diagnosis of SARS-CoV-2 by FDA under an Emergency Use Authorization (EUA). This EUA will remain  in effect (meaning this test can be used) for the duration of the COVID-19 declaration under Section 564(b)(1) of the Act, 21 U.S.C.section 360bbb-3(b)(1), unless the authorization is terminated  or revoked sooner.       Influenza A by PCR NEGATIVE NEGATIVE Final   Influenza B by PCR NEGATIVE NEGATIVE Final    Comment: (NOTE) The Xpert Xpress SARS-CoV-2/FLU/RSV plus assay is intended as an aid in the diagnosis of influenza from Nasopharyngeal swab specimens and should not be used as a sole basis for treatment. Nasal washings and aspirates are unacceptable for Xpert Xpress SARS-CoV-2/FLU/RSV testing.  Fact Sheet for Patients: BloggerCourse.com  Fact Sheet for Healthcare Providers: SeriousBroker.it  This test is not yet approved or cleared by the [place] FDA and has been  authorized for detection and/or diagnosis of SARS-CoV-2 by FDA under an Emergency Use Authorization (EUA). This EUA will remain in effect (meaning this test can be used) for the duration of the COVID-19 declaration under Section 564(b)(1) of the Act, 21 U.S.C. section 360bbb-3(b)(1), unless the authorization is terminated or revoked.  Performed at [facility], [address], [place], [place] [address]          Radiology Studies: No results found.      Scheduled Meds: . [START ON 11/25/2020] azithromycin  500 mg Oral Daily  . enoxaparin (LOVENOX) injection  40 mg Subcutaneous Q24H  . feeding supplement  237 mL Oral BID BM  . ipratropium-albuterol  3 mL Nebulization Q6H  . pantoprazole  20 mg Oral Daily  . predniSONE  40 mg Oral Q breakfast   Continuous Infusions: . cefTRIAXone (ROCEPHIN)  IV 1 g (11/24/20 1207)    Assessment & Plan:   Active Problems:   COPD with acute exacerbation (HCC)   COPD exacerbation (HCC)   Acute on chronic respiratory failure with hypercapnia (HCC)   HTN (hypertension)   COPD with acute exacerbation (HCC) Acute on chronic respiratory failure with hypercapniaand hypoxia(HCC) -Patient on home O2 at 2 L with COPD exacerbation with O2 sats in mid 80s  with EMS requiring BiPAP on arrival, required bipap initially 1/21- continue iv steroid, start taper tomorrow Continue iv abx Wean 02 to baseline and get ambulatory 02   Atypical chest pain More musculoskeletal in nature. Worse with coughing. 1/21-TP and BNP negative.    GERD -continue PPI  Tobacco abuse -Tobacco cessation counseling done    DVT prophylaxis: Lovenox Code Status: Full Family Communication: None at bedside  Status is: Inpatient  Remains inpatient appropriate because:IV treatments appropriate due to intensity of illness or inability to take PO   Dispo: The patient is from: Home              Anticipated d/c is to: Home               Anticipated d/c date is: 1 day              Patient currently is not medically stable to d/c.   Difficult to place patient No            LOS: 2 days   Time spent: 35 min with >50% on coc    Lynn Ito, MD [facility] Pager [phone]  If 7PM-7AM, please contact night-coverage 11/24/2020, 12:54 PM

## 2020-11-24 NOTE — Plan of Care (Signed)
  Problem: Clinical Measurements: Goal: Ability to maintain clinical measurements within normal limits will improve Outcome: Progressing Goal: Respiratory complications will improve Outcome: Progressing   

## 2020-11-24 NOTE — Discharge Summary (Addendum)
[name] ZTI:[id number] DOB: 1956/01/25 DOA: 11/22/2020  PCP: [name]  Admit date: 11/22/2020 Discharge date: 11/24/2020  Admitted From: home Disposition:  home  Recommendations for Outpatient Follow-up:  1. Follow up with PCP in 1 week 2. Please obtain BMP/CBC in one week      Discharge Condition:Stable CODE STATUS:full  Diet recommendation: Heart Healthy Brief/Interim Summary: HPI: Bryan Kelley is a 65 y.o. male with medical history significant for COPD chronically on 2 L at home , asthma, depression and GERD, who presented to the emergency room with acute onset of worsening dyspnea with associated productive coughand wheezing not responding to home bronchodilator therapy.O2 sat with EMS was in the mid 80s.On arrival he was placed on BiPAP due to respiratory distress.  He was afebrile BP 139/84 pulse 101 respirations 22, O2 sat 100% on BiPAP.  Venous pH significant for PCO2 of 68  COPD with acute exacerbation (HCC)   Acute on chronic respiratory failure with hypercapnia and hypoxia (HCC) - Patient on home O2 at 2 L with COPD exacerbation with O2 sats in mid 80s with EMS requiring BiPAP on arrival, required bipap initially Received iv steroid and iv abx Will d/c with po steroid taper covid negative Weaned down to baseline 2L 02 with ambulation 97%   Atypical chest pain More musculoskeletal in nature. Worse with coughing. Tp negative, bnp low   GERD - continue home PPI  Tobacco abuse - Tobacco cessation counseled especially being on home 02.  Addendum-pt had a very short run of svt , remained asx. Will setupt with cardiology as outpt  Discharge Diagnoses:  Active Problems:   COPD with acute exacerbation (HCC)   COPD exacerbation (HCC)   Acute on chronic respiratory failure with hypercapnia (HCC)   HTN (hypertension)    Discharge Instructions  Discharge Instructions    Call MD for:  temperature >100.4   Complete by: As  directed    Diet - low sodium heart healthy   Complete by: As directed    Increase activity slowly   Complete by: As directed      Allergies as of 11/24/2020   No Known Allergies     Medication List    TAKE these medications   albuterol 108 (90 Base) MCG/ACT inhaler Commonly known as: VENTOLIN HFA Inhale 2 puffs into the lungs every 6 (six) hours as needed for wheezing or shortness of breath.   albuterol (2.5 MG/3ML) 0.083% nebulizer solution Commonly known as: PROVENTIL Take 2.5 mg by nebulization every 4 (four) hours as needed for wheezing.   amoxicillin-clavulanate 875-125 MG tablet Commonly known as: Augmentin Take 1 tablet by mouth every 12 (twelve) hours for 3 days.   Dulera 200-5 MCG/ACT Aero Generic drug: mometasone-formoterol Inhale 2 puffs into the lungs 2 (two) times daily.   guaiFENesin 600 MG 12 hr tablet Commonly known as: MUCINEX Take 1 tablet (600 mg total) by mouth 2 (two) times daily.   montelukast 10 MG tablet Commonly known as: SINGULAIR Take 10 mg by mouth daily.   nicotine 21 mg/24hr patch Commonly known as: NICODERM CQ - dosed in mg/24 hours Place 1 patch (21 mg total) onto the skin daily.   pantoprazole 20 MG tablet Commonly known as: PROTONIX Take 1 tablet (20 mg total) by mouth daily.   predniSONE 20 MG tablet Commonly known as: DELTASONE Take 1 tablet (20 mg total) by mouth daily with breakfast for 7 doses. Start taking on: November 25, 2020   Spiriva  Respimat 2.5 MCG/ACT Aers Generic drug: Tiotropium Bromide Monohydrate Inhale 2 puffs into the lungs daily.   Viagra 50 MG tablet Generic drug: sildenafil Take 50 mg by mouth as needed for erectile dysfunction.       Follow-up Information    [name] Follow up in 1 week(s).   Specialty: General Practice Contact information: [address] [place] [address] [phone]              No Known  Allergies  Consultations:     Procedures/Studies: DG Chest Portable 1 View  Result Date: 11/22/2020 CLINICAL DATA:  Shortness of breath EXAM: PORTABLE CHEST 1 VIEW COMPARISON:  October 05, 2020. FINDINGS: The heart size and mediastinal contours are within normal limits. Both lungs are clear. The visualized skeletal structures are unremarkable. IMPRESSION: No active disease. Electronically Signed   By: Jonna Clark M.D.   On: 11/22/2020 03:55      Subjective: No new complaints . At baseline 02 requirement with 02 .no cp  Discharge Exam: Vitals:   11/24/20 0529 11/24/20 0750  BP: (!) 157/89 (!) 160/87  Pulse: 76 87  Resp: 18 20  Temp: 98.4 F (36.9 C) 98.6 F (37 C)  SpO2: 99% 97%   Vitals:   11/23/20 1953 11/23/20 2332 11/24/20 0529 11/24/20 0750  BP: 136/86 (!) 152/97 (!) 157/89 (!) 160/87  Pulse: 81 79 76 87  Resp: 16 17 18 20   Temp: 97.9 F (36.6 C) 98.3 F (36.8 C) 98.4 F (36.9 C) 98.6 F (37 C)  TempSrc:    Oral  SpO2: 100% 99% 99% 97%  Weight:      Height:        General: Pt is alert, awake, not in acute distress Cardiovascular: RRR, S1/S2 +, no rubs, no gallops Respiratory: CTA bilaterally, no wheezing, no rhonchi Abdominal: Soft, NT, ND, bowel sounds + Extremities: no edema, no cyanosis    The results of significant diagnostics from this hospitalization (including imaging, microbiology, ancillary and laboratory) are listed below for reference.     Microbiology: Recent Results (from the past 240 hour(s))  Resp Panel by RT-PCR (Flu A&B, Covid) Nasopharyngeal Swab     Status: None   Collection Time: 11/22/20  3:45 AM   Specimen: Nasopharyngeal Swab; Nasopharyngeal(NP) swabs in vial transport medium  Result Value Ref Range Status   SARS Coronavirus 2 by RT PCR NEGATIVE NEGATIVE Final    Comment: (NOTE) SARS-CoV-2 target nucleic acids are NOT DETECTED.  The SARS-CoV-2 RNA is generally detectable in upper respiratory specimens during the acute  phase of infection. The lowest concentration of SARS-CoV-2 viral copies this assay can detect is 138 copies/mL. A negative result does not preclude SARS-Cov-2 infection and should not be used as the sole basis for treatment or other patient management decisions. A negative result may occur with  improper specimen collection/handling, submission of specimen other than nasopharyngeal swab, presence of viral mutation(s) within the areas targeted by this assay, and inadequate number of viral copies(<138 copies/mL). A negative result must be combined with clinical observations, patient history, and epidemiological information. The expected result is Negative.  Fact Sheet for Patients:  11/24/20  Fact Sheet for Healthcare Providers:  BloggerCourse.com  This test is no t yet approved or cleared by the [place] FDA and  has been authorized for detection and/or diagnosis of SARS-CoV-2 by FDA under an Emergency Use Authorization (EUA). This EUA will remain  in effect (meaning this test can be  used) for the duration of the COVID-19 declaration under Section 564(b)(1) of the Act, 21 U.S.C.section 360bbb-3(b)(1), unless the authorization is terminated  or revoked sooner.       Influenza A by PCR NEGATIVE NEGATIVE Final   Influenza B by PCR NEGATIVE NEGATIVE Final    Comment: (NOTE) The Xpert Xpress SARS-CoV-2/FLU/RSV plus assay is intended as an aid in the diagnosis of influenza from Nasopharyngeal swab specimens and should not be used as a sole basis for treatment. Nasal washings and aspirates are unacceptable for Xpert Xpress SARS-CoV-2/FLU/RSV testing.  Fact Sheet for Patients: BloggerCourse.comhttps://www.fda.gov/media/152166/download  Fact Sheet for Healthcare Providers: SeriousBroker.ithttps://www.fda.gov/media/152162/download  This test is not yet approved or cleared by the [place] FDA and has been authorized for detection and/or diagnosis of  SARS-CoV-2 by FDA under an Emergency Use Authorization (EUA). This EUA will remain in effect (meaning this test can be used) for the duration of the COVID-19 declaration under Section 564(b)(1) of the Act, 21 U.S.C. section 360bbb-3(b)(1), unless the authorization is terminated or revoked.  Performed at [facility], [address] [place], [place] [id number]      Labs: BNP (last 3 results) Recent Labs    07/25/20 0827 09/09/20 0500 11/22/20 0409  BNP 34.2 37.4 24.7   Basic Metabolic Panel: Recent Labs  Lab 11/22/20 0409  NA 144  K 3.6  CL 100  CO2 33*  GLUCOSE 145*  BUN 18  CREATININE 1.14  CALCIUM 9.7   Liver Function Tests: Recent Labs  Lab 11/22/20 0409  AST 30  ALT 25  ALKPHOS 85  BILITOT 0.7  PROT 7.7  ALBUMIN 4.3   No results for input(s): LIPASE, AMYLASE in the last 168 hours. No results for input(s): AMMONIA in the last 168 hours. CBC: Recent Labs  Lab 11/22/20 0409  WBC 7.9  NEUTROABS 4.2  HGB 15.4  HCT 45.2  MCV 88.5  PLT 232   Cardiac Enzymes: No results for input(s): CKTOTAL, CKMB, CKMBINDEX, TROPONINI in the last 168 hours. BNP: Invalid input(s): POCBNP CBG: No results for input(s): GLUCAP in the last 168 hours. D-Dimer No results for input(s): DDIMER in the last 72 hours. Hgb A1c No results for input(s): HGBA1C in the last 72 hours. Lipid Profile No results for input(s): CHOL, HDL, LDLCALC, TRIG, CHOLHDL, LDLDIRECT in the last 72 hours. Thyroid function studies No results for input(s): TSH, T4TOTAL, T3FREE, THYROIDAB in the last 72 hours.  Invalid input(s): FREET3 Anemia work up No results for input(s): VITAMINB12, FOLATE, FERRITIN, TIBC, IRON, RETICCTPCT in the last 72 hours. Urinalysis No results found for: COLORURINE, APPEARANCEUR, LABSPEC, PHURINE, GLUCOSEU, HGBUR, BILIRUBINUR, KETONESUR, PROTEINUR, UROBILINOGEN, NITRITE, LEUKOCYTESUR Sepsis Labs Invalid input(s): PROCALCITONIN,  WBC,   LACTICIDVEN Microbiology Recent Results (from the past 240 hour(s))  Resp Panel by RT-PCR (Flu A&B, Covid) Nasopharyngeal Swab     Status: None   Collection Time: 11/22/20  3:45 AM   Specimen: Nasopharyngeal Swab; Nasopharyngeal(NP) swabs in vial transport medium  Result Value Ref Range Status   SARS Coronavirus 2 by RT PCR NEGATIVE NEGATIVE Final    Comment: (NOTE) SARS-CoV-2 target nucleic acids are NOT DETECTED.  The SARS-CoV-2 RNA is generally detectable in upper respiratory specimens during the acute phase of infection. The lowest concentration of SARS-CoV-2 viral copies this assay can detect is 138 copies/mL. A negative result does not preclude SARS-Cov-2 infection and should not be used as the sole basis for treatment or other patient management decisions. A negative result may occur with  improper  specimen collection/handling, submission of specimen other than nasopharyngeal swab, presence of viral mutation(s) within the areas targeted by this assay, and inadequate number of viral copies(<138 copies/mL). A negative result must be combined with clinical observations, patient history, and epidemiological information. The expected result is Negative.  Fact Sheet for Patients:  BloggerCourse.com  Fact Sheet for Healthcare Providers:  SeriousBroker.it  This test is no t yet approved or cleared by the [place] FDA and  has been authorized for detection and/or diagnosis of SARS-CoV-2 by FDA under an Emergency Use Authorization (EUA). This EUA will remain  in effect (meaning this test can be used) for the duration of the COVID-19 declaration under Section 564(b)(1) of the Act, 21 U.S.C.section 360bbb-3(b)(1), unless the authorization is terminated  or revoked sooner.       Influenza A by PCR NEGATIVE NEGATIVE Final   Influenza B by PCR NEGATIVE NEGATIVE Final    Comment: (NOTE) The Xpert Xpress SARS-CoV-2/FLU/RSV plus  assay is intended as an aid in the diagnosis of influenza from Nasopharyngeal swab specimens and should not be used as a sole basis for treatment. Nasal washings and aspirates are unacceptable for Xpert Xpress SARS-CoV-2/FLU/RSV testing.  Fact Sheet for Patients: BloggerCourse.com  Fact Sheet for Healthcare Providers: SeriousBroker.it  This test is not yet approved or cleared by the [place] FDA and has been authorized for detection and/or diagnosis of SARS-CoV-2 by FDA under an Emergency Use Authorization (EUA). This EUA will remain in effect (meaning this test can be used) for the duration of the COVID-19 declaration under Section 564(b)(1) of the Act, 21 U.S.C. section 360bbb-3(b)(1), unless the authorization is terminated or revoked.  Performed at [facility], [address], [place], [place] [address]      Time coordinating discharge: Over 30 minutes  SIGNED:   Lynn Ito, MD  [facility] 11/24/2020, 12:51 PM Pager   If 7PM-7AM, please contact night-coverage [email or web address] Password [email or web address]

## 2020-11-24 NOTE — Progress Notes (Signed)
PHARMACIST - PHYSICIAN COMMUNICATION  CONCERNING: Antibiotic IV to Oral Route Change Policy  RECOMMENDATION: This patient is receiving Azithromycin by the intravenous route.  Based on criteria approved by the Pharmacy and Therapeutics Committee, the antibiotic(s) is/are being converted to the equivalent oral dose form(s).   DESCRIPTION: These criteria include:  Patient being treated for a respiratory tract infection, urinary tract infection, cellulitis or clostridium difficile associated diarrhea if on metronidazole  The patient is not neutropenic and does not exhibit a GI malabsorption state  The patient is eating (either orally or via tube) and/or has been taking other orally administered medications for a least 24 hours  The patient is improving clinically and has a Tmax < 100.5  If you have questions about this conversion, please contact the Pharmacy Department  Gardner Candle, PharmD, BCPS Clinical Pharmacist 11/24/2020 12:45 PM

## 2021-02-19 ENCOUNTER — Emergency Department: Payer: Medicaid Other

## 2021-02-19 ENCOUNTER — Other Ambulatory Visit: Payer: Self-pay

## 2021-02-19 ENCOUNTER — Inpatient Hospital Stay
Admission: EM | Admit: 2021-02-19 | Discharge: 2021-02-22 | DRG: 190 | Disposition: A | Discharge status: Home / Self Care | Payer: Medicaid Other | Attending: Internal Medicine | Admitting: Internal Medicine

## 2021-02-19 DIAGNOSIS — I10 Essential (primary) hypertension: Secondary | ICD-10-CM | POA: Diagnosis present

## 2021-02-19 DIAGNOSIS — R0602 Shortness of breath: Secondary | ICD-10-CM

## 2021-02-19 DIAGNOSIS — J441 Chronic obstructive pulmonary disease with (acute) exacerbation: Principal | ICD-10-CM | POA: Diagnosis present

## 2021-02-19 DIAGNOSIS — J9610 Chronic respiratory failure, unspecified whether with hypoxia or hypercapnia: Secondary | ICD-10-CM | POA: Diagnosis present

## 2021-02-19 DIAGNOSIS — Z8249 Family history of ischemic heart disease and other diseases of the circulatory system: Secondary | ICD-10-CM

## 2021-02-19 DIAGNOSIS — J440 Chronic obstructive pulmonary disease with acute lower respiratory infection: Secondary | ICD-10-CM | POA: Diagnosis present

## 2021-02-19 DIAGNOSIS — J9621 Acute and chronic respiratory failure with hypoxia: Secondary | ICD-10-CM | POA: Diagnosis present

## 2021-02-19 DIAGNOSIS — K219 Gastro-esophageal reflux disease without esophagitis: Secondary | ICD-10-CM | POA: Diagnosis present

## 2021-02-19 DIAGNOSIS — Z6825 Body mass index (BMI) 25.0-25.9, adult: Secondary | ICD-10-CM

## 2021-02-19 DIAGNOSIS — Z823 Family history of stroke: Secondary | ICD-10-CM

## 2021-02-19 DIAGNOSIS — Z20822 Contact with and (suspected) exposure to covid-19: Secondary | ICD-10-CM | POA: Diagnosis present

## 2021-02-19 DIAGNOSIS — Z79899 Other long term (current) drug therapy: Secondary | ICD-10-CM

## 2021-02-19 DIAGNOSIS — J209 Acute bronchitis, unspecified: Secondary | ICD-10-CM | POA: Diagnosis present

## 2021-02-19 DIAGNOSIS — H548 Legal blindness, as defined in USA: Secondary | ICD-10-CM | POA: Diagnosis present

## 2021-02-19 DIAGNOSIS — Z833 Family history of diabetes mellitus: Secondary | ICD-10-CM

## 2021-02-19 DIAGNOSIS — E440 Moderate protein-calorie malnutrition: Secondary | ICD-10-CM | POA: Insufficient documentation

## 2021-02-19 DIAGNOSIS — Z87891 Personal history of nicotine dependence: Secondary | ICD-10-CM

## 2021-02-19 DIAGNOSIS — Z9981 Dependence on supplemental oxygen: Secondary | ICD-10-CM

## 2021-02-19 DIAGNOSIS — Z825 Family history of asthma and other chronic lower respiratory diseases: Secondary | ICD-10-CM

## 2021-02-19 LAB — BASIC METABOLIC PANEL
Anion gap: 10 (ref 5–15)
BUN: 17 mg/dL (ref 8–23)
CO2: 28 mmol/L (ref 22–32)
Calcium: 8.8 mg/dL — ABNORMAL LOW (ref 8.9–10.3)
Chloride: 106 mmol/L (ref 98–111)
Creatinine, Ser: 0.86 mg/dL (ref 0.61–1.24)
GFR, Estimated: >60 mL/min (ref >60)
Glucose, Bld: 121 mg/dL — ABNORMAL HIGH (ref 70–99)
Potassium: 3.7 mmol/L (ref 3.5–5.1)
Sodium: 144 mmol/L (ref 135–145)

## 2021-02-19 LAB — CBC WITH DIFFERENTIAL/PLATELET
Abs Immature Granulocytes: 0.01 10*3/uL (ref 0.00–0.07)
Basophils Absolute: 0.1 10*3/uL (ref 0.0–0.1)
Basophils Relative: 1 %
Eosinophils Absolute: 0.4 10*3/uL (ref 0.0–0.5)
Eosinophils Relative: 5 %
HCT: 46.7 % (ref 39.0–52.0)
Hemoglobin: 15.5 g/dL (ref 13.0–17.0)
Immature Granulocytes: 0 %
Lymphocytes Relative: 40 %
Lymphs Abs: 3.2 10*3/uL (ref 0.7–4.0)
MCH: 29.6 pg (ref 26.0–34.0)
MCHC: 33.2 g/dL (ref 30.0–36.0)
MCV: 89.3 fL (ref 80.0–100.0)
Monocytes Absolute: 0.5 10*3/uL (ref 0.1–1.0)
Monocytes Relative: 7 %
Neutro Abs: 3.8 10*3/uL (ref 1.7–7.7)
Neutrophils Relative %: 47 %
Platelets: 275 10*3/uL (ref 150–400)
RBC: 5.23 MIL/uL (ref 4.22–5.81)
RDW: 13.2 % (ref 11.5–15.5)
WBC: 8.1 10*3/uL (ref 4.0–10.5)
nRBC: 0 % (ref 0.0–0.2)

## 2021-02-19 LAB — RESP PANEL BY RT-PCR (FLU A&B, COVID) ARPGX2
Influenza A by PCR: NEGATIVE
Influenza B by PCR: NEGATIVE
SARS Coronavirus 2 by RT PCR: NEGATIVE

## 2021-02-19 MED ORDER — ENSURE ENLIVE PO LIQD
237.0000 mL | Freq: Three times a day (TID) | ORAL | Status: DC
  Administered 2021-02-19 – 2021-02-22 (×9): 237 mL via ORAL

## 2021-02-19 MED ORDER — SODIUM CHLORIDE 0.9 % IV SOLN
500.0000 mg | Freq: Once | INTRAVENOUS | Status: AC
  Administered 2021-02-19: 500 mg via INTRAVENOUS
  Filled 2021-02-19: qty 500

## 2021-02-19 MED ORDER — IPRATROPIUM-ALBUTEROL 0.5-2.5 (3) MG/3ML IN SOLN
3.0000 mL | Freq: Four times a day (QID) | RESPIRATORY_TRACT | Status: DC
  Administered 2021-02-19 – 2021-02-20 (×5): 3 mL via RESPIRATORY_TRACT
  Filled 2021-02-19 (×5): qty 3

## 2021-02-19 MED ORDER — SODIUM CHLORIDE 0.9 % IV SOLN
1.0000 g | INTRAVENOUS | Status: DC
  Administered 2021-02-19 – 2021-02-20 (×2): 1 g via INTRAVENOUS
  Filled 2021-02-19 (×2): qty 10

## 2021-02-19 MED ORDER — MONTELUKAST SODIUM 10 MG PO TABS
10.0000 mg | ORAL_TABLET | Freq: Every day | ORAL | Status: DC
  Administered 2021-02-19 – 2021-02-22 (×4): 10 mg via ORAL
  Filled 2021-02-19 (×5): qty 1

## 2021-02-19 MED ORDER — GUAIFENESIN-DM 100-10 MG/5ML PO SYRP
5.0000 mL | ORAL_SOLUTION | ORAL | Status: DC | PRN
  Administered 2021-02-19 – 2021-02-22 (×8): 5 mL via ORAL
  Filled 2021-02-19 (×8): qty 5

## 2021-02-19 MED ORDER — IPRATROPIUM-ALBUTEROL 0.5-2.5 (3) MG/3ML IN SOLN
3.0000 mL | Freq: Once | RESPIRATORY_TRACT | Status: AC
  Administered 2021-02-19: 3 mL via RESPIRATORY_TRACT
  Filled 2021-02-19: qty 3

## 2021-02-19 MED ORDER — ENOXAPARIN SODIUM 40 MG/0.4ML ~~LOC~~ SOLN
40.0000 mg | SUBCUTANEOUS | Status: DC
  Administered 2021-02-19 – 2021-02-21 (×3): 40 mg via SUBCUTANEOUS
  Filled 2021-02-19 (×3): qty 0.4

## 2021-02-19 MED ORDER — PANTOPRAZOLE SODIUM 20 MG PO TBEC
20.0000 mg | DELAYED_RELEASE_TABLET | Freq: Every day | ORAL | Status: DC
  Administered 2021-02-20 – 2021-02-22 (×3): 20 mg via ORAL
  Filled 2021-02-19 (×4): qty 1

## 2021-02-19 MED ORDER — PREDNISONE 20 MG PO TABS
40.0000 mg | ORAL_TABLET | Freq: Every day | ORAL | Status: DC
  Administered 2021-02-20 – 2021-02-21 (×2): 40 mg via ORAL
  Filled 2021-02-19 (×2): qty 2

## 2021-02-19 MED ORDER — GUAIFENESIN ER 600 MG PO TB12
600.0000 mg | ORAL_TABLET | Freq: Two times a day (BID) | ORAL | Status: DC
  Administered 2021-02-19 – 2021-02-22 (×6): 600 mg via ORAL
  Filled 2021-02-19 (×6): qty 1

## 2021-02-19 MED ORDER — METHYLPREDNISOLONE SODIUM SUCC 40 MG IJ SOLR
40.0000 mg | Freq: Two times a day (BID) | INTRAMUSCULAR | Status: AC
  Administered 2021-02-19 (×2): 40 mg via INTRAVENOUS
  Filled 2021-02-19 (×2): qty 1

## 2021-02-19 MED ORDER — SODIUM CHLORIDE 0.45 % IV SOLN: INTRAVENOUS | Status: DC

## 2021-02-19 MED ORDER — ALBUTEROL SULFATE (2.5 MG/3ML) 0.083% IN NEBU
2.5000 mg | INHALATION_SOLUTION | RESPIRATORY_TRACT | Status: DC | PRN
  Administered 2021-02-20: 2.5 mg via RESPIRATORY_TRACT
  Filled 2021-02-19: qty 3

## 2021-02-19 MED ORDER — ADULT MULTIVITAMIN W/MINERALS CH
1.0000 | ORAL_TABLET | Freq: Every day | ORAL | Status: DC
  Administered 2021-02-20 – 2021-02-22 (×3): 1 via ORAL
  Filled 2021-02-19 (×3): qty 1

## 2021-02-19 MED ORDER — MOMETASONE FURO-FORMOTEROL FUM 200-5 MCG/ACT IN AERO
2.0000 | INHALATION_SPRAY | Freq: Two times a day (BID) | RESPIRATORY_TRACT | Status: DC
  Administered 2021-02-19 – 2021-02-22 (×6): 2 via RESPIRATORY_TRACT
  Filled 2021-02-19: qty 8.8

## 2021-02-19 NOTE — Progress Notes (Signed)
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  GOAL:   Patient will meet greater than or equal to 90% of their needs  MONITOR:   PO intake,Supplement acceptance,Labs,Weight trends,Skin,I & O's  REASON FOR ASSESSMENT:   Consult COPD Protocol  ASSESSMENT:   65 y/o male with h/o COPD, GERD, depression, etoh abuse, HTN and BPH who is admitted with COPD exacerbation   Met with pt in room today. Pt with SOB at time of RD visit so questions kept to a minimum. Pt reports good appetite and oral intake at baseline. Pt also reports that he drinks vanilla supplements at home and that his weight is stable. RD will add supplements and MVI to help pt meet his estimated needs. RD will also liberalize the heart healthy portion of pt's diet as this is restrictive of protein. Per chart, pt appears weight stable at baseline. Pt is likely at refeed risk secondary to etoh use.   Medications reviewed and include: lovenox, solu-medrol, MVI, protonix, NaCl @75ml /hr, ceftriaxone  Labs reviewed: K 3.7 wnl AIC 6.4(H)- 07/2020  NUTRITION - FOCUSED PHYSICAL EXAM:  Flowsheet Row Most Recent Value  Orbital Region Mild depletion  Upper Arm Region Moderate depletion  Thoracic and Lumbar Region Moderate depletion  Buccal Region No depletion  Temple Region Mild depletion  Clavicle Bone Region Moderate depletion  Clavicle and Acromion Bone Region Moderate depletion  Scapular Bone Region Mild depletion  Dorsal  Hand Mild depletion  Patellar Region Moderate depletion  Anterior Thigh Region Moderate depletion  Posterior Calf Region Moderate depletion  Edema (RD Assessment) None  Hair Reviewed  Eyes Reviewed  Mouth Reviewed  Skin Reviewed  Nails Reviewed     Diet Order:   Diet Order            Diet 2 gram sodium Room service appropriate? Yes; Fluid consistency: Thin  Diet effective now                EDUCATION NEEDS:   Education needs have been addressed  Skin:  Skin Assessment: Reviewed RN Assessment  Last BM:  pta  Height:   Ht Readings from Last 1 Encounters:  02/19/21 5' 9"  (1.753 m)    Weight:   Wt Readings from Last 1 Encounters:  02/19/21 77 kg    Ideal Body Weight:  72.7 kg  BMI:  Body mass index is 25.07 kg/m.  Estimated Nutritional Needs:   Kcal:  2000-2300kcal/day  Protein:  100-115g/day  Fluid:  2.1-2.4L/day  Koleen Distance MS, RD, LDN Please refer to [facility] for RD and/or RD on-call/weekend/after hours pager

## 2021-02-19 NOTE — Evaluation (Addendum)
Occupational Therapy Evaluation Patient Details Name: Bryan Kelley MRN: [id number] DOB: January 08, 1956 Today's Date: 02/19/2021    History of Present Illness 65 y.o. male with medical history significant for COPD with chronic respiratory failure on 2 L of oxygen continuous, history of asthma, history of depression and GERD who presents to the ER for evaluation of a 3-day history of worsening shortness of breath from his baseline associated with wheezing and a cough productive of brown phlegm.  He also has associated chest tightness.   Clinical Impression   Pt seen for OT evaluation this date. Prior to admission, pt was using 2L of O2 and was independent with ADLs and functional mobility of household distances without AD/DME. Pt was living with his sister in a 1-story mobile home, with sister assisting with IADLs. Pt reports limited community mobility. Upon arrival to room, pt seated upright in bed on 4L of O2. Pt currently presents with decreased strength and activity tolerance, and requires MIN GUARD for LB dressing and SUPERVISION for functional mobility (~10 ft) without AD. Pt deferred further mobility d/t increased work of breathing following functional mobility, however SpO2 95-98% throughout. Pt would benefit from additional skilled OT services to maximize return to PLOF and to facilitate implementation of energy conservation strategies into daily routine. Upon discharge, recommend intermittent supervision/assistance.    Follow Up Recommendations  No OT follow up;Supervision - Intermittent    Equipment Recommendations  None recommended by OT       Precautions / Restrictions Restrictions Weight Bearing Restrictions: No      Mobility Bed Mobility Overal bed mobility: Modified Independent                  Transfers Overall transfer level: Needs assistance   Transfers: Sit to/from Stand Sit to Stand: Supervision              Balance Overall balance assessment: Needs  assistance Sitting-balance support: No upper extremity supported;Feet supported Sitting balance-Leahy Scale: Good Sitting balance - Comments: good sitting balance at EOB   Standing balance support: No upper extremity supported;During functional activity Standing balance-Leahy Scale: Good Standing balance comment: Good standing balance while walking ~10 feet                           ADL either performed or assessed with clinical judgement   ADL Overall ADL's : Needs assistance/impaired                     Lower Body Dressing: Min guard;Sit to/from stand Lower Body Dressing Details (indicate cue type and reason): to don shoes             Functional mobility during ADLs: Supervision/safety (to walk ~10 feet w/out AD)       Vision Baseline Vision/History: 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) Wears Glasses: At all times Patient Visual Report: No change from baseline              Pertinent Vitals/Pain Pain Assessment: No/denies pain     Hand Dominance Right   Extremity/Trunk Assessment Upper Extremity Assessment Upper Extremity Assessment: Overall WFL for tasks assessed   Lower Extremity Assessment Lower Extremity Assessment: Overall WFL for tasks assessed       Communication Communication Communication: No difficulties   Cognition Arousal/Alertness: Awake/alert Behavior During Therapy: WFL for tasks assessed/performed Overall Cognitive Status: Within Functional Limits for tasks assessed  General Comments  Pt on 4L of O2, with SpO2 95-98% throughout            Home Living Family/patient expects to be discharged to:: Private residence Living Arrangements: Other relatives (sister) Available Help at Discharge: Family;Available 24 hours/day Type of Home: Mobile home Home Access: Stairs to enter Entrance Stairs-Number of Steps: 3 Entrance Stairs-Rails: Right;Left Home Layout: One  level     Bathroom Shower/Tub: Producer, television/film/video: Standard     Home Equipment: Other (comment)   Additional Comments: oxygen (2L) and pulse ox      Prior Functioning/Environment Level of Independence: Independent        Comments: Pt reports being independent with ADLs and functional mobility of household distances w/out AD. Pt's sister assists with IADLs. Limited community mobility.        OT Problem List: Decreased strength;Decreased activity tolerance;Impaired balance (sitting and/or standing)      OT Treatment/Interventions: Self-care/ADL training;Therapeutic exercise;Energy conservation;DME and/or AE instruction;Therapeutic activities;Patient/family education;Balance training    OT Goals(Current goals can be found in the care plan section) Acute Rehab OT Goals Patient Stated Goal: to improve breathing OT Goal Formulation: With patient Time For Goal Achievement: 03/05/21 Potential to Achieve Goals: Good  OT Frequency: Min 1X/week    AM-PAC OT "6 Clicks" Daily Activity     Outcome Measure Help from another person eating meals?: None Help from another person taking care of personal grooming?: A Little Help from another person toileting, which includes using toliet, bedpan, or urinal?: A Little Help from another person bathing (including washing, rinsing, drying)?: A Little Help from another person to put on and taking off regular upper body clothing?: None Help from another person to put on and taking off regular lower body clothing?: A Little 6 Click Score: 20   End of Session Equipment Utilized During Treatment: Oxygen Nurse Communication: Mobility status  Activity Tolerance: Patient tolerated treatment well Patient left: in chair;with call bell/phone within reach  OT Visit Diagnosis: Muscle weakness (generalized) (M62.81)                Time: 5400-8676 OT Time Calculation (min): 14 min Charges:  OT General Charges $OT Visit: 1 Visit OT  Evaluation $OT Eval Moderate Complexity: 1 Mod  Matthew Folks, OTR/L ASCOM [phone]

## 2021-02-19 NOTE — ED Notes (Signed)
Informed RN bed assigned [phone]

## 2021-02-19 NOTE — ED Triage Notes (Signed)
Started 3 days sob

## 2021-02-19 NOTE — ED Provider Notes (Signed)
[facility] Emergency Department Provider Note   ____________________________________________   I have reviewed the triage vital signs and the nursing notes.   HISTORY  Chief Complaint Shortness of Breath (Sob x2 days )   History limited by: Not Limited   HPI Bryan Kelley is a 65 y.o. male who presents to the emergency department today because of concern for shortness of breath. Patient with history of COPD and states he is on 2L O2 at home. For the past few days he has been having worsening shortness of breath. He has been trying his home breathing treatments without any significant improvement. The patient has had associated chest pain. Denies any fevers. Denies any known sick contacts and has had his covid vaccines. The patient was given 125 solumedrol, magnesium and a breathing treatment by EMS.    Records reviewed. Per medical record review patient has a history of COPD.   Past Medical History:  Diagnosis Date  . Asthma   . COPD (chronic obstructive pulmonary disease) (HCC)   . Depression   . GERD (gastroesophageal reflux disease)   . Vision loss, left eye     Patient Active Problem List   Diagnosis Date Noted  . Acute respiratory failure (HCC) 10/06/2020  . Tobacco abuse 07/25/2020  . HTN (hypertension) 07/25/2020  . Acute on chronic respiratory failure with hypercapnia (HCC) 12/06/2019  . Nausea vomiting and diarrhea 12/06/2019  . Acute respiratory disease due to COVID-19 virus 12/06/2019  . COPD (chronic obstructive pulmonary disease) (HCC) 10/30/2019  . Hyperglycemia   . COPD with acute exacerbation (HCC) 10/29/2019  . COPD exacerbation (HCC) 10/29/2019  . Epigastric abdominal pain 08/12/2012  . Alcohol abuse 03/14/2012  . Elevated BP 12/07/2011  . ED (erectile dysfunction) 07/28/2011  . NICOTINE ADDICTION 10/29/2009  . ACUTE BRONCHITIS 05/07/2009  . 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 ANKLE PAIN, RIGHT 10/04/2008  .  FATIGUE 10/04/2008  . ANXIETY 02/17/2008  . ASTHMA 02/17/2008  . GERD 02/17/2008  . BPH (benign prostatic hyperplasia) 02/17/2008    Past Surgical History:  Procedure Laterality Date  . EYE SURGERY  2009, about 1610,9604   steel in left eye on the job, legally blind     Prior to Admission medications   Medication Sig Start Date End Date Taking? Authorizing Provider  albuterol (PROVENTIL) (2.5 MG/3ML) 0.083% nebulizer solution Take 2.5 mg by nebulization every 4 (four) hours as needed for wheezing. 06/06/20   [provider]  albuterol (VENTOLIN HFA) 108 (90 Base) MCG/ACT inhaler Inhale 2 puffs into the lungs every 6 (six) hours as needed for wheezing or shortness of breath. Patient not taking: Reported on 09/09/2020 11/30/19   Nita Sickle, MD  DULERA 200-5 MCG/ACT AERO Inhale 2 puffs into the lungs 2 (two) times daily. 09/12/20   Almon Hercules, MD  guaiFENesin (MUCINEX) 600 MG 12 hr tablet Take 1 tablet (600 mg total) by mouth 2 (two) times daily. 10/07/20   Arnetha Courser, MD  montelukast (SINGULAIR) 10 MG tablet Take 10 mg by mouth daily. 09/25/20   [provider]  nicotine (NICODERM CQ - DOSED IN MG/24 HOURS) 21 mg/24hr patch Place 1 patch (21 mg total) onto the skin daily. Patient not taking: Reported on 09/09/2020 07/29/20   Esaw Grandchild A, DO  pantoprazole (PROTONIX) 20 MG tablet Take 1 tablet (20 mg total) by mouth daily. 11/24/20 12/24/20  Lynn Ito, MD  Tiotropium Bromide Monohydrate (SPIRIVA RESPIMAT) 2.5 MCG/ACT AERS Inhale 2 puffs into the lungs daily.  07/30/20   Glenford Bayley, NP  VIAGRA 50 MG tablet Take 50 mg by mouth as needed for erectile dysfunction. Patient not taking: Reported on 09/09/2020 03/03/20   [provider]    Allergies Patient has no known allergies.  Family History  Problem Relation Age of Onset  . Diabetes Mother   . Hypertension Mother   . Stroke Mother   . Asthma Father   . Diabetes Father   . Diabetes Sister    . Hypertension Sister   . Diabetes Brother   . Hypertension Brother     Social History Social History   Tobacco Use  . Smoking status: Former Smoker    Packs/day: 0.50    Years: 40.00    Pack years: 20.00    Types: Cigarettes    Quit date: 2020    Years since quitting: 2.2  . Smokeless tobacco: Never Used  Vaping Use  . Vaping Use: Never used  Substance Use Topics  . Alcohol use: Yes  . Drug use: No    Review of Systems Constitutional: No fever/chills Eyes: No visual changes. ENT: No sore throat. Cardiovascular: Positive for chest pain. Respiratory: Positive for shortness of breath. Gastrointestinal: No abdominal pain.  No nausea, no vomiting.  No diarrhea.   Genitourinary: Negative for dysuria. Musculoskeletal: Negative for back pain. Skin: Negative for rash. Neurological: Negative for headaches, focal weakness or numbness.  ____________________________________________   PHYSICAL EXAM:  VITAL SIGNS: ED Triage Vitals  Enc Vitals Group     BP 02/19/21 0756 (!) 145/97     Pulse Rate 02/19/21 0756 95     Resp 02/19/21 0756 20     Temp 02/19/21 0756 98.1 F (36.7 C)     Temp Source 02/19/21 0756 Oral     SpO2 02/19/21 0756 99 %     Weight --      Height --      Head Circumference --      Peak Flow --      Pain Score 02/19/21 0757 5   Constitutional: Alert and oriented.  Eyes: Conjunctivae are normal.  ENT      Head: Normocephalic and atraumatic.      Nose: No congestion/rhinnorhea.      Mouth/Throat: Mucous membranes are moist.      Neck: No stridor. Hematological/Lymphatic/Immunilogical: No cervical lymphadenopathy. Cardiovascular: Normal rate, regular rhythm.  No murmurs, rubs, or gallops.  Respiratory: Increased work of breathing. Diffuse expiratory wheezing and poor air movement.  Gastrointestinal: Soft and non tender. No rebound. No guarding.  Genitourinary: Deferred Musculoskeletal: Normal range of motion in all extremities. No lower extremity  edema. Neurologic:  Normal speech and language. No gross focal neurologic deficits are appreciated.  Skin:  Skin is warm, dry and intact. No rash noted. Psychiatric: Mood and affect are normal. Speech and behavior are normal. Patient exhibits appropriate insight and judgment.  ____________________________________________    LABS (pertinent positives/negatives)  CBC wbc 8.1, hgb 15.5, plt 275 BMP wnl except glu 121, ca 8.8 COVID negative ____________________________________________   EKG  I, Phineas Semen, attending physician, personally viewed and interpreted this EKG  EKG Time: 0818 Rate: 88 Rhythm: sinus rhythm Axis: normal Intervals: qtc 452 QRS: narrow ST changes: no st elevation Impression: biatrial enlargement   ____________________________________________    RADIOLOGY  CXR No acute abnormality  ____________________________________________   PROCEDURES  Procedures  ____________________________________________   INITIAL IMPRESSION / ASSESSMENT AND PLAN / ED COURSE  Pertinent labs & imaging results that were  available during my care of the patient were reviewed by me and considered in my medical decision making (see chart for details).   Patient presented to the emergency department today because of concern for shortness of breath. Patient with history of COPD. On exam patient with increased work of breathing, diffuse expiratory wheezing. CXR without concern for pneumonia/ptx. Blood work without elevated white count. At this time do think likely COPD exacerbation. Have low suspicion for PE. COVID negative. After treatments patient did appear improved however continued to have tight lung sides. Will plan on admission for further work up and management.   ____________________________________________   FINAL CLINICAL IMPRESSION(S) / ED DIAGNOSES  Final diagnoses:  COPD exacerbation (HCC)  Shortness of breath     Note: This dictation was prepared  with Dragon dictation. Any transcriptional errors that result from this process are unintentional     Phineas Semen, MD 02/19/21 [phone]

## 2021-02-19 NOTE — H&P (Signed)
History and Physical    Bryan Kelley QZE:[id number] DOB: 04-02-1956 DOA: 02/19/2021  [name]   Patient coming from: Home  I have personally briefly reviewed patient's old medical records in [facility]  Chief Complaint: Shortness of breath x 3 days  HPI: Bryan Kelley is a 65 y.o. male with medical history significant for COPD with chronic respiratory failure on 2 L of oxygen continuous, history of asthma, history of depression and GERD who presents to the ER for evaluation of a 3-day history of worsening shortness of breath from his baseline associated with wheezing and a cough productive of brown phlegm.  He also has associated chest tightness. He denies having any sick contacts and denies having any fever or chills. He denies having any lower extremity swelling, no orthopnea, no PND, no palpitations, no diaphoresis, no headache, no dizziness, no lightheadedness, no abdominal pain, no changes in his bowel habits or any urinary symptoms. He has used his nebulizers at home without any improvement in his symptoms. EMS had administered Solu-Medrol 125 mg and IV magnesium as well as nebulizer treatment. Labs show sodium 144, potassium 3.7, chloride 106, bicarb 28, glucose 121, BUN 17, creatinine 0.86, calcium 8.8, white count 8.1, hemoglobin 15.5, hematocrit 46.7, MCV 89.3, RDW 13.3, platelet count 275 Respiratory viral panel is negative Chest x-ray reviewed by me shows no acute findings Twelve-lead EKG reviewed by me shows sinus rhythm with T wave inversions in the lateral leads.  ED Course: Patient is a 65 year old African-American male with history of COPD and chronic respiratory failure on 2 L of oxygen who presents to the ER for evaluation of worsening shortness of breath from his baseline associated with a cough productive of brownish phlegm, chest tightness and wheezing. He received IV Solu-Medrol and bronchodilator therapy without any improvement  in his symptoms. He will be referred to observation status for further evaluation.    Review of Systems: As per HPI otherwise all other systems reviewed and negative.    Past Medical History:  Diagnosis Date  . Asthma   . COPD (chronic obstructive pulmonary disease) (HCC)   . Depression   . GERD (gastroesophageal reflux disease)   . Vision loss, left eye     Past Surgical History:  Procedure Laterality Date  . EYE SURGERY  2009, about [id number]   steel in left eye on the job, legally blind      reports that he quit smoking about 2 years ago. His smoking use included cigarettes. He has a 20.00 pack-year smoking history. He has never used smokeless tobacco. He reports current alcohol use. He reports that he does not use drugs.  No Known Allergies  Family History  Problem Relation Age of Onset  . Diabetes Mother   . Hypertension Mother   . Stroke Mother   . Asthma Father   . Diabetes Father   . Diabetes Sister   . Hypertension Sister   . Diabetes Brother   . Hypertension Brother       Prior to Admission medications   Medication Sig Start Date End Date Taking? Authorizing Provider  albuterol (PROVENTIL) (2.5 MG/3ML) 0.083% nebulizer solution Take 2.5 mg by nebulization every 4 (four) hours as needed for wheezing. 06/06/20   [provider]  albuterol (VENTOLIN HFA) 108 (90 Base) MCG/ACT inhaler Inhale 2 puffs into the lungs every 6 (six) hours as needed for wheezing or shortness of breath. Patient not taking: Reported on 09/09/2020 11/30/19  Don Perking, [place], MD  DULERA 200-5 MCG/ACT AERO Inhale 2 puffs into the lungs 2 (two) times daily. 09/12/20   Almon Hercules, MD  guaiFENesin (MUCINEX) 600 MG 12 hr tablet Take 1 tablet (600 mg total) by mouth 2 (two) times daily. 10/07/20   Arnetha Courser, MD  montelukast (SINGULAIR) 10 MG tablet Take 10 mg by mouth daily. 09/25/20   [provider]  nicotine (NICODERM CQ - DOSED IN MG/24 HOURS) 21 mg/24hr patch Place 1  patch (21 mg total) onto the skin daily. Patient not taking: Reported on 09/09/2020 07/29/20   Esaw Grandchild A, DO  pantoprazole (PROTONIX) 20 MG tablet Take 1 tablet (20 mg total) by mouth daily. 11/24/20 12/24/20  Lynn Ito, MD  Tiotropium Bromide Monohydrate (SPIRIVA RESPIMAT) 2.5 MCG/ACT AERS Inhale 2 puffs into the lungs daily. 07/30/20   Glenford Bayley, NP  VIAGRA 50 MG tablet Take 50 mg by mouth as needed for erectile dysfunction. Patient not taking: Reported on 09/09/2020 03/03/20   [provider]    Physical Exam: Vitals:   02/19/21 0756 02/19/21 0947  BP: (!) 145/97 (!) 147/87  Pulse: 95 81  Resp: 20 18  Temp: 98.1 F (36.7 C)   TempSrc: Oral   SpO2: 99% 98%     Vitals:   02/19/21 0756 02/19/21 0947  BP: (!) 145/97 (!) 147/87  Pulse: 95 81  Resp: 20 18  Temp: 98.1 F (36.7 C)   TempSrc: Oral   SpO2: 99% 98%      Constitutional: Alert and oriented x 3 .  Has conversational dyspnea HEENT:      Head: Normocephalic and atraumatic.         Eyes: PERLA, EOMI, Conjunctivae are normal. Sclera is non-icteric.       Mouth/Throat: Mucous membranes are moist.       Neck: Supple with no signs of meningismus. Cardiovascular: Regular rate and rhythm. No murmurs, gallops, or rubs. 2+ symmetrical distal pulses are present . No JVD. No LE edema Respiratory: Respiratory effort normal Bilateral air entry.  Diffuse wheezes in all lung fields, no crackles, or rhonchi.  Gastrointestinal: Soft, non tender, and non distended with positive bowel sounds.  Genitourinary: No CVA tenderness. Musculoskeletal: Nontender with normal range of motion in all extremities. No cyanosis, or erythema of extremities. Neurologic:  Face is symmetric. Moving all extremities. No gross focal neurologic deficits  Skin: Skin is warm, dry.  No rash or ulcers Psychiatric: Mood and affect are normal   Labs on Admission: I have personally reviewed following labs and imaging studies  CBC: Recent  Labs  Lab 02/19/21 0759  WBC 8.1  NEUTROABS 3.8  HGB 15.5  HCT 46.7  MCV 89.3  PLT 275   Basic Metabolic Panel: Recent Labs  Lab 02/19/21 0759  NA 144  K 3.7  CL 106  CO2 28  GLUCOSE 121*  BUN 17  CREATININE 0.86  CALCIUM 8.8*   GFR: CrCl cannot be calculated (Unknown ideal weight.). Liver Function Tests: No results for input(s): AST, ALT, ALKPHOS, BILITOT, PROT, ALBUMIN in the last 168 hours. No results for input(s): LIPASE, AMYLASE in the last 168 hours. No results for input(s): AMMONIA in the last 168 hours. Coagulation Profile: No results for input(s): INR, PROTIME in the last 168 hours. Cardiac Enzymes: No results for input(s): CKTOTAL, CKMB, CKMBINDEX, TROPONINI in the last 168 hours. BNP (last 3 results) No results for input(s): PROBNP in the last 8760 hours. HbA1C: No results for input(s): HGBA1C  in the last 72 hours. CBG: No results for input(s): GLUCAP in the last 168 hours. Lipid Profile: No results for input(s): CHOL, HDL, LDLCALC, TRIG, CHOLHDL, LDLDIRECT in the last 72 hours. Thyroid Function Tests: No results for input(s): TSH, T4TOTAL, FREET4, T3FREE, THYROIDAB in the last 72 hours. Anemia Panel: No results for input(s): VITAMINB12, FOLATE, FERRITIN, TIBC, IRON, RETICCTPCT in the last 72 hours. Urine analysis: No results found for: COLORURINE, APPEARANCEUR, LABSPEC, PHURINE, GLUCOSEU, HGBUR, BILIRUBINUR, KETONESUR, PROTEINUR, UROBILINOGEN, NITRITE, LEUKOCYTESUR  Radiological Exams on Admission: DG Chest Port 1 View  Result Date: 02/19/2021 CLINICAL DATA:  Shortness of breath EXAM: PORTABLE CHEST 1 VIEW COMPARISON:  11/22/2020 FINDINGS: The heart size and mediastinal contours are within normal limits. Both lungs are clear. No pleural effusion or pneumothorax. The visualized skeletal structures are unremarkable. IMPRESSION: No acute process in the chest. Electronically Signed   By: Guadlupe Spanish M.D.   On: 02/19/2021 08:13      Assessment/Plan Principal Problem:   COPD with acute exacerbation (HCC) Active Problems:   Acute bronchitis   GERD (gastroesophageal reflux disease)   Chronic respiratory failure (HCC)     COPD with acute exacerbation Patient has a history of COPD with chronic respiratory failure and is on 2 L of oxygen continuous He presents for evaluation of worsening shortness of breath associated with chest tightness, wheezing and a cough productive of brown phlegm We will place patient on scheduled and as needed bronchodilator therapy Place patient on systemic steroids Continue inhaled steroids and Singulair We will add Rocephin 1 g IV daily for acute bronchitis Maintain pulse oximetry greater than 92%   GERD Continue Protonix DVT prophylaxis: Lovenox Code Status: full code Family Communication: Greater than 50% of time was spent discussing patient's condition and plan of care with him at the bedside.  All questions and concerns have been addressed.  He verbalizes understanding and agrees with the plan. Disposition Plan: Back to previous home environment Consults called: none Status: Observation    Lashuna Tamashiro MD [facility]     02/19/2021, 10:15 AM

## 2021-02-19 NOTE — Evaluation (Signed)
Physical Therapy Evaluation Patient Details Name: Bryan Kelley MRN: [id number] DOB: 31-May-1956 Today's Date: 02/19/2021   History of Present Illness  65 y.o. male with medical history significant for COPD with chronic respiratory failure on 2 L of oxygen continuous, history of asthma, history of depression and GERD who presents to the ER for evaluation of a 3-day history of worsening shortness of breath from his baseline associated with wheezing and a cough productive of brown phlegm.  He also has associated chest tightness.  Clinical Impression  Pt is a pleasant 65 year old male who was admitted for COPD exacerbation. Pt performs bed mobility/transfers with independence and ambulation with supervision and no AD. Pt demonstrates all bed mobility/transfers/ambulation at baseline level. Pt does not require any further PT needs at this time. Due to decreased endurance and low energy conservation, recommend pulm rehab follow up. When discussed with patient, he admits transportation is a barrier as he doesn't drive. Secure message sent to [facility] for resources of public transport. O2 monitored on baseline level (2L) with sats WNL with exertion. Pt will be dc in house and does not require follow up. RN aware. Will dc current orders.    Follow Up Recommendations  (pulm rehab)    Equipment Recommendations  None recommended by PT    Recommendations for Other Services       Precautions / Restrictions Precautions Precautions: None Restrictions Weight Bearing Restrictions: No      Mobility  Bed Mobility Overal bed mobility: Independent             General bed mobility comments: safe technique with ease of mobility noted    Transfers Overall transfer level: Independent Equipment used: None Transfers: Sit to/from Stand Sit to Stand: Independent         General transfer comment: safe technique with ease of mobility and effort. Upright posture noted.  Ambulation/Gait Ambulation/Gait  assistance: Supervision Gait Distance (Feet): 80 Feet Assistive device: None Gait Pattern/deviations: WFL(Within Functional Limits)     General Gait Details: ambulated a few laps in room. All mobility performed on 2L (chronic baseline) of O2 with sats at 98%. Fatigues with endurance. No AD used  Information systems manager Rank[employer] (Stroke Patients Only)       Balance Overall balance assessment: No apparent balance deficits (not formally assessed)                                           Pertinent Vitals/Pain Pain Assessment: No/denies pain    Home Living Family/patient expects to be discharged to:: Private residence Living Arrangements: Other relatives (sister) Available Help at Discharge: Family;Available 24 hours/day Type of Home: Mobile home Home Access: Stairs to enter Entrance Stairs-Rails: Doctor, general practice of Steps: 3 Home Layout: One level   Additional Comments: oxygen (2L) and pulse ox    Prior Function Level of Independence: Independent         Comments: Pt reports being independent with ADLs and functional mobility of household distances w/out AD. Pt's sister assists with IADLs. Limited community mobility.     Hand Dominance   Dominant Hand: Right    Extremity/Trunk Assessment   Upper Extremity Assessment Upper Extremity Assessment: Overall WFL for tasks assessed    Lower Extremity Assessment Lower Extremity Assessment: Overall WFL for tasks assessed  Communication   Communication: No difficulties  Cognition Arousal/Alertness: Awake/alert Behavior During Therapy: WFL for tasks assessed/performed Overall Cognitive Status: Within Functional Limits for tasks assessed                                        General Comments      Exercises Other Exercises Other Exercises: observed pt walking into bathroom at end of session. Able to navigate IV pole  independently. No unsteadiness.   Assessment/Plan    PT Assessment Patent does not need any further PT services  PT Problem List         PT Treatment Interventions      PT Goals (Current goals can be found in the Care Plan section)  Acute Rehab PT Goals Patient Stated Goal: to improve breathing PT Goal Formulation: All assessment and education complete, DC therapy Time For Goal Achievement: 02/19/21 Potential to Achieve Goals: Good    Frequency     Barriers to discharge        Co-evaluation               AM-PAC PT "6 Clicks" Mobility  Outcome Measure Help needed turning from your back to your side while in a flat bed without using bedrails?: None Help needed moving from lying on your back to sitting on the side of a flat bed without using bedrails?: None Help needed moving to and from a bed to a chair (including a wheelchair)?: None Help needed standing up from a chair using your arms (e.g., wheelchair or bedside chair)?: None Help needed to walk in hospital room?: None Help needed climbing 3-5 steps with a railing? : A Little 6 Click Score: 23    End of Session Equipment Utilized During Treatment: Oxygen Activity Tolerance: Patient tolerated treatment well Patient left:  (left in bathroom) Nurse Communication: Mobility status PT Visit Diagnosis: Difficulty in walking, not elsewhere classified (R26.2)    Time: 1359-1411 PT Time Calculation (min) (ACUTE ONLY): 12 min   Charges:   PT Evaluation $PT Eval Low Complexity: 1 Low          Bryan Kelley, PT, DPT [id number]   Bryan Kelley 02/19/2021, 5:10 PM

## 2021-02-20 DIAGNOSIS — K219 Gastro-esophageal reflux disease without esophagitis: Secondary | ICD-10-CM | POA: Diagnosis not present

## 2021-02-20 DIAGNOSIS — J209 Acute bronchitis, unspecified: Secondary | ICD-10-CM

## 2021-02-20 DIAGNOSIS — J441 Chronic obstructive pulmonary disease with (acute) exacerbation: Secondary | ICD-10-CM | POA: Diagnosis not present

## 2021-02-20 DIAGNOSIS — E440 Moderate protein-calorie malnutrition: Secondary | ICD-10-CM | POA: Diagnosis not present

## 2021-02-20 LAB — BASIC METABOLIC PANEL
Anion gap: 8 (ref 5–15)
BUN: 18 mg/dL (ref 8–23)
CO2: 27 mmol/L (ref 22–32)
Calcium: 9.6 mg/dL (ref 8.9–10.3)
Chloride: 101 mmol/L (ref 98–111)
Creatinine, Ser: 0.87 mg/dL (ref 0.61–1.24)
GFR, Estimated: >60 mL/min (ref >60)
Glucose, Bld: 224 mg/dL — ABNORMAL HIGH (ref 70–99)
Potassium: 4.5 mmol/L (ref 3.5–5.1)
Sodium: 136 mmol/L (ref 135–145)

## 2021-02-20 LAB — CBC
HCT: 40.8 % (ref 39.0–52.0)
Hemoglobin: 13.5 g/dL (ref 13.0–17.0)
MCH: 29.6 pg (ref 26.0–34.0)
MCHC: 33.1 g/dL (ref 30.0–36.0)
MCV: 89.5 fL (ref 80.0–100.0)
Platelets: 239 10*3/uL (ref 150–400)
RBC: 4.56 MIL/uL (ref 4.22–5.81)
RDW: 13.1 % (ref 11.5–15.5)
WBC: 15.3 10*3/uL — ABNORMAL HIGH (ref 4.0–10.5)
nRBC: 0 % (ref 0.0–0.2)

## 2021-02-20 LAB — HIV ANTIBODY (ROUTINE TESTING W REFLEX): HIV Screen 4th Generation wRfx: NONREACTIVE

## 2021-02-20 MED ORDER — AZITHROMYCIN 250 MG PO TABS
500.0000 mg | ORAL_TABLET | Freq: Every day | ORAL | Status: AC
  Administered 2021-02-20 – 2021-02-21 (×2): 500 mg via ORAL
  Filled 2021-02-20 (×2): qty 2

## 2021-02-20 MED ORDER — IPRATROPIUM-ALBUTEROL 0.5-2.5 (3) MG/3ML IN SOLN
3.0000 mL | RESPIRATORY_TRACT | Status: DC
  Administered 2021-02-20 – 2021-02-22 (×9): 3 mL via RESPIRATORY_TRACT
  Filled 2021-02-20 (×8): qty 3

## 2021-02-20 NOTE — Progress Notes (Addendum)
1        [place] at [facility]   PATIENT NAME: Bryan Kelley    MR#:  [id number]  DATE OF BIRTH:  12-27-1955  SUBJECTIVE:  CHIEF COMPLAINT:   Chief Complaint  Patient presents with  . Shortness of Breath    Sob x2 days   Feels very short of breath.  Having tenderness of the rib cage from constant coughing and struggling to breathe REVIEW OF SYSTEMS:  Review of Systems  Constitutional: Positive for malaise/fatigue. Negative for diaphoresis, fever and weight loss.  HENT: Negative for ear discharge, ear pain, hearing loss, nosebleeds, sore throat and tinnitus.   Eyes: Negative for blurred vision and pain.  Respiratory: Positive for cough, shortness of breath and wheezing. Negative for hemoptysis.   Cardiovascular: Negative for chest pain, palpitations, orthopnea and leg swelling.  Gastrointestinal: Negative for abdominal pain, blood in stool, constipation, diarrhea, heartburn, nausea and vomiting.  Genitourinary: Negative for dysuria, frequency and urgency.  Musculoskeletal: Negative for back pain and myalgias.  Skin: Negative for itching and rash.  Neurological: Negative for dizziness, tingling, tremors, focal weakness, seizures, weakness and headaches.  Psychiatric/Behavioral: Negative for depression. The patient is not nervous/anxious.    DRUG ALLERGIES:  No Known Allergies VITALS:  Blood pressure (!) 155/83, pulse 73, temperature 98 F (36.7 C), resp. rate 16, height 5\' 9"  (1.753 m), weight 77 kg, SpO2 97 %. PHYSICAL EXAMINATION:  Physical Exam  65 year old male lying in the bed in acute respiratory distress Lungs decreased breath sound the bases wheezing throughout both lungs using accessory muscles of respiration.  No rhonchi or rales Cardiovascular S1-S2 normal no murmurs or gallop Abdomen soft nontender nondistended bowel sounds present Neuro alert and oriented Extremities dry skin and onychomycosis in both lower extremity nails LABORATORY PANEL:   Male CBC Recent Labs  Lab 02/20/21 0440  WBC 15.3*  HGB 13.5  HCT 40.8  PLT 239   ------------------------------------------------------------------------------------------------------------------ Chemistries  Recent Labs  Lab 02/20/21 0440  NA 136  K 4.5  CL 101  CO2 27  GLUCOSE 224*  BUN 18  CREATININE 0.87  CALCIUM 9.6   RADIOLOGY:  No results found. ASSESSMENT AND PLAN:  64 year old male with a known history of COPD and chronic respiratory failure on 2 L oxygen, depression, GERD is admitted for acute on chronic hypoxic respiratory failure due to COPD exacerbation  Acute on chronic hypoxic respiratory failure due to COPD exacerbation He still has significant wheezing, cough and chest tightness Change his nebulizer to every 4 hours for now Continue Dulera 2 puffs inhaled twice a day along with Singulair Mucinex 600 mg p.o. twice daily Albuterol every 2 hours as needed Robitussin 5 mL every 4 hours as needed On prednisone 40 mg once daily Azithromycin 500 mg p.o. daily We will consult pulmonary  GERD Continue Protonix 20 mg p.o. daily  Body mass index is 25.07 kg/m.  Net IO Since Admission: 1,038.35 mL [02/20/21 1455]      Status is: Observation  The patient remains OBS appropriate and will d/c before 2 midnights.  Dispo: The patient is from: Home              Anticipated d/c is to: Home              Patient currently is not medically stable to d/c.   Difficult to place patient No   DVT prophylaxis:       enoxaparin (LOVENOX) injection 40 mg Start: 02/19/21 1015  Family Communication: discussed with patient   All the records are reviewed and case discussed with Care Management/Social Worker. Management plans discussed with the patient, family and they are in agreement.  CODE STATUS: Full Code Level of care: Med-Surg  TOTAL TIME TAKING CARE OF THIS PATIENT: 35 minutes.   More than 50% of the time was spent in counseling/coordination of  care: YES  POSSIBLE D/C IN 1-2 DAYS, DEPENDING ON CLINICAL CONDITION.   Delfino Lovett M.D on 02/20/2021 at 2:55 PM  [facility]   CC: Primary care physician; Center, [name]  Note: This dictation was prepared with Nurse, children's dictation along with smaller phrase technology. Any transcriptional errors that result from this process are unintentional.

## 2021-02-20 NOTE — Consult Note (Signed)
Pulmonary Medicine          Date: 02/20/2021,   MRN# [id number] Bryan Kelley [birth year]     AdmissionWeight: 77 kg                 CurrentWeight: 77 kg      CHIEF COMPLAINT:   Copd exacerbation   HISTORY OF PRESENT ILLNESS   This is a 65 yo male, stop smoking 1 month ago, last admission for copd exacerbation 2 months ago. Followed at [facility]. Came in with diff breathing. Asked to see regarding persistent wheezing. He thinks his sister's next door smoking triggered the flare. No pets, mold, or other exposure to noxious inhalants noted. No again, pleurisy, leg pain, edema,recent travel, or fever.  Hx of gerd, minimum rhinitis. He mentioned he had a spirometry and was told it is bad. significant   PAST MEDICAL HISTORY   Past Medical History:  Diagnosis Date  . Asthma   . COPD (chronic obstructive pulmonary disease) (HCC)   . Depression   . GERD (gastroesophageal reflux disease)   . Vision loss, left eye      SURGICAL HISTORY   Past Surgical History:  Procedure Laterality Date  . EYE SURGERY  2009, about 0998,[id number]   steel in left eye on the job, legally blind      FAMILY HISTORY   Family History  Problem Relation Age of Onset  . Diabetes Mother   . Hypertension Mother   . Stroke Mother   . Asthma Father   . Diabetes Father   . Diabetes Sister   . Hypertension Sister   . Diabetes Brother   . Hypertension Brother      SOCIAL HISTORY   Social History   Tobacco Use  . Smoking status: Former Smoker    Packs/day: 0.50    Years: 40.00    Pack years: 20.00    Types: Cigarettes    Quit date: 2020    Years since quitting: 2.3  . Smokeless tobacco: Never Used  Vaping Use  . Vaping Use: Never used  Substance Use Topics  . Alcohol use: Yes  . Drug use: No     MEDICATIONS    Home Medication:    Current Medication:  Current Facility-Administered Medications:  .  albuterol (PROVENTIL) (2.5 MG/3ML) 0.083% nebulizer solution  2.5 mg, 2.5 mg, Nebulization, Q2H PRN, Agbata, Tochukwu, MD, 2.5 mg at 02/20/21 1555 .  azithromycin (ZITHROMAX) tablet 500 mg, 500 mg, Oral, Daily, Tressie Ellis, RPH, 500 mg at 02/20/21 1401 .  enoxaparin (LOVENOX) injection 40 mg, 40 mg, Subcutaneous, Q24H, Agbata, Tochukwu, MD, 40 mg at 02/20/21 1013 .  feeding supplement (ENSURE ENLIVE / ENSURE PLUS) liquid 237 mL, 237 mL, Oral, TID BM, Agbata, Tochukwu, MD, 237 mL at 02/20/21 1401 .  guaiFENesin (MUCINEX) 12 hr tablet 600 mg, 600 mg, Oral, BID, Agbata, Tochukwu, MD, 600 mg at 02/20/21 0915 .  guaiFENesin-dextromethorphan (ROBITUSSIN DM) 100-10 MG/5ML syrup 5 mL, 5 mL, Oral, Q4H PRN, Agbata, Tochukwu, MD, 5 mL at 02/20/21 1613 .  ipratropium-albuterol (DUONEB) 0.5-2.5 (3) MG/3ML nebulizer solution 3 mL, 3 mL, Nebulization, Q4H, Sherryll Burger, Vipul, MD .  mometasone-formoterol (DULERA) 200-5 MCG/ACT inhaler 2 puff, 2 puff, Inhalation, BID, Agbata, Tochukwu, MD, 2 puff at 02/20/21 0916 .  montelukast (SINGULAIR) tablet 10 mg, 10 mg, Oral, Daily, Agbata, Tochukwu, MD, 10 mg at 02/20/21 0915 .  multivitamin with minerals tablet 1 tablet, 1 tablet, Oral, Daily, Agbata, Tochukwu, MD,  1 tablet at 02/20/21 0915 .  pantoprazole (PROTONIX) EC tablet 20 mg, 20 mg, Oral, Daily, Agbata, Tochukwu, MD, 20 mg at 02/20/21 0916 .  [COMPLETED] methylPREDNISolone sodium succinate (SOLU-MEDROL) 40 mg/mL injection 40 mg, 40 mg, Intravenous, Q12H, 40 mg at 02/19/21 2207 **FOLLOWED BY** predniSONE (DELTASONE) tablet 40 mg, 40 mg, Oral, Q breakfast, Agbata, Tochukwu, MD, 40 mg at 02/20/21 1013    ALLERGIES   Patient has no known allergies.     REVIEW OF SYSTEMS    Review of Systems:  Gen:  Denies  fever, sweats, chills weigh loss  HEENT: Denies blurred vision, double vision, ear pain, eye pain, hearing loss, nose bleeds, sore throat Cardiac:  No dizziness, chest pain or heaviness, chest tightness,edema Resp:   +cough or sputum porduction, +shortness of breath,  +wheezing, - hemoptysis,  Gi: Denies swallowing difficulty, stomach pain, nausea or vomiting, diarrhea, constipation, bowel incontinence Gu:  Denies bladder incontinence, burning urine Ext:   Denies Joint pain, stiffness or swelling Skin: Denies  skin rash, easy bruising or bleeding or hives Endoc:  Denies polyuria, polydipsia , polyphagia or weight change Psych:   Denies depression, insomnia or hallucinations   Other:  All other systems negative   VS: BP (!) 166/79   Pulse 75   Temp 98.3 F (36.8 C)   Resp 16   Ht 5\' 9"  (1.753 m)   Wt 77 kg   SpO2 94%   BMI 25.07 kg/m      PHYSICAL EXAM    GENERAL:NAD, no fevers, chills, no weakness no fatigue, laying flat in bed, well nourished HEAD: Normocephalic, atraumatic.  EYES: Pupils equal, round, reactive to light. Extraocular muscles intact. No scleral icterus.  MOUTH: Moist mucosal membrane. Dentition intact. No abscess noted.  EAR, NOSE, THROAT: Clear without exudates. No external lesions.  NECK: Supple. No thyromegaly. No nodules. No JVD. No stridor PULMONARY: +wheezes, moving air CARDIOVASCULAR: S1 and S2. Regular rate and rhythm. No murmurs, rubs, or gallops. No edema. Pedal pulses 2+ bilaterally.  GASTROINTESTINAL: Soft, nontender, nondistended. No masses. Positive bowel sounds. No hepatosplenomegaly.  MUSCULOSKELETAL: No swelling, clubbing, or edema. Range of motion full in all extremities.  NEUROLOGIC: Cranial nerves II through XII are intact. No gross focal neurological deficits. Sensation intact. Reflexes intact.  SKIN: No ulceration, lesions, rashes, or cyanosis. Skin warm and dry. Turgor intact.  PSYCHIATRIC: Mood, affect within normal limits. The patient is awake, alert and oriented x 3. Insight, judgment intact.       IMAGING    DG Chest Port 1 View  Result Date: 02/19/2021 CLINICAL DATA:  Shortness of breath EXAM: PORTABLE CHEST 1 VIEW COMPARISON:  11/22/2020 FINDINGS: The heart size and mediastinal contours  are within normal limits. Both lungs are clear. No pleural effusion or pneumothorax. The visualized skeletal structures are unremarkable. IMPRESSION: No acute process in the chest. Electronically Signed   By: 11/24/2020 M.D.   On: 02/19/2021 08:13   Eosinophils normal   ASSESSMENT/PLAN   This is a 65 yo af male, ex smoker (quit 1 month ago, known to have copd, comes in with copd exacerbation, cxr showed no pneumonia. Still wheezing, some better since being here.  -will conitinue dulera, spiriva,albuterol, singulair, prednisone as ordered -stay off cigarettes -out patient pft -ck ige, a-1-antitrypsin phenotype -dvt prophylaxis -following with you   Thank you for allowing me to participate in the care of this patient.   Patient/Family are satisfied with care plan and all questions have been answered.  This  document was prepared using Conservation officer, historic buildings and may include unintentional dictation errors.     Ned Clines, M.D.  Division of Pulmonary & Critical Care Medicine  [facility]

## 2021-02-20 NOTE — Progress Notes (Signed)
Pt is very tight and wheezy. He is using accessory muscles, tripoding and grunting. After the nebulizer treatment, there was a slight improvement in BS. The nebulizer treatments are to be changed from Q6 to Q4.

## 2021-02-21 DIAGNOSIS — J441 Chronic obstructive pulmonary disease with (acute) exacerbation: Secondary | ICD-10-CM | POA: Diagnosis present

## 2021-02-21 DIAGNOSIS — Z825 Family history of asthma and other chronic lower respiratory diseases: Secondary | ICD-10-CM | POA: Diagnosis not present

## 2021-02-21 DIAGNOSIS — I10 Essential (primary) hypertension: Secondary | ICD-10-CM | POA: Diagnosis present

## 2021-02-21 DIAGNOSIS — Z79899 Other long term (current) drug therapy: Secondary | ICD-10-CM | POA: Diagnosis not present

## 2021-02-21 DIAGNOSIS — J440 Chronic obstructive pulmonary disease with acute lower respiratory infection: Secondary | ICD-10-CM | POA: Diagnosis present

## 2021-02-21 DIAGNOSIS — Z823 Family history of stroke: Secondary | ICD-10-CM | POA: Diagnosis not present

## 2021-02-21 DIAGNOSIS — J209 Acute bronchitis, unspecified: Secondary | ICD-10-CM | POA: Diagnosis present

## 2021-02-21 DIAGNOSIS — H548 Legal blindness, as defined in USA: Secondary | ICD-10-CM | POA: Diagnosis present

## 2021-02-21 DIAGNOSIS — Z8249 Family history of ischemic heart disease and other diseases of the circulatory system: Secondary | ICD-10-CM | POA: Diagnosis not present

## 2021-02-21 DIAGNOSIS — Z833 Family history of diabetes mellitus: Secondary | ICD-10-CM | POA: Diagnosis not present

## 2021-02-21 DIAGNOSIS — E440 Moderate protein-calorie malnutrition: Secondary | ICD-10-CM | POA: Diagnosis present

## 2021-02-21 DIAGNOSIS — Z20822 Contact with and (suspected) exposure to covid-19: Secondary | ICD-10-CM | POA: Diagnosis present

## 2021-02-21 DIAGNOSIS — K219 Gastro-esophageal reflux disease without esophagitis: Secondary | ICD-10-CM | POA: Diagnosis present

## 2021-02-21 DIAGNOSIS — Z6825 Body mass index (BMI) 25.0-25.9, adult: Secondary | ICD-10-CM | POA: Diagnosis not present

## 2021-02-21 DIAGNOSIS — Z87891 Personal history of nicotine dependence: Secondary | ICD-10-CM | POA: Diagnosis not present

## 2021-02-21 DIAGNOSIS — J9621 Acute and chronic respiratory failure with hypoxia: Secondary | ICD-10-CM | POA: Diagnosis present

## 2021-02-21 DIAGNOSIS — R0602 Shortness of breath: Secondary | ICD-10-CM | POA: Diagnosis present

## 2021-02-21 DIAGNOSIS — Z9981 Dependence on supplemental oxygen: Secondary | ICD-10-CM | POA: Diagnosis not present

## 2021-02-21 LAB — BASIC METABOLIC PANEL
Anion gap: 10 (ref 5–15)
BUN: 17 mg/dL (ref 8–23)
CO2: 30 mmol/L (ref 22–32)
Calcium: 9.2 mg/dL (ref 8.9–10.3)
Chloride: 98 mmol/L (ref 98–111)
Creatinine, Ser: 0.8 mg/dL (ref 0.61–1.24)
GFR, Estimated: >60 mL/min (ref >60)
Glucose, Bld: 172 mg/dL — ABNORMAL HIGH (ref 70–99)
Potassium: 3.4 mmol/L — ABNORMAL LOW (ref 3.5–5.1)
Sodium: 138 mmol/L (ref 135–145)

## 2021-02-21 LAB — CBC
HCT: 40.8 % (ref 39.0–52.0)
Hemoglobin: 13.6 g/dL (ref 13.0–17.0)
MCH: 29.5 pg (ref 26.0–34.0)
MCHC: 33.3 g/dL (ref 30.0–36.0)
MCV: 88.5 fL (ref 80.0–100.0)
Platelets: 239 10*3/uL (ref 150–400)
RBC: 4.61 MIL/uL (ref 4.22–5.81)
RDW: 13.1 % (ref 11.5–15.5)
WBC: 15.6 10*3/uL — ABNORMAL HIGH (ref 4.0–10.5)
nRBC: 0 % (ref 0.0–0.2)

## 2021-02-21 MED ORDER — ACETYLCYSTEINE 20 % IN SOLN
2.0000 mL | Freq: Two times a day (BID) | RESPIRATORY_TRACT | Status: DC
  Administered 2021-02-21 – 2021-02-22 (×2): 2 mL via RESPIRATORY_TRACT
  Filled 2021-02-21 (×3): qty 4

## 2021-02-21 MED ORDER — METHYLPREDNISOLONE SODIUM SUCC 40 MG IJ SOLR
40.0000 mg | Freq: Two times a day (BID) | INTRAMUSCULAR | Status: DC
  Administered 2021-02-21 – 2021-02-22 (×2): 40 mg via INTRAVENOUS
  Filled 2021-02-21 (×2): qty 1

## 2021-02-21 NOTE — Progress Notes (Signed)
Pulmonary Medicine          Date: 02/21/2021,   MRN# [id number] Bryan Kelley 1956/07/07       HISTORY OF PRESENT ILLNESS   This is a 65 yo male, stop smoking 1 month ago, last admission for copd exacerbation 2 months ago. Followed at [facility]. Came in with diff breathing. Asked to see regarding persistent wheezing. He thinks his sister's next door smoking triggered the flare. No pets, mold, or other exposure to noxious inhalants noted. No angina, pleurisy, leg pain, edema,recent travel, or fever.  Hx of gerd, minimum rhinitis. He mentioned he had a spirometry and was told it is bad. Significant  Today still wheezing, cough, difficulty mobilizing secretions.    PAST MEDICAL HISTORY   Past Medical History:  Diagnosis Date  . Asthma   . COPD (chronic obstructive pulmonary disease) (HCC)   . Depression   . GERD (gastroesophageal reflux disease)   . Vision loss, left eye      SURGICAL HISTORY   Past Surgical History:  Procedure Laterality Date  . EYE SURGERY  2009, about [id number]   steel in left eye on the job, legally blind      FAMILY HISTORY   Family History  Problem Relation Age of Onset  . Diabetes Mother   . Hypertension Mother   . Stroke Mother   . Asthma Father   . Diabetes Father   . Diabetes Sister   . Hypertension Sister   . Diabetes Brother   . Hypertension Brother      SOCIAL HISTORY   Social History   Tobacco Use  . Smoking status: Former Smoker    Packs/day: 0.50    Years: 40.00    Pack years: 20.00    Types: Cigarettes    Quit date: 2020    Years since quitting: 2.3  . Smokeless tobacco: Never Used  Vaping Use  . Vaping Use: Never used  Substance Use Topics  . Alcohol use: Yes  . Drug use: No     MEDICATIONS    Home Medication:    Current Medication:  Current Facility-Administered Medications:  .  albuterol (PROVENTIL) (2.5 MG/3ML) 0.083% nebulizer solution 2.5 mg, 2.5 mg, Nebulization, Q2H PRN,  Agbata, Tochukwu, MD, 2.5 mg at 02/20/21 1555 .  enoxaparin (LOVENOX) injection 40 mg, 40 mg, Subcutaneous, Q24H, Agbata, Tochukwu, MD, 40 mg at 02/21/21 0850 .  feeding supplement (ENSURE ENLIVE / ENSURE PLUS) liquid 237 mL, 237 mL, Oral, TID BM, Agbata, Tochukwu, MD, 237 mL at 02/21/21 1427 .  guaiFENesin (MUCINEX) 12 hr tablet 600 mg, 600 mg, Oral, BID, Agbata, Tochukwu, MD, 600 mg at 02/21/21 0840 .  guaiFENesin-dextromethorphan (ROBITUSSIN DM) 100-10 MG/5ML syrup 5 mL, 5 mL, Oral, Q4H PRN, Agbata, Tochukwu, MD, 5 mL at 02/21/21 0845 .  ipratropium-albuterol (DUONEB) 0.5-2.5 (3) MG/3ML nebulizer solution 3 mL, 3 mL, Nebulization, Q4H, Sherryll Burger, Vipul, MD, 3 mL at 02/21/21 1112 .  methylPREDNISolone sodium succinate (SOLU-MEDROL) 40 mg/mL injection 40 mg, 40 mg, Intravenous, Q12H, Sherryll Burger, Vipul, MD .  mometasone-formoterol (DULERA) 200-5 MCG/ACT inhaler 2 puff, 2 puff, Inhalation, BID, Agbata, Tochukwu, MD, 2 puff at 02/21/21 0842 .  montelukast (SINGULAIR) tablet 10 mg, 10 mg, Oral, Daily, Agbata, Tochukwu, MD, 10 mg at 02/21/21 0840 .  multivitamin with minerals tablet 1 tablet, 1 tablet, Oral, Daily, Agbata, Tochukwu, MD, 1 tablet at 02/21/21 0839 .  pantoprazole (PROTONIX) EC tablet 20 mg, 20 mg, Oral, Daily, Agbata, Tochukwu, MD, 20  mg at 02/21/21 0841    ALLERGIES   Patient has no known allergies.     REVIEW OF SYSTEMS    Review of Systems:  Gen:  Denies  fever, sweats, chills weigh loss  HEENT: Denies blurred vision, double vision, ear pain, eye pain, hearing loss, nose bleeds, sore throat Cardiac:  No dizziness, chest pain or heaviness, chest tightness,edema Resp:   ++cough or sputum porduction,+  shortness of breath, wheezing,- hemoptysis,  Gi: Denies swallowing difficulty, stomach pain, nausea or vomiting, diarrhea, constipation, bowel incontinence Gu:  Denies bladder incontinence, burning urine Ext:   Denies Joint pain, stiffness or swelling Skin: Denies  skin rash, easy  bruising or bleeding or hives Endoc:  Denies polyuria, polydipsia , polyphagia or weight change Psych:   Denies depression, insomnia or hallucinations   Other:  All other systems negative   VS: BP (!) 149/85 (BP Location: Right Arm)   Pulse 66   Temp 97.8 F (36.6 C)   Resp 18   Ht 5\' 9"  (1.753 m)   Wt 77 kg   SpO2 97%   BMI 25.07 kg/m      PHYSICAL EXAM    GENERAL:NAD, no fevers, chills, no weakness no fatigue HEAD: Normocephalic, atraumatic.  EYES: Pupils equal, round, reactive to light. Extraocular muscles intact. No scleral icterus.  MOUTH: Moist mucosal membrane. Dentition intact. No abscess noted.  EAR, NOSE, THROAT: Clear without exudates. No external lesions.  NECK: Supple. No thyromegaly. No nodules. No JVD.  PULMONARY: Diffuse coarse rhonchi + exp wheezes CARDIOVASCULAR: S1 and S2. Regular rate and rhythm. No murmurs, rubs, or gallops. No edema. Pedal pulses 2+ bilaterally.  GASTROINTESTINAL: Soft, nontender, nondistended. No masses. Positive bowel sounds. No hepatosplenomegaly.  MUSCULOSKELETAL: No swelling, clubbing, or edema. Range of motion full in all extremities.  NEUROLOGIC: Cranial nerves II through XII are intact. No gross focal neurological deficits. Sensation intact. Reflexes intact.  SKIN: No ulceration, lesions, rashes, or cyanosis. Skin warm and dry. Turgor intact.  PSYCHIATRIC: Mood, affect within normal limits. The patient is awake, alert and oriented x 3. Insight, judgment intact.       IMAGING    DG Chest Port 1 View  Result Date: 02/19/2021 CLINICAL DATA:  Shortness of breath EXAM: PORTABLE CHEST 1 VIEW COMPARISON:  11/22/2020 FINDINGS: The heart size and mediastinal contours are within normal limits. Both lungs are clear. No pleural effusion or pneumothorax. The visualized skeletal structures are unremarkable. IMPRESSION: No acute process in the chest. Electronically Signed   By: 11/24/2020 M.D.   On: 02/19/2021 08:13       ASSESSMENT/PLAN   This is a 65 yo af male, ex smoker (quit 1 month ago, known to have copd, comes in with copd exacerbation, cxr showed no pneumonia. Still wheezing today on broad coverage. -will conitinue dulera, spiriva,albuterol, singulair, sulumedrol as ordered -stay off cigarettes -out patient pft -ck ige, a-1-antitrypsin phenotype pending -dvt prophylaxis -incentive spirometry/flutter valve -consider a trial of mucomyst to help mobilze his secretions -following with you      Thank you for allowing me to participate in the care of this patient.   Patient/Family are satisfied with care plan and all questions have been answered.  This document was prepared using Dragon voice recognition software and may include unintentional dictation errors.     65, M.D.  Division of Pulmonary & Critical Care Medicine  [facility]

## 2021-02-21 NOTE — Progress Notes (Signed)
1        [place] at [facility]   PATIENT NAME: Bryan Kelley    MR#:  [id number]  DATE OF BIRTH:  26-Mar-1956  SUBJECTIVE:  CHIEF COMPLAINT:   Chief Complaint  Patient presents with  . Shortness of Breath    Sob x2 days   Patient continues to have chest tightness, shortness of breath and wheezing.  Coughing present, nonproductive.  Complains of rib cage pain from coughing REVIEW OF SYSTEMS:  Review of Systems  Constitutional: Positive for malaise/fatigue. Negative for diaphoresis, fever and weight loss.  HENT: Negative for ear discharge, ear pain, hearing loss, nosebleeds, sore throat and tinnitus.   Eyes: Negative for blurred vision and pain.  Respiratory: Positive for cough, shortness of breath and wheezing. Negative for hemoptysis.   Cardiovascular: Positive for chest pain. Negative for palpitations, orthopnea and leg swelling.  Gastrointestinal: Negative for abdominal pain, blood in stool, constipation, diarrhea, heartburn, nausea and vomiting.  Genitourinary: Negative for dysuria, frequency and urgency.  Musculoskeletal: Negative for back pain and myalgias.  Skin: Negative for itching and rash.  Neurological: Negative for dizziness, tingling, tremors, focal weakness, seizures, weakness and headaches.  Psychiatric/Behavioral: Negative for depression. The patient is not nervous/anxious.    DRUG ALLERGIES:  No Known Allergies VITALS:  Blood pressure 96/76, pulse 79, temperature 97.8 F (36.6 C), temperature source Oral, resp. rate 19, height 5\' 9"  (1.753 m), weight 77 kg, SpO2 99 %. PHYSICAL EXAMINATION:  Physical Exam  65 year old male lying in the bed in acute respiratory distress Lungs decreased breath sound at the bases, wheezing throughout both lungs.  Using accessory muscles of respiration.  No rhonchi or rales Cardiovascular S1-S2 normal no murmurs or gallop Abdomen soft nontender nondistended bowel sounds present Neuro alert and oriented Extremities dry  skin and onychomycosis in both lower extremity nails LABORATORY PANEL:  Male CBC Recent Labs  Lab 02/21/21 0433  WBC 15.6*  HGB 13.6  HCT 40.8  PLT 239   ------------------------------------------------------------------------------------------------------------------ Chemistries  Recent Labs  Lab 02/21/21 0433  NA 138  K 3.4*  CL 98  CO2 30  GLUCOSE 172*  BUN 17  CREATININE 0.80  CALCIUM 9.2   RADIOLOGY:  No results found. ASSESSMENT AND PLAN:  65 year old male with a known history of COPD and chronic respiratory failure on 2 L oxygen, depression, GERD is admitted for acute on chronic hypoxic respiratory failure due to COPD exacerbation  Acute on chronic hypoxic respiratory failure due to COPD exacerbation He still has significant wheezing, cough and chest tightness Change his nebulizer to every 4 hours for now Continue Dulera 2 puffs inhaled twice a day along with Singulair Mucinex 600 mg p.o. twice daily Albuterol every 2 hours as needed Robitussin 5 mL every 4 hours as needed We will switch his prednisone to IV Solu-Medrol 40 mg twice daily Azithromycin 500 mg p.o. daily Appreciate pulmonary input, checking IgE  GERD Continue Protonix 20 mg p.o. daily  Will request progressive mobility    Body mass index is 25.07 kg/m.  Net IO Since Admission: 1,278.35 mL [02/21/21 1245]      Status is: Observation  The patient remains OBS appropriate and will d/c before 2 midnights.  Dispo: The patient is from: Home              Anticipated d/c is to: Home              Patient currently is not medically stable to d/c.   Difficult to place  patient No   DVT prophylaxis:       enoxaparin (LOVENOX) injection 40 mg Start: 02/19/21 1015     Family Communication: discussed with patient   All the records are reviewed and case discussed with Care Management/Social Worker. Management plans discussed with the patient, nursing and they are in agreement.  CODE  STATUS: Full Code Level of care: Med-Surg  TOTAL TIME TAKING CARE OF THIS PATIENT: 35 minutes.   More than 50% of the time was spent in counseling/coordination of care: YES  POSSIBLE D/C IN 1-2 DAYS, DEPENDING ON CLINICAL CONDITION.   Delfino Lovett M.D on 02/21/2021 at 12:45 PM  [facility]   CC: Primary care physician; Center, [name]  Note: This dictation was prepared with Nurse, children's dictation along with smaller phrase technology. Any transcriptional errors that result from this process are unintentional.

## 2021-02-21 NOTE — TOC Initial Note (Signed)
Transition of Care [facility]) - Initial/Assessment Note    Patient Details  Name: Bryan Kelley MRN: [id number] Date of Birth: March 01, 1956  Transition [facility] Care [facility]) CM/SW Contact:    Chapman Fitch, RN Phone Number: 02/21/2021, 2:40 PM  Clinical Narrative:                 Patient admitted from home with COPD Patient states that he lives at home with sister Bryan Kelley - Sister drives patient to appointments.  Some times his sister doesn't feel well which limited transportation.  Provided patient with information on [facility] services  Patient denies issues obtaining medications Patient has home O2 and wears 2L.  States his sister will be picking him up at discharge, and will bring the portable o2  Expected Discharge Plan: Home/Self Care Barriers to Discharge: Continued Medical Work up   Patient Goals and CMS Choice        Expected Discharge Plan and Services Expected Discharge Plan: Home/Self Care                                              Prior Living Arrangements/Services   Lives with:: Siblings Patient language and need for interpreter reviewed:: Yes Do you feel safe going back to the place where you live?: Yes      Need for Family Participation in Patient Care: Yes (Comment) Care giver support system in place?: Yes (comment) Current home services: DME Criminal Activity/Legal Involvement Pertinent to Current Situation/Hospitalization: No - Comment as needed  Activities of Daily Living Home Assistive Devices/Equipment: Eyeglasses ADL Screening (condition at time of admission) Patient's cognitive ability adequate to safely complete daily activities?: Yes Is the patient deaf or have difficulty hearing?: No Does the patient have difficulty seeing, even when wearing glasses/contacts?: No Does the patient have difficulty concentrating, remembering, or making decisions?: No Patient able to express need for assistance with ADLs?: Yes Does the patient have  difficulty dressing or bathing?: No Independently performs ADLs?: Yes (appropriate for developmental age) Does the patient have difficulty walking or climbing stairs?: No Weakness of Legs: None Weakness of Arms/Hands: None  Permission Sought/Granted                  Emotional Assessment       Orientation: : Oriented to Self,Oriented to Place,Oriented to  Time,Oriented to Situation Alcohol / Substance Use: Not Applicable Psych Involvement: No (comment)  Admission diagnosis:  Shortness of breath [R06.02] COPD exacerbation (HCC) [J44.1] COPD with acute exacerbation (HCC) [J44.1] Patient Active Problem List   Diagnosis Date Noted  . Malnutrition of moderate degree 02/20/2021  . Chronic respiratory failure (HCC) 02/19/2021  . Acute respiratory failure (HCC) 10/06/2020  . Tobacco abuse 07/25/2020  . HTN (hypertension) 07/25/2020  . Acute on chronic respiratory failure with hypercapnia (HCC) 12/06/2019  . Nausea vomiting and diarrhea 12/06/2019  . Acute respiratory disease due to COVID-19 virus 12/06/2019  . COPD (chronic obstructive pulmonary disease) (HCC) 10/30/2019  . Hyperglycemia   . COPD with acute exacerbation (HCC) 10/29/2019  . COPD exacerbation (HCC) 10/29/2019  . Epigastric abdominal pain 08/12/2012  . Alcohol abuse 03/14/2012  . Elevated BP 12/07/2011  . ED (erectile dysfunction) 07/28/2011  . NICOTINE ADDICTION 10/29/2009  . Acute bronchitis 05/07/2009  . 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 ANKLE PAIN, RIGHT 10/04/2008  . FATIGUE 10/04/2008  . ANXIETY 02/17/2008  .  ASTHMA 02/17/2008  . GERD (gastroesophageal reflux disease) 02/17/2008  . BPH (benign prostatic hyperplasia) 02/17/2008   PCP:  [facility], [facility]:   MEDICAL [address] [place], [place] - [address] [place] [address] Phone: [phone] Fax: [phone]  [facility] - [place], [place] - [place], [place] - [address] [place] [address] Phone: [phone] Fax: [phone]  [facility] - [place], [place] - [address] [place] [address] Phone: [phone] Fax: [phone]  [facility] [address] [place]), [place] - [address]) [place] [address] Phone: [phone] Fax: [phone]     Social Determinants of Health (SDOH) Interventions    Readmission Risk Interventions No flowsheet data found.

## 2021-02-21 NOTE — Progress Notes (Signed)
Occupational Therapy Treatment Patient Details Name: Bryan Kelley MRN: [id number] DOB: 29-Sep-1956 Today's Date: 02/21/2021    History of present illness 65 y.o. male with medical history significant for COPD with chronic respiratory failure on 2 L of oxygen continuous, history of asthma, history of depression and GERD who presents to the ER for evaluation of a 3-day history of worsening shortness of breath from his baseline associated with wheezing and a cough productive of brown phlegm.  He also has associated chest tightness.   OT comments  Pt seen for OT treatment on this date. Upon arrival to room, pt seated upright in bed, on 2L O2, and agreeable to OT tx. This date, pt able to independently walk 176f on 2L (SpO2 95% following activity) and perform toilet transfer and hand hygiene on RA (SpO2 92% after activity). Pt educated on energy conservation strategies including pursed lip breathing, activity pacing, home/routines modifications, work simplification, AE/DME, prioritizing of meaningful occupations, and falls prevention; handout provided and pt able to state 4 energy conservation strategies he plans to use at home at end of session. Pt currently demonstrates baseline independence to perform ADL and mobility tasks and demonstrates understanding of energy conservation strategies to use during ADLs/functional mobility. No additional skilled OT needs identified at this time. Will sign off. Please re-consult if additional OT needs arise.    Follow Up Recommendations  No OT follow up;Supervision - Intermittent    Equipment Recommendations  Tub/shower seat       Precautions / Restrictions Precautions Precautions: None Restrictions Weight Bearing Restrictions: No       Mobility Bed Mobility Overal bed mobility: Independent                  Transfers Overall transfer level: Independent   Transfers: Sit to/from Stand Sit to Stand: Independent         General transfer  comment: Safe technique with ease of mobility    Balance Overall balance assessment: No apparent balance deficits (not formally assessed) Sitting-balance support: No upper extremity supported;Feet supported Sitting balance-Leahy Scale: Normal Sitting balance - Comments: normal sitting balance at EOB   Standing balance support: No upper extremity supported;During functional activity Standing balance-Leahy Scale: Good Standing balance comment: Good standing balance while walking 160 feet                           ADL either performed or assessed with clinical judgement   ADL Overall ADL's : Modified independent                                       General ADL Comments: Pt MOD-I to walk 1650fon 2L (SpO2 95% after) and perform toilet transfer and hand hygiene on RA (SpO2 92% after).               Cognition Arousal/Alertness: Awake/alert Behavior During Therapy: WFL for tasks assessed/performed Overall Cognitive Status: Within Functional Limits for tasks assessed                                          Exercises Other Exercises Other Exercises: Pt educated on energy conservation strategies including pursed lip breathing, activity pacing, home/routines modifications, work simplification, AE/DME, prioritizing of meaningful occupations, and falls prevention; handout provided  and pt able to state 4 energy conservation strategies he plans to use at home at end of session.           Pertinent Vitals/ Pain       Pain Assessment: No/denies pain         Frequency  Min 1X/week        Progress Toward Goals  OT Goals(current goals can now be found in the care plan section)  Progress towards OT goals: Progressing toward goals  Acute Rehab OT Goals Patient Stated Goal: to improve breathing OT Goal Formulation: With patient Time For Goal Achievement: 03/05/21 Potential to Achieve Goals: Good  Plan Discharge plan remains  appropriate;All goals met and education completed, patient discharged from [place] OT "6 Clicks" Daily Activity     Outcome Measure   Help from another person eating meals?: None Help from another person taking care of personal grooming?: None Help from another person toileting, which includes using toliet, bedpan, or urinal?: None Help from another person bathing (including washing, rinsing, drying)?: A Little Help from another person to put on and taking off regular upper body clothing?: None Help from another person to put on and taking off regular lower body clothing?: A Little 6 Click Score: 22    End of Session Equipment Utilized During Treatment: Oxygen  OT Visit Diagnosis: Muscle weakness (generalized) (M62.81)   Activity Tolerance Patient tolerated treatment well   Patient Left in bed;with call bell/phone within reach   Nurse Communication Mobility status        Time: 4128-7867 OT Time Calculation (min): 16 min  Charges: OT General Charges $OT Visit: 1 Visit OT Treatments $Self Care/Home Management : 8-22 mins  Fredirick Maudlin, [place]

## 2021-02-22 LAB — CBC
HCT: 40.6 % (ref 39.0–52.0)
Hemoglobin: 13.5 g/dL (ref 13.0–17.0)
MCH: 29.6 pg (ref 26.0–34.0)
MCHC: 33.3 g/dL (ref 30.0–36.0)
MCV: 89 fL (ref 80.0–100.0)
Platelets: 250 10*3/uL (ref 150–400)
RBC: 4.56 MIL/uL (ref 4.22–5.81)
RDW: 13.2 % (ref 11.5–15.5)
WBC: 13.6 10*3/uL — ABNORMAL HIGH (ref 4.0–10.5)
nRBC: 0 % (ref 0.0–0.2)

## 2021-02-22 LAB — BASIC METABOLIC PANEL
Anion gap: 11 (ref 5–15)
BUN: 19 mg/dL (ref 8–23)
CO2: 29 mmol/L (ref 22–32)
Calcium: 9.6 mg/dL (ref 8.9–10.3)
Chloride: 96 mmol/L — ABNORMAL LOW (ref 98–111)
Creatinine, Ser: 0.8 mg/dL (ref 0.61–1.24)
GFR, Estimated: >60 mL/min (ref >60)
Glucose, Bld: 233 mg/dL — ABNORMAL HIGH (ref 70–99)
Potassium: 4.3 mmol/L (ref 3.5–5.1)
Sodium: 136 mmol/L (ref 135–145)

## 2021-02-22 MED ORDER — DULERA 200-5 MCG/ACT IN AERO: 2.0000 | INHALATION_SPRAY | Freq: Two times a day (BID) | RESPIRATORY_TRACT | 1 refills | Status: DC

## 2021-02-22 MED ORDER — MONTELUKAST SODIUM 10 MG PO TABS: 10.0000 mg | ORAL_TABLET | Freq: Every day | ORAL | 0 refills | Status: DC

## 2021-02-22 MED ORDER — PREDNISONE 10 MG (21) PO TBPK: ORAL_TABLET | ORAL | 0 refills | Status: DC

## 2021-02-22 MED ORDER — GUAIFENESIN ER 600 MG PO TB12: 600.0000 mg | ORAL_TABLET | Freq: Two times a day (BID) | ORAL | 0 refills | Status: AC

## 2021-02-22 MED ORDER — GUAIFENESIN-DM 100-10 MG/5ML PO SYRP: 5.0000 mL | ORAL_SOLUTION | ORAL | 0 refills | Status: AC | PRN

## 2021-02-22 NOTE — TOC Transition Note (Signed)
Transition of Care [facility]) - CM/SW Discharge Note   Patient Details  Name: Bryan Kelley MRN: [id number] Date of Birth: [birth year]  Transition of Care [facility]) CM/SW Contact:  Chapman Fitch, RN Phone Number: 02/22/2021, 10:57 AM   Clinical Narrative:     Patient has been provided with transportation resources Shower chair has been delivered to room Sister to pick up at discharge.  No further TOC needs identified    Final next level of care: Home/Self Care Barriers to Discharge: No Barriers Identified   Patient Goals and CMS Choice        Discharge Placement                       Discharge Plan and Services                DME Arranged: Shower stool DME Agency: [employer] Date DME Agency Contacted: 02/22/21   Representative spoke with at DME Agency: ronda            Social Determinants of Health (SDOH) Interventions     Readmission Risk Interventions No flowsheet data found.

## 2021-02-22 NOTE — Discharge Instructions (Signed)
Chronic Obstructive Pulmonary Disease  Chronic obstructive pulmonary disease (COPD) is a long-term (chronic) lung problem. When you have COPD, it is hard for air to get in and out of your lungs. Usually the condition gets worse over time, and your lungs will never return to normal. There are things you can do to keep yourself as healthy as possible. What are the causes?  Smoking. This is the most common cause.  Certain genes passed from parent to child (inherited). What increases the risk?  Being exposed to secondhand smoke from cigarettes, pipes, or cigars.  Being exposed to chemicals and other irritants, such as fumes and dust in the work environment.  Having chronic lung conditions or infections. What are the signs or symptoms?  Shortness of breath, especially during physical activity.  A long-term cough with a large amount of thick mucus. Sometimes, the cough may not have any mucus (dry cough).  Wheezing.  Breathing quickly.  Skin that looks gray or blue, especially in the fingers, toes, or lips.  Feeling tired (fatigue).  Weight loss.  Chest tightness.  Having infections often.  Episodes when breathing symptoms become much worse (exacerbations). At the later stages of this disease, you may have swelling in the ankles, feet, or legs. How is this treated?  Taking medicines.  Quitting smoking, if you smoke.  Rehabilitation. This includes steps to make your body work better. It may involve a team of specialists.  Doing exercises.  Making changes to your diet.  Using oxygen.  Lung surgery.  Lung transplant.  Comfort measures (palliative care). Follow these instructions at home: Medicines  Take over-the-counter and prescription medicines only as told by your doctor.  Talk to your doctor before taking any cough or allergy medicines. You may need to avoid medicines that cause your lungs to be dry. Lifestyle  If you smoke, stop smoking. Smoking makes the  problem worse.  Do not smoke or use any products that contain nicotine or tobacco. If you need help quitting, ask your doctor.  Avoid being around things that make your breathing worse. This may include smoke, chemicals, and fumes.  Stay active, but remember to rest as well.  Learn and use tips on how to manage stress and control your breathing.  Make sure you get enough sleep. Most adults need at least 7 hours of sleep every night.  Eat healthy foods. Eat smaller meals more often. Rest before meals. Controlled breathing Learn and use tips on how to control your breathing as told by your doctor. Try:  Breathing in (inhaling) through your nose for 1 second. Then, pucker your lips and breath out (exhale) through your lips for 2 seconds.  Putting one hand on your belly (abdomen). Breathe in slowly through your nose for 1 second. Your hand on your belly should move out. Pucker your lips and breathe out slowly through your lips. Your hand on your belly should move in as you breathe out.   Controlled coughing Learn and use controlled coughing to clear mucus from your lungs. Follow these steps: 1. Lean your head a little forward. 2. Breathe in deeply. 3. Try to hold your breath for 3 seconds. 4. Keep your mouth slightly open while coughing 2 times. 5. Spit any mucus out into a tissue. 6. Rest and do the steps again 1 or 2 times as needed. General instructions  Make sure you get all the shots (vaccines) that your doctor recommends. Ask your doctor about a flu shot and a pneumonia shot.    Use oxygen therapy and pulmonary rehabilitation if told by your doctor. If you need home oxygen therapy, ask your doctor if you should buy a tool to measure your oxygen level (oximeter).  Make a COPD action plan with your doctor. This helps you to know what to do if you feel worse than usual.  Manage any other conditions you have as told by your doctor.  Avoid going outside when it is very hot, cold, or  humid.  Avoid people who have a sickness you can catch (contagious).  Keep all follow-up visits. Contact a doctor if:  You cough up more mucus than usual.  There is a change in the color or thickness of the mucus.  It is harder to breathe than usual.  Your breathing is faster than usual.  You have trouble sleeping.  You need to use your medicines more often than usual.  You have trouble doing your normal activities such as getting dressed or walking around the house. Get help right away if:  You have shortness of breath while resting.  You have shortness of breath that stops you from: ? Being able to talk. ? Doing normal activities.  Your chest hurts for longer than 5 minutes.  Your skin color is more blue than usual.  Your pulse oximeter shows that you have low oxygen for longer than 5 minutes.  You have a fever.  You feel too tired to breathe normally. These symptoms may represent a serious problem that is an emergency. Do not wait to see if the symptoms will go away. Get medical help right away. Call your local emergency services (911 in the [place] Do not drive yourself to the hospital. Summary  Chronic obstructive pulmonary disease (COPD) is a long-term lung problem.  The way your lungs work will never return to normal. Usually the condition gets worse over time. There are things you can do to keep yourself as healthy as possible.  Take over-the-counter and prescription medicines only as told by your doctor.  If you smoke, stop. Smoking makes the problem worse. This information is not intended to replace advice given to you by your health care provider. Make sure you discuss any questions you have with your health care provider. Document Revised: 08/28/2020 Document Reviewed: 08/28/2020 Elsevier Patient Education  2021 [employer]vier Inc.   

## 2021-02-22 NOTE — TOC Progression Note (Signed)
Transition of Care [facility]) - Progression Note    Patient Details  Name: Bryan Kelley MRN: [id number] Date of Birth: 1956-02-25  Transition of Care [facility]) CM/SW Contact  Chapman Fitch, RN Phone Number: 02/22/2021, 9:02 AM  Clinical Narrative:    Referral for shower chair given to [facility] with [employer]    Expected Discharge Plan: Home/Self Care Barriers to Discharge: Continued Medical Work up  Expected Discharge Plan and Services Expected Discharge Plan: Home/Self Care                                               Social Determinants of Health (SDOH) Interventions    Readmission Risk Interventions No flowsheet data found.

## 2021-02-22 NOTE — Plan of Care (Signed)
  Problem: Education: Goal: Knowledge of General Education information will improve Description: Including pain rating scale, medication(s)/side effects and non-pharmacologic comfort measures 02/22/2021 1057 by Caffie Sotto, Gilford Rile, RN Outcome: Adequate for Discharge 02/22/2021 0752 by Carrington Clamp, RN Outcome: Progressing   Problem: Health Behavior/Discharge Planning: Goal: Ability to manage health-related needs will improve 02/22/2021 1057 by Carrington Clamp, RN Outcome: Adequate for Discharge 02/22/2021 0752 by Carrington Clamp, RN Outcome: Progressing   Problem: Clinical Measurements: Goal: Ability to maintain clinical measurements within normal limits will improve 02/22/2021 1057 by Carrington Clamp, RN Outcome: Adequate for Discharge 02/22/2021 2992 by Carrington Clamp, RN Outcome: Progressing Goal: Will remain free from infection 02/22/2021 1057 by Carrington Clamp, RN Outcome: Adequate for Discharge 02/22/2021 4268 by Carrington Clamp, RN Outcome: Progressing Goal: Diagnostic test results will improve 02/22/2021 1057 by Carrington Clamp, RN Outcome: Adequate for Discharge 02/22/2021 3419 by Carrington Clamp, RN Outcome: Progressing Goal: Respiratory complications will improve 02/22/2021 1057 by Carrington Clamp, RN Outcome: Adequate for Discharge 02/22/2021 6222 by Carrington Clamp, RN Outcome: Progressing Goal: Cardiovascular complication will be avoided 02/22/2021 1057 by Carrington Clamp, RN Outcome: Adequate for Discharge 02/22/2021 9798 by Carrington Clamp, RN Outcome: Progressing   Problem: Activity: Goal: Risk for activity intolerance will decrease 02/22/2021 1057 by Carrington Clamp, RN Outcome: Adequate for Discharge 02/22/2021 9211 by Carrington Clamp, RN Outcome: Progressing   Problem: Nutrition: Goal: Adequate nutrition will be maintained 02/22/2021 1057 by Carrington Clamp, RN Outcome:  Adequate for Discharge 02/22/2021 0752 by Carrington Clamp, RN Outcome: Progressing   Problem: Coping: Goal: Level of anxiety will decrease 02/22/2021 1057 by Carrington Clamp, RN Outcome: Adequate for Discharge 02/22/2021 9417 by Carrington Clamp, RN Outcome: Progressing   Problem: Elimination: Goal: Will not experience complications related to bowel motility 02/22/2021 1057 by Carrington Clamp, RN Outcome: Adequate for Discharge 02/22/2021 4081 by Carrington Clamp, RN Outcome: Progressing Goal: Will not experience complications related to urinary retention 02/22/2021 1057 by Carrington Clamp, RN Outcome: Adequate for Discharge 02/22/2021 4481 by Carrington Clamp, RN Outcome: Progressing   Problem: Pain Managment: Goal: General experience of comfort will improve 02/22/2021 1057 by Carrington Clamp, RN Outcome: Adequate for Discharge 02/22/2021 8563 by Carrington Clamp, RN Outcome: Progressing   Problem: Safety: Goal: Ability to remain free from injury will improve 02/22/2021 1057 by Carrington Clamp, RN Outcome: Adequate for Discharge 02/22/2021 1497 by Carrington Clamp, RN Outcome: Progressing   Problem: Skin Integrity: Goal: Risk for impaired skin integrity will decrease 02/22/2021 1057 by Carrington Clamp, RN Outcome: Adequate for Discharge 02/22/2021 0263 by Carrington Clamp, RN Outcome: Progressing

## 2021-02-22 NOTE — Progress Notes (Signed)
Reviewed discharge paperwork.  Discussed why pulmonary appointment was not made.  Patient stated that he was not able to get a ride advised to call         MD to explain

## 2021-02-22 NOTE — Plan of Care (Signed)

## 2021-02-23 NOTE — Discharge Summary (Signed)
[place] at [facility]   PATIENT NAME: Bryan Kelley    MR#:  [id number]  DATE OF BIRTH:  1956-07-23  DATE OF ADMISSION:  02/19/2021   ADMITTING PHYSICIAN: Delfino Lovett, MD  DATE OF DISCHARGE: 02/22/2021 12:01 PM  PRIMARY CARE PHYSICIAN: [name]   ADMISSION DIAGNOSIS:  Shortness of breath [R06.02] COPD exacerbation (HCC) [J44.1] COPD with acute exacerbation (HCC) [J44.1] DISCHARGE DIAGNOSIS:  Principal Problem:   COPD with acute exacerbation (HCC) Active Problems:   Acute bronchitis   GERD (gastroesophageal reflux disease)   COPD exacerbation (HCC)   Chronic respiratory failure (HCC)   Malnutrition of moderate degree  SECONDARY DIAGNOSIS:   Past Medical History:  Diagnosis Date  . Asthma   . COPD (chronic obstructive pulmonary disease) (HCC)   . Depression   . GERD (gastroesophageal reflux disease)   . Vision loss, left eye    HOSPITAL COURSE:  65 year old male with a known history of COPD and chronic respiratory failure on 2 L oxygen, depression, GERD is admitted for acute on chronic hypoxic respiratory failure due to COPD exacerbation  Acute on chronic hypoxic respiratory failure due to COPD exacerbation Improved with nebs, inhalers and steroids Seen by pulmo while in the hospital. Requested him to f/up with PCP and Pulmo  - on his 2 liter O2 at D/C  GERD Continue Protonix    DISCHARGE CONDITIONS:  stable CONSULTS OBTAINED:   DRUG ALLERGIES:  No Known Allergies DISCHARGE MEDICATIONS:   Allergies as of 02/22/2021   No Known Allergies     Medication List    STOP taking these medications   nicotine 21 mg/24hr patch Commonly known as: NICODERM CQ - dosed in mg/24 hours   Spiriva Respimat 2.5 MCG/ACT Aers Generic drug: Tiotropium Bromide Monohydrate   Viagra 50 MG tablet Generic drug: sildenafil     TAKE these medications   albuterol 108 (90 Base) MCG/ACT inhaler Commonly known as: VENTOLIN HFA Inhale 2  puffs into the lungs every 6 (six) hours as needed for wheezing or shortness of breath.   albuterol (2.5 MG/3ML) 0.083% nebulizer solution Commonly known as: PROVENTIL Take 2.5 mg by nebulization every 4 (four) hours as needed for wheezing.   Dulera 200-5 MCG/ACT Aero Generic drug: mometasone-formoterol Inhale 2 puffs into the lungs 2 (two) times daily.   guaiFENesin 600 MG 12 hr tablet Commonly known as: MUCINEX Take 1 tablet (600 mg total) by mouth 2 (two) times daily for 7 days.   guaiFENesin-dextromethorphan 100-10 MG/5ML syrup Commonly known as: ROBITUSSIN DM Take 5 mLs by mouth every 4 (four) hours as needed for up to 7 days for cough (chest congestion).   montelukast 10 MG tablet Commonly known as: SINGULAIR Take 1 tablet (10 mg total) by mouth daily.   pantoprazole 20 MG tablet Commonly known as: PROTONIX Take 1 tablet (20 mg total) by mouth daily.   predniSONE 10 MG (21) Tbpk tablet Commonly known as: STERAPRED UNI-PAK 21 TAB Start 60 mg po daily, taper 10 mg daily until done   tamsulosin 0.4 MG Caps capsule Commonly known as: FLOMAX Take 0.4 mg by mouth daily.      DISCHARGE INSTRUCTIONS:   DIET:  Regular diet DISCHARGE CONDITION:  Stable ACTIVITY:  Activity as tolerated OXYGEN:  Home Oxygen: Yes.    Oxygen Delivery: 2 liters/min via Patient connected to nasal cannula oxygen DISCHARGE LOCATION:  home   If you experience worsening of your admission symptoms, develop shortness of breath, life threatening emergency,  suicidal or homicidal thoughts you must seek medical attention immediately by calling 911 or calling your MD immediately  if symptoms less severe.  You Must read complete instructions/literature along with all the possible adverse reactions/side effects for all the Medicines you take and that have been prescribed to you. Take any new Medicines after you have completely understood and accpet all the possible adverse reactions/side effects.    Please note  You were cared for by a hospitalist during your hospital stay. If you have any questions about your discharge medications or the care you received while you were in the hospital after you are discharged, you can call the unit and asked to speak with the hospitalist on call if the hospitalist that took care of you is not available. Once you are discharged, your primary care physician will handle any further medical issues. Please note that NO REFILLS for any discharge medications will be authorized once you are discharged, as it is imperative that you return to your primary care physician (or establish a relationship with a primary care physician if you do not have one) for your aftercare needs so that they can reassess your need for medications and monitor your lab values.    On the day of Discharge:  VITAL SIGNS:  Blood pressure 134/80, pulse 74, temperature 98.4 F (36.9 C), temperature source Oral, resp. rate 18, height 5\' 9"  (1.753 m), weight 77 kg, SpO2 100 %. PHYSICAL EXAMINATION:  GENERAL:  65 y.o.-year-old patient lying in the bed with no acute distress.  EYES: Pupils equal, round, reactive to light and accommodation. No scleral icterus. Extraocular muscles intact.  HEENT: Head atraumatic, normocephalic. Oropharynx and nasopharynx clear.  NECK:  Supple, no jugular venous distention. No thyroid enlargement, no tenderness.  LUNGS: Normal breath sounds bilaterally, no wheezing, rales,rhonchi or crepitation. No use of accessory muscles of respiration.  CARDIOVASCULAR: S1, S2 normal. No murmurs, rubs, or gallops.  ABDOMEN: Soft, non-tender, non-distended. Bowel sounds present. No organomegaly or mass.  EXTREMITIES: No pedal edema, cyanosis, or clubbing.  NEUROLOGIC: Cranial nerves II through XII are intact. Muscle strength 5/5 in all extremities. Sensation intact. Gait not checked.  PSYCHIATRIC: The patient is alert and oriented x 3.  SKIN: No obvious rash, lesion, or ulcer.   DATA REVIEW:   CBC Recent Labs  Lab 02/22/21 0425  WBC 13.6*  HGB 13.5  HCT 40.6  PLT 250    Chemistries  Recent Labs  Lab 02/22/21 0425  NA 136  K 4.3  CL 96*  CO2 29  GLUCOSE 233*  BUN 19  CREATININE 0.80  CALCIUM 9.6     Outpatient follow-up  Follow-up Information    [facility]. Go on 03/01/2021.   Specialty: General Practice Why: 11:40am  Contact information: [address] [place] [address]        [phone], MD. Go in 2 weeks.   Specialty: Specialist Why: the patient didnt show up to his appointments so office cant make appointment Contact information: [address] [place] [phone]               30 Day Unplanned Readmission Risk Score   Flowsheet Row ED to Hosp-Admission (Discharged) from 02/19/2021 in [facility]  30 Day Unplanned Readmission Risk Score (%) 17.06 Filed at 02/22/2021 0801     This score is the patient's risk of an unplanned readmission within 30 days of being discharged (0 -  100%). The score is based on dignosis, age, lab data, medications, orders, and past utilization.   Low:  0-14.9   Medium: 15-21.9   High: 22-29.9   Extreme: 30 and above         Management plans discussed with the patient, family and they are in agreement.  CODE STATUS: Prior   TOTAL TIME TAKING CARE OF THIS PATIENT: 45 minutes.    Delfino Lovett M.D on 02/23/2021 at 4:21 PM  [facility]   CC: Primary care physician; [name]   Note: This dictation was prepared with Nurse, children's dictation along with smaller phrase technology. Any transcriptional errors that result from this process are unintentional.

## 2021-02-28 LAB — IGE: IgE (Immunoglobulin E), Serum: 521 IU/mL — ABNORMAL HIGH (ref 6–495)

## 2021-05-07 ENCOUNTER — Inpatient Hospital Stay
Admission: EM | Admit: 2021-05-07 | Discharge: 2021-05-13 | DRG: 190 | Disposition: A | Discharge status: Home / Self Care | Payer: Medicaid Other | Attending: Internal Medicine | Admitting: Internal Medicine

## 2021-05-07 ENCOUNTER — Emergency Department: Payer: Medicaid Other

## 2021-05-07 DIAGNOSIS — R030 Elevated blood-pressure reading, without diagnosis of hypertension: Secondary | ICD-10-CM | POA: Diagnosis present

## 2021-05-07 DIAGNOSIS — Z7951 Long term (current) use of inhaled steroids: Secondary | ICD-10-CM

## 2021-05-07 DIAGNOSIS — K219 Gastro-esophageal reflux disease without esophagitis: Secondary | ICD-10-CM | POA: Diagnosis present

## 2021-05-07 DIAGNOSIS — Z79899 Other long term (current) drug therapy: Secondary | ICD-10-CM | POA: Diagnosis not present

## 2021-05-07 DIAGNOSIS — R0603 Acute respiratory distress: Secondary | ICD-10-CM

## 2021-05-07 DIAGNOSIS — N400 Enlarged prostate without lower urinary tract symptoms: Secondary | ICD-10-CM | POA: Diagnosis present

## 2021-05-07 DIAGNOSIS — Z833 Family history of diabetes mellitus: Secondary | ICD-10-CM

## 2021-05-07 DIAGNOSIS — J9611 Chronic respiratory failure with hypoxia: Secondary | ICD-10-CM | POA: Diagnosis not present

## 2021-05-07 DIAGNOSIS — E876 Hypokalemia: Secondary | ICD-10-CM | POA: Diagnosis not present

## 2021-05-07 DIAGNOSIS — J9601 Acute respiratory failure with hypoxia: Secondary | ICD-10-CM

## 2021-05-07 DIAGNOSIS — H548 Legal blindness, as defined in USA: Secondary | ICD-10-CM | POA: Diagnosis present

## 2021-05-07 DIAGNOSIS — Z87891 Personal history of nicotine dependence: Secondary | ICD-10-CM | POA: Diagnosis not present

## 2021-05-07 DIAGNOSIS — F419 Anxiety disorder, unspecified: Secondary | ICD-10-CM | POA: Diagnosis not present

## 2021-05-07 DIAGNOSIS — J441 Chronic obstructive pulmonary disease with (acute) exacerbation: Secondary | ICD-10-CM | POA: Diagnosis present

## 2021-05-07 DIAGNOSIS — R059 Cough, unspecified: Secondary | ICD-10-CM

## 2021-05-07 DIAGNOSIS — E1165 Type 2 diabetes mellitus with hyperglycemia: Secondary | ICD-10-CM | POA: Diagnosis present

## 2021-05-07 DIAGNOSIS — Z20822 Contact with and (suspected) exposure to covid-19: Secondary | ICD-10-CM | POA: Diagnosis present

## 2021-05-07 DIAGNOSIS — F32A Depression, unspecified: Secondary | ICD-10-CM | POA: Diagnosis present

## 2021-05-07 DIAGNOSIS — J9621 Acute and chronic respiratory failure with hypoxia: Secondary | ICD-10-CM

## 2021-05-07 DIAGNOSIS — H409 Unspecified glaucoma: Secondary | ICD-10-CM | POA: Diagnosis not present

## 2021-05-07 DIAGNOSIS — J45901 Unspecified asthma with (acute) exacerbation: Secondary | ICD-10-CM | POA: Diagnosis present

## 2021-05-07 LAB — MAGNESIUM: Magnesium: 1.8 mg/dL (ref 1.7–2.4)

## 2021-05-07 LAB — CBC
HCT: 40.1 % (ref 39.0–52.0)
Hemoglobin: 13.5 g/dL (ref 13.0–17.0)
MCH: 29.9 pg (ref 26.0–34.0)
MCHC: 33.7 g/dL (ref 30.0–36.0)
MCV: 88.7 fL (ref 80.0–100.0)
Platelets: 203 10*3/uL (ref 150–400)
RBC: 4.52 MIL/uL (ref 4.22–5.81)
RDW: 13.4 % (ref 11.5–15.5)
WBC: 10 10*3/uL (ref 4.0–10.5)
nRBC: 0 % (ref 0.0–0.2)

## 2021-05-07 LAB — COMPREHENSIVE METABOLIC PANEL
ALT: 26 U/L (ref 0–44)
AST: 27 U/L (ref 15–41)
Albumin: 4.1 g/dL (ref 3.5–5.0)
Alkaline Phosphatase: 74 U/L (ref 38–126)
Anion gap: 8 (ref 5–15)
BUN: 11 mg/dL (ref 8–23)
CO2: 28 mmol/L (ref 22–32)
Calcium: 8.7 mg/dL — ABNORMAL LOW (ref 8.9–10.3)
Chloride: 104 mmol/L (ref 98–111)
Creatinine, Ser: 0.86 mg/dL (ref 0.61–1.24)
GFR, Estimated: >60 mL/min (ref >60)
Glucose, Bld: 136 mg/dL — ABNORMAL HIGH (ref 70–99)
Potassium: 3.4 mmol/L — ABNORMAL LOW (ref 3.5–5.1)
Sodium: 140 mmol/L (ref 135–145)
Total Bilirubin: 0.6 mg/dL (ref 0.3–1.2)
Total Protein: 7.1 g/dL (ref 6.5–8.1)

## 2021-05-07 LAB — CBC WITH DIFFERENTIAL/PLATELET
Abs Immature Granulocytes: 0.01 10*3/uL (ref 0.00–0.07)
Basophils Absolute: 0.1 10*3/uL (ref 0.0–0.1)
Basophils Relative: 1 %
Eosinophils Absolute: 0.3 10*3/uL (ref 0.0–0.5)
Eosinophils Relative: 4 %
HCT: 43.6 % (ref 39.0–52.0)
Hemoglobin: 14.3 g/dL (ref 13.0–17.0)
Immature Granulocytes: 0 %
Lymphocytes Relative: 48 %
Lymphs Abs: 3.8 10*3/uL (ref 0.7–4.0)
MCH: 29.9 pg (ref 26.0–34.0)
MCHC: 32.8 g/dL (ref 30.0–36.0)
MCV: 91 fL (ref 80.0–100.0)
Monocytes Absolute: 0.6 10*3/uL (ref 0.1–1.0)
Monocytes Relative: 7 %
Neutro Abs: 3.1 10*3/uL (ref 1.7–7.7)
Neutrophils Relative %: 40 %
Platelets: 220 10*3/uL (ref 150–400)
RBC: 4.79 MIL/uL (ref 4.22–5.81)
RDW: 13.3 % (ref 11.5–15.5)
WBC: 7.9 10*3/uL (ref 4.0–10.5)
nRBC: 0 % (ref 0.0–0.2)

## 2021-05-07 LAB — TROPONIN I (HIGH SENSITIVITY)
Troponin I (High Sensitivity): 11 ng/L (ref <18)
Troponin I (High Sensitivity): 8 ng/L (ref <18)

## 2021-05-07 LAB — RESP PANEL BY RT-PCR (FLU A&B, COVID) ARPGX2
Influenza A by PCR: NEGATIVE
Influenza B by PCR: NEGATIVE
SARS Coronavirus 2 by RT PCR: NEGATIVE

## 2021-05-07 LAB — BRAIN NATRIURETIC PEPTIDE: B Natriuretic Peptide: 17.7 pg/mL (ref 0.0–100.0)

## 2021-05-07 MED ORDER — SODIUM CHLORIDE 0.9 % IV SOLN
1.0000 g | INTRAVENOUS | Status: DC
  Administered 2021-05-07: 1 g via INTRAVENOUS
  Filled 2021-05-07: qty 10

## 2021-05-07 MED ORDER — TAMSULOSIN HCL 0.4 MG PO CAPS
0.4000 mg | ORAL_CAPSULE | Freq: Every day | ORAL | Status: DC
  Administered 2021-05-07 – 2021-05-13 (×7): 0.4 mg via ORAL
  Filled 2021-05-07 (×7): qty 1

## 2021-05-07 MED ORDER — IPRATROPIUM-ALBUTEROL 0.5-2.5 (3) MG/3ML IN SOLN
3.0000 mL | Freq: Once | RESPIRATORY_TRACT | Status: AC
Start: 2021-05-07 — End: 2021-05-07
  Administered 2021-05-07: 3 mL via RESPIRATORY_TRACT
  Filled 2021-05-07: qty 9

## 2021-05-07 MED ORDER — SODIUM CHLORIDE 0.9 % IV SOLN
500.0000 mg | INTRAVENOUS | Status: DC
  Administered 2021-05-07 – 2021-05-09 (×3): 500 mg via INTRAVENOUS
  Filled 2021-05-07 (×3): qty 500

## 2021-05-07 MED ORDER — MOMETASONE FURO-FORMOTEROL FUM 200-5 MCG/ACT IN AERO
2.0000 | INHALATION_SPRAY | Freq: Two times a day (BID) | RESPIRATORY_TRACT | Status: DC
  Administered 2021-05-07 – 2021-05-13 (×13): 2 via RESPIRATORY_TRACT
  Filled 2021-05-07: qty 8.8

## 2021-05-07 MED ORDER — PREDNISONE 20 MG PO TABS: 40.0000 mg | ORAL_TABLET | Freq: Every day | ORAL | Status: DC

## 2021-05-07 MED ORDER — BRIMONIDINE TARTRATE 0.2 % OP SOLN
1.0000 [drp] | Freq: Three times a day (TID) | OPHTHALMIC | Status: DC
  Administered 2021-05-07 – 2021-05-13 (×16): 1 [drp] via OPHTHALMIC
  Filled 2021-05-07 (×2): qty 5

## 2021-05-07 MED ORDER — MAGNESIUM SULFATE 2 GM/50ML IV SOLN
INTRAVENOUS | Status: AC
  Administered 2021-05-07: 2 g
  Filled 2021-05-07: qty 50

## 2021-05-07 MED ORDER — GUAIFENESIN ER 600 MG PO TB12
600.0000 mg | ORAL_TABLET | Freq: Two times a day (BID) | ORAL | Status: DC
  Administered 2021-05-07 – 2021-05-13 (×14): 600 mg via ORAL
  Filled 2021-05-07 (×14): qty 1

## 2021-05-07 MED ORDER — HYDROCOD POLST-CPM POLST ER 10-8 MG/5ML PO SUER
5.0000 mL | Freq: Two times a day (BID) | ORAL | Status: DC | PRN
  Administered 2021-05-08 – 2021-05-10 (×4): 5 mL via ORAL
  Filled 2021-05-07 (×4): qty 5

## 2021-05-07 MED ORDER — MONTELUKAST SODIUM 10 MG PO TABS
10.0000 mg | ORAL_TABLET | Freq: Every day | ORAL | Status: DC
  Administered 2021-05-07 – 2021-05-13 (×7): 10 mg via ORAL
  Filled 2021-05-07 (×7): qty 1

## 2021-05-07 MED ORDER — CALCIUM CARBONATE-VITAMIN D 500-200 MG-UNIT PO TABS
1.0000 | ORAL_TABLET | Freq: Three times a day (TID) | ORAL | Status: DC
  Administered 2021-05-07 – 2021-05-13 (×19): 1 via ORAL
  Filled 2021-05-07 (×22): qty 1

## 2021-05-07 MED ORDER — CALCIUM GLUCONATE-NACL 1-0.675 GM/50ML-% IV SOLN
1.0000 g | Freq: Once | INTRAVENOUS | Status: AC
  Administered 2021-05-07: 1000 mg via INTRAVENOUS
  Filled 2021-05-07: qty 50

## 2021-05-07 MED ORDER — METHYLPREDNISOLONE SODIUM SUCC 40 MG IJ SOLR
40.0000 mg | Freq: Four times a day (QID) | INTRAMUSCULAR | Status: DC
  Administered 2021-05-07 (×2): 40 mg via INTRAVENOUS
  Filled 2021-05-07 (×2): qty 1

## 2021-05-07 MED ORDER — TRAZODONE HCL 50 MG PO TABS
25.0000 mg | ORAL_TABLET | Freq: Every evening | ORAL | Status: DC | PRN
  Administered 2021-05-08 – 2021-05-12 (×4): 25 mg via ORAL
  Filled 2021-05-07 (×4): qty 1

## 2021-05-07 MED ORDER — ENOXAPARIN SODIUM 40 MG/0.4ML IJ SOSY
40.0000 mg | PREFILLED_SYRINGE | INTRAMUSCULAR | Status: DC
  Administered 2021-05-08 – 2021-05-12 (×5): 40 mg via SUBCUTANEOUS
  Filled 2021-05-07 (×6): qty 0.4

## 2021-05-07 MED ORDER — IPRATROPIUM-ALBUTEROL 0.5-2.5 (3) MG/3ML IN SOLN: 3.0000 mL | Freq: Four times a day (QID) | RESPIRATORY_TRACT | Status: DC | PRN

## 2021-05-07 MED ORDER — ONDANSETRON HCL 4 MG/2ML IJ SOLN: 4.0000 mg | Freq: Four times a day (QID) | INTRAMUSCULAR | Status: DC | PRN

## 2021-05-07 MED ORDER — MAGNESIUM HYDROXIDE 400 MG/5ML PO SUSP: 30.0000 mL | Freq: Every day | ORAL | Status: DC | PRN

## 2021-05-07 MED ORDER — METHYLPREDNISOLONE SODIUM SUCC 40 MG IJ SOLR
40.0000 mg | Freq: Four times a day (QID) | INTRAMUSCULAR | Status: DC
  Administered 2021-05-07 – 2021-05-09 (×7): 40 mg via INTRAVENOUS
  Filled 2021-05-07 (×7): qty 1

## 2021-05-07 MED ORDER — PANTOPRAZOLE SODIUM 20 MG PO TBEC
20.0000 mg | DELAYED_RELEASE_TABLET | Freq: Every day | ORAL | Status: DC
  Administered 2021-05-07 – 2021-05-13 (×7): 20 mg via ORAL
  Filled 2021-05-07 (×7): qty 1

## 2021-05-07 MED ORDER — MAGNESIUM SULFATE 2 GM/50ML IV SOLN: 2.0000 g | Freq: Once | INTRAVENOUS | Status: DC

## 2021-05-07 MED ORDER — ENSURE PO LIQD
237.0000 mL | Freq: Two times a day (BID) | ORAL | Status: DC
  Administered 2021-05-07 – 2021-05-12 (×11): 237 mL via ORAL
  Filled 2021-05-07: qty 237

## 2021-05-07 MED ORDER — IPRATROPIUM-ALBUTEROL 0.5-2.5 (3) MG/3ML IN SOLN
3.0000 mL | RESPIRATORY_TRACT | Status: DC
  Administered 2021-05-07 – 2021-05-12 (×30): 3 mL via RESPIRATORY_TRACT
  Filled 2021-05-07 (×27): qty 3

## 2021-05-07 MED ORDER — ACETAMINOPHEN 325 MG PO TABS
650.0000 mg | ORAL_TABLET | Freq: Four times a day (QID) | ORAL | Status: DC | PRN
  Filled 2021-05-07: qty 2

## 2021-05-07 MED ORDER — ENOXAPARIN SODIUM 40 MG/0.4ML IJ SOSY: 40.0000 mg | PREFILLED_SYRINGE | INTRAMUSCULAR | Status: DC

## 2021-05-07 MED ORDER — SODIUM CHLORIDE 0.9 % IV SOLN: INTRAVENOUS | Status: DC

## 2021-05-07 MED ORDER — IPRATROPIUM-ALBUTEROL 0.5-2.5 (3) MG/3ML IN SOLN
3.0000 mL | RESPIRATORY_TRACT | Status: DC | PRN
  Administered 2021-05-07: 3 mL via RESPIRATORY_TRACT
  Filled 2021-05-07: qty 3

## 2021-05-07 MED ORDER — ACETAMINOPHEN 650 MG RE SUPP: 650.0000 mg | Freq: Four times a day (QID) | RECTAL | Status: DC | PRN

## 2021-05-07 MED ORDER — BRINZOLAMIDE 1 % OP SUSP
1.0000 [drp] | Freq: Three times a day (TID) | OPHTHALMIC | Status: DC
  Administered 2021-05-07 – 2021-05-13 (×16): 1 [drp] via OPHTHALMIC
  Filled 2021-05-07 (×2): qty 10

## 2021-05-07 MED ORDER — ONDANSETRON HCL 4 MG PO TABS: 4.0000 mg | ORAL_TABLET | Freq: Four times a day (QID) | ORAL | Status: DC | PRN

## 2021-05-07 NOTE — H&P (Signed)
Since     [place]   PATIENT NAME: Bryan Kelley    MR#:  [id number]  DATE OF BIRTH:  25-Jul-1956  DATE OF ADMISSION:  05/07/2021  PRIMARY CARE PHYSICIAN: [name]   Patient is coming from: Home  REQUESTING/REFERRING PHYSICIAN: Chiquita Loth, MD  CHIEF COMPLAINT:   Chief Complaint  Patient presents with  . Shortness of Breath    HISTORY OF PRESENT ILLNESS:  Bryan Kelley is a 65 y.o. African-American male with medical history significant for asthma and COPD, depression and GERD, who presented to the emergency room with acute onset of worsening dyspnea with associated cough productive of brownish sputum last night, with significant respiratory distress requiring BiPAP in the ER.  Denies any chest pain or palpitations.  No fever or chills.  No nausea or vomiting or abdominal pain.  ED Course: Upon presentation to the emergency room blood pressure was 146/98 and respiratory 27 and pulse symmetry was 100% on 32% FiO2 on BiPAP.  CMP was remarkable for hypokalemia 3.4 and BNP was 17.7 with high-sensitivity troponin 11.  CBC was within normal.  Influenza antigens and COVID-19 PCR came back negative. EKG as reviewed by me : EKG showed normal sinus rhythm with a rate of 89 with PACs and poor R wave progression, right axis deviation and borderline repolarization abnormality. Imaging: Chest x-ray showed no acute cardiopulmonary disease  The patient was given 2 DuoNeb's and IV Solu-Medrol by EMS.  In the ER he received a third DuoNeb and IV magnesium sulfate and was placed on BiPAP as mentioned above with some improvement.  He will be admitted to a progressive unit bed for further evaluation and management.  Past Medical History:  Diagnosis Date  . Asthma   . COPD (chronic obstructive pulmonary disease) (HCC)   . Depression   . GERD (gastroesophageal reflux disease)   . Vision loss, left eye     PAST SURGICAL HISTORY:   Past Surgical History:  Procedure  Laterality Date  . EYE SURGERY  2009, about [id number]   steel in left eye on the job, legally blind     SOCIAL HISTORY:   Social History   Tobacco Use  . Smoking status: Former    Packs/day: 0.50    Years: 40.00    Pack years: 20.00    Types: Cigarettes    Quit date: 2020    Years since quitting: 2.5  . Smokeless tobacco: Never  Substance Use Topics  . Alcohol use: Yes    FAMILY HISTORY:   Family History  Problem Relation Age of Onset  . Diabetes Mother   . Hypertension Mother   . Stroke Mother   . Asthma Father   . Diabetes Father   . Diabetes Sister   . Hypertension Sister   . Diabetes Brother   . Hypertension Brother     DRUG ALLERGIES:  No Known Allergies  REVIEW OF SYSTEMS:   ROS As per history of present illness. All pertinent systems were reviewed above. Constitutional, HEENT, cardiovascular, respiratory, GI, GU, musculoskeletal, neuro, psychiatric, endocrine, integumentary and hematologic systems were reviewed and are otherwise negative/unremarkable except for positive findings mentioned above in the HPI.   MEDICATIONS AT HOME:   Prior to Admission medications   Medication Sig Start Date End Date Taking? Authorizing Provider  albuterol (PROVENTIL) (2.5 MG/3ML) 0.083% nebulizer solution Take 2.5 mg by nebulization every 4 (four) hours as needed for wheezing. 06/06/20  Yes [provider]  albuterol (VENTOLIN  HFA) 108 (90 Base) MCG/ACT inhaler Inhale 2 puffs into the lungs every 6 (six) hours as needed for wheezing or shortness of breath. 11/30/19  Yes Don Perking, [place], MD  DULERA 200-5 MCG/ACT AERO Inhale 2 puffs into the lungs 2 (two) times daily. 02/22/21  Yes Delfino Lovett, MD  Ensure (ENSURE) Take 237 mLs by mouth 2 (two) times daily between meals.   Yes [provider]  erythromycin ophthalmic ointment Place 1 application into the left eye in the morning and at bedtime. 03/22/21  Yes [provider]  montelukast (SINGULAIR) 10  MG tablet Take 1 tablet (10 mg total) by mouth daily. 02/22/21 07/03/21 Yes Delfino Lovett, MD  pantoprazole (PROTONIX) 20 MG tablet Take 1 tablet (20 mg total) by mouth daily. 11/24/20 06/29/21 Yes Lynn Ito, MD  SIMBRINZA 1-0.2 % SUSP Place 1 drop into the left eye in the morning and at bedtime. 03/22/21  Yes [provider]  tamsulosin (FLOMAX) 0.4 MG CAPS capsule Take 0.4 mg by mouth daily. 11/29/20  Yes [provider]  Multiple Vitamin (MULTIVITAMIN WITH MINERALS) TABS tablet Take 1 tablet by mouth daily. Patient not taking: Reported on 05/07/2021    [provider]  predniSONE (STERAPRED UNI-PAK 21 TAB) 10 MG (21) TBPK tablet Start 60 mg po daily, taper 10 mg daily until done Patient not taking: Reported on 05/07/2021 02/22/21   Delfino Lovett, MD      VITAL SIGNS:  Blood pressure (!) 144/82, pulse 99, temperature 97.8 F (36.6 C), temperature source Oral, resp. rate (!) 22, height 5\' 11"  (1.803 m), SpO2 98 %.  PHYSICAL EXAMINATION:  Physical Exam  GENERAL:  66 y.o.-year-old African-American male patient lying in the bed with mild respiratory distress on BiPAP. EYES: Pupils equal, round, reactive to light and accommodation. No scleral icterus. Extraocular muscles intact.  HEENT: Head atraumatic, normocephalic. Oropharynx and nasopharynx: BiPAP mask in place.77  NECK:  Supple, no jugular venous distention. No thyroid enlargement, no tenderness.  LUNGS: Diffuse expiratory wheezes with tight expiratory airflow and harsh vesicular breathing. CARDIOVASCULAR: Regular rate and rhythm, S1, S2 normal. No murmurs, rubs, or gallops.  ABDOMEN: Soft, nondistended, nontender. Bowel sounds present. No organomegaly or mass.  EXTREMITIES: No pedal edema, cyanosis, or clubbing.  NEUROLOGIC: Cranial nerves II through XII are intact. Muscle strength 5/5 in all extremities. Sensation intact. Gait not checked.  PSYCHIATRIC: The patient is alert and oriented x 3.  Normal affect and good eye  contact. SKIN: No obvious rash, lesion, or ulcer.   LABORATORY PANEL:   CBC Recent Labs  Lab 05/07/21 0106  WBC 7.9  HGB 14.3  HCT 43.6  PLT 220   ------------------------------------------------------------------------------------------------------------------  Chemistries  Recent Labs  Lab 05/07/21 0106  NA 140  K 3.4*  CL 104  CO2 28  GLUCOSE 136*  BUN 11  CREATININE 0.86  CALCIUM 8.7*  MG 1.8  AST 27  ALT 26  ALKPHOS 74  BILITOT 0.6   ------------------------------------------------------------------------------------------------------------------  Cardiac Enzymes No results for input(s): TROPONINI in the last 168 hours. ------------------------------------------------------------------------------------------------------------------  RADIOLOGY:  DG Chest Port 1 View  Result Date: 05/07/2021 CLINICAL DATA:  Shortness of breath. EXAM: PORTABLE CHEST 1 VIEW COMPARISON:  02/19/2021 FINDINGS: The heart size and mediastinal contours are within normal limits. Both lungs are clear. The visualized skeletal structures are unremarkable. IMPRESSION: No active disease. Electronically Signed   By: 02/21/2021 M.D.   On: 05/07/2021 01:36      IMPRESSION AND PLAN:  Active Problems:  COPD exacerbation (HCC)  1.  COPD and asthma acute exacerbation likely secondary to acute bronchitis with subsequent acute respiratory failure with hypoxia, requiring BiPAP.. - The patient will be admitted to a progressive unit bed. - We will continue steroid therapy with IV Solu-Medrol. - We will continue bronchodilator therapy with DuoNebs. - We will add IV antibiotic therapy with Rocephin and Zithromax given severity of COPD and asthma exacerbation. - Mucolytic's will be provided. - We will follow blood and sputum cultures. - We will continue Singulair and hold off Dulera.  2.  Hypokalemia. - Potassium will be replaced and magnesium will be optimized.  3.  GERD without  esophagitis. - We will continue PPI therapy.  4.  BPH. - We will continue Flomax.  DVT prophylaxis: Lovenox. Code Status: full code. Family Communication:  The plan of care was discussed in details with the patient (and family). I answered all questions. The patient agreed to proceed with the above mentioned plan. Further management will depend upon hospital course. Disposition Plan: Back to previous home environment Consults called: none. All the records are reviewed and case discussed with ED provider.  Status is: Inpatient  Remains inpatient appropriate because:Ongoing diagnostic testing needed not appropriate for outpatient work up, Unsafe d/c plan, IV treatments appropriate due to intensity of illness or inability to take PO, and Inpatient level of care appropriate due to severity of illness  Dispo: The patient is from: Home              Anticipated d/c is to: Home              Patient currently is not medically stable to d/c.   Difficult to place patient No  TOTAL TIME TAKING CARE OF THIS PATIENT: 55 minutes.    Hannah Beat M.D on 05/07/2021 at 3:36 AM  [facility]   From 7 PM-7 AM, contact night-coverage [email or web address]  CC: Primary care physician; [name]

## 2021-05-07 NOTE — ED Notes (Signed)
Pt was given 2 neb  Treatments and solu-medrol in  By ems

## 2021-05-07 NOTE — ED Notes (Signed)
Dr. Arville Care Notified Via message about Pts Ca+

## 2021-05-07 NOTE — ED Notes (Signed)
Pt given orange juice.

## 2021-05-07 NOTE — Progress Notes (Signed)
Pt arrived to floor via wheelchair. Alert and oriented x 4. 2L [place]. Pt oriented to room and call light.

## 2021-05-07 NOTE — ED Provider Notes (Signed)
[facility] Emergency Department Provider Note   ____________________________________________   Event Date/Time   First MD Initiated Contact with Patient 05/07/21 0102     (approximate)  I have reviewed the triage vital signs and the nursing notes.   HISTORY  Chief Complaint Shortness of Breath  Level V caveat: Limited by respiratory distress  HPI Bryan Kelley is a 65 y.o. male brought to the ED via EMS from home with a chief complaint of respiratory distress.  Patient with a history of asthma, COPD on 2 L nasal cannula oxygen chronically.  Reports increasing shortness of breath tonight associated with chest tightness.  EMS gave 125 mg IV Solu-Medrol, 2 DuoNeb's en route to the ED.  Patient arrives to the treatment room on CPAP.  Denies fever, abdominal pain, nausea, vomiting or diarrhea.     Past Medical History:  Diagnosis Date   Asthma    COPD (chronic obstructive pulmonary disease) (HCC)    Depression    GERD (gastroesophageal reflux disease)    Vision loss, left eye     Patient Active Problem List   Diagnosis Date Noted   Malnutrition of moderate degree 02/20/2021   Chronic respiratory failure (HCC) 02/19/2021   Acute respiratory failure (HCC) 10/06/2020   Tobacco abuse 07/25/2020   HTN (hypertension) 07/25/2020   Acute on chronic respiratory failure with hypercapnia (HCC) 12/06/2019   Nausea vomiting and diarrhea 12/06/2019   Acute respiratory disease due to COVID-19 virus 12/06/2019   COPD (chronic obstructive pulmonary disease) (HCC) 10/30/2019   Hyperglycemia    COPD with acute exacerbation (HCC) 10/29/2019   COPD exacerbation (HCC) 10/29/2019   Epigastric abdominal pain 08/12/2012   Alcohol abuse 03/14/2012   Elevated BP 12/07/2011   ED (erectile dysfunction) 07/28/2011   NICOTINE ADDICTION 10/29/2009   Acute bronchitis 05/07/2009   ERECTILE DYSFUNCTION, NON-ORGANIC 05/04/2009   ANKLE PAIN, RIGHT 10/04/2008   FATIGUE  10/04/2008   ANXIETY 02/17/2008   ASTHMA 02/17/2008   GERD (gastroesophageal reflux disease) 02/17/2008   BPH (benign prostatic hyperplasia) 02/17/2008    Past Surgical History:  Procedure Laterality Date   EYE SURGERY  2009, about [id number]   steel in left eye on the job, legally blind     Prior to Admission medications   Medication Sig Start Date End Date Taking? Authorizing Provider  albuterol (PROVENTIL) (2.5 MG/3ML) 0.083% nebulizer solution Take 2.5 mg by nebulization every 4 (four) hours as needed for wheezing. 06/06/20  Yes [provider]  albuterol (VENTOLIN HFA) 108 (90 Base) MCG/ACT inhaler Inhale 2 puffs into the lungs every 6 (six) hours as needed for wheezing or shortness of breath. 11/30/19  Yes Don Perking, [place], MD  DULERA 200-5 MCG/ACT AERO Inhale 2 puffs into the lungs 2 (two) times daily. 02/22/21  Yes Delfino Lovett, MD  Ensure (ENSURE) Take 237 mLs by mouth 2 (two) times daily between meals.   Yes [provider]  erythromycin ophthalmic ointment Place 1 application into the left eye in the morning and at bedtime. 03/22/21  Yes [provider]  montelukast (SINGULAIR) 10 MG tablet Take 1 tablet (10 mg total) by mouth daily. 02/22/21 07/03/21 Yes Delfino Lovett, MD  pantoprazole (PROTONIX) 20 MG tablet Take 1 tablet (20 mg total) by mouth daily. 11/24/20 06/29/21 Yes Lynn Ito, MD  SIMBRINZA 1-0.2 % SUSP Place 1 drop into the left eye in the morning and at bedtime. 03/22/21  Yes [provider]  tamsulosin (FLOMAX) 0.4 MG CAPS capsule Take 0.4  mg by mouth daily. 11/29/20  Yes [provider]  Multiple Vitamin (MULTIVITAMIN WITH MINERALS) TABS tablet Take 1 tablet by mouth daily. Patient not taking: Reported on 05/07/2021    [provider]  predniSONE (STERAPRED UNI-PAK 21 TAB) 10 MG (21) TBPK tablet Start 60 mg po daily, taper 10 mg daily until done Patient not taking: Reported on 05/07/2021 02/22/21   Delfino Lovett, MD     Allergies Patient has no known allergies.  Family History  Problem Relation Age of Onset   Diabetes Mother    Hypertension Mother    Stroke Mother    Asthma Father    Diabetes Father    Diabetes Sister    Hypertension Sister    Diabetes Brother    Hypertension Brother     Social History Social History   Tobacco Use   Smoking status: Former    Packs/day: 0.50    Years: 40.00    Pack years: 20.00    Types: Cigarettes    Quit date: 2020    Years since quitting: 2.5   Smokeless tobacco: Never  Vaping Use   Vaping Use: Never used  Substance Use Topics   Alcohol use: Yes   Drug use: No    Review of Systems  Constitutional: No fever/chills Eyes: No visual changes. ENT: No sore throat. Cardiovascular: Positive for chest pain. Respiratory: Positive for shortness of breath. Gastrointestinal: No abdominal pain.  No nausea, no vomiting.  No diarrhea.  No constipation. Genitourinary: Negative for dysuria. Musculoskeletal: Negative for back pain. Skin: Negative for rash. Neurological: Negative for headaches, focal weakness or numbness.   ____________________________________________   PHYSICAL EXAM:  VITAL SIGNS: ED Triage Vitals  Enc Vitals Group     BP      Pulse      Resp      Temp      Temp src      SpO2      Weight      Height      Head Circumference      Peak Flow      Pain Score      Pain Loc      Pain Edu?      Excl. in GC?     Constitutional: Alert and oriented.  Ill appearing and in moderate acute distress. Eyes: Conjunctivae are normal. PERRL. EOMI. Head: Atraumatic. Nose: No congestion/rhinnorhea. Mouth/Throat: Mucous membranes are moist.   Neck: No stridor.   Cardiovascular: Normal rate, regular rhythm. Grossly normal heart sounds.  Good peripheral circulation. Respiratory: Increased respiratory effort.  Retractions.  Tripoding.  Lungs with diminished aeration. Gastrointestinal: Soft and nontender. No distention. No abdominal  bruits. No CVA tenderness. Musculoskeletal: No lower extremity tenderness nor edema.  No joint effusions. Neurologic:  Normal speech and language. No gross focal neurologic deficits are appreciated.  Skin:  Skin is warm, dry and intact. No rash noted. Psychiatric: Mood and affect are normal. Speech and behavior are normal.  ____________________________________________   LABS (all labs ordered are listed, but only abnormal results are displayed)  Labs Reviewed  COMPREHENSIVE METABOLIC PANEL - Abnormal; Notable for the following components:      Result Value   Potassium 3.4 (*)    Glucose, Bld 136 (*)    Calcium 8.7 (*)    All other components within normal limits  RESP PANEL BY RT-PCR (FLU A&B, COVID) ARPGX2  CBC WITH DIFFERENTIAL/PLATELET  BRAIN NATRIURETIC PEPTIDE  MAGNESIUM  TROPONIN I (HIGH SENSITIVITY)  TROPONIN  I (HIGH SENSITIVITY)   ____________________________________________  EKG  ED ECG REPORT I, Dimitriy Carreras J, the attending physician, personally viewed and interpreted this ECG.   Date: 05/07/2021  EKG Time: 0108  Rate: 89  Rhythm: normal sinus rhythm  Axis: Normal  Intervals:none  ST&T Change: Nonspecific  ____________________________________________  RADIOLOGY I, Cliff Damiani J, personally viewed and evaluated these images (plain radiographs) as part of my medical decision making, as well as reviewing the written report by the radiologist.  ED MD interpretation: No acute cardiopulmonary process  Official radiology report(s): DG Chest Port 1 View  Result Date: 05/07/2021 CLINICAL DATA:  Shortness of breath. EXAM: PORTABLE CHEST 1 VIEW COMPARISON:  02/19/2021 FINDINGS: The heart size and mediastinal contours are within normal limits. Both lungs are clear. The visualized skeletal structures are unremarkable. IMPRESSION: No active disease. Electronically Signed   By: Burman Nieves M.D.   On: 05/07/2021 01:36     ____________________________________________   PROCEDURES  Procedure(s) performed (including Critical Care):  .1-3 Lead EKG Interpretation  Date/Time: 05/07/2021 1:10 AM Performed by: Irean Hong, MD Authorized by: Irean Hong, MD    CRITICAL CARE Performed by: Irean Hong   Total critical care time: 45 minutes  Critical care time was exclusive of separately billable procedures and treating other patients.  Critical care was necessary to treat or prevent imminent or life-threatening deterioration.  Critical care was time spent personally by me on the following activities: development of treatment plan with patient and/or surrogate as well as nursing, discussions with consultants, evaluation of patient's response to treatment, examination of patient, obtaining history from patient or surrogate, ordering and performing treatments and interventions, ordering and review of laboratory studies, ordering and review of radiographic studies, pulse oximetry and re-evaluation of patient's condition.  ____________________________________________   INITIAL IMPRESSION / ASSESSMENT AND PLAN / ED COURSE  As part of my medical decision making, I reviewed the following data within the electronic MEDICAL RECORD NUMBER Nursing notes reviewed and incorporated, Labs reviewed, EKG interpreted, Old chart reviewed, Radiograph reviewed, Discussed with admitting physician, and Notes from prior ED visits     65 year old male with COPD presenting in respiratory distress. Differential includes, but is not limited to, viral syndrome, bronchitis including COPD exacerbation, pneumonia, reactive airway disease including asthma, CHF including exacerbation with or without pulmonary/interstitial edema, pneumothorax, ACS, thoracic trauma, and pulmonary embolism.   Patient switched to BiPAP upon his arrival to the treatment room.  Will administer another DuoNeb, 2g IV magnesium.  Obtain cardiac work-up, chest x-ray,  COVID swab.  Anticipate hospitalization.  Clinical Course as of 05/07/21 0318  Tue May 07, 2021  0216 Improved on BiPAP.  Resting in no acute distress.  Will discuss with hospitalist services for admission. [JS]    Clinical Course User Index [JS] Irean Hong, MD     ____________________________________________   FINAL CLINICAL IMPRESSION(S) / ED DIAGNOSES  Final diagnoses:  COPD exacerbation [facility])  Respiratory distress     ED Discharge Orders     None        Note:  This document was prepared using Dragon voice recognition software and may include unintentional dictation errors.    Irean Hong, MD 05/07/21 [phone]

## 2021-05-07 NOTE — ED Notes (Signed)
Pt resting comfortably at this time. NAD noted. Pt currently on BiPAP at this time. Pt denies any further needs at this time. Call bell in reach.

## 2021-05-07 NOTE — ED Triage Notes (Signed)
EMS WAS CALLED FOR A MALE PT WITH SOB, PT DOES HAVE COPD,   PT AIRWAY WAS PATENT , RAPID, LABORED BREATHING  ,. VITALS AS NOTED IN CHART

## 2021-05-07 NOTE — ED Notes (Signed)
Dr. Glennon Mac

## 2021-05-07 NOTE — Progress Notes (Signed)
PROGRESS NOTE    HPI was taken from Dr. Arville Care: Bryan Kelley is a 65 y.o. African-American male with medical history significant for asthma and COPD, depression and GERD, who presented to the emergency room with acute onset of worsening dyspnea with associated cough productive of brownish sputum last night, with significant respiratory distress requiring BiPAP in the ER.  Denies any chest pain or palpitations.  No fever or chills.  No nausea or vomiting or abdominal pain.   ED Course: Upon presentation to the emergency room blood pressure was 146/98 and respiratory 27 and pulse symmetry was 100% on 32% FiO2 on BiPAP.  CMP was remarkable for hypokalemia 3.4 and BNP was 17.7 with high-sensitivity troponin 11.  CBC was within normal.  Influenza antigens and COVID-19 PCR came back negative. EKG as reviewed by me : EKG showed normal sinus rhythm with a rate of 89 with PACs and poor R wave progression, right axis deviation and borderline repolarization abnormality. Imaging: Chest x-ray showed no acute cardiopulmonary disease  The patient was given 2 DuoNeb's and IV Solu-Medrol by EMS.  In the ER he received a third DuoNeb and IV magnesium sulfate and was placed on BiPAP as mentioned above with some improvement.  He will be admitted to a progressive unit bed for further evaluation and management.  Bryan Kelley  WCH:[id number] DOB: [birth year] DOA: 05/07/2021 [facility]CP: [name]   Assessment & Plan:   Active Problems:   COPD exacerbation (HCC)   COPD & asthma exacerbation: significant wheezing extensively throughout both lungs. Unknown stage or severity of asthma. Continue on IV azithromycin, IV steroids & bronchodilators. Encourage incentive spirometry. Has a pulmonologist outpatient but does not remember their name   Chronic hypoxic respiratory failure: likely secondary to hx of COPD. On 2 L [place] chronically   Hypocalcemia: s/p calcium gluconate. Continue on oscal    Hypokalemia: WNL today   GERD: continue on PPI  BPH: continue on flomax   DVT prophylaxis: lovenox  Code Status: full  Family Communication:  Disposition Plan: likely d/c back home   Level of care: Progressive Cardiac  Status is: Inpatient  Remains inpatient appropriate because:IV treatments appropriate due to intensity of illness or inability to take PO and Inpatient level of care appropriate due to severity of illness  Dispo: The patient is from: Home              Anticipated d/c is to: Home              Patient currently is not medically stable to d/c.   Difficult to place patient : unclear    Consultants:   Procedures:   Antimicrobials: azithromycin    Subjective: Pt c/o shortness of breath   Objective: Vitals:   05/07/21 0500 05/07/21 0530 05/07/21 0648 05/07/21 0730  BP: (!) 155/80 (!) 151/82  (!) 152/92  Pulse: 85 78  75  Resp:    (!) 22  Temp:      TempSrc:      SpO2: 94% 97%  97%  Weight:   98 kg   Height:        Intake/Output Summary (Last 24 hours) at 05/07/2021 0802 Last data filed at 05/07/2021 0444 Gross per 24 hour  Intake 220 ml  Output --  Net 220 ml   Filed Weights   05/07/21 0648  Weight: 98 kg    Examination:  General exam: Appears uncomfortable & unable to talk in complete sentences Respiratory system: course  breath sounds b/l. Wheezes b/l  Cardiovascular system: S1 & S2 +. No rubs, gallops or clicks.  Gastrointestinal system: Abdomen is nondistended, soft and nontender.  Normal bowel sounds heard. Central nervous system: Alert and oriented. Moves all extremities  Psychiatry: Judgement and insight appear normal. Flat mood and affect    Data Reviewed: I have personally reviewed following labs and imaging studies  CBC: Recent Labs  Lab 05/07/21 0106  WBC 7.9  NEUTROABS 3.1  HGB 14.3  HCT 43.6  MCV 91.0  PLT 220   Basic Metabolic Panel: Recent Labs  Lab 05/07/21 0106  NA 141  140  K 4.1  3.4*  CL 111  104   CO2 25  28  GLUCOSE 147*  136*  BUN 9  11  CREATININE 0.64  0.86  CALCIUM 6.3*  8.7*  MG 1.8   GFR: Estimated Creatinine Clearance: 103.6 mL/min (by C-G formula based on SCr of 0.86 mg/dL). Liver Function Tests: Recent Labs  Lab 05/07/21 0106  AST 27  ALT 26  ALKPHOS 74  BILITOT 0.6  PROT 7.1  ALBUMIN 4.1   No results for input(s): LIPASE, AMYLASE in the last 168 hours. No results for input(s): AMMONIA in the last 168 hours. Coagulation Profile: No results for input(s): INR, PROTIME in the last 168 hours. Cardiac Enzymes: No results for input(s): CKTOTAL, CKMB, CKMBINDEX, TROPONINI in the last 168 hours. BNP (last 3 results) No results for input(s): PROBNP in the last 8760 hours. HbA1C: No results for input(s): HGBA1C in the last 72 hours. CBG: No results for input(s): GLUCAP in the last 168 hours. Lipid Profile: No results for input(s): CHOL, HDL, LDLCALC, TRIG, CHOLHDL, LDLDIRECT in the last 72 hours. Thyroid Function Tests: No results for input(s): TSH, T4TOTAL, FREET4, T3FREE, THYROIDAB in the last 72 hours. Anemia Panel: No results for input(s): VITAMINB12, FOLATE, FERRITIN, TIBC, IRON, RETICCTPCT in the last 72 hours. Sepsis Labs: No results for input(s): PROCALCITON, LATICACIDVEN in the last 168 hours.  Recent Results (from the past 240 hour(s))  Resp Panel by RT-PCR (Flu A&B, Covid) Nasopharyngeal Swab     Status: None   Collection Time: 05/07/21  1:06 AM   Specimen: Nasopharyngeal Swab; Nasopharyngeal(NP) swabs in vial transport medium  Result Value Ref Range Status   SARS Coronavirus 2 by RT PCR NEGATIVE NEGATIVE Final    Comment: (NOTE) SARS-CoV-2 target nucleic acids are NOT DETECTED.  The SARS-CoV-2 RNA is generally detectable in upper respiratory specimens during the acute phase of infection. The lowest concentration of SARS-CoV-2 viral copies this assay can detect is 138 copies/mL. A negative result does not preclude SARS-Cov-2 infection and  should not be used as the sole basis for treatment or other patient management decisions. A negative result may occur with  improper specimen collection/handling, submission of specimen other than nasopharyngeal swab, presence of viral mutation(s) within the areas targeted by this assay, and inadequate number of viral copies(<138 copies/mL). A negative result must be combined with clinical observations, patient history, and epidemiological information. The expected result is Negative.  Fact Sheet for Patients:  BloggerCourse.com  Fact Sheet for Healthcare Providers:  SeriousBroker.it  This test is no t yet approved or cleared by the [place] FDA and  has been authorized for detection and/or diagnosis of SARS-CoV-2 by FDA under an Emergency Use Authorization (EUA). This EUA will remain  in effect (meaning this test can be used) for the duration of the COVID-19 declaration under Section 564(b)(1) of the Act, 21 U.S.C.section  360bbb-3(b)(1), unless the authorization is terminated  or revoked sooner.       Influenza A by PCR NEGATIVE NEGATIVE Final   Influenza B by PCR NEGATIVE NEGATIVE Final    Comment: (NOTE) The Xpert Xpress SARS-CoV-2/FLU/RSV plus assay is intended as an aid in the diagnosis of influenza from Nasopharyngeal swab specimens and should not be used as a sole basis for treatment. Nasal washings and aspirates are unacceptable for Xpert Xpress SARS-CoV-2/FLU/RSV testing.  Fact Sheet for Patients: BloggerCourse.com  Fact Sheet for Healthcare Providers: SeriousBroker.it  This test is not yet approved or cleared by the [place] FDA and has been authorized for detection and/or diagnosis of SARS-CoV-2 by FDA under an Emergency Use Authorization (EUA). This EUA will remain in effect (meaning this test can be used) for the duration of the COVID-19 declaration  under Section 564(b)(1) of the Act, 21 U.S.C. section 360bbb-3(b)(1), unless the authorization is terminated or revoked.  Performed at [facility], [address], [place], [place] [address]          Radiology Studies: [facility] Chest Susan Moore 1 View  Result Date: 05/07/2021 CLINICAL DATA:  Shortness of breath. EXAM: PORTABLE CHEST 1 VIEW COMPARISON:  02/19/2021 FINDINGS: The heart size and mediastinal contours are within normal limits. Both lungs are clear. The visualized skeletal structures are unremarkable. IMPRESSION: No active disease. Electronically Signed   By: Burman Nieves M.D.   On: 05/07/2021 01:36        Scheduled Meds:  brinzolamide  1 drop Left Eye TID   And   brimonidine  1 drop Left Eye TID   calcium-vitamin D  1 tablet Oral TID   [START ON 05/08/2021] enoxaparin (LOVENOX) injection  40 mg Subcutaneous Q24H   Ensure  237 mL Oral BID BM   guaiFENesin  600 mg Oral BID   methylPREDNISolone (SOLU-MEDROL) injection  40 mg Intravenous Q6H   Followed by   Melene Muller ON 05/08/2021] predniSONE  40 mg Oral Q breakfast   montelukast  10 mg Oral Daily   pantoprazole  20 mg Oral Daily   tamsulosin  0.4 mg Oral Daily   Continuous Infusions:  sodium chloride 100 mL/hr at 05/07/21 0354   cefTRIAXone (ROCEPHIN)  IV Stopped (05/07/21 0444)   magnesium sulfate bolus IVPB       LOS: 0 days    Time spent: 33 mins    Charise Killian, MD [facility] Pager [phone]  If 7PM-7AM, please contact night-coverage [email or web address] 05/07/2021, 8:02 AM

## 2021-05-08 DIAGNOSIS — E876 Hypokalemia: Secondary | ICD-10-CM

## 2021-05-08 DIAGNOSIS — N400 Enlarged prostate without lower urinary tract symptoms: Secondary | ICD-10-CM

## 2021-05-08 DIAGNOSIS — K219 Gastro-esophageal reflux disease without esophagitis: Secondary | ICD-10-CM

## 2021-05-08 DIAGNOSIS — J441 Chronic obstructive pulmonary disease with (acute) exacerbation: Principal | ICD-10-CM

## 2021-05-08 DIAGNOSIS — J9611 Chronic respiratory failure with hypoxia: Secondary | ICD-10-CM

## 2021-05-08 DIAGNOSIS — H409 Unspecified glaucoma: Secondary | ICD-10-CM

## 2021-05-08 LAB — BASIC METABOLIC PANEL
Anion gap: 5 (ref 5–15)
BUN: 19 mg/dL (ref 8–23)
CO2: 31 mmol/L (ref 22–32)
Calcium: 9.4 mg/dL (ref 8.9–10.3)
Chloride: 99 mmol/L (ref 98–111)
Creatinine, Ser: 0.94 mg/dL (ref 0.61–1.24)
GFR, Estimated: >60 mL/min (ref >60)
Glucose, Bld: 255 mg/dL — ABNORMAL HIGH (ref 70–99)
Potassium: 4.6 mmol/L (ref 3.5–5.1)
Sodium: 135 mmol/L (ref 135–145)

## 2021-05-08 LAB — CBC
HCT: 39.9 % (ref 39.0–52.0)
Hemoglobin: 13.3 g/dL (ref 13.0–17.0)
MCH: 29.6 pg (ref 26.0–34.0)
MCHC: 33.3 g/dL (ref 30.0–36.0)
MCV: 88.9 fL (ref 80.0–100.0)
Platelets: 207 10*3/uL (ref 150–400)
RBC: 4.49 MIL/uL (ref 4.22–5.81)
RDW: 13.3 % (ref 11.5–15.5)
WBC: 17.5 10*3/uL — ABNORMAL HIGH (ref 4.0–10.5)
nRBC: 0 % (ref 0.0–0.2)

## 2021-05-08 LAB — MAGNESIUM: Magnesium: 1.9 mg/dL (ref 1.7–2.4)

## 2021-05-08 MED ORDER — ALPRAZOLAM 0.5 MG PO TABS
0.2500 mg | ORAL_TABLET | Freq: Three times a day (TID) | ORAL | Status: DC | PRN
  Administered 2021-05-08 – 2021-05-12 (×8): 0.25 mg via ORAL
  Filled 2021-05-08 (×8): qty 1

## 2021-05-08 MED ORDER — MAGNESIUM OXIDE -MG SUPPLEMENT 400 (240 MG) MG PO TABS
400.0000 mg | ORAL_TABLET | Freq: Every day | ORAL | Status: DC
  Administered 2021-05-09 – 2021-05-13 (×5): 400 mg via ORAL
  Filled 2021-05-08 (×5): qty 1

## 2021-05-08 NOTE — Progress Notes (Signed)
Patient ID: Bryan Kelley, male   DOB: [birth year], 65 y.o.   MRN: [id number] [facility] PROGRESS NOTE  Bryan Kelley:[id number] DOB: 1956-09-19 DOA: 05/07/2021 PCP: [name]  HPI/Subjective: Patient feeling a little bit better.  Still wheezing and coughing.  Still gets anxious and asked for some Xanax.  As for some cough medication.  Admitted with COPD exacerbation.  Objective: Vitals:   05/08/21 1134 05/08/21 1144  BP:  (!) 154/77  Pulse:  74  Resp:  17  Temp:  97.9 F (36.6 C)  SpO2: 97% 98%    Intake/Output Summary (Last 24 hours) at 05/08/2021 1458 Last data filed at 05/08/2021 2505 Gross per 24 hour  Intake 1080 ml  Output 250 ml  Net 830 ml   Filed Weights   05/07/21 0648  Weight: 98 kg    ROS: Review of Systems  Respiratory:  Positive for cough, shortness of breath and wheezing.   Cardiovascular:  Negative for chest pain.  Gastrointestinal:  Negative for abdominal pain, nausea and vomiting.  Exam: Physical Exam HENT:     Head: Normocephalic.     Mouth/Throat:     Pharynx: No oropharyngeal exudate.  Eyes:     General: Lids are normal.     Conjunctiva/sclera: Conjunctivae normal.     Pupils: Pupils are equal, round, and reactive to light.  Cardiovascular:     Rate and Rhythm: Normal rate and regular rhythm.     Heart sounds: Normal heart sounds, S1 normal and S2 normal.  Pulmonary:     Breath sounds: Examination of the right-middle field reveals decreased breath sounds and wheezing. Examination of the left-middle field reveals decreased breath sounds and wheezing. Examination of the right-lower field reveals decreased breath sounds and wheezing. Examination of the left-lower field reveals decreased breath sounds and wheezing. Decreased breath sounds and wheezing present. No rhonchi or rales.  Abdominal:     Palpations: Abdomen is soft.     Tenderness: There is no abdominal tenderness.  Musculoskeletal:     Right lower leg: No  swelling.     Left lower leg: No swelling.  Skin:    General: Skin is warm.     Findings: No rash.  Neurological:     Mental Status: He is alert and oriented to person, place, and time.     Data Reviewed: Basic Metabolic Panel: Recent Labs  Lab 05/07/21 0106 05/08/21 0449  NA 141  140 135  K 4.1  3.4* 4.6  CL 111  104 99  CO2 25  28 31   GLUCOSE 147*  136* 255*  BUN 9  11 19   CREATININE 0.64  0.86 0.94  CALCIUM 6.3*  8.7* 9.4  MG 1.8 1.9   Liver Function Tests: Recent Labs  Lab 05/07/21 0106  AST 27  ALT 26  ALKPHOS 74  BILITOT 0.6  PROT 7.1  ALBUMIN 4.1    CBC: Recent Labs  Lab 05/07/21 0106 05/07/21 1652 05/08/21 0449  WBC 7.9 10.0 17.5*  NEUTROABS 3.1  --   --   HGB 14.3 13.5 13.3  HCT 43.6 40.1 39.9  MCV 91.0 88.7 88.9  PLT 220 203 207    BNP (last 3 results) Recent Labs    09/09/20 0500 11/22/20 0409 05/07/21 0106  BNP 37.4 24.7 17.7     Recent Results (from the past 240 hour(s))  Resp Panel by RT-PCR (Flu A&B, Covid) Nasopharyngeal Swab     Status: None   Collection  Time: 05/07/21  1:06 AM   Specimen: Nasopharyngeal Swab; Nasopharyngeal(NP) swabs in vial transport medium  Result Value Ref Range Status   SARS Coronavirus 2 by RT PCR NEGATIVE NEGATIVE Final    Comment: (NOTE) SARS-CoV-2 target nucleic acids are NOT DETECTED.  The SARS-CoV-2 RNA is generally detectable in upper respiratory specimens during the acute phase of infection. The lowest concentration of SARS-CoV-2 viral copies this assay can detect is 138 copies/mL. A negative result does not preclude SARS-Cov-2 infection and should not be used as the sole basis for treatment or other patient management decisions. A negative result may occur with  improper specimen collection/handling, submission of specimen other than nasopharyngeal swab, presence of viral mutation(s) within the areas targeted by this assay, and inadequate number of viral copies(<138 copies/mL). A  negative result must be combined with clinical observations, patient history, and epidemiological information. The expected result is Negative.  Fact Sheet for Patients:  BloggerCourse.com  Fact Sheet for Healthcare Providers:  SeriousBroker.it  This test is no t yet approved or cleared by the [place] FDA and  has been authorized for detection and/or diagnosis of SARS-CoV-2 by FDA under an Emergency Use Authorization (EUA). This EUA will remain  in effect (meaning this test can be used) for the duration of the COVID-19 declaration under Section 564(b)(1) of the Act, 21 U.S.C.section 360bbb-3(b)(1), unless the authorization is terminated  or revoked sooner.       Influenza A by PCR NEGATIVE NEGATIVE Final   Influenza B by PCR NEGATIVE NEGATIVE Final    Comment: (NOTE) The Xpert Xpress SARS-CoV-2/FLU/RSV plus assay is intended as an aid in the diagnosis of influenza from Nasopharyngeal swab specimens and should not be used as a sole basis for treatment. Nasal washings and aspirates are unacceptable for Xpert Xpress SARS-CoV-2/FLU/RSV testing.  Fact Sheet for Patients: BloggerCourse.com  Fact Sheet for Healthcare Providers: SeriousBroker.it  This test is not yet approved or cleared by the [place] FDA and has been authorized for detection and/or diagnosis of SARS-CoV-2 by FDA under an Emergency Use Authorization (EUA). This EUA will remain in effect (meaning this test can be used) for the duration of the COVID-19 declaration under Section 564(b)(1) of the Act, 21 U.S.C. section 360bbb-3(b)(1), unless the authorization is terminated or revoked.  Performed at [facility], [address] [place], [place] [address]   Culture, blood (Routine X 2) w Reflex to ID Panel     Status: None (Preliminary result)   Collection Time: 05/07/21  5:13 AM   Specimen: BLOOD   Result Value Ref Range Status   Specimen Description BLOOD BLOOD LEFT HAND  Final   Special Requests   Final    BOTTLES DRAWN AEROBIC AND ANAEROBIC Blood Culture adequate volume   Culture   Final    NO GROWTH 1 DAY Performed at [facility], [address], [place], [place] [address]    Report Status PENDING  Incomplete  Culture, blood (Routine X 2) w Reflex to ID Panel     Status: None (Preliminary result)   Collection Time: 05/07/21  5:14 AM   Specimen: BLOOD  Result Value Ref Range Status   Specimen Description BLOOD LEFT ANTECUBITAL  Final   Special Requests   Final    BOTTLES DRAWN AEROBIC AND ANAEROBIC Blood Culture adequate volume   Culture   Final    NO GROWTH 1 DAY Performed at [facility], [address], [place], [place] [address]    Report  Status PENDING  Incomplete     Studies: DG Chest Port 1 View  Result Date: 05/07/2021 CLINICAL DATA:  Shortness of breath. EXAM: PORTABLE CHEST 1 VIEW COMPARISON:  02/19/2021 FINDINGS: The heart size and mediastinal contours are within normal limits. Both lungs are clear. The visualized skeletal structures are unremarkable. IMPRESSION: No active disease. Electronically Signed   By: Burman Nieves M.D.   On: 05/07/2021 01:36    Scheduled Meds:  brinzolamide  1 drop Left Eye TID   And   brimonidine  1 drop Left Eye TID   calcium-vitamin D  1 tablet Oral TID   enoxaparin (LOVENOX) injection  40 mg Subcutaneous Q24H   Ensure  237 mL Oral BID BM   guaiFENesin  600 mg Oral BID   ipratropium-albuterol  3 mL Nebulization Q4H   [START ON 05/09/2021] magnesium oxide  400 mg Oral Daily   methylPREDNISolone (SOLU-MEDROL) injection  40 mg Intravenous Q6H   mometasone-formoterol  2 puff Inhalation BID   montelukast  10 mg Oral Daily   pantoprazole  20 mg Oral Daily   tamsulosin  0.4 mg Oral Daily   Continuous Infusions:  azithromycin 500 mg (05/08/21 0804)    Assessment/Plan:  COPD with diffuse wheeze.  Still on  high-dose Solu-Medrol, Zithromax Dulera inhaler and nebulizer treatments.  Incentive spirometry.  Ambulate patient. Chronic hypoxic respiratory failure.  Chronically on 2 L of oxygen. Hypocalcemia only seen on 1 blood draw.  Suspect error rather than true hypocalcemia. Hypokalemia normal range BPH on Flomax GERD on Protonix Glaucoma on brinzolamide and brimonidine        Code Status:     Code Status Orders  (From admission, onward)           Start     Ordered   05/07/21 0334  Full code  Continuous        05/07/21 0335           Code Status History     Date Active Date Inactive Code Status Order ID Comments User Context   02/19/2021 1003 02/22/2021 1707 Full Code [id number]  Lucile Shutters, MD ED   11/22/2020 0529 11/24/2020 1931 Full Code [id number]  Andris Baumann, MD ED   10/06/2020 0018 10/07/2020 2211 Full Code [id number]  Mansy, Vernetta Honey, MD ED   09/09/2020 0748 09/12/2020 1706 Full Code [id number]  Lorretta Harp, MD ED   07/25/2020 1050 07/28/2020 2206 Full Code [id number]  Lorretta Harp, MD ED   12/06/2019 1531 12/11/2019 1709 Full Code [id number]  Lorretta Harp, MD ED   10/29/2019 2105 11/01/2019 1608 Full Code [id number]  Andris Baumann, MD ED      Family Communication: Updated patient's sister Disposition Plan: Status is: Inpatient  Dispo: The patient is from: Home              Anticipated d/c is to: Home              Patient currently still with diffuse wheezing inspiratory and expiratory.  Still needs more time here in the hospital.   Difficult to place patient.  No  Time spent: 28 minutes  Bryan Kelley

## 2021-05-08 NOTE — Plan of Care (Signed)
°  Problem: Education: °Goal: Knowledge of General Education information will improve °Description: Including pain rating scale, medication(s)/side effects and non-pharmacologic comfort measures °Outcome: Progressing °  °Problem: Clinical Measurements: °Goal: Respiratory complications will improve °Outcome: Progressing °  °Problem: Clinical Measurements: °Goal: Cardiovascular complication will be avoided °Outcome: Progressing °  °Problem: Nutrition: °Goal: Adequate nutrition will be maintained °Outcome: Progressing °  °

## 2021-05-08 NOTE — Progress Notes (Signed)
Pt continues to have bilateral wheezing but states that his breathing is "much better than yesterday". O2 sats 98% on 2L. Ambulated in room today. Will continue to monitor.

## 2021-05-08 NOTE — Progress Notes (Addendum)
Pt was very anxious this morning and states takes xanax at home. MD Mansy made aware. Will continue to monitor.  Update 0429: MD Mansy ordered Alprazolam (Xanax) tablet 0.25 mg three times day PRN. Will continue to monitor.  Update 0602: Lab called and reported that pt calcium was at 9.4 this morning from 6.3 yesterday which the pt received replacement yesterday. MD Mansy made aware. Will continue to monitor.  Update 0607: Talked to MD [facility] but no new order was place. Will continue to monitor.

## 2021-05-09 ENCOUNTER — Inpatient Hospital Stay: Payer: Medicaid Other

## 2021-05-09 LAB — BLOOD CULTURE ID PANEL (REFLEXED) - BCID2

## 2021-05-09 MED ORDER — VANCOMYCIN HCL 2000 MG/400ML IV SOLN
2000.0000 mg | Freq: Once | INTRAVENOUS | Status: AC
  Administered 2021-05-09: 2000 mg via INTRAVENOUS
  Filled 2021-05-09: qty 400

## 2021-05-09 MED ORDER — AZITHROMYCIN 250 MG PO TABS
500.0000 mg | ORAL_TABLET | Freq: Every day | ORAL | Status: AC
  Administered 2021-05-10 – 2021-05-11 (×2): 500 mg via ORAL
  Filled 2021-05-09 (×3): qty 2

## 2021-05-09 MED ORDER — VANCOMYCIN HCL 1250 MG/250ML IV SOLN
1250.0000 mg | Freq: Two times a day (BID) | INTRAVENOUS | Status: DC
  Administered 2021-05-10: 1250 mg via INTRAVENOUS
  Filled 2021-05-09 (×2): qty 250

## 2021-05-09 MED ORDER — METHYLPREDNISOLONE SODIUM SUCC 125 MG IJ SOLR
60.0000 mg | Freq: Two times a day (BID) | INTRAMUSCULAR | Status: DC
  Administered 2021-05-09 – 2021-05-11 (×4): 60 mg via INTRAVENOUS
  Filled 2021-05-09 (×4): qty 2

## 2021-05-09 MED ORDER — METHYLPREDNISOLONE SODIUM SUCC 40 MG IJ SOLR: 40.0000 mg | Freq: Two times a day (BID) | INTRAMUSCULAR | Status: DC

## 2021-05-09 NOTE — Plan of Care (Signed)
  Problem: Health Behavior/Discharge Planning: Goal: Ability to manage health-related needs will improve Outcome: Progressing   Problem: Clinical Measurements: Goal: Respiratory complications will improve Outcome: Progressing   Problem: Clinical Measurements: Goal: Cardiovascular complication will be avoided Outcome: Progressing   Problem: Activity: Goal: Risk for activity intolerance will decrease Outcome: Progressing   

## 2021-05-09 NOTE — Consult Note (Signed)
Pharmacy Antibiotic Note  Bryan Kelley is a 65 y.o. male admitted on 05/07/2021 with SOB/COPD exacerbation. Started on azithromycin IV.  7/7: BCID result: 1/4 Staphylococcal species Pharmacy has been consulted for Vancomycin dosing.  Plan: Vancomycin 2000 mg LD x 1 followed by Vancomycin 1250 mg Q12H. Goal AUC 400-550 Expected AUC: 475 Vd used: 0.72 Scr used: 0.94 Follow up culture results Levels at steady state if warranted   Height: 5\' 11"  (180.3 cm) Weight: 98 kg (216 lb 0.8 oz) IBW/kg (Calculated) : 75.3  Temp (24hrs), Avg:98.2 F (36.8 C), Min:98 F (36.7 C), Max:98.7 F (37.1 C)  Recent Labs  Lab 05/07/21 0106 05/07/21 1652 05/08/21 0449  WBC 7.9 10.0 17.5*  CREATININE 0.64  0.86  --  0.94    Estimated Creatinine Clearance: 94.8 mL/min (by C-G formula based on SCr of 0.94 mg/dL).    No Known Allergies  Antimicrobials this admission: 7/5 azithromycin >>  7/7 vancomycin >>   Dose adjustments this admission: N/a  Microbiology results: 7/5 BCx: 1/4 staph species 7/7 Bcx: sent  7/5 Sputum: sent   Thank you for allowing pharmacy to be a part of this patient's care.  9/5, PharmD, BCPS Clinical Pharmacist   05/09/2021 8:22 PM

## 2021-05-09 NOTE — Progress Notes (Signed)
Patient ID: Bryan Kelley, male   DOB: 1956-05-12, 65 y.o.   MRN: [id number] [facility] PROGRESS NOTE  Bryan Kelley:[id number] DOB: 23-May-1956 DOA: 05/07/2021 [facility]  HPI/Subjective: Patient still with shortness of breath cough and wheezing.  Still not feeling well.  Admitted with COPD exacerbation.  Still does not feel like his breathing is back to normal.  Objective: Vitals:   05/09/21 0753 05/09/21 1148  BP: (!) 158/88 (!) 164/82  Pulse: 66 72  Resp: 17 17  Temp: 98.2 F (36.8 C) 98.1 F (36.7 C)  SpO2: 100% 97%    Intake/Output Summary (Last 24 hours) at 05/09/2021 1505 Last data filed at 05/09/2021 1340 Gross per 24 hour  Intake 2153 ml  Output 475 ml  Net 1678 ml   Filed Weights   05/07/21 0648  Weight: 98 kg    ROS: Review of Systems  Respiratory:  Positive for cough, shortness of breath and wheezing.   Cardiovascular:  Negative for chest pain.  Gastrointestinal:  Negative for abdominal pain, nausea and vomiting.  Exam: Physical Exam HENT:     Head: Normocephalic.     Mouth/Throat:     Pharynx: No oropharyngeal exudate.  Eyes:     General: Lids are normal.     Conjunctiva/sclera: Conjunctivae normal.     Comments: Left eye clouded over.  Cardiovascular:     Rate and Rhythm: Normal rate and regular rhythm.     Heart sounds: Normal heart sounds, S1 normal and S2 normal.  Pulmonary:     Breath sounds: Examination of the right-middle field reveals decreased breath sounds and wheezing. Examination of the left-middle field reveals decreased breath sounds and wheezing. Examination of the right-lower field reveals decreased breath sounds and wheezing. Examination of the left-lower field reveals decreased breath sounds and wheezing. Decreased breath sounds and wheezing present. No rhonchi or rales.  Abdominal:     Palpations: Abdomen is soft.     Tenderness: There is no abdominal tenderness.  Musculoskeletal:     Right  lower leg: No swelling.     Left lower leg: No swelling.  Skin:    General: Skin is warm.     Findings: No rash.  Neurological:     Mental Status: He is alert and oriented to person, place, and time.     Data Reviewed: Basic Metabolic Panel: Recent Labs  Lab 05/07/21 0106 05/08/21 0449  NA 141  140 135  K 4.1  3.4* 4.6  CL 111  104 99  CO2 25  28 31   GLUCOSE 147*  136* 255*  BUN 9  11 19   CREATININE 0.64  0.86 0.94  CALCIUM 6.3*  8.7* 9.4  MG 1.8 1.9   Liver Function Tests: Recent Labs  Lab 05/07/21 0106  AST 27  ALT 26  ALKPHOS 74  BILITOT 0.6  PROT 7.1  ALBUMIN 4.1   CBC: Recent Labs  Lab 05/07/21 0106 05/07/21 1652 05/08/21 0449  WBC 7.9 10.0 17.5*  NEUTROABS 3.1  --   --   HGB 14.3 13.5 13.3  HCT 43.6 40.1 39.9  MCV 91.0 88.7 88.9  PLT 220 203 207    BNP (last 3 results) Recent Labs    09/09/20 0500 11/22/20 0409 05/07/21 0106  BNP 37.4 24.7 17.7     Recent Results (from the past 240 hour(s))  Resp Panel by RT-PCR (Flu A&B, Covid) Nasopharyngeal Swab     Status: None   Collection Time:  05/07/21  1:06 AM   Specimen: Nasopharyngeal Swab; Nasopharyngeal(NP) swabs in vial transport medium  Result Value Ref Range Status   SARS Coronavirus 2 by RT PCR NEGATIVE NEGATIVE Final    Comment: (NOTE) SARS-CoV-2 target nucleic acids are NOT DETECTED.  The SARS-CoV-2 RNA is generally detectable in upper respiratory specimens during the acute phase of infection. The lowest concentration of SARS-CoV-2 viral copies this assay can detect is 138 copies/mL. A negative result does not preclude SARS-Cov-2 infection and should not be used as the sole basis for treatment or other patient management decisions. A negative result may occur with  improper specimen collection/handling, submission of specimen other than nasopharyngeal swab, presence of viral mutation(s) within the areas targeted by this assay, and inadequate number of viral copies(<138  copies/mL). A negative result must be combined with clinical observations, patient history, and epidemiological information. The expected result is Negative.  Fact Sheet for Patients:  BloggerCourse.com  Fact Sheet for Healthcare Providers:  SeriousBroker.it  This test is no t yet approved or cleared by the [place] FDA and  has been authorized for detection and/or diagnosis of SARS-CoV-2 by FDA under an Emergency Use Authorization (EUA). This EUA will remain  in effect (meaning this test can be used) for the duration of the COVID-19 declaration under Section 564(b)(1) of the Act, 21 U.S.C.section 360bbb-3(b)(1), unless the authorization is terminated  or revoked sooner.       Influenza A by PCR NEGATIVE NEGATIVE Final   Influenza B by PCR NEGATIVE NEGATIVE Final    Comment: (NOTE) The Xpert Xpress SARS-CoV-2/FLU/RSV plus assay is intended as an aid in the diagnosis of influenza from Nasopharyngeal swab specimens and should not be used as a sole basis for treatment. Nasal washings and aspirates are unacceptable for Xpert Xpress SARS-CoV-2/FLU/RSV testing.  Fact Sheet for Patients: BloggerCourse.com  Fact Sheet for Healthcare Providers: SeriousBroker.it  This test is not yet approved or cleared by the [place] FDA and has been authorized for detection and/or diagnosis of SARS-CoV-2 by FDA under an Emergency Use Authorization (EUA). This EUA will remain in effect (meaning this test can be used) for the duration of the COVID-19 declaration under Section 564(b)(1) of the Act, 21 U.S.C. section 360bbb-3(b)(1), unless the authorization is terminated or revoked.  Performed at [facility], [address] [place], [place] [address]   Culture, blood (Routine X 2) w Reflex to ID Panel     Status: None (Preliminary result)   Collection Time: 05/07/21  5:13 AM    Specimen: BLOOD  Result Value Ref Range Status   Specimen Description BLOOD BLOOD LEFT HAND  Final   Special Requests   Final    BOTTLES DRAWN AEROBIC AND ANAEROBIC Blood Culture adequate volume   Culture   Final    NO GROWTH 2 DAYS Performed at [facility], [address] [place], [place] [address]    Report Status PENDING  Incomplete  Culture, blood (Routine X 2) w Reflex to ID Panel     Status: None (Preliminary result)   Collection Time: 05/07/21  5:14 AM   Specimen: BLOOD  Result Value Ref Range Status   Specimen Description BLOOD LEFT ANTECUBITAL  Final   Special Requests   Final    BOTTLES DRAWN AEROBIC AND ANAEROBIC Blood Culture adequate volume   Culture   Final    NO GROWTH 2 DAYS Performed at [facility], [address], [place], [place] [address]    Report Status  PENDING  Incomplete      Scheduled Meds:  [START ON 05/10/2021] azithromycin  500 mg Oral Daily   brinzolamide  1 drop Left Eye TID   And   brimonidine  1 drop Left Eye TID   calcium-vitamin D  1 tablet Oral TID   enoxaparin (LOVENOX) injection  40 mg Subcutaneous Q24H   Ensure  237 mL Oral BID BM   guaiFENesin  600 mg Oral BID   ipratropium-albuterol  3 mL Nebulization Q4H   magnesium oxide  400 mg Oral Daily   methylPREDNISolone (SOLU-MEDROL) injection  60 mg Intravenous Q12H   mometasone-formoterol  2 puff Inhalation BID   montelukast  10 mg Oral Daily   pantoprazole  20 mg Oral Daily   tamsulosin  0.4 mg Oral Daily   Assessment/Plan:  COPD with diffuse wheeze.  Continue Solu-Medrol twice daily dosing, Zithromax, nebulizer treatments and Dulera inhaler.  Continue incentive spirometry.  Still with numerous bronchospasm. Chronic hypoxic respiratory failure secondary to COPD on 2 L of oxygen chronically Hypokalemia and hypocalcemia.  Both replaced into the normal range BPH on Flomax GERD on Protonix Glaucoma unspecified continue eyedrops     Code Status:     Code Status  Orders  (From admission, onward)           Start     Ordered   05/07/21 0334  Full code  Continuous        05/07/21 0335           Code Status History     Date Active Date Inactive Code Status Order ID Comments User Context   02/19/2021 1003 02/22/2021 1707 Full Code [id number]  Lucile Shutters, MD ED   11/22/2020 0529 11/24/2020 1931 Full Code [id number]  Andris Baumann, MD ED   10/06/2020 0018 10/07/2020 2211 Full Code [id number]  Mansy, Vernetta Honey, MD ED   09/09/2020 0748 09/12/2020 1706 Full Code [id number]  Lorretta Harp, MD ED   07/25/2020 1050 07/28/2020 2206 Full Code [id number]  Lorretta Harp, MD ED   12/06/2019 1531 12/11/2019 1709 Full Code [id number]  Lorretta Harp, MD ED   10/29/2019 2105 11/01/2019 1608 Full Code [id number]  Andris Baumann, MD ED      Family Communication: Left message for sister Disposition Plan: Status is: Inpatient  Dispo: The patient is from: Home              Anticipated d/c is to: Home              Patient currently with too much bronchospasm to go home yet   Difficult to place patient. No  Time spent: 27 minutes  Braniya [employer]

## 2021-05-09 NOTE — Consult Note (Signed)
PHARMACY - PHYSICIAN COMMUNICATION CRITICAL VALUE ALERT - BLOOD CULTURE IDENTIFICATION (BCID)  Bryan Kelley is an 65 y.o. male who presented to [facility] on 05/07/2021 with a chief complaint of respiratory difficulties, CPD exacerbation  Assessment:  BCID 1/4 bottles (aerobic) GPC, staph species, not resistance noted. Possilbe site of origin PNA vs contaminant  Name of physician (or Provider) Contacted: [place]  Current antibiotics: on azithromycin 500mg  daily  Changes to prescribed antibiotics recommended:  If felt that patient is infectious, may consider adding vanc to current regimen vs  monitor and possibly repeat cultures if needed.  Results for orders placed or performed during the hospital encounter of 05/07/21  Blood Culture ID Panel (Reflexed) (Collected: 05/07/2021  5:13 AM)  Result Value Ref Range   Enterococcus faecalis NOT DETECTED NOT DETECTED   Enterococcus Faecium NOT DETECTED NOT DETECTED   Listeria monocytogenes NOT DETECTED NOT DETECTED   Staphylococcus species DETECTED (A) NOT DETECTED   Staphylococcus aureus (BCID) NOT DETECTED NOT DETECTED   Staphylococcus epidermidis NOT DETECTED NOT DETECTED   Staphylococcus lugdunensis NOT DETECTED NOT DETECTED   Streptococcus species NOT DETECTED NOT DETECTED   Streptococcus agalactiae NOT DETECTED NOT DETECTED   Streptococcus pneumoniae NOT DETECTED NOT DETECTED   Streptococcus pyogenes NOT DETECTED NOT DETECTED   A.calcoaceticus-baumannii NOT DETECTED NOT DETECTED   Bacteroides fragilis NOT DETECTED NOT DETECTED   Enterobacterales NOT DETECTED NOT DETECTED   Enterobacter cloacae complex NOT DETECTED NOT DETECTED   Escherichia coli NOT DETECTED NOT DETECTED   Klebsiella aerogenes NOT DETECTED NOT DETECTED   Klebsiella oxytoca NOT DETECTED NOT DETECTED   Klebsiella pneumoniae NOT DETECTED NOT DETECTED   Proteus species NOT DETECTED NOT DETECTED   Salmonella species NOT DETECTED NOT DETECTED   Serratia marcescens NOT  DETECTED NOT DETECTED   Haemophilus influenzae NOT DETECTED NOT DETECTED   Neisseria meningitidis NOT DETECTED NOT DETECTED   Pseudomonas aeruginosa NOT DETECTED NOT DETECTED   Stenotrophomonas maltophilia NOT DETECTED NOT DETECTED   Candida albicans NOT DETECTED NOT DETECTED   Candida auris NOT DETECTED NOT DETECTED   Candida glabrata NOT DETECTED NOT DETECTED   Candida krusei NOT DETECTED NOT DETECTED   Candida parapsilosis NOT DETECTED NOT DETECTED   Candida tropicalis NOT DETECTED NOT DETECTED   Cryptococcus neoformans/gattii NOT DETECTED NOT DETECTED    07/08/2021 05/09/2021  6:52 PM

## 2021-05-10 ENCOUNTER — Inpatient Hospital Stay
Admit: 2021-05-10 | Discharge: 2021-05-10 | Disposition: A | Discharge status: Home / Self Care | Payer: Medicaid Other | Attending: Internal Medicine | Admitting: Internal Medicine

## 2021-05-10 DIAGNOSIS — F419 Anxiety disorder, unspecified: Secondary | ICD-10-CM

## 2021-05-10 LAB — CBC WITH DIFFERENTIAL/PLATELET
Abs Immature Granulocytes: 0.24 10*3/uL — ABNORMAL HIGH (ref 0.00–0.07)
Basophils Absolute: 0 10*3/uL (ref 0.0–0.1)
Basophils Relative: 0 %
Eosinophils Absolute: 0 10*3/uL (ref 0.0–0.5)
Eosinophils Relative: 0 %
HCT: 42.8 % (ref 39.0–52.0)
Hemoglobin: 14 g/dL (ref 13.0–17.0)
Immature Granulocytes: 1 %
Lymphocytes Relative: 6 %
Lymphs Abs: 1 10*3/uL (ref 0.7–4.0)
MCH: 29.6 pg (ref 26.0–34.0)
MCHC: 32.7 g/dL (ref 30.0–36.0)
MCV: 90.5 fL (ref 80.0–100.0)
Monocytes Absolute: 0.6 10*3/uL (ref 0.1–1.0)
Monocytes Relative: 4 %
Neutro Abs: 15.5 10*3/uL — ABNORMAL HIGH (ref 1.7–7.7)
Neutrophils Relative %: 89 %
Platelets: 205 10*3/uL (ref 150–400)
RBC: 4.73 MIL/uL (ref 4.22–5.81)
RDW: 13.3 % (ref 11.5–15.5)
WBC: 17.4 10*3/uL — ABNORMAL HIGH (ref 4.0–10.5)
nRBC: 0 % (ref 0.0–0.2)

## 2021-05-10 LAB — BASIC METABOLIC PANEL
Anion gap: 5 (ref 5–15)
Anion gap: 8 (ref 5–15)
BUN: 9 mg/dL (ref 8–23)
BUN: 28 mg/dL — ABNORMAL HIGH (ref 8–23)
CO2: 25 mmol/L (ref 22–32)
CO2: 32 mmol/L (ref 22–32)
Calcium: 6.3 mg/dL — CL (ref 8.9–10.3)
Calcium: 9.9 mg/dL (ref 8.9–10.3)
Chloride: 111 mmol/L (ref 98–111)
Chloride: 93 mmol/L — ABNORMAL LOW (ref 98–111)
Creatinine, Ser: 0.64 mg/dL (ref 0.61–1.24)
Creatinine, Ser: 0.93 mg/dL (ref 0.61–1.24)
GFR, Estimated: >60 mL/min (ref >60)
GFR, Estimated: >60 mL/min (ref >60)
Glucose, Bld: 147 mg/dL — ABNORMAL HIGH (ref 70–99)
Glucose, Bld: 327 mg/dL — ABNORMAL HIGH (ref 70–99)
Potassium: 4.1 mmol/L (ref 3.5–5.1)
Potassium: 4.9 mmol/L (ref 3.5–5.1)
Sodium: 141 mmol/L (ref 135–145)
Sodium: 133 mmol/L — ABNORMAL LOW (ref 135–145)

## 2021-05-10 LAB — ECHOCARDIOGRAM COMPLETE
AR max vel: 2.77 cm2
AV Area VTI: 3.11 cm2
AV Area mean vel: 2.95 cm2
AV Mean grad: 3 mmHg
AV Peak grad: 5.7 mmHg
Ao pk vel: 1.19 m/s
Area-P 1/2: 3.68 cm2
Height: 71 in
S' Lateral: 2.59 cm
Weight: 3456.81 oz

## 2021-05-10 LAB — GLUCOSE, CAPILLARY
Glucose-Capillary: 269 mg/dL — ABNORMAL HIGH (ref 70–99)
Glucose-Capillary: 288 mg/dL — ABNORMAL HIGH (ref 70–99)
Glucose-Capillary: 168 mg/dL — ABNORMAL HIGH (ref 70–99)
Glucose-Capillary: 297 mg/dL — ABNORMAL HIGH (ref 70–99)

## 2021-05-10 MED ORDER — INSULIN ASPART 100 UNIT/ML IJ SOLN
0.0000 [IU] | Freq: Every day | INTRAMUSCULAR | Status: DC
  Administered 2021-05-10 – 2021-05-12 (×3): 3 [IU] via SUBCUTANEOUS
  Filled 2021-05-10 (×3): qty 1

## 2021-05-10 MED ORDER — INSULIN ASPART 100 UNIT/ML IJ SOLN
3.0000 [IU] | Freq: Three times a day (TID) | INTRAMUSCULAR | Status: DC
  Administered 2021-05-10 – 2021-05-13 (×9): 3 [IU] via SUBCUTANEOUS
  Filled 2021-05-10 (×9): qty 1

## 2021-05-10 MED ORDER — THEOPHYLLINE ER 100 MG PO TB12
200.0000 mg | ORAL_TABLET | Freq: Two times a day (BID) | ORAL | Status: DC
  Filled 2021-05-10: qty 1
  Filled 2021-05-10: qty 2

## 2021-05-10 MED ORDER — INSULIN GLARGINE 100 UNIT/ML ~~LOC~~ SOLN
8.0000 [IU] | Freq: Every day | SUBCUTANEOUS | Status: DC
  Administered 2021-05-10 – 2021-05-12 (×3): 8 [IU] via SUBCUTANEOUS
  Filled 2021-05-10 (×5): qty 0.08

## 2021-05-10 MED ORDER — INSULIN ASPART 100 UNIT/ML IJ SOLN
0.0000 [IU] | Freq: Three times a day (TID) | INTRAMUSCULAR | Status: DC
  Administered 2021-05-10: 2 [IU] via SUBCUTANEOUS
  Administered 2021-05-10: 5 [IU] via SUBCUTANEOUS
  Administered 2021-05-11 – 2021-05-12 (×4): 2 [IU] via SUBCUTANEOUS
  Administered 2021-05-12: 1 [IU] via SUBCUTANEOUS
  Administered 2021-05-13: 2 [IU] via SUBCUTANEOUS
  Filled 2021-05-10 (×8): qty 1

## 2021-05-10 MED ORDER — THEOPHYLLINE ER 400 MG PO TB24
400.0000 mg | ORAL_TABLET | Freq: Every day | ORAL | Status: DC
  Administered 2021-05-11 – 2021-05-12 (×2): 400 mg via ORAL
  Filled 2021-05-10 (×5): qty 1

## 2021-05-10 NOTE — Progress Notes (Signed)
*  PRELIMINARY RESULTS* Echocardiogram 2D Echocardiogram has been performed.  Bryan Kelley 05/10/2021, 8:58 AM

## 2021-05-10 NOTE — TOC Initial Note (Addendum)
Transition of Care [facility]) - Initial/Assessment Note    Patient Details  Name: Bryan Kelley MRN: [id number] Date of Birth: [birth year]  Transition of Care [facility]) CM/SW Contact:    Candie Chroman, LCSW Phone Number: 05/10/2021, 10:28 AM  Clinical Narrative:  Readmission prevention screen complete. CSW met with patient. No supports at bedside. CSW introduced role and explained that discharge planning would be discussed. Patient's sister lives with him. PCP is a male provider at [facility]. He cannot remember her name. Sister drives him to appointments. He uses [facility] and has no issues obtaining medications. No home health prior to admission. He does not use equipment to get around at home but is on 2 L chronic oxygen. He can't remember name of oxygen company but said his sister should know which one if needed. Patient said he was told he should qualify for Medicare with medical issues, getting disability, etc. Sent email to financial counselor to let her know patient was requesting information on applying. Patient said his sister has a card she uses to pay for toilet paper, paper towels, etc but thinks she may get it because she is on Medicare. No further concerns. CSW encouraged patient to contact CSW as needed. CSW will continue to follow patient for support and facilitate return home when stable.                12:14 pm: Development worker, community gave CSW website so patient can apply for Medicare (patient doesn't have a computer) and contact information for a representative with [employer] in [place] that can assist patient with an application. Printed out this information for patient to take home with him.  Expected Discharge Plan: Home/Self Care Barriers to Discharge: Continued Medical Work up   Patient Goals and CMS Choice        Expected Discharge Plan and Services Expected Discharge Plan: Home/Self Care     Post Acute Care Choice: NA Living arrangements for  the past 2 months: Single Family Home                                      Prior Living Arrangements/Services Living arrangements for the past 2 months: Single Family Home Lives with:: Siblings Patient language and need for interpreter reviewed:: Yes Do you feel safe going back to the place where you live?: Yes      Need for Family Participation in Patient Care: Yes (Comment) Care giver support system in place?: Yes (comment) Current home services: DME Criminal Activity/Legal Involvement Pertinent to Current Situation/Hospitalization: No - Comment as needed  Activities of Daily Living Home Assistive Devices/Equipment: None ADL Screening (condition at time of admission) Patient's cognitive ability adequate to safely complete daily activities?: Yes Is the patient deaf or have difficulty hearing?: No Does the patient have difficulty seeing, even when wearing glasses/contacts?: No Does the patient have difficulty concentrating, remembering, or making decisions?: No Patient able to express need for assistance with ADLs?: No Does the patient have difficulty dressing or bathing?: No Independently performs ADLs?: Yes (appropriate for developmental age) Does the patient have difficulty walking or climbing stairs?: No Weakness of Legs: None Weakness of Arms/Hands: None  Permission Sought/Granted Permission sought to share information with : Other (comment) Permission granted to share information with : Yes, Verbal Permission Granted     Permission granted to share info w AGENCY: Financial counselor  Emotional Assessment Appearance:: Appears stated age Attitude/Demeanor/Rapport: Engaged, Gracious Affect (typically observed): Accepting, Appropriate, Calm, Pleasant Orientation: : Oriented to Self, Oriented to Place, Oriented to  Time, Oriented to Situation Alcohol / Substance Use: Not Applicable Psych Involvement: No (comment)  Admission diagnosis:  Respiratory distress  [R06.03] COPD exacerbation ([place]) [J44.1] Patient Active Problem List   Diagnosis Date Noted   Hypocalcemia    Hypokalemia    Glaucoma    Malnutrition of moderate degree 02/20/2021   Chronic respiratory failure (HCC) 02/19/2021   Acute respiratory failure ([place]) 10/06/2020   Tobacco abuse 07/25/2020   HTN (hypertension) 07/25/2020   Acute on chronic respiratory failure with hypercapnia (HCC) 12/06/2019   Nausea vomiting and diarrhea 12/06/2019   Acute respiratory disease due to COVID-19 virus 12/06/2019   COPD (chronic obstructive pulmonary disease) ([place]) 10/30/2019   Hyperglycemia    COPD with acute exacerbation ([place]) 10/29/2019   COPD exacerbation ([place]) 10/29/2019   Epigastric abdominal pain 08/12/2012   Alcohol abuse 03/14/2012   Elevated BP 12/07/2011   ED (erectile dysfunction) 07/28/2011   NICOTINE ADDICTION 10/29/2009   Acute bronchitis 05/07/2009   ERECTILE DYSFUNCTION, NON-ORGANIC 05/04/2009   ANKLE PAIN, RIGHT 10/04/2008   FATIGUE 10/04/2008   ANXIETY 02/17/2008   ASTHMA 02/17/2008   GERD (gastroesophageal reflux disease) 02/17/2008   BPH (benign prostatic hyperplasia) 02/17/2008   PCP:  [facility], [place]:   [place], [place] - [place] [address] Phone: [phone] Fax: [phone]  [place], [place], [place] - [place] [address] Phone: [phone] Fax: [place], [place] [address] Phone: [phone] Fax: [phone]  [place] [address]), [place] - [address] [place]) [place] [address] Phone: [phone] Fax: [phone]     Social Determinants of Health ([place]) Interventions    Readmission Risk Interventions Readmission Risk Prevention Plan 05/10/2021   Transportation Screening Complete  PCP or Specialist Appt within 3-5 Days Complete  Social Work Consult for [place] Planning/Counseling Complete  Palliative Care Screening Not Applicable  Medication Review Press photographer) Complete  Some recent data might be hidden

## 2021-05-10 NOTE — Progress Notes (Signed)
Patient ID: Bryan Kelley, male   DOB: 1956-09-04, 65 y.o.   MRN: [id number] [facility] PROGRESS NOTE  Bryan Kelley [id number] DOB: 1956-09-04 DOA: 05/07/2021 PCP: [facility]  HPI/Subjective: Patient stated he had a rough morning with shortness of breath.  He stated that he could not get his rescue breathing treatment.  He was very upset about this.  This morning he was talking very fast but then went into a coughing fit afterwards.  Still wheezing.  Still short of breath.  Admitted with COPD exacerbation.  Objective: Vitals:   05/10/21 1115 05/10/21 1133  BP:  (!) 157/95  Pulse:  89  Resp:  19  Temp:  98 F (36.7 C)  SpO2: 97% 98%    Intake/Output Summary (Last 24 hours) at 05/10/2021 1315 Last data filed at 05/10/2021 0950 Gross per 24 hour  Intake 1880 ml  Output 1700 ml  Net 180 ml   Filed Weights   05/07/21 0648  Weight: 98 kg    ROS: Review of Systems  Respiratory:  Positive for cough, shortness of breath and wheezing.   Cardiovascular:  Negative for chest pain.  Gastrointestinal:  Negative for abdominal pain, nausea and vomiting.  Exam: Physical Exam HENT:     Head: Normocephalic.     Mouth/Throat:     Pharynx: No oropharyngeal exudate.  Eyes:     General: Lids are normal.     Conjunctiva/sclera: Conjunctivae normal.     Pupils: Pupils are equal, round, and reactive to light.  Cardiovascular:     Rate and Rhythm: Normal rate and regular rhythm.     Heart sounds: Normal heart sounds, S1 normal and S2 normal.  Pulmonary:     Breath sounds: Examination of the right-middle field reveals decreased breath sounds and wheezing. Examination of the left-middle field reveals decreased breath sounds and wheezing. Examination of the right-lower field reveals decreased breath sounds and wheezing. Examination of the left-lower field reveals decreased breath sounds and wheezing. Decreased breath sounds and wheezing present. No rhonchi or rales.   Abdominal:     Palpations: Abdomen is soft.     Tenderness: There is no abdominal tenderness.  Musculoskeletal:     Right lower leg: No swelling.     Left lower leg: No swelling.  Skin:    General: Skin is warm.     Findings: No rash.  Neurological:     Mental Status: He is alert and oriented to person, place, and time.     Data Reviewed: Basic Metabolic Panel: Recent Labs  Lab 05/07/21 0106 05/08/21 0449 05/10/21 0429  NA 141  140 135 133*  K 4.1  3.4* 4.6 4.9  CL 111  104 99 93*  CO2 25  28 31  32  GLUCOSE 147*  136* 255* 327*  BUN 9  11 19  28*  CREATININE 0.64  0.86 0.94 0.93  CALCIUM 6.3*  8.7* 9.4 9.9  MG 1.8 1.9  --    Liver Function Tests: Recent Labs  Lab 05/07/21 0106  AST 27  ALT 26  ALKPHOS 74  BILITOT 0.6  PROT 7.1  ALBUMIN 4.1   CBC: Recent Labs  Lab 05/07/21 0106 05/07/21 1652 05/08/21 0449 05/10/21 0429  WBC 7.9 10.0 17.5* 17.4*  NEUTROABS 3.1  --   --  15.5*  HGB 14.3 13.5 13.3 14.0  HCT 43.6 40.1 39.9 42.8  MCV 91.0 88.7 88.9 90.5  PLT 220 203 207 205   BNP (last 3  results) Recent Labs    09/09/20 0500 11/22/20 0409 05/07/21 0106  BNP 37.4 24.7 17.7     CBG: Recent Labs  Lab 05/10/21 0831 05/10/21 1133  GLUCAP 269* 288*    Recent Results (from the past 240 hour(s))  Resp Panel by RT-PCR (Flu A&B, Covid) Nasopharyngeal Swab     Status: None   Collection Time: 05/07/21  1:06 AM   Specimen: Nasopharyngeal Swab; Nasopharyngeal(NP) swabs in vial transport medium  Result Value Ref Range Status   SARS Coronavirus 2 by RT PCR NEGATIVE NEGATIVE Final    Comment: (NOTE) SARS-CoV-2 target nucleic acids are NOT DETECTED.  The SARS-CoV-2 RNA is generally detectable in upper respiratory specimens during the acute phase of infection. The lowest concentration of SARS-CoV-2 viral copies this assay can detect is 138 copies/mL. A negative result does not preclude SARS-Cov-2 infection and should not be used as the sole basis  for treatment or other patient management decisions. A negative result may occur with  improper specimen collection/handling, submission of specimen other than nasopharyngeal swab, presence of viral mutation(s) within the areas targeted by this assay, and inadequate number of viral copies(<138 copies/mL). A negative result must be combined with clinical observations, patient history, and epidemiological information. The expected result is Negative.  Fact Sheet for Patients:  BloggerCourse.com  Fact Sheet for Healthcare Providers:  SeriousBroker.it  This test is no t yet approved or cleared by the [place] FDA and  has been authorized for detection and/or diagnosis of SARS-CoV-2 by FDA under an Emergency Use Authorization (EUA). This EUA will remain  in effect (meaning this test can be used) for the duration of the COVID-19 declaration under Section 564(b)(1) of the Act, 21 U.S.C.section 360bbb-3(b)(1), unless the authorization is terminated  or revoked sooner.       Influenza A by PCR NEGATIVE NEGATIVE Final   Influenza B by PCR NEGATIVE NEGATIVE Final    Comment: (NOTE) The Xpert Xpress SARS-CoV-2/FLU/RSV plus assay is intended as an aid in the diagnosis of influenza from Nasopharyngeal swab specimens and should not be used as a sole basis for treatment. Nasal washings and aspirates are unacceptable for Xpert Xpress SARS-CoV-2/FLU/RSV testing.  Fact Sheet for Patients: BloggerCourse.com  Fact Sheet for Healthcare Providers: SeriousBroker.it  This test is not yet approved or cleared by the [place] FDA and has been authorized for detection and/or diagnosis of SARS-CoV-2 by FDA under an Emergency Use Authorization (EUA). This EUA will remain in effect (meaning this test can be used) for the duration of the COVID-19 declaration under Section 564(b)(1) of the Act, 21  U.S.C. section 360bbb-3(b)(1), unless the authorization is terminated or revoked.  Performed at [facility], [address] [place], [place] [address]   Culture, blood (Routine X 2) w Reflex to ID Panel     Status: None (Preliminary result)   Collection Time: 05/07/21  5:13 AM   Specimen: BLOOD  Result Value Ref Range Status   Specimen Description BLOOD BLOOD LEFT HAND  Final   Special Requests   Final    BOTTLES DRAWN AEROBIC AND ANAEROBIC Blood Culture adequate volume   Culture  Setup Time   Final    Organism ID to follow GRAM POSITIVE COCCI AEROBIC BOTTLE ONLY CRITICAL RESULT CALLED TO, READ BACK BY AND VERIFIED WITH: SUSANE WATSON AT 1827 ON 05/09/21 BY SS Performed at [facility], [address], [place], [place] [address]    Culture [facility] POSITIVE COCCI  Final   Report  Status PENDING  Incomplete  Blood Culture ID Panel (Reflexed)     Status: Abnormal   Collection Time: 05/07/21  5:13 AM  Result Value Ref Range Status   Enterococcus faecalis NOT DETECTED NOT DETECTED Final   Enterococcus Faecium NOT DETECTED NOT DETECTED Final   Listeria monocytogenes NOT DETECTED NOT DETECTED Final   Staphylococcus species DETECTED (A) NOT DETECTED Final    Comment: CRITICAL RESULT CALLED TO, READ BACK BY AND VERIFIED WITH: SUSANE WATSON AT 1827 ON 05/09/21 BY SS    Staphylococcus aureus (BCID) NOT DETECTED NOT DETECTED Final   Staphylococcus epidermidis NOT DETECTED NOT DETECTED Final   Staphylococcus lugdunensis NOT DETECTED NOT DETECTED Final   Streptococcus species NOT DETECTED NOT DETECTED Final   Streptococcus agalactiae NOT DETECTED NOT DETECTED Final   Streptococcus pneumoniae NOT DETECTED NOT DETECTED Final   Streptococcus pyogenes NOT DETECTED NOT DETECTED Final   A.calcoaceticus-baumannii NOT DETECTED NOT DETECTED Final   Bacteroides fragilis NOT DETECTED NOT DETECTED Final   Enterobacterales NOT DETECTED NOT DETECTED Final   Enterobacter cloacae complex  NOT DETECTED NOT DETECTED Final   Escherichia coli NOT DETECTED NOT DETECTED Final   Klebsiella aerogenes NOT DETECTED NOT DETECTED Final   Klebsiella oxytoca NOT DETECTED NOT DETECTED Final   Klebsiella pneumoniae NOT DETECTED NOT DETECTED Final   Proteus species NOT DETECTED NOT DETECTED Final   Salmonella species NOT DETECTED NOT DETECTED Final   Serratia marcescens NOT DETECTED NOT DETECTED Final   Haemophilus influenzae NOT DETECTED NOT DETECTED Final   Neisseria meningitidis NOT DETECTED NOT DETECTED Final   Pseudomonas aeruginosa NOT DETECTED NOT DETECTED Final   Stenotrophomonas maltophilia NOT DETECTED NOT DETECTED Final   Candida albicans NOT DETECTED NOT DETECTED Final   Candida auris NOT DETECTED NOT DETECTED Final   Candida glabrata NOT DETECTED NOT DETECTED Final   Candida krusei NOT DETECTED NOT DETECTED Final   Candida parapsilosis NOT DETECTED NOT DETECTED Final   Candida tropicalis NOT DETECTED NOT DETECTED Final   Cryptococcus neoformans/gattii NOT DETECTED NOT DETECTED Final    Comment: Performed at [facility], [address] [place], [place] [address]  Culture, blood (Routine X 2) w Reflex to ID Panel     Status: None (Preliminary result)   Collection Time: 05/07/21  5:14 AM   Specimen: BLOOD  Result Value Ref Range Status   Specimen Description BLOOD LEFT ANTECUBITAL  Final   Special Requests   Final    BOTTLES DRAWN AEROBIC AND ANAEROBIC Blood Culture adequate volume   Culture   Final    NO GROWTH 3 DAYS Performed at [facility], [address] [place], [place] [address]    Report Status PENDING  Incomplete     Studies: DG Chest 2 View  Result Date: 05/09/2021 CLINICAL DATA:  Cough EXAM: CHEST - 2 VIEW COMPARISON:  05/07/2021, CT 11/30/2019 FINDINGS: The heart size and mediastinal contours are within normal limits. Both lungs are clear. The visualized skeletal structures are unremarkable. IMPRESSION: No active cardiopulmonary  disease. Electronically Signed   By: Jasmine Pang M.D.   On: 05/09/2021 20:03   ECHOCARDIOGRAM COMPLETE  Result Date: 05/10/2021    ECHOCARDIOGRAM REPORT   Patient Name:   Bryan Kelley Date of Exam: 05/10/2021 Medical Rec #:  [id number]     Height:       71.0 in Accession #:    [id number]    Weight:       216.0 lb Date of Birth:  [birth year]  BSA:          2.179 m Patient Age:    65 years      BP:           158/93 mmHg Patient Gender: M             HR:           85 bpm. Exam Location:  [facility] Procedure: 2D Echo, Cardiac Doppler and Color Doppler Indications:    Bacteremia R78.81  History:        Patient has no prior history of Echocardiogram examinations.                 COPD.  Sonographer:    Cristela Blue RDCS (AE) Referring Phys: [id number] Alford [place] Diagnosing      Arnoldo Hooker MD Phys:  Sonographer Comments: Technically challenging study due to limited acoustic windows and no parasternal window. Image acquisition challenging due to COPD. IMPRESSIONS  1. Left ventricular ejection fraction, by estimation, is 60 to 65%. The left ventricle has normal function. The left ventricle has no regional wall motion abnormalities. Left ventricular diastolic parameters were normal.  2. Right ventricular systolic function is normal. The right ventricular size is normal.  3. The mitral valve is normal in structure. Trivial mitral valve regurgitation.  4. The aortic valve is normal in structure. Aortic valve regurgitation is trivial. FINDINGS  Left Ventricle: Left ventricular ejection fraction, by estimation, is 60 to 65%. The left ventricle has normal function. The left ventricle has no regional wall motion abnormalities. The left ventricular internal cavity size was small. There is no left ventricular hypertrophy. Left ventricular diastolic parameters were normal. Right Ventricle: The right ventricular size is normal. No increase in right ventricular wall thickness. Right ventricular systolic function is normal. Left  Atrium: Left atrial size was normal in size. Right Atrium: Right atrial size was normal in size. Pericardium: There is no evidence of pericardial effusion. Mitral Valve: The mitral valve is normal in structure. Trivial mitral valve regurgitation. Tricuspid Valve: The tricuspid valve is normal in structure. Tricuspid valve regurgitation is trivial. Aortic Valve: The aortic valve is normal in structure. Aortic valve regurgitation is trivial. Aortic valve mean gradient measures 3.0 mmHg. Aortic valve peak gradient measures 5.7 mmHg. Aortic valve area, by VTI measures 3.11 cm. Pulmonic Valve: The pulmonic valve was normal in structure. Pulmonic valve regurgitation is not visualized. Aorta: The aortic root and ascending aorta are structurally normal, with no evidence of dilitation. IAS/Shunts: No atrial level shunt detected by color flow Doppler.  LEFT VENTRICLE PLAX 2D LVIDd:         4.09 cm  Diastology LVIDs:         2.59 cm  LV e' medial:    6.96 cm/s LV PW:         0.97 cm  LV E/e' medial:  9.1 LV IVS:        0.84 cm  LV e' lateral:   7.07 cm/s LVOT diam:     2.00 cm  LV E/e' lateral: 9.0 LV SV:         68 LV SV Index:   31 LVOT Area:     3.14 cm  RIGHT VENTRICLE RV Basal diam:  3.84 cm LEFT ATRIUM             Index       RIGHT ATRIUM           Index LA diam:  3.20 cm 1.47 cm/m  RA Area:     16.80 cm LA Vol (A2C):   25.1 ml 11.52 ml/m RA Volume:   44.60 ml  20.47 ml/m LA Vol (A4C):   20.6 ml 9.45 ml/m LA Biplane Vol: 22.3 ml 10.23 ml/m  AORTIC VALVE AV Area (Vmax):    2.77 cm AV Area (Vmean):   2.95 cm AV Area (VTI):     3.11 cm AV Vmax:           119.00 cm/s AV Vmean:          83.800 cm/s AV VTI:            0.220 m AV Peak Grad:      5.7 mmHg AV Mean Grad:      3.0 mmHg LVOT Vmax:         105.00 cm/s LVOT Vmean:        78.600 cm/s LVOT VTI:          0.218 m LVOT/AV VTI ratio: 0.99 MITRAL VALVE               TRICUSPID VALVE MV Area (PHT): 3.68 cm    TR Peak grad:   11.0 mmHg MV Decel Time: 206 msec     TR Vmax:        166.00 cm/s MV E velocity: 63.40 cm/s MV A velocity: 75.40 cm/s  SHUNTS MV E/A ratio:  0.84        Systemic VTI:  0.22 m                            Systemic Diam: 2.00 cm Arnoldo Hooker MD Electronically signed by Arnoldo Hooker MD Signature Date/Time: 05/10/2021/1:02:34 PM    Final     Scheduled Meds:  azithromycin  500 mg Oral Daily   brinzolamide  1 drop Left Eye TID   And   brimonidine  1 drop Left Eye TID   calcium-vitamin D  1 tablet Oral TID   enoxaparin (LOVENOX) injection  40 mg Subcutaneous Q24H   Ensure  237 mL Oral BID BM   guaiFENesin  600 mg Oral BID   insulin aspart  0-5 Units Subcutaneous QHS   insulin aspart  0-9 Units Subcutaneous TID WC   insulin aspart  3 Units Subcutaneous TID WC   insulin glargine  8 Units Subcutaneous Daily   ipratropium-albuterol  3 mL Nebulization Q4H   magnesium oxide  400 mg Oral Daily   methylPREDNISolone (SOLU-MEDROL) injection  60 mg Intravenous Q12H   mometasone-formoterol  2 puff Inhalation BID   montelukast  10 mg Oral Daily   pantoprazole  20 mg Oral Daily   tamsulosin  0.4 mg Oral Daily   theophylline  200 mg Oral Q12H   Continuous Infusions:  vancomycin 1,250 mg (05/10/21 0839)    Assessment/Plan:  COPD with diffuse wheezing.  Continue Solu-Medrol twice a day dosing.  Continue Zithromax 5 days.  Continue nebulizer treatments and Dulera inhaler.  Very slow to progress.  Add theophylline this evening. Chronic hypoxic respiratory failure secondary to COPD on chronic 2 L of oxygen Blood culture with staph species likely contaminant.  On vancomycin for right now.  Hopefully will be able to discontinue.  Repeat blood cultures this morning.  Echocardiogram normal. Hypokalemia and hypocalcemia.  Both replaced into the normal range BPH on Flomax GERD on Protonix Glaucoma continue eyedrops Anxiety on Xanax   Code Status:  Code Status Orders  (From admission, onward)           Start     Ordered   05/07/21  0334  Full code  Continuous        05/07/21 0335           Code Status History     Date Active Date Inactive Code Status Order ID Comments User Context   02/19/2021 1003 02/22/2021 1707 Full Code [id number]  Lucile Shutters, MD ED   11/22/2020 0529 11/24/2020 1931 Full Code [id number]  Andris Baumann, MD ED   10/06/2020 0018 10/07/2020 2211 Full Code [id number]  Mansy, Vernetta Honey, MD ED   09/09/2020 0748 09/12/2020 1706 Full Code [id number]  Lorretta Harp, MD ED   07/25/2020 1050 07/28/2020 2206 Full Code [id number]  Lorretta Harp, MD ED   12/06/2019 1531 12/11/2019 1709 Full Code [id number]  Lorretta Harp, MD ED   10/29/2019 2105 11/01/2019 1608 Full Code [id number]  Andris Baumann, MD ED      Family Communication: Spoke with sister on the phone. Disposition Plan: Status is: Inpatient  Dispo: The patient is from: Home              Anticipated d/c is to: Home              Patient currently still with quite a bit of bronchospasm.  Not ready for disposition yet   Difficult to place patient.  No.  Antibiotics: Zithromax for COPD exacerbation Vancomycin for positive blood culture likely contamination.  Time spent: 28 minutes, case discussed in rounds about his rough night and morning.  Nursing staff to speak with him.  Ozzie Knobel [employer]  [facility] [employer]

## 2021-05-11 LAB — GLUCOSE, CAPILLARY
Glucose-Capillary: 187 mg/dL — ABNORMAL HIGH (ref 70–99)
Glucose-Capillary: 165 mg/dL — ABNORMAL HIGH (ref 70–99)
Glucose-Capillary: 191 mg/dL — ABNORMAL HIGH (ref 70–99)
Glucose-Capillary: 290 mg/dL — ABNORMAL HIGH (ref 70–99)

## 2021-05-11 LAB — HEMOGLOBIN A1C
Hgb A1c MFr Bld: 6.6 % — ABNORMAL HIGH (ref 4.8–5.6)
Mean Plasma Glucose: 142.72 mg/dL

## 2021-05-11 MED ORDER — METHYLPREDNISOLONE SODIUM SUCC 40 MG IJ SOLR
40.0000 mg | Freq: Two times a day (BID) | INTRAMUSCULAR | Status: DC
  Administered 2021-05-11 – 2021-05-13 (×4): 40 mg via INTRAVENOUS
  Filled 2021-05-11 (×3): qty 1

## 2021-05-11 NOTE — Progress Notes (Signed)
Mobility Specialist - Progress Note   05/11/21 1700  Mobility  Activity Ambulated in hall  Level of Assistance Independent  Assistive Device None  Distance Ambulated (ft) 350 ft  Mobility Ambulated independently in hallway  Mobility Response Tolerated well  Mobility performed by Mobility specialist  $Mobility charge 1 Mobility    Pre-mobility: 81 HR, 96% SpO2 During mobility: 90 HR, 91% SpO2 Post-mobility: 84 HR, 94% SPO2   Pt ambulated in hallway independently, no AD. No LOB. Utilizing 2L then weaned to RA. Sats maintaining >/= 89% throughout activity, except for last 10' where it desat to a low of 87%. Pt back on 2L prior to exit.    Filiberto Pinks Mobility Specialist 05/11/21, 5:29 PM

## 2021-05-11 NOTE — Progress Notes (Addendum)
Patient ID: Bryan Kelley, male   DOB: 05/23/1956, 65 y.o.   MRN: [id number] [facility] PROGRESS NOTE  Bryan Kelley NKN:[id number] DOB: [birth year] DOA: 05/07/2021 PCP: [name]  HPI/Subjective: Patient admitted with COPD exacerbation.  Still having cough shortness of breath and wheezing.  Feels better than when he came in but not quite back to normal.  Objective: Vitals:   05/11/21 0739 05/11/21 0900  BP:  (!) 155/99  Pulse:  81  Resp:  18  Temp:  97.9 F (36.6 C)  SpO2: 100% 100%    Intake/Output Summary (Last 24 hours) at 05/11/2021 1451 Last data filed at 05/11/2021 1351 Gross per 24 hour  Intake 600 ml  Output --  Net 600 ml   Filed Weights   05/07/21 0648  Weight: 98 kg    ROS: Review of Systems  Respiratory:  Positive for cough and shortness of breath.   Cardiovascular:  Negative for chest pain.  Gastrointestinal:  Negative for abdominal pain, nausea and vomiting.  Exam: Physical Exam HENT:     Head: Normocephalic.     Mouth/Throat:     Pharynx: No oropharyngeal exudate.  Eyes:     General: Lids are normal.     Conjunctiva/sclera: Conjunctivae normal.     Pupils: Pupils are equal, round, and reactive to light.  Cardiovascular:     Rate and Rhythm: Normal rate and regular rhythm.     Heart sounds: Normal heart sounds, S1 normal and S2 normal.  Pulmonary:     Breath sounds: Transmitted upper airway sounds present. Examination of the right-lower field reveals decreased breath sounds. Examination of the left-lower field reveals decreased breath sounds. Decreased breath sounds present. No rhonchi or rales.     Comments: Transmitted upper airway wheeze. Musculoskeletal:     Right ankle: No swelling.     Left ankle: No swelling.  Skin:    General: Skin is warm.     Findings: No rash.  Neurological:     Mental Status: He is alert and oriented to person, place, and time.     Data Reviewed: Basic Metabolic Panel: Recent Labs   Lab 05/07/21 0106 05/08/21 0449 05/10/21 0429  NA 141  140 135 133*  K 4.1  3.4* 4.6 4.9  CL 111  104 99 93*  CO2 25  28 31  32  GLUCOSE 147*  136* 255* 327*  BUN 9  11 19  28*  CREATININE 0.64  0.86 0.94 0.93  CALCIUM 6.3*  8.7* 9.4 9.9  MG 1.8 1.9  --    Liver Function Tests: Recent Labs  Lab 05/07/21 0106  AST 27  ALT 26  ALKPHOS 74  BILITOT 0.6  PROT 7.1  ALBUMIN 4.1   CBC: Recent Labs  Lab 05/07/21 0106 05/07/21 1652 05/08/21 0449 05/10/21 0429  WBC 7.9 10.0 17.5* 17.4*  NEUTROABS 3.1  --   --  15.5*  HGB 14.3 13.5 13.3 14.0  HCT 43.6 40.1 39.9 42.8  MCV 91.0 88.7 88.9 90.5  PLT 220 203 207 205   BNP (last 3 results) Recent Labs    09/09/20 0500 11/22/20 0409 05/07/21 0106  BNP 37.4 24.7 17.7     CBG: Recent Labs  Lab 05/10/21 1133 05/10/21 1627 05/10/21 2203 05/11/21 0737 05/11/21 1125  GLUCAP 288* 168* 297* 187* 165*    Recent Results (from the past 240 hour(s))  Resp Panel by RT-PCR (Flu A&B, Covid) Nasopharyngeal Swab     Status: None  Collection Time: 05/07/21  1:06 AM   Specimen: Nasopharyngeal Swab; Nasopharyngeal(NP) swabs in vial transport medium  Result Value Ref Range Status   SARS Coronavirus 2 by RT PCR NEGATIVE NEGATIVE Final    Comment: (NOTE) SARS-CoV-2 target nucleic acids are NOT DETECTED.  The SARS-CoV-2 RNA is generally detectable in upper respiratory specimens during the acute phase of infection. The lowest concentration of SARS-CoV-2 viral copies this assay can detect is 138 copies/mL. A negative result does not preclude SARS-Cov-2 infection and should not be used as the sole basis for treatment or other patient management decisions. A negative result may occur with  improper specimen collection/handling, submission of specimen other than nasopharyngeal swab, presence of viral mutation(s) within the areas targeted by this assay, and inadequate number of viral copies(<138 copies/mL). A negative result  must be combined with clinical observations, patient history, and epidemiological information. The expected result is Negative.  Fact Sheet for Patients:  BloggerCourse.comhttps://www.fda.gov/media/152166/download  Fact Sheet for Healthcare Providers:  SeriousBroker.ithttps://www.fda.gov/media/152162/download  This test is no t yet approved or cleared by the [place] FDA and  has been authorized for detection and/or diagnosis of SARS-CoV-2 by FDA under an Emergency Use Authorization (EUA). This EUA will remain  in effect (meaning this test can be used) for the duration of the COVID-19 declaration under Section 564(b)(1) of the Act, 21 U.S.C.section 360bbb-3(b)(1), unless the authorization is terminated  or revoked sooner.       Influenza A by PCR NEGATIVE NEGATIVE Final   Influenza B by PCR NEGATIVE NEGATIVE Final    Comment: (NOTE) The Xpert Xpress SARS-CoV-2/FLU/RSV plus assay is intended as an aid in the diagnosis of influenza from Nasopharyngeal swab specimens and should not be used as a sole basis for treatment. Nasal washings and aspirates are unacceptable for Xpert Xpress SARS-CoV-2/FLU/RSV testing.  Fact Sheet for Patients: BloggerCourse.comhttps://www.fda.gov/media/152166/download  Fact Sheet for Healthcare Providers: SeriousBroker.ithttps://www.fda.gov/media/152162/download  This test is not yet approved or cleared by the [place] FDA and has been authorized for detection and/or diagnosis of SARS-CoV-2 by FDA under an Emergency Use Authorization (EUA). This EUA will remain in effect (meaning this test can be used) for the duration of the COVID-19 declaration under Section 564(b)(1) of the Act, 21 U.S.C. section 360bbb-3(b)(1), unless the authorization is terminated or revoked.  Performed at [facility], [address] [place], [place] [address]   Culture, blood (Routine X 2) w Reflex to ID Panel     Status: Abnormal (Preliminary result)   Collection Time: 05/07/21  5:13 AM   Specimen: BLOOD  Result  Value Ref Range Status   Specimen Description   Final    BLOOD BLOOD LEFT HAND Performed at [facility], [address] [place], [place] [address]    Special Requests   Final    BOTTLES DRAWN AEROBIC AND ANAEROBIC Blood Culture adequate volume Performed at [facility], [address] [place], [place] [address]    Culture  Setup Time   Final    Organism ID to follow GRAM POSITIVE COCCI AEROBIC BOTTLE ONLY CRITICAL RESULT CALLED TO, READ BACK BY AND VERIFIED WITH: SUSANE WATSON AT 1827 ON 05/09/21 BY SS Performed at [facility], [address] [place], [place] [address]    Culture (A)  Final    STAPHYLOCOCCUS CAPITIS THE SIGNIFICANCE OF ISOLATING THIS ORGANISM FROM A SINGLE SET OF BLOOD CULTURES WHEN MULTIPLE SETS ARE DRAWN IS UNCERTAIN. PLEASE NOTIFY THE MICROBIOLOGY DEPARTMENT WITHIN ONE WEEK IF SPECIATION AND SENSITIVITIES  ARE REQUIRED. Performed at [facility], [address], [place], [place] [address]    Report Status PENDING  Incomplete  Blood Culture ID Panel (Reflexed)     Status: Abnormal   Collection Time: 05/07/21  5:13 AM  Result Value Ref Range Status   Enterococcus faecalis NOT DETECTED NOT DETECTED Final   Enterococcus Faecium NOT DETECTED NOT DETECTED Final   Listeria monocytogenes NOT DETECTED NOT DETECTED Final   Staphylococcus species DETECTED (A) NOT DETECTED Final    Comment: CRITICAL RESULT CALLED TO, READ BACK BY AND VERIFIED WITH: SUSANE WATSON AT 1827 ON 05/09/21 BY SS    Staphylococcus aureus (BCID) NOT DETECTED NOT DETECTED Final   Staphylococcus epidermidis NOT DETECTED NOT DETECTED Final   Staphylococcus lugdunensis NOT DETECTED NOT DETECTED Final   Streptococcus species NOT DETECTED NOT DETECTED Final   Streptococcus agalactiae NOT DETECTED NOT DETECTED Final   Streptococcus pneumoniae NOT DETECTED NOT DETECTED Final   Streptococcus pyogenes NOT DETECTED NOT DETECTED Final   A.calcoaceticus-baumannii NOT  DETECTED NOT DETECTED Final   Bacteroides fragilis NOT DETECTED NOT DETECTED Final   Enterobacterales NOT DETECTED NOT DETECTED Final   Enterobacter cloacae complex NOT DETECTED NOT DETECTED Final   Escherichia coli NOT DETECTED NOT DETECTED Final   Klebsiella aerogenes NOT DETECTED NOT DETECTED Final   Klebsiella oxytoca NOT DETECTED NOT DETECTED Final   Klebsiella pneumoniae NOT DETECTED NOT DETECTED Final   Proteus species NOT DETECTED NOT DETECTED Final   Salmonella species NOT DETECTED NOT DETECTED Final   Serratia marcescens NOT DETECTED NOT DETECTED Final   Haemophilus influenzae NOT DETECTED NOT DETECTED Final   Neisseria meningitidis NOT DETECTED NOT DETECTED Final   Pseudomonas aeruginosa NOT DETECTED NOT DETECTED Final   Stenotrophomonas maltophilia NOT DETECTED NOT DETECTED Final   Candida albicans NOT DETECTED NOT DETECTED Final   Candida auris NOT DETECTED NOT DETECTED Final   Candida glabrata NOT DETECTED NOT DETECTED Final   Candida krusei NOT DETECTED NOT DETECTED Final   Candida parapsilosis NOT DETECTED NOT DETECTED Final   Candida tropicalis NOT DETECTED NOT DETECTED Final   Cryptococcus neoformans/gattii NOT DETECTED NOT DETECTED Final    Comment: Performed at [facility], [address] [place], [place] [address]  Culture, blood (Routine X 2) w Reflex to ID Panel     Status: None (Preliminary result)   Collection Time: 05/07/21  5:14 AM   Specimen: BLOOD  Result Value Ref Range Status   Specimen Description BLOOD LEFT ANTECUBITAL  Final   Special Requests   Final    BOTTLES DRAWN AEROBIC AND ANAEROBIC Blood Culture adequate volume   Culture   Final    NO GROWTH 4 DAYS Performed at [facility], [address] [place], [place] [address]    Report Status PENDING  Incomplete  CULTURE, BLOOD (ROUTINE X 2) w Reflex to ID Panel     Status: None (Preliminary result)   Collection Time: 05/10/21  4:29 AM   Specimen: BLOOD  Result Value Ref  Range Status   Specimen Description BLOOD LEFT ANTECUBITAL  Final   Special Requests   Final    BOTTLES DRAWN AEROBIC AND ANAEROBIC Blood Culture adequate volume   Culture   Final    NO GROWTH 1 DAY Performed at [facility], [address] [place], [place] [address]    Report Status PENDING  Incomplete  CULTURE, BLOOD (ROUTINE X 2) w Reflex to ID Panel     Status: None (  Preliminary result)   Collection Time: 05/10/21  4:31 AM   Specimen: BLOOD  Result Value Ref Range Status   Specimen Description BLOOD BLOOD LEFT FOREARM  Final   Special Requests   Final    BOTTLES DRAWN AEROBIC AND ANAEROBIC Blood Culture adequate volume   Culture   Final    NO GROWTH 1 DAY Performed at [facility], [address], [place], [place] [address]    Report Status PENDING  Incomplete     Studies: DG Chest 2 View  Result Date: 05/09/2021 CLINICAL DATA:  Cough EXAM: CHEST - 2 VIEW COMPARISON:  05/07/2021, CT 11/30/2019 FINDINGS: The heart size and mediastinal contours are within normal limits. Both lungs are clear. The visualized skeletal structures are unremarkable. IMPRESSION: No active cardiopulmonary disease. Electronically Signed   By: Jasmine Pang M.D.   On: 05/09/2021 20:03   ECHOCARDIOGRAM COMPLETE  Result Date: 05/10/2021    ECHOCARDIOGRAM REPORT   Patient Name:   Bryan Kelley Date of Exam: 05/10/2021 Medical Rec #:  [id number]     Height:       71.0 in Accession #:    [id number]    Weight:       216.0 lb Date of Birth:  [birth year]     BSA:          2.179 m Patient Age:    65 years      BP:           158/93 mmHg Patient Gender: M             HR:           85 bpm. Exam Location:  [facility] Procedure: 2D Echo, Cardiac Doppler and Color Doppler Indications:    Bacteremia R78.81  History:        Patient has no prior history of Echocardiogram examinations.                 COPD.  Sonographer:    Cristela Blue RDCS (AE) Referring Phys: [id number] Alford Highland Diagnosing      Arnoldo Hooker MD  Phys:  Sonographer Comments: Technically challenging study due to limited acoustic windows and no parasternal window. Image acquisition challenging due to COPD. IMPRESSIONS  1. Left ventricular ejection fraction, by estimation, is 60 to 65%. The left ventricle has normal function. The left ventricle has no regional wall motion abnormalities. Left ventricular diastolic parameters were normal.  2. Right ventricular systolic function is normal. The right ventricular size is normal.  3. The mitral valve is normal in structure. Trivial mitral valve regurgitation.  4. The aortic valve is normal in structure. Aortic valve regurgitation is trivial. FINDINGS  Left Ventricle: Left ventricular ejection fraction, by estimation, is 60 to 65%. The left ventricle has normal function. The left ventricle has no regional wall motion abnormalities. The left ventricular internal cavity size was small. There is no left ventricular hypertrophy. Left ventricular diastolic parameters were normal. Right Ventricle: The right ventricular size is normal. No increase in right ventricular wall thickness. Right ventricular systolic function is normal. Left Atrium: Left atrial size was normal in size. Right Atrium: Right atrial size was normal in size. Pericardium: There is no evidence of pericardial effusion. Mitral Valve: The mitral valve is normal in structure. Trivial mitral valve regurgitation. Tricuspid Valve: The tricuspid valve is normal in structure. Tricuspid valve regurgitation is trivial. Aortic Valve: The aortic valve is normal in structure. Aortic valve regurgitation is trivial. Aortic valve mean gradient measures  3.0 mmHg. Aortic valve peak gradient measures 5.7 mmHg. Aortic valve area, by VTI measures 3.11 cm. Pulmonic Valve: The pulmonic valve was normal in structure. Pulmonic valve regurgitation is not visualized. Aorta: The aortic root and ascending aorta are structurally normal, with no evidence of dilitation. IAS/Shunts: No  atrial level shunt detected by color flow Doppler.  LEFT VENTRICLE PLAX 2D LVIDd:         4.09 cm  Diastology LVIDs:         2.59 cm  LV e' medial:    6.96 cm/s LV PW:         0.97 cm  LV E/e' medial:  9.1 LV IVS:        0.84 cm  LV e' lateral:   7.07 cm/s LVOT diam:     2.00 cm  LV E/e' lateral: 9.0 LV SV:         68 LV SV Index:   31 LVOT Area:     3.14 cm  RIGHT VENTRICLE RV Basal diam:  3.84 cm LEFT ATRIUM             Index       RIGHT ATRIUM           Index LA diam:        3.20 cm 1.47 cm/m  RA Area:     16.80 cm LA Vol (A2C):   25.1 ml 11.52 ml/m RA Volume:   44.60 ml  20.47 ml/m LA Vol (A4C):   20.6 ml 9.45 ml/m LA Biplane Vol: 22.3 ml 10.23 ml/m  AORTIC VALVE AV Area (Vmax):    2.77 cm AV Area (Vmean):   2.95 cm AV Area (VTI):     3.11 cm AV Vmax:           119.00 cm/s AV Vmean:          83.800 cm/s AV VTI:            0.220 m AV Peak Grad:      5.7 mmHg AV Mean Grad:      3.0 mmHg LVOT Vmax:         105.00 cm/s LVOT Vmean:        78.600 cm/s LVOT VTI:          0.218 m LVOT/AV VTI ratio: 0.99 MITRAL VALVE               TRICUSPID VALVE MV Area (PHT): 3.68 cm    TR Peak grad:   11.0 mmHg MV Decel Time: 206 msec    TR Vmax:        166.00 cm/s MV E velocity: 63.40 cm/s MV A velocity: 75.40 cm/s  SHUNTS MV E/A ratio:  0.84        Systemic VTI:  0.22 m                            Systemic Diam: 2.00 cm Arnoldo Hooker MD Electronically signed by Arnoldo Hooker MD Signature Date/Time: 05/10/2021/1:02:34 PM    Final     Scheduled Meds:  brinzolamide  1 drop Left Eye TID   And   brimonidine  1 drop Left Eye TID   calcium-vitamin D  1 tablet Oral TID   enoxaparin (LOVENOX) injection  40 mg Subcutaneous Q24H   Ensure  237 mL Oral BID BM   guaiFENesin  600 mg Oral BID   insulin aspart  0-5 Units Subcutaneous QHS  insulin aspart  0-9 Units Subcutaneous TID WC   insulin aspart  3 Units Subcutaneous TID WC   insulin glargine  8 Units Subcutaneous Daily   ipratropium-albuterol  3 mL Nebulization Q4H    magnesium oxide  400 mg Oral Daily   methylPREDNISolone (SOLU-MEDROL) injection  60 mg Intravenous Q12H   mometasone-formoterol  2 puff Inhalation BID   montelukast  10 mg Oral Daily   pantoprazole  20 mg Oral Daily   tamsulosin  0.4 mg Oral Daily   theophylline  400 mg Oral QHS    Assessment/Plan:  COPD exacerbation.  Patient moving better air today.  Now has upper airway transmitted wheeze rather than wheezing throughout the entire lung field.  Improvement since when he came in but still will require another day here in the hospital.  Continue Solu-Medrol 40 mg twice a day.  5 days of Zithromax.  Continue nebulizer treatments Dulera inhaler.  Added theophylline last night. Chronic hypoxic respiratory failure secondary to COPD exacerbation on chronic 2 L of oxygen Blood culture with staph capitis which is a contaminant.  Echocardiogram normal. Repeat blood cultures negative. Hypokalemia and hypocalcemia on presentation.  Lites replaced into the normal range BPH on Flomax GERD.  Continue Protonix Glaucoma on eyedrops Anxiety on Xanax    Code Status:     Code Status Orders  (From admission, onward)           Start     Ordered   05/07/21 0334  Full code  Continuous        05/07/21 0335           Code Status History     Date Active Date Inactive Code Status Order ID Comments User Context   02/19/2021 1003 02/22/2021 1707 Full Code [id number]  Lucile Shutters, MD ED   11/22/2020 0529 11/24/2020 1931 Full Code [id number]  Andris Baumann, MD ED   10/06/2020 0018 10/07/2020 2211 Full Code [id number]  Mansy, Vernetta Honey, MD ED   09/09/2020 0748 09/12/2020 1706 Full Code [id number]  Lorretta Harp, MD ED   07/25/2020 1050 07/28/2020 2206 Full Code [id number]  Lorretta Harp, MD ED   12/06/2019 1531 12/11/2019 1709 Full Code [id number]  Lorretta Harp, MD ED   10/29/2019 2105 11/01/2019 1608 Full Code [id number]  Andris Baumann, MD ED      Family Communication: Spoke to patient's sister on the  phone Disposition Plan: Status is: Inpatient  Dispo: The patient is from: Home              Anticipated d/c is to: Home              Patient currently still with some upper airway wheezing.  Improved since coming in hopefully disposition home tomorrow.   Difficult to place patient.  No.  Time spent: 27 minutes  [employer]

## 2021-05-11 NOTE — Plan of Care (Signed)

## 2021-05-12 LAB — BASIC METABOLIC PANEL
Anion gap: 4 — ABNORMAL LOW (ref 5–15)
BUN: 30 mg/dL — ABNORMAL HIGH (ref 8–23)
CO2: 36 mmol/L — ABNORMAL HIGH (ref 22–32)
Calcium: 9.1 mg/dL (ref 8.9–10.3)
Chloride: 95 mmol/L — ABNORMAL LOW (ref 98–111)
Creatinine, Ser: 0.92 mg/dL (ref 0.61–1.24)
GFR, Estimated: >60 mL/min (ref >60)
Glucose, Bld: 237 mg/dL — ABNORMAL HIGH (ref 70–99)
Potassium: 4.8 mmol/L (ref 3.5–5.1)
Sodium: 135 mmol/L (ref 135–145)

## 2021-05-12 LAB — CULTURE, BLOOD (ROUTINE X 2)
Culture: NO GROWTH
Special Requests: ADEQUATE
Special Requests: ADEQUATE

## 2021-05-12 LAB — GLUCOSE, CAPILLARY
Glucose-Capillary: 146 mg/dL — ABNORMAL HIGH (ref 70–99)
Glucose-Capillary: 158 mg/dL — ABNORMAL HIGH (ref 70–99)
Glucose-Capillary: 108 mg/dL — ABNORMAL HIGH (ref 70–99)
Glucose-Capillary: 242 mg/dL — ABNORMAL HIGH (ref 70–99)

## 2021-05-12 MED ORDER — ACETYLCYSTEINE 20 % IN SOLN
4.0000 mL | Freq: Two times a day (BID) | RESPIRATORY_TRACT | Status: DC
  Administered 2021-05-12 – 2021-05-13 (×3): 4 mL via RESPIRATORY_TRACT
  Filled 2021-05-12 (×4): qty 4

## 2021-05-12 MED ORDER — IPRATROPIUM-ALBUTEROL 0.5-2.5 (3) MG/3ML IN SOLN: 3.0000 mL | RESPIRATORY_TRACT | Status: DC | PRN

## 2021-05-12 MED ORDER — IPRATROPIUM-ALBUTEROL 0.5-2.5 (3) MG/3ML IN SOLN
3.0000 mL | Freq: Four times a day (QID) | RESPIRATORY_TRACT | Status: DC
  Administered 2021-05-13: 3 mL via RESPIRATORY_TRACT
  Filled 2021-05-12 (×2): qty 3

## 2021-05-12 MED ORDER — CITALOPRAM HYDROBROMIDE 20 MG PO TABS
10.0000 mg | ORAL_TABLET | Freq: Every day | ORAL | Status: DC
  Administered 2021-05-12: 10 mg via ORAL
  Filled 2021-05-12: qty 1

## 2021-05-12 NOTE — Plan of Care (Signed)

## 2021-05-12 NOTE — Progress Notes (Signed)
Patient ID: Bryan Kelley, male   DOB: 1955-11-12, 65 y.o.   MRN: [id number] [facility] PROGRESS NOTE  Bryan Kelley:[id number] DOB: Oct 11, 1956 DOA: 05/07/2021 PCP: [facility]  HPI/Subjective: Patient still wheezing and coughing.  Still having shortness of breath.  Admitted with COPD exacerbation.  Feels like his breathing is better than when he came in but not well enough to go home at this point.  Still gasping for breath when coming back from the bathroom.  Objective: Vitals:   05/12/21 0458 05/12/21 0815  BP: (!) 161/90   Pulse: 76   Resp: 20   Temp: 97.9 F (36.6 C)   SpO2: 100% 95%    Intake/Output Summary (Last 24 hours) at 05/12/2021 1438 Last data filed at 05/12/2021 1401 Gross per 24 hour  Intake 600 ml  Output --  Net 600 ml   Filed Weights   05/07/21 0648  Weight: 98 kg    ROS: Review of Systems  Respiratory:  Positive for cough and shortness of breath.   Cardiovascular:  Negative for chest pain.  Gastrointestinal:  Negative for abdominal pain, nausea and vomiting.  Exam: Physical Exam HENT:     Head: Normocephalic.     Mouth/Throat:     Pharynx: No oropharyngeal exudate.  Eyes:     General: Lids are normal.     Conjunctiva/sclera: Conjunctivae normal.  Cardiovascular:     Rate and Rhythm: Normal rate and regular rhythm.     Heart sounds: Normal heart sounds, S1 normal and S2 normal.  Pulmonary:     Breath sounds: Examination of the right-middle field reveals decreased breath sounds and wheezing. Examination of the left-middle field reveals decreased breath sounds and wheezing. Examination of the right-lower field reveals decreased breath sounds and wheezing. Examination of the left-lower field reveals decreased breath sounds and wheezing. Decreased breath sounds and wheezing present. No rhonchi or rales.  Abdominal:     Palpations: Abdomen is soft.     Tenderness: There is no abdominal tenderness.  Musculoskeletal:      Right lower leg: No swelling.     Left lower leg: No swelling.  Skin:    General: Skin is warm.     Findings: No rash.  Neurological:     Mental Status: He is alert and oriented to person, place, and time.     Data Reviewed: Basic Metabolic Panel: Recent Labs  Lab 05/07/21 0106 05/08/21 0449 05/10/21 0429 05/12/21 0407  NA 141  140 135 133* 135  K 4.1  3.4* 4.6 4.9 4.8  CL 111  104 99 93* 95*  CO2 25  28 31  32 36*  GLUCOSE 147*  136* 255* 327* 237*  BUN 9  11 19  28* 30*  CREATININE 0.64  0.86 0.94 0.93 0.92  CALCIUM 6.3*  8.7* 9.4 9.9 9.1  MG 1.8 1.9  --   --    Liver Function Tests: Recent Labs  Lab 05/07/21 0106  AST 27  ALT 26  ALKPHOS 74  BILITOT 0.6  PROT 7.1  ALBUMIN 4.1   CBC: Recent Labs  Lab 05/07/21 0106 05/07/21 1652 05/08/21 0449 05/10/21 0429  WBC 7.9 10.0 17.5* 17.4*  NEUTROABS 3.1  --   --  15.5*  HGB 14.3 13.5 13.3 14.0  HCT 43.6 40.1 39.9 42.8  MCV 91.0 88.7 88.9 90.5  PLT 220 203 207 205    BNP (last 3 results) Recent Labs    09/09/20 0500 11/22/20 0409 05/07/21  0106  BNP 37.4 24.7 17.7     CBG: Recent Labs  Lab 05/11/21 1125 05/11/21 1634 05/11/21 2138 05/12/21 0747 05/12/21 1129  GLUCAP 165* 191* 290* 146* 158*    Recent Results (from the past 240 hour(s))  Resp Panel by RT-PCR (Flu A&B, Covid) Nasopharyngeal Swab     Status: None   Collection Time: 05/07/21  1:06 AM   Specimen: Nasopharyngeal Swab; Nasopharyngeal(NP) swabs in vial transport medium  Result Value Ref Range Status   SARS Coronavirus 2 by RT PCR NEGATIVE NEGATIVE Final    Comment: (NOTE) SARS-CoV-2 target nucleic acids are NOT DETECTED.  The SARS-CoV-2 RNA is generally detectable in upper respiratory specimens during the acute phase of infection. The lowest concentration of SARS-CoV-2 viral copies this assay can detect is 138 copies/mL. A negative result does not preclude SARS-Cov-2 infection and should not be used as the sole basis for  treatment or other patient management decisions. A negative result may occur with  improper specimen collection/handling, submission of specimen other than nasopharyngeal swab, presence of viral mutation(s) within the areas targeted by this assay, and inadequate number of viral copies(<138 copies/mL). A negative result must be combined with clinical observations, patient history, and epidemiological information. The expected result is Negative.  Fact Sheet for Patients:  BloggerCourse.com  Fact Sheet for Healthcare Providers:  SeriousBroker.it  This test is no t yet approved or cleared by the [place] FDA and  has been authorized for detection and/or diagnosis of SARS-CoV-2 by FDA under an Emergency Use Authorization (EUA). This EUA will remain  in effect (meaning this test can be used) for the duration of the COVID-19 declaration under Section 564(b)(1) of the Act, 21 U.S.C.section 360bbb-3(b)(1), unless the authorization is terminated  or revoked sooner.       Influenza A by PCR NEGATIVE NEGATIVE Final   Influenza B by PCR NEGATIVE NEGATIVE Final    Comment: (NOTE) The Xpert Xpress SARS-CoV-2/FLU/RSV plus assay is intended as an aid in the diagnosis of influenza from Nasopharyngeal swab specimens and should not be used as a sole basis for treatment. Nasal washings and aspirates are unacceptable for Xpert Xpress SARS-CoV-2/FLU/RSV testing.  Fact Sheet for Patients: BloggerCourse.com  Fact Sheet for Healthcare Providers: SeriousBroker.it  This test is not yet approved or cleared by the [place] FDA and has been authorized for detection and/or diagnosis of SARS-CoV-2 by FDA under an Emergency Use Authorization (EUA). This EUA will remain in effect (meaning this test can be used) for the duration of the COVID-19 declaration under Section 564(b)(1) of the Act, 21  U.S.C. section 360bbb-3(b)(1), unless the authorization is terminated or revoked.  Performed at [facility], [address] [place], [place] [address]   Culture, blood (Routine X 2) w Reflex to ID Panel     Status: Abnormal   Collection Time: 05/07/21  5:13 AM   Specimen: BLOOD  Result Value Ref Range Status   Specimen Description   Final    BLOOD BLOOD LEFT HAND Performed at [facility], [address] [place], [place] [address]    Special Requests   Final    BOTTLES DRAWN AEROBIC AND ANAEROBIC Blood Culture adequate volume Performed at [facility], [address] [place], [place] [address]    Culture  Setup Time   Final    Organism ID to follow GRAM POSITIVE COCCI AEROBIC BOTTLE ONLY CRITICAL RESULT CALLED TO, READ BACK BY AND VERIFIED WITH: SUSANE WATSON AT 1827 ON 05/09/21  BY SS Performed at [facility], [address] [place], [place] [address]    Culture (A)  Final    STAPHYLOCOCCUS CAPITIS THE SIGNIFICANCE OF ISOLATING THIS ORGANISM FROM A SINGLE SET OF BLOOD CULTURES WHEN MULTIPLE SETS ARE DRAWN IS UNCERTAIN. PLEASE NOTIFY THE MICROBIOLOGY DEPARTMENT WITHIN ONE WEEK IF SPECIATION AND SENSITIVITIES ARE REQUIRED. Performed at [facility], [address], [place], [place] [address]    Report Status 05/12/2021 FINAL  Final  Blood Culture ID Panel (Reflexed)     Status: Abnormal   Collection Time: 05/07/21  5:13 AM  Result Value Ref Range Status   Enterococcus faecalis NOT DETECTED NOT DETECTED Final   Enterococcus Faecium NOT DETECTED NOT DETECTED Final   Listeria monocytogenes NOT DETECTED NOT DETECTED Final   Staphylococcus species DETECTED (A) NOT DETECTED Final    Comment: CRITICAL RESULT CALLED TO, READ BACK BY AND VERIFIED WITH: SUSANE WATSON AT 1827 ON 05/09/21 BY SS    Staphylococcus aureus (BCID) NOT DETECTED NOT DETECTED Final   Staphylococcus epidermidis NOT DETECTED NOT DETECTED Final   Staphylococcus  lugdunensis NOT DETECTED NOT DETECTED Final   Streptococcus species NOT DETECTED NOT DETECTED Final   Streptococcus agalactiae NOT DETECTED NOT DETECTED Final   Streptococcus pneumoniae NOT DETECTED NOT DETECTED Final   Streptococcus pyogenes NOT DETECTED NOT DETECTED Final   A.calcoaceticus-baumannii NOT DETECTED NOT DETECTED Final   Bacteroides fragilis NOT DETECTED NOT DETECTED Final   Enterobacterales NOT DETECTED NOT DETECTED Final   Enterobacter cloacae complex NOT DETECTED NOT DETECTED Final   Escherichia coli NOT DETECTED NOT DETECTED Final   Klebsiella aerogenes NOT DETECTED NOT DETECTED Final   Klebsiella oxytoca NOT DETECTED NOT DETECTED Final   Klebsiella pneumoniae NOT DETECTED NOT DETECTED Final   Proteus species NOT DETECTED NOT DETECTED Final   Salmonella species NOT DETECTED NOT DETECTED Final   Serratia marcescens NOT DETECTED NOT DETECTED Final   Haemophilus influenzae NOT DETECTED NOT DETECTED Final   Neisseria meningitidis NOT DETECTED NOT DETECTED Final   Pseudomonas aeruginosa NOT DETECTED NOT DETECTED Final   Stenotrophomonas maltophilia NOT DETECTED NOT DETECTED Final   Candida albicans NOT DETECTED NOT DETECTED Final   Candida auris NOT DETECTED NOT DETECTED Final   Candida glabrata NOT DETECTED NOT DETECTED Final   Candida krusei NOT DETECTED NOT DETECTED Final   Candida parapsilosis NOT DETECTED NOT DETECTED Final   Candida tropicalis NOT DETECTED NOT DETECTED Final   Cryptococcus neoformans/gattii NOT DETECTED NOT DETECTED Final    Comment: Performed at [facility], [address] [place], [place] [address]  Culture, blood (Routine X 2) w Reflex to ID Panel     Status: None   Collection Time: 05/07/21  5:14 AM   Specimen: BLOOD  Result Value Ref Range Status   Specimen Description BLOOD LEFT ANTECUBITAL  Final   Special Requests   Final    BOTTLES DRAWN AEROBIC AND ANAEROBIC Blood Culture adequate volume   Culture   Final    NO GROWTH 5  DAYS Performed at [facility], [address] [place], [place] [address]    Report Status 05/12/2021 FINAL  Final  CULTURE, BLOOD (ROUTINE X 2) w Reflex to ID Panel     Status: None (Preliminary result)   Collection Time: 05/10/21  4:29 AM   Specimen: BLOOD  Result Value Ref Range Status   Specimen Description BLOOD LEFT ANTECUBITAL  Final   Special Requests   Final    BOTTLES  DRAWN AEROBIC AND ANAEROBIC Blood Culture adequate volume   Culture   Final    NO GROWTH 2 DAYS Performed at [facility], [address] [place], [place] [address]    Report Status PENDING  Incomplete  CULTURE, BLOOD (ROUTINE X 2) w Reflex to ID Panel     Status: None (Preliminary result)   Collection Time: 05/10/21  4:31 AM   Specimen: BLOOD  Result Value Ref Range Status   Specimen Description BLOOD BLOOD LEFT FOREARM  Final   Special Requests   Final    BOTTLES DRAWN AEROBIC AND ANAEROBIC Blood Culture adequate volume   Culture   Final    NO GROWTH 2 DAYS Performed at [facility], [address] [place], [place] [address]    Report Status PENDING  Incomplete       Scheduled Meds:  acetylcysteine  4 mL Nebulization BID   brinzolamide  1 drop Left Eye TID   And   brimonidine  1 drop Left Eye TID   calcium-vitamin D  1 tablet Oral TID   enoxaparin (LOVENOX) injection  40 mg Subcutaneous Q24H   Ensure  237 mL Oral BID BM   guaiFENesin  600 mg Oral BID   insulin aspart  0-5 Units Subcutaneous QHS   insulin aspart  0-9 Units Subcutaneous TID WC   insulin aspart  3 Units Subcutaneous TID WC   insulin glargine  8 Units Subcutaneous Daily   ipratropium-albuterol  3 mL Nebulization Q4H   magnesium oxide  400 mg Oral Daily   methylPREDNISolone (SOLU-MEDROL) injection  40 mg Intravenous Q12H   mometasone-formoterol  2 puff Inhalation BID   montelukast  10 mg Oral Daily   pantoprazole  20 mg Oral Daily   tamsulosin  0.4 mg Oral Daily   theophylline  400 mg Oral QHS      Assessment/Plan:  COPD exacerbation.  More bronchospasm today than yesterday.  Not ready to go home today.  Continue Solu-Medrol 40 mg IV twice daily.  Complete 5 days of Zithromax.  Continue nebulizer treatments and Dulera inhaler.  Continue theophylline which was started 2 nights ago.  Add Mucomyst nebulizer today.  Continue Mucinex.  Continue incentive spirometer. Chronic hypoxic respiratory failure secondary to COPD exacerbation.  Chronically on 2 L of oxygen. Hypokalemia and hypocalcemia on presentation. BPH.  Continue Flomax GERD.  On Protonix Glaucoma.  Continue eyedrops Anxiety.  Continue Xanax.  Add Celexa at night    Code Status:     Code Status Orders  (From admission, onward)           Start     Ordered   05/07/21 0334  Full code  Continuous        05/07/21 0335           Code Status History     Date Active Date Inactive Code Status Order ID Comments User Context   02/19/2021 1003 02/22/2021 1707 Full Code [id number]  Lucile ShuttersAgbata, Tochukwu, MD ED   11/22/2020 0529 11/24/2020 1931 Full Code [id number]  Andris Baumannuncan, Hazel V, MD ED   10/06/2020 0018 10/07/2020 2211 Full Code [id number]  Mansy, Vernetta HoneyJan A, MD ED   09/09/2020 0748 09/12/2020 1706 Full Code [id number]  Lorretta HarpNiu, Xilin, MD ED   07/25/2020 1050 07/28/2020 2206 Full Code [id number]  Lorretta HarpNiu, Xilin, MD ED   12/06/2019 1531 12/11/2019 1709 Full Code [id number]  Lorretta HarpNiu, Xilin, MD ED   10/29/2019 2105 11/01/2019 1608 Full Code [id number]  Andris Baumannuncan, Hazel V, MD  ED      Family Communication: Spoke with patient's sister on the phone Disposition Plan: Status is: Inpatient  Dispo: The patient is from: Home              Anticipated d/c is to: Home              Patient currently with more bronchospasm and wheeze today than yesterday.  Still requires IV steroids.   Difficult to place patient.  No.  Time spent: 28 minutes, seen in the morning and reevaluated in the afternoon.  Daily Doe [employer]  [facility]

## 2021-05-13 DIAGNOSIS — J9621 Acute and chronic respiratory failure with hypoxia: Secondary | ICD-10-CM

## 2021-05-13 LAB — GLUCOSE, CAPILLARY: Glucose-Capillary: 160 mg/dL — ABNORMAL HIGH (ref 70–99)

## 2021-05-13 MED ORDER — CITALOPRAM HYDROBROMIDE 10 MG PO TABS: 10.0000 mg | ORAL_TABLET | Freq: Every day | ORAL | 0 refills | Status: DC

## 2021-05-13 MED ORDER — GUAIFENESIN ER 600 MG PO TB12: 600.0000 mg | ORAL_TABLET | Freq: Two times a day (BID) | ORAL | 0 refills | Status: DC

## 2021-05-13 MED ORDER — MAGNESIUM OXIDE -MG SUPPLEMENT 400 (240 MG) MG PO TABS: 400.0000 mg | ORAL_TABLET | Freq: Every day | ORAL | 0 refills | Status: DC

## 2021-05-13 MED ORDER — INSULIN GLARGINE 100 UNIT/ML ~~LOC~~ SOLN
6.0000 [IU] | Freq: Every day | SUBCUTANEOUS | Status: DC
  Filled 2021-05-13: qty 0.06

## 2021-05-13 MED ORDER — PREDNISONE 10 MG PO TABS: ORAL_TABLET | ORAL | 0 refills | Status: DC

## 2021-05-13 MED ORDER — ALPRAZOLAM 0.25 MG PO TABS: 0.2500 mg | ORAL_TABLET | Freq: Three times a day (TID) | ORAL | 0 refills | Status: DC | PRN

## 2021-05-13 MED ORDER — CALCIUM CARBONATE-VITAMIN D 500-200 MG-UNIT PO TABS: 1.0000 | ORAL_TABLET | Freq: Every day | ORAL | 0 refills | Status: DC

## 2021-05-13 MED ORDER — THEOPHYLLINE ER 400 MG PO TB24: 400.0000 mg | ORAL_TABLET | Freq: Every day | ORAL | 0 refills | Status: DC

## 2021-05-13 NOTE — Discharge Summary (Signed)
[facility] [place] at [facility]   PATIENT NAME: Bryan Kelley    MR#:  [id number]  DATE OF BIRTH:  1956-10-25  DATE OF ADMISSION:  05/07/2021 ADMITTING PHYSICIAN: Bryan Beat, MD  DATE OF DISCHARGE: 05/13/2021 10:36 AM  PRIMARY CARE PHYSICIAN: [facility], [facility]    ADMISSION DIAGNOSIS:  Respiratory distress [R06.03] COPD exacerbation (HCC) [J44.1]  DISCHARGE DIAGNOSIS:  Active Problems:   Anxiety   COPD exacerbation (HCC)   Hypocalcemia   Hypokalemia   Glaucoma   SECONDARY DIAGNOSIS:   Past Medical History:  Diagnosis Date   Asthma    COPD (chronic obstructive pulmonary disease) (HCC)    Depression    GERD (gastroesophageal reflux disease)    Vision loss, left eye     HOSPITAL COURSE:   COPD exacerbation.  The patient was on IV Solu-Medrol the entire hospital course.  Completed 5 days of Zithromax.  I gave him nebulizers and Dulera inhaler.  I needed to add theophylline and Mucomyst nebulizers.  Patient was also on Mucinex.  Patient was on incentive spirometer.  On the day of discharge she was finally breathing better and wanting to go home.  Follow-up with Dr. Meredeth Ide as outpatient. Acute on chronic hypoxic respiratory failure secondary to COPD exacerbation.  Initially presented in respiratory distress requiring BiPAP initially.  Was tapered down to his usual oxygen 2 L. Hypokalemia and hypocalcemia on the hospitalization.  Both of these were replaced during the hospital course. BPH.  Continue Flomax GERD.  Continue Protonix Glaucoma.  Continue eyedrops Anxiety.  The patient states that the Xanax helps his breathing.  I did prescribe a small amount of Xanax.  I also prescribed Celexa at night to help out with preventing his anxiety.  Recommend checking a BMP in 2 weeks. Type 2 diabetes mellitus.  Hemoglobin A1c borderline at 6.6.  With IV Solu-Medrol his sugars were elevated I need to cover him with Lantus while here in the hospital.   Continue to monitor sugars as outpatient and low carbohydrate diet discussed. Elevated blood pressure without diagnosis of hypertension.  Recommend rechecking blood pressure in the office and starting medication if blood pressure still elevated.  His blood pressure prior to the 1 on discharge was 140/89 Initial blood culture was contaminant with Staph capitis.  Follow-up blood cultures are negative.  DISCHARGE CONDITIONS:  Satisfactory  CONSULTS OBTAINED:    None  DRUG ALLERGIES:  No Known Allergies  DISCHARGE MEDICATIONS:   Allergies as of 05/13/2021   No Known Allergies      Medication List     STOP taking these medications    multivitamin with minerals Tabs tablet   predniSONE 10 MG (21) Tbpk tablet Commonly known as: STERAPRED UNI-PAK 21 TAB Replaced by: predniSONE 10 MG tablet       TAKE these medications    albuterol 108 (90 Base) MCG/ACT inhaler Commonly known as: VENTOLIN HFA Inhale 2 puffs into the lungs every 6 (six) hours as needed for wheezing or shortness of breath.   albuterol (2.5 MG/3ML) 0.083% nebulizer solution Commonly known as: PROVENTIL Take 2.5 mg by nebulization every 4 (four) hours as needed for wheezing.   ALPRAZolam 0.25 MG tablet Commonly known as: XANAX Take 1 tablet (0.25 mg total) by mouth 3 (three) times daily as needed for anxiety.   calcium-vitamin D 500-200 MG-UNIT tablet Commonly known as: OSCAL WITH D Take 1 tablet by mouth daily with breakfast.   citalopram 10 MG tablet Commonly known  as: CELEXA Take 1 tablet (10 mg total) by mouth at bedtime.   Dulera 200-5 MCG/ACT Aero Generic drug: mometasone-formoterol Inhale 2 puffs into the lungs 2 (two) times daily.   Ensure Take 237 mLs by mouth 2 (two) times daily between meals.   erythromycin ophthalmic ointment Place 1 application into the left eye in the morning and at bedtime.   guaiFENesin 600 MG 12 hr tablet Commonly known as: MUCINEX Take 1 tablet (600 mg total) by  mouth 2 (two) times daily.   magnesium oxide 400 (240 Mg) MG tablet Commonly known as: MAG-OX Take 1 tablet (400 mg total) by mouth daily.   montelukast 10 MG tablet Commonly known as: SINGULAIR Take 1 tablet (10 mg total) by mouth daily.   pantoprazole 20 MG tablet Commonly known as: PROTONIX Take 1 tablet (20 mg total) by mouth daily.   predniSONE 10 MG tablet Commonly known as: DELTASONE 3 tabs po day1; 2 tabs po day2; 1 tab po day 3; 1/2 tab po day4,5 Start taking on: May 14, 2021 Replaces: predniSONE 10 MG (21) Tbpk tablet   Simbrinza 1-0.2 % Susp Generic drug: Brinzolamide-Brimonidine Place 1 drop into the left eye in the morning and at bedtime.   tamsulosin 0.4 MG Caps capsule Commonly known as: FLOMAX Take 0.4 mg by mouth daily.   theophylline 400 MG 24 hr tablet Commonly known as: UNIPHYL Take 1 tablet (400 mg total) by mouth at bedtime.         DISCHARGE INSTRUCTIONS:   Follow-up PMD 5 days Follow-up Bryan Kelley 1 to 2 weeks  If you experience worsening of your admission symptoms, develop shortness of breath, life threatening emergency, suicidal or homicidal thoughts you must seek medical attention immediately by calling 911 or calling your MD immediately  if symptoms less severe.  You Must read complete instructions/literature along with all the possible adverse reactions/side effects for all the Medicines you take and that have been prescribed to you. Take any new Medicines after you have completely understood and accept all the possible adverse reactions/side effects.   Please note  You were cared for by a hospitalist during your hospital stay. If you have any questions about your discharge medications or the care you received while you were in the hospital after you are discharged, you can call the unit and asked to speak with the hospitalist on call if the hospitalist that took care of you is not available. Once you are discharged, your primary care  physician will handle any further medical issues. Please note that NO REFILLS for any discharge medications will be authorized once you are discharged, as it is imperative that you return to your primary care physician (or establish a relationship with a primary care physician if you do not have one) for your aftercare needs so that they can reassess your need for medications and monitor your lab values.    Today   CHIEF COMPLAINT:   Chief Complaint  Patient presents with   Shortness of Breath    HISTORY OF PRESENT ILLNESS:  Bryan LookWalter Kelley  is a 65 y.o. male came in with shortness of breath and COPD exacerbation   VITAL SIGNS:  Blood pressure (!) 164/96, pulse 76, temperature 97.8 F (36.6 C), temperature source Oral, resp. rate 16, height 5\' 11"  (1.803 m), weight 98 kg, SpO2 96 %.   PHYSICAL EXAMINATION:  GENERAL:  65 y.o.-year-old patient lying in the bed with no acute distress.  EYES: Pupils equal, round, reactive to  light and accommodation. No scleral icterus.  HEENT: Head atraumatic, normocephalic. Oropharynx and nasopharynx clear.  LUNGS: Decreased breath sounds bilaterally, anteriorly slight expiratory wheezing.no rales,rhonchi or crepitation. No use of accessory muscles of respiration.  CARDIOVASCULAR: S1, S2 normal. No murmurs, rubs, or gallops.  ABDOMEN: Soft, non-tender, non-distended. Bowel sounds present. No organomegaly or mass.  EXTREMITIES: No pedal edema, cyanosis, or clubbing.  NEUROLOGIC: Cranial nerves II through XII are intact. Muscle strength 5/5 in all extremities. Sensation intact. Gait not checked.  PSYCHIATRIC: The patient is alert and oriented x 3.  SKIN: No obvious rash, lesion, or ulcer.   DATA REVIEW:   CBC Recent Labs  Lab 05/10/21 0429  WBC 17.4*  HGB 14.0  HCT 42.8  PLT 205    Chemistries  Recent Labs  Lab 05/07/21 0106 05/08/21 0449 05/10/21 0429 05/12/21 0407  NA 141  140 135   < > 135  K 4.1  3.4* 4.6   < > 4.8  CL 111  104 99    < > 95*  CO2 25  28 31    < > 36*  GLUCOSE 147*  136* 255*   < > 237*  BUN 9  11 19    < > 30*  CREATININE 0.64  0.86 0.94   < > 0.92  CALCIUM 6.3*  8.7* 9.4   < > 9.1  MG 1.8 1.9  --   --   AST 27  --   --   --   ALT 26  --   --   --   ALKPHOS 74  --   --   --   BILITOT 0.6  --   --   --    < > = values in this interval not displayed.     Microbiology Results  Results for orders placed or performed during the hospital encounter of 05/07/21  Resp Panel by RT-PCR (Flu A&B, Covid) Nasopharyngeal Swab     Status: None   Collection Time: 05/07/21  1:06 AM   Specimen: Nasopharyngeal Swab; Nasopharyngeal(NP) swabs in vial transport medium  Result Value Ref Range Status   SARS Coronavirus 2 by RT PCR NEGATIVE NEGATIVE Final    Comment: (NOTE) SARS-CoV-2 target nucleic acids are NOT DETECTED.  The SARS-CoV-2 RNA is generally detectable in upper respiratory specimens during the acute phase of infection. The lowest concentration of SARS-CoV-2 viral copies this assay can detect is 138 copies/mL. A negative result does not preclude SARS-Cov-2 infection and should not be used as the sole basis for treatment or other patient management decisions. A negative result may occur with  improper specimen collection/handling, submission of specimen other than nasopharyngeal swab, presence of viral mutation(s) within the areas targeted by this assay, and inadequate number of viral copies(<138 copies/mL). A negative result must be combined with clinical observations, patient history, and epidemiological information. The expected result is Negative.  Fact Sheet for Patients:  07/08/21  Fact Sheet for Healthcare Providers:  07/08/21  This test is no t yet approved or cleared by the [employer] FDA and  has been authorized for detection and/or diagnosis of SARS-CoV-2 by FDA under an Emergency Use Authorization (EUA). This EUA  will remain  in effect (meaning this test can be used) for the duration of the COVID-19 declaration under Section 564(b)(1) of the Act, 21 U.S.C.section 360bbb-3(b)(1), unless the authorization is terminated  or revoked sooner.       Influenza A by PCR NEGATIVE NEGATIVE Final   Influenza B  by PCR NEGATIVE NEGATIVE Final    Comment: (NOTE) The Xpert Xpress SARS-CoV-2/FLU/RSV plus assay is intended as an aid in the diagnosis of influenza from Nasopharyngeal swab specimens and should not be used as a sole basis for treatment. Nasal washings and aspirates are unacceptable for Xpert Xpress SARS-CoV-2/FLU/RSV testing.  Fact Sheet for Patients: [employer]  Fact Sheet for Healthcare Providers: [employer]  This test is not yet approved or cleared by the [place] FDA and has been authorized for detection and/or diagnosis of SARS-CoV-2 by FDA under an Emergency Use Authorization (EUA). This EUA will remain in effect (meaning this test can be used) for the duration of the COVID-19 declaration under Section 564(b)(1) of the Act, 21 U.S.C. section 360bbb-3(b)(1), unless the authorization is terminated or revoked.  Performed at [facility], [address] [place], [place] [address]   Culture, blood (Routine X 2) w Reflex to ID Panel     Status: Abnormal   Collection Time: 05/07/21  5:13 AM   Specimen: BLOOD  Result Value Ref Range Status   Specimen Description   Final    BLOOD BLOOD LEFT HAND Performed at [facility], [address], [place], [place] [address]    Special Requests   Final    BOTTLES DRAWN AEROBIC AND ANAEROBIC Blood Culture adequate volume Performed at [facility], [address] [place], [place] [address]    Culture  Setup Time   Final    Organism ID to follow GRAM POSITIVE COCCI AEROBIC BOTTLE ONLY CRITICAL RESULT CALLED TO, READ BACK BY AND VERIFIED WITH: SUSANE  WATSON AT 1827 ON 05/09/21 BY SS Performed at [facility], [address] [place], [place] [address]    Culture (A)  Final    STAPHYLOCOCCUS CAPITIS THE SIGNIFICANCE OF ISOLATING THIS ORGANISM FROM A SINGLE SET OF BLOOD CULTURES WHEN MULTIPLE SETS ARE DRAWN IS UNCERTAIN. PLEASE NOTIFY THE MICROBIOLOGY DEPARTMENT WITHIN ONE WEEK IF SPECIATION AND SENSITIVITIES ARE REQUIRED. Performed at [facility], [address], [place], [place] [address]    Report Status 05/12/2021 FINAL  Final  Blood Culture ID Panel (Reflexed)     Status: Abnormal   Collection Time: 05/07/21  5:13 AM  Result Value Ref Range Status   Enterococcus faecalis NOT DETECTED NOT DETECTED Final   Enterococcus Faecium NOT DETECTED NOT DETECTED Final   Listeria monocytogenes NOT DETECTED NOT DETECTED Final   Staphylococcus species DETECTED (A) NOT DETECTED Final    Comment: CRITICAL RESULT CALLED TO, READ BACK BY AND VERIFIED WITH: SUSANE WATSON AT 1827 ON 05/09/21 BY SS    Staphylococcus aureus (BCID) NOT DETECTED NOT DETECTED Final   Staphylococcus epidermidis NOT DETECTED NOT DETECTED Final   Staphylococcus lugdunensis NOT DETECTED NOT DETECTED Final   Streptococcus species NOT DETECTED NOT DETECTED Final   Streptococcus agalactiae NOT DETECTED NOT DETECTED Final   Streptococcus pneumoniae NOT DETECTED NOT DETECTED Final   Streptococcus pyogenes NOT DETECTED NOT DETECTED Final   A.calcoaceticus-baumannii NOT DETECTED NOT DETECTED Final   Bacteroides fragilis NOT DETECTED NOT DETECTED Final   Enterobacterales NOT DETECTED NOT DETECTED Final   Enterobacter cloacae complex NOT DETECTED NOT DETECTED Final   Escherichia coli NOT DETECTED NOT DETECTED Final   Klebsiella aerogenes NOT DETECTED NOT DETECTED Final   Klebsiella oxytoca NOT DETECTED NOT DETECTED Final   Klebsiella pneumoniae NOT DETECTED NOT DETECTED Final   Proteus species NOT DETECTED NOT DETECTED Final   Salmonella species NOT DETECTED NOT  DETECTED  Final   Serratia marcescens NOT DETECTED NOT DETECTED Final   Haemophilus influenzae NOT DETECTED NOT DETECTED Final   Neisseria meningitidis NOT DETECTED NOT DETECTED Final   Pseudomonas aeruginosa NOT DETECTED NOT DETECTED Final   Stenotrophomonas maltophilia NOT DETECTED NOT DETECTED Final   Candida albicans NOT DETECTED NOT DETECTED Final   Candida auris NOT DETECTED NOT DETECTED Final   Candida glabrata NOT DETECTED NOT DETECTED Final   Candida krusei NOT DETECTED NOT DETECTED Final   Candida parapsilosis NOT DETECTED NOT DETECTED Final   Candida tropicalis NOT DETECTED NOT DETECTED Final   Cryptococcus neoformans/gattii NOT DETECTED NOT DETECTED Final    Comment: Performed at [facility], [address] [place], [place] [address]  Culture, blood (Routine X 2) w Reflex to ID Panel     Status: None   Collection Time: 05/07/21  5:14 AM   Specimen: BLOOD  Result Value Ref Range Status   Specimen Description BLOOD LEFT ANTECUBITAL  Final   Special Requests   Final    BOTTLES DRAWN AEROBIC AND ANAEROBIC Blood Culture adequate volume   Culture   Final    NO GROWTH 5 DAYS Performed at [facility], [address] [place], [place] [address]    Report Status 05/12/2021 FINAL  Final  CULTURE, BLOOD (ROUTINE X 2) w Reflex to ID Panel     Status: None (Preliminary result)   Collection Time: 05/10/21  4:29 AM   Specimen: BLOOD  Result Value Ref Range Status   Specimen Description BLOOD LEFT ANTECUBITAL  Final   Special Requests   Final    BOTTLES DRAWN AEROBIC AND ANAEROBIC Blood Culture adequate volume   Culture   Final    NO GROWTH 3 DAYS Performed at [facility], [address] [place], [place] [address]    Report Status PENDING  Incomplete  CULTURE, BLOOD (ROUTINE X 2) w Reflex to ID Panel     Status: None (Preliminary result)   Collection Time: 05/10/21  4:31 AM   Specimen: BLOOD  Result Value Ref Range Status   Specimen Description  BLOOD BLOOD LEFT FOREARM  Final   Special Requests   Final    BOTTLES DRAWN AEROBIC AND ANAEROBIC Blood Culture adequate volume   Culture   Final    NO GROWTH 3 DAYS Performed at [facility], [address], [place], [place] [address]    Report Status PENDING  Incomplete     Management plans discussed with the patient, family and they are in agreement.  CODE STATUS:     Code Status Orders  (From admission, onward)           Start     Ordered   05/07/21 0334  Full code  Continuous        05/07/21 0335           Code Status History     Date Active Date Inactive Code Status Order ID Comments User Context   02/19/2021 1003 02/22/2021 1707 Full Code [id number]  Lucile Shutters, MD ED   11/22/2020 0529 11/24/2020 1931 Full Code [id number]  Andris Baumann, MD ED   10/06/2020 0018 10/07/2020 2211 Full Code [id number]  Mansy, Vernetta Honey, MD ED   09/09/2020 0748 09/12/2020 1706 Full Code [id number]  Lorretta Harp, MD ED   07/25/2020 1050 07/28/2020 2206 Full Code [id number]  Lorretta Harp, MD ED   12/06/2019 1531 12/11/2019 1709 Full Code [id number]  Lorretta Harp, MD ED  10/29/2019 2105 11/01/2019 1608 Full Code [id number]  Andris Baumann, MD ED       TOTAL TIME TAKING CARE OF THIS PATIENT: 33 minutes.    Alford Highland M.D on 05/13/2021 at 4:16 PM   [facility]  CC: Primary care physician; [facility], [facility]

## 2021-05-13 NOTE — Discharge Instructions (Signed)
Check theophylline level at follow up appointment

## 2021-05-13 NOTE — TOC Transition Note (Signed)
Transition of Care [facility]) - CM/SW Discharge Note   Patient Details  Name: KODIAK ROLLYSON MRN: [id number] Date of Birth: Mar 25, 1956  [facility]) CM/SW Contact:  Margarito Liner, LCSW Phone Number: 05/13/2021, 9:16 AM   Clinical Narrative:  Patient has orders to discharge home today. No further concerns. CSW signing off.   Final next level of care: Home/Self Care Barriers to Discharge: Barriers Resolved   Patient Goals and CMS Choice        Discharge Placement                    Patient and family notified of of transfer: 05/13/21  Discharge Plan and Services     Post Acute Care Choice: NA                               Social Determinants of Health (SDOH) Interventions     Readmission Risk Interventions Readmission Risk Prevention Plan 05/10/2021  Transportation Screening Complete  PCP or Specialist Appt within 3-5 Days Complete  Social Work Consult for Recovery Care Planning/Counseling Complete  Palliative Care Screening Not Applicable  Medication Review Oceanographer) Complete  Some recent data might be hidden

## 2021-05-13 NOTE — Plan of Care (Signed)

## 2021-05-14 LAB — THEOPHYLLINE LEVEL: Theophylline Lvl: 2.7 ug/mL — ABNORMAL LOW (ref 10.0–20.0)

## 2021-05-15 LAB — CULTURE, BLOOD (ROUTINE X 2)
Culture: NO GROWTH
Culture: NO GROWTH
Special Requests: ADEQUATE
Special Requests: ADEQUATE

## 2021-05-20 ENCOUNTER — Telehealth (INDEPENDENT_AMBULATORY_CARE_PROVIDER_SITE_OTHER): Payer: Self-pay | Admitting: Gastroenterology

## 2021-05-20 ENCOUNTER — Other Ambulatory Visit: Payer: Self-pay

## 2021-05-20 DIAGNOSIS — Z1211 Encounter for screening for malignant neoplasm of colon: Secondary | ICD-10-CM

## 2021-05-20 MED ORDER — PEG 3350-KCL-NA BICARB-NACL 420 G PO SOLR: 4000.0000 mL | Freq: Once | ORAL | 0 refills | Status: AC

## 2021-05-20 NOTE — Progress Notes (Signed)
Gastroenterology Pre-Procedure Review  Request Date: 06/21/21 Requesting Physician: Dr. Servando Snare  PATIENT REVIEW QUESTIONS: The patient responded to the following health history questions as indicated:    1. Are you having any GI issues? no 2. Do you have a personal history of Polyps? no 3. Do you have a family history of Colon Cancer or Polyps? no 4. Diabetes Mellitus? no 5. Joint replacements in the past 12 months?no 6. Major health problems in the past 3 months?no 7. Any artificial heart valves, MVP, or defibrillator?no    MEDICATIONS & ALLERGIES:    Patient reports the following regarding taking any anticoagulation/antiplatelet therapy:   Plavix, Coumadin, Eliquis, Xarelto, Lovenox, Pradaxa, Brilinta, or Effient? no Aspirin? no  Patient confirms/reports the following medications:  Current Outpatient Medications  Medication Sig Dispense Refill   albuterol (PROVENTIL) (2.5 MG/3ML) 0.083% nebulizer solution Take 2.5 mg by nebulization every 4 (four) hours as needed for wheezing.     albuterol (VENTOLIN HFA) 108 (90 Base) MCG/ACT inhaler Inhale 2 puffs into the lungs every 6 (six) hours as needed for wheezing or shortness of breath. 8 g 1   ALPRAZolam (XANAX) 0.25 MG tablet Take 1 tablet (0.25 mg total) by mouth 3 (three) times daily as needed for anxiety. 30 tablet 0   calcium-vitamin D (OSCAL WITH D) 500-200 MG-UNIT tablet Take 1 tablet by mouth daily with breakfast. 30 tablet 0   citalopram (CELEXA) 10 MG tablet Take 1 tablet (10 mg total) by mouth at bedtime. 30 tablet 0   DULERA 200-5 MCG/ACT AERO Inhale 2 puffs into the lungs 2 (two) times daily. 1 each 1   Ensure (ENSURE) Take 237 mLs by mouth 2 (two) times daily between meals.     erythromycin ophthalmic ointment Place 1 application into the left eye in the morning and at bedtime.     guaiFENesin (MUCINEX) 600 MG 12 hr tablet Take 1 tablet (600 mg total) by mouth 2 (two) times daily. 20 tablet 0   magnesium oxide (MAG-OX) 400 (240  Mg) MG tablet Take 1 tablet (400 mg total) by mouth daily. 30 tablet 0   montelukast (SINGULAIR) 10 MG tablet Take 1 tablet (10 mg total) by mouth daily. 30 tablet 0   pantoprazole (PROTONIX) 20 MG tablet Take 1 tablet (20 mg total) by mouth daily. 30 tablet 0   predniSONE (DELTASONE) 10 MG tablet 3 tabs po day1; 2 tabs po day2; 1 tab po day 3; 1/2 tab po day4,5 7 tablet 0   SIMBRINZA 1-0.2 % SUSP Place 1 drop into the left eye in the morning and at bedtime.     tamsulosin (FLOMAX) 0.4 MG CAPS capsule Take 0.4 mg by mouth daily.     theophylline (UNIPHYL) 400 MG 24 hr tablet Take 1 tablet (400 mg total) by mouth at bedtime. 30 tablet 0   No current facility-administered medications for this visit.    Patient confirms/reports the following allergies:  No Known Allergies  No orders of the defined types were placed in this encounter.   AUTHORIZATION INFORMATION Primary Insurance: 1D#: Group #:  Secondary Insurance: 1D#: Group #:  SCHEDULE INFORMATION: Date: 06/21/21 Time: Location: [facility]

## 2021-06-13 ENCOUNTER — Encounter: Payer: Self-pay | Admitting: Gastroenterology

## 2021-06-13 NOTE — Progress Notes (Signed)
FYI: Patient is on 2L of O2 at all times.

## 2021-06-14 ENCOUNTER — Encounter: Payer: Self-pay | Admitting: Anesthesiology

## 2021-06-17 ENCOUNTER — Other Ambulatory Visit: Payer: Self-pay

## 2021-06-17 MED ORDER — PEG 3350-KCL-NA BICARB-NACL 420 G PO SOLR: ORAL | 0 refills | Status: DC

## 2021-06-26 ENCOUNTER — Ambulatory Visit: Admission: RE | Admit: 2021-06-26 | Payer: Medicaid Other | Source: Home / Self Care | Admitting: Gastroenterology

## 2021-06-26 SURGERY — COLONOSCOPY WITH PROPOFOL
Anesthesia: Choice

## 2021-07-03 ENCOUNTER — Telehealth: Payer: Self-pay

## 2021-07-03 NOTE — Telephone Encounter (Signed)
Pt. Missed procedure date. Sister called back to schedule another date

## 2021-07-03 NOTE — Telephone Encounter (Signed)
Returned patient's sister call. She plans to call back to reschedule procedure for patient once he is feeling better. She states he is having diarrhea currently.

## 2022-04-24 IMAGING — CR DG CHEST 2V
3 series · 3 of 3 positions shown · non-contrast
Comparison: 05/07/2021, CT 11/30/2019

CLINICAL DATA: Cough

EXAM:
CHEST - 2 VIEW

[chest lat (1 of 2)]
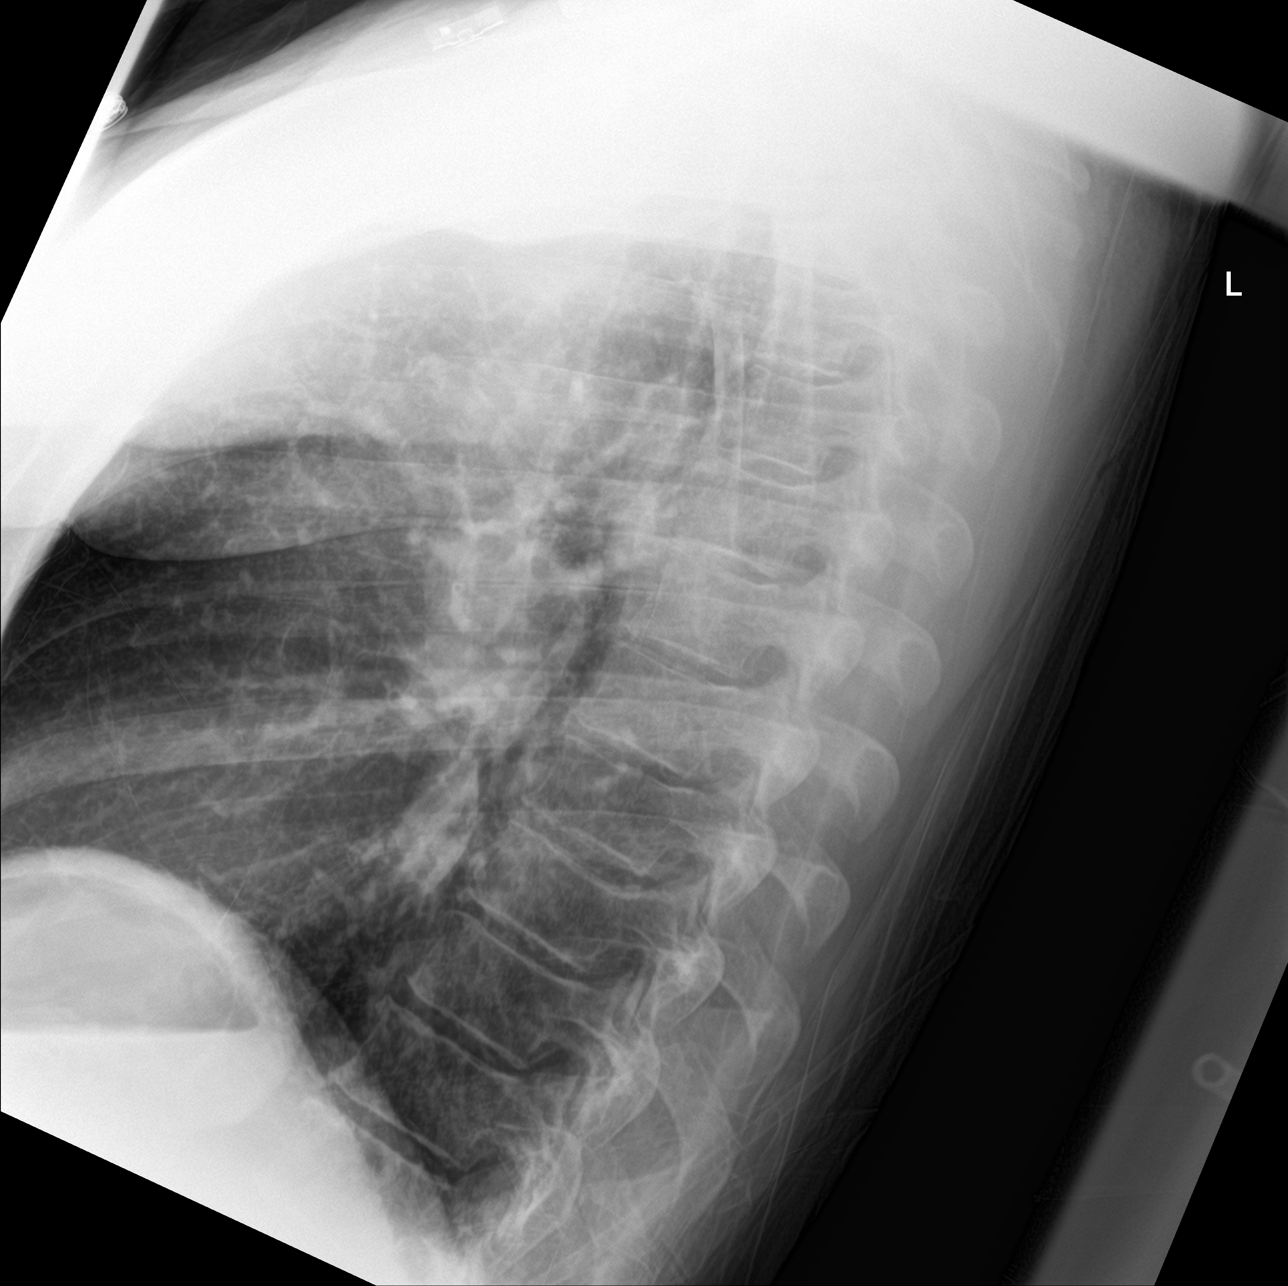

[chest ap]
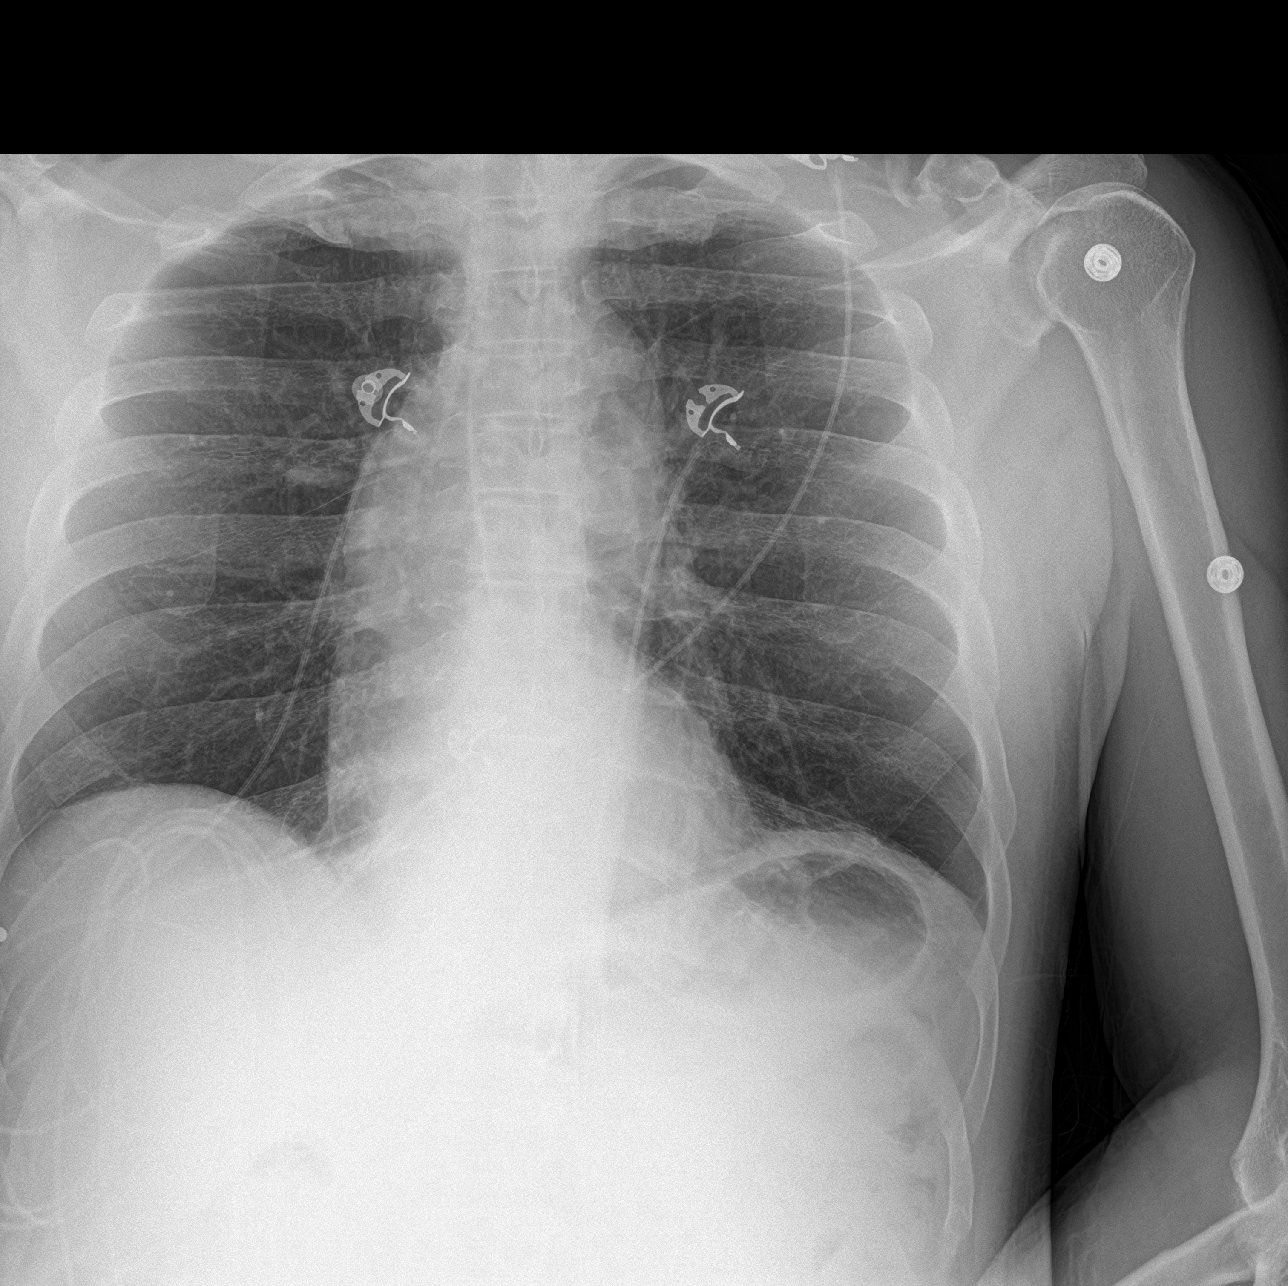

[chest lat (2 of 2)]
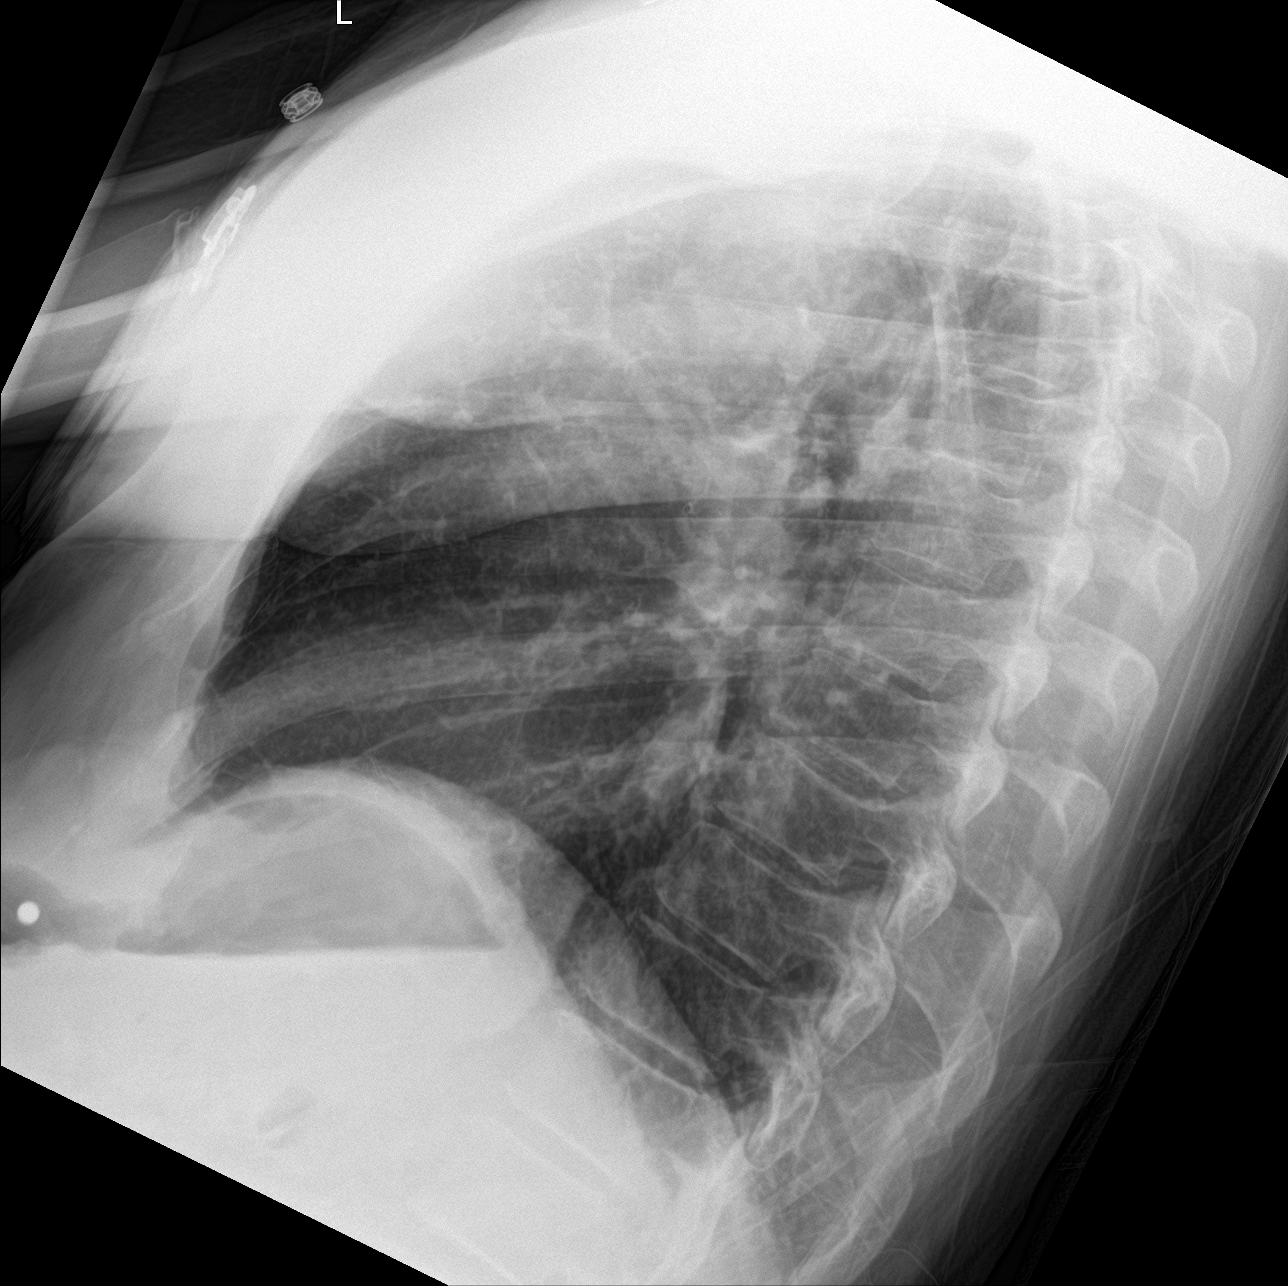

[3 of 3 positions shown; findings below may reference images not displayed]

FINDINGS: The heart size and mediastinal contours are within normal limits.
Both lungs are clear. The visualized skeletal structures are
unremarkable.
IMPRESSION: No active cardiopulmonary disease.

## 2022-06-24 ENCOUNTER — Other Ambulatory Visit: Payer: Self-pay | Admitting: *Deleted

## 2022-06-24 DIAGNOSIS — Z87891 Personal history of nicotine dependence: Secondary | ICD-10-CM

## 2022-06-24 DIAGNOSIS — Z122 Encounter for screening for malignant neoplasm of respiratory organs: Secondary | ICD-10-CM

## 2022-07-15 ENCOUNTER — Telehealth: Payer: Self-pay | Admitting: Acute Care

## 2022-07-15 ENCOUNTER — Encounter: Payer: Medicaid Other | Admitting: Acute Care

## 2022-07-15 NOTE — Telephone Encounter (Signed)
Appts rescheduled.

## 2022-07-15 NOTE — Telephone Encounter (Signed)
Patient was a no show for his [facility] 9/12/at 1500. I called x 2. I left a message asking the patient to return my call by 15:15 in order to be able yo move forward with scan 07/16/2022 as scheduled. I tried again the second time at 15:15 . No  answer. Please cancel scan scheduled for 07/16/2022 at 2 pm and reschedule SDMV and scan. So sorry for the additional work for you and [employer].

## 2022-07-16 ENCOUNTER — Ambulatory Visit: Payer: Medicaid Other

## 2022-08-19 ENCOUNTER — Ambulatory Visit (INDEPENDENT_AMBULATORY_CARE_PROVIDER_SITE_OTHER): Payer: Medicaid Other | Admitting: Acute Care

## 2022-08-19 ENCOUNTER — Encounter: Payer: Self-pay | Admitting: Acute Care

## 2022-08-19 DIAGNOSIS — Z87891 Personal history of nicotine dependence: Secondary | ICD-10-CM

## 2022-08-19 NOTE — Patient Instructions (Signed)

## 2022-08-19 NOTE — Progress Notes (Signed)
Virtual Visit via Telephone Note  I connected with Bryan Kelley on 08/19/22 at  3:00 PM EDT by telephone and verified that I am speaking with the correct person using two identifiers.  Location: Patient:  At home Provider: [address], [place], [place], [address]    I discussed the limitations, risks, security and privacy concerns of performing an evaluation and management service by telephone and the availability of in person appointments. I also discussed with the patient that there may be a patient responsible charge related to this service. The patient expressed understanding and agreed to proceed.   Shared Decision Making Visit Lung Cancer Screening Program [phone])   Eligibility: Age 66 y.o. Pack Years Smoking History Calculation 24 pack year smoking history (# packs/per year x # years smoked) Recent History of coughing up blood  no Unexplained weight loss? no ( >Than 15 pounds within the last 6 months ) Prior History Lung / other cancer no (Diagnosis within the last 5 years already requiring surveillance chest CT Scans). Smoking Status Former Smoker Former Smokers: Years since quit: < 1 year  Quit Date: 03/2022  Visit Components: Discussion included one or more decision making aids. yes Discussion included risk/benefits of screening. yes Discussion included potential follow up diagnostic testing for abnormal scans. yes Discussion included meaning and risk of over diagnosis. yes Discussion included meaning and risk of False Positives. yes Discussion included meaning of total radiation exposure. yes  Counseling Included: Importance of adherence to annual lung cancer LDCT screening. yes Impact of comorbidities on ability to participate in the program. yes Ability and willingness to under diagnostic treatment. yes  Smoking Cessation Counseling: Current Smokers:  Discussed importance of smoking cessation. yes Information about tobacco cessation classes and  interventions provided to patient. yes Patient provided with "ticket" for LDCT Scan. yes Symptomatic Patient. no  Counseling NA Diagnosis Code: Tobacco Use Z72.0 Asymptomatic Patient yes  Counseling (Intermediate counseling: > three minutes counseling) D2202 Former Smokers:  Discussed the importance of maintaining cigarette abstinence. yes Diagnosis Code: Personal History of Nicotine Dependence. R42.706 Information about tobacco cessation classes and interventions provided to patient. Yes Patient provided with "ticket" for LDCT Scan. yes Written Order for Lung Cancer Screening with LDCT placed in Epic. Yes (CT Chest Lung Cancer Screening Low Dose W/O CM) [id number] Z12.2-Screening of respiratory organs Z87.891-Personal history of nicotine dependence  I spent 25 minutes of face to face time/virtual visit time  with  Bryan Kelley discussing the risks and benefits of lung cancer screening. We took the time to pause the power point at intervals to allow for questions to be asked and answered to ensure understanding. We discussed that he had taken the single most powerful action possible to decrease his risk of developing lung cancer when he quit smoking. I counseled him to remain smoke free, and to contact me if he ever had the desire to smoke again so that I can provide resources and tools to help support the effort to remain smoke free. We discussed the time and location of the scan, and that either  Doroteo Glassman RN, Joella Prince, RN or I  or I will call / send a letter with the results within  24-72 hours of receiving them. He has the office contact information in the event he needs to speak with me,  he verbalized understanding of all of the above and had no further questions upon leaving the office.     I explained to the patient that there  has been a high incidence of coronary artery disease noted on these exams. I explained that this is a non-gated exam therefore degree or severity cannot be  determined. This patient is not on statin therapy. I have asked the patient to follow-up with their PCP regarding any incidental finding of coronary artery disease and management with diet or medication as they feel is clinically indicated. The patient verbalized understanding of the above and had no further questions.     Magdalen Spatz, NP 08/19/2022

## 2022-08-20 ENCOUNTER — Ambulatory Visit
Admission: RE | Admit: 2022-08-20 | Discharge: 2022-08-20 | Disposition: A | Discharge status: Home / Self Care | Payer: Medicaid Other | Source: Ambulatory Visit | Attending: Acute Care | Admitting: Acute Care

## 2022-08-20 DIAGNOSIS — Z122 Encounter for screening for malignant neoplasm of respiratory organs: Secondary | ICD-10-CM | POA: Diagnosis present

## 2022-08-20 DIAGNOSIS — Z87891 Personal history of nicotine dependence: Secondary | ICD-10-CM | POA: Insufficient documentation

## 2022-08-22 ENCOUNTER — Telehealth: Payer: Self-pay | Admitting: Acute Care

## 2022-08-22 NOTE — Telephone Encounter (Signed)
Received call report from [place] with [facility] on patient's lung cancer screen CT done on 08/20/22. Sarah please review the result/impression copied below:  IMPRESSION: 1. New 6.3 mm left upper lobe nodule. Lung-RADS 4A, suspicious. Follow up low-dose chest CT without contrast in 3 months (please use the following order, "CT CHEST LCS NODULE FOLLOW-UP W/O CM") is recommended. Alternatively, PET may be considered when there is a solid component 70mm or larger. These results will be called to the ordering clinician or representative by the Radiologist Assistant, and communication documented in the PACS or [employer]. 2. Aortic atherosclerosis (ICD10-I70.0). Coronary artery calcification. 3.  Emphysema (ICD10-J43.9).  Please advise, thank you.   Routing to lung nodule pool as well.

## 2022-08-28 ENCOUNTER — Telehealth: Payer: Self-pay | Admitting: Acute Care

## 2022-08-28 NOTE — Telephone Encounter (Signed)
I have attempted to call the patient with the results of their low dose CT. There was no answer. I left a HIPPA compliant message on their VM with the office contact number requesting that they call [phone] to review the results of the scan.  

## 2022-08-29 ENCOUNTER — Telehealth: Payer: Self-pay | Admitting: Acute Care

## 2022-08-29 NOTE — Telephone Encounter (Signed)
I have attempted to call the patient with the results of his low dose Ct Chest. There was no answer. There was no option to leave a message. I the called his sister Cynthia's phone as she had arranged for him to have the screening scan done. I was able to leave a HIPPA compliant message , asking that she ask her brother to call us at [phone] for his results.  Langley Gauss, ths is the second call to this patient to provide results. If he does not call by Monday, we will send a certified letter asking him to call the office so we can review his results with him. Once we have given him the results , please fax results to PCP and place order for 3 month low dose CT follow up. Thanks so much.

## 2022-09-01 NOTE — Telephone Encounter (Signed)
See other telephone note from 08/29/22.

## 2022-09-01 NOTE — Telephone Encounter (Signed)
Received voicemail from Anders Simmonds, pt's sister returning a call from Eric Form, NP. Her call back number is [phone].

## 2022-09-01 NOTE — Telephone Encounter (Signed)
See other telephone note on 08/29/22.

## 2022-09-03 NOTE — Telephone Encounter (Signed)
I have attempted to call the patient with the results of their  Low Dose CT Chest Lung cancer screening scan. There was no answer. There is no option to leave a message for a call back number I have also tried to reach out to the patient's sister , Bryan Kelley. She did return a call yesterday when I was in clinic and unable to talk. I have returned her call, and left another message for her to return the call with the office contact information.Bryan Kelley and Bryan Kelley, If you get a call from Bryan Kelley please let her know his scan was read as a Lung RADS 4 A : suspicious findings, either short term follow up in 3 months or alternatively  . He will need a 3 month follow up scan.  Thanks so much

## 2022-09-04 ENCOUNTER — Other Ambulatory Visit: Payer: Self-pay

## 2022-09-04 DIAGNOSIS — Z122 Encounter for screening for malignant neoplasm of respiratory organs: Secondary | ICD-10-CM

## 2022-09-04 DIAGNOSIS — Z87891 Personal history of nicotine dependence: Secondary | ICD-10-CM

## 2022-09-04 DIAGNOSIS — R911 Solitary pulmonary nodule: Secondary | ICD-10-CM

## 2022-09-04 NOTE — Telephone Encounter (Signed)
I have attempted to call the patient . There was no answer and no option to leave a message as there was no VM. I have also tried to reach the patient's sister Anders Simmonds. She arranged for the patient's screening and was with him during the shared decision making visit. I have left another message on her voice mail. I explained that we will mail him a letter explaining the results of his scan. I asked her to call the office if she has any questions. Langley Gauss, please mail a letter to Mr. Candelaria. Thanks so much

## 2022-09-04 NOTE — Telephone Encounter (Signed)
Please order a 3 month follow up scan and fax results to PCP. Thanks so much

## 2022-09-04 NOTE — Telephone Encounter (Signed)
Letter mailed to home with results and plan for follow up.  New order placed for 3 month follow up nodule CT.  Results and plan faxed to PCP

## 2022-09-05 ENCOUNTER — Telehealth: Payer: Self-pay | Admitting: Acute Care

## 2022-09-05 NOTE — Telephone Encounter (Signed)
Spoke with patient and his sister by phone to review results of LDCT.  Results noted a new lung nodule that was not seen on CTA in 2021.  It was noted as suspicious and needing follow up. This does not mean it is lung cancer but it is important to do a follow up CT in 3 months to monitor the appearance of this nodule.  Also noted was emphysema and atherosclerosis.  Med list does not show statin medication.  Advised to discuss results with PCP at next visit for further recommendations.  Patient and sister acknowledged understanding of results and agree to proceed with follow up scan.  Order placed for 3 month LCS nodule follow up.  Results faxed to PCP with plan.

## 2022-11-21 ENCOUNTER — Ambulatory Visit: Payer: Medicaid Other | Attending: Acute Care

## 2023-07-03 ENCOUNTER — Observation Stay
Admission: EM | Admit: 2023-07-03 | Discharge: 2023-07-05 | Disposition: A | Discharge status: Home / Self Care | Payer: Medicare Other | Attending: Internal Medicine | Admitting: Internal Medicine

## 2023-07-03 DIAGNOSIS — J449 Chronic obstructive pulmonary disease, unspecified: Secondary | ICD-10-CM

## 2023-07-03 DIAGNOSIS — Z87891 Personal history of nicotine dependence: Secondary | ICD-10-CM | POA: Diagnosis not present

## 2023-07-03 DIAGNOSIS — J441 Chronic obstructive pulmonary disease with (acute) exacerbation: Secondary | ICD-10-CM | POA: Diagnosis not present

## 2023-07-03 DIAGNOSIS — J9601 Acute respiratory failure with hypoxia: Secondary | ICD-10-CM | POA: Insufficient documentation

## 2023-07-03 DIAGNOSIS — Z1152 Encounter for screening for COVID-19: Secondary | ICD-10-CM | POA: Insufficient documentation

## 2023-07-03 DIAGNOSIS — R0602 Shortness of breath: Secondary | ICD-10-CM | POA: Diagnosis present

## 2023-07-03 DIAGNOSIS — R911 Solitary pulmonary nodule: Secondary | ICD-10-CM | POA: Insufficient documentation

## 2023-07-03 DIAGNOSIS — I2510 Atherosclerotic heart disease of native coronary artery without angina pectoris: Secondary | ICD-10-CM | POA: Insufficient documentation

## 2023-07-03 DIAGNOSIS — J9611 Chronic respiratory failure with hypoxia: Secondary | ICD-10-CM

## 2023-07-03 DIAGNOSIS — I2584 Coronary atherosclerosis due to calcified coronary lesion: Secondary | ICD-10-CM | POA: Diagnosis not present

## 2023-07-03 DIAGNOSIS — R0902 Hypoxemia: Secondary | ICD-10-CM

## 2023-07-03 DIAGNOSIS — F419 Anxiety disorder, unspecified: Secondary | ICD-10-CM | POA: Diagnosis present

## 2023-07-03 MED ORDER — MAGNESIUM SULFATE 2 GM/50ML IV SOLN
2.0000 g | Freq: Once | INTRAVENOUS | Status: AC
  Administered 2023-07-04: 2 g via INTRAVENOUS
  Filled 2023-07-03: qty 50

## 2023-07-03 NOTE — ED Provider Notes (Signed)
[facility] Provider Note    Event Date/Time   First MD Initiated Contact with Patient 07/03/23 2353     (approximate)   History   Shortness of breath  HPI  Bryan Kelley is a 67 y.o. male brought to the ED via EMS from home with a chief complaint of shortness of breath and hypoxia.  Patient with a history of COPD on continuous 3 L nasal cannula oxygen.  Symptoms x 2 days with increasing use of his home inhaler.  EMS reports saturations on patient's baseline 3 L was 88%.  Given 125 mg IV Solu-Medrol and 2 DuoNeb's with some improvement.  Patient endorses cough chest tightness.  Denies fever/chills, abdominal pain, nausea, vomiting or dizziness.     Past Medical History   Past Medical History:  Diagnosis Date   Asthma    COPD (chronic obstructive pulmonary disease) (HCC)    Depression    GERD (gastroesophageal reflux disease)    Vision loss, left eye      Active Problem List   Patient Active Problem List   Diagnosis Date Noted   Hypocalcemia    Hypokalemia    Glaucoma    Malnutrition of moderate degree 02/20/2021   Acute on chronic respiratory failure with hypoxia (HCC) 02/19/2021   Acute respiratory failure (HCC) 10/06/2020   Tobacco abuse 07/25/2020   HTN (hypertension) 07/25/2020   Acute on chronic respiratory failure with hypercapnia (HCC) 12/06/2019   Nausea vomiting and diarrhea 12/06/2019   Acute respiratory disease due to COVID-19 virus 12/06/2019   COPD (chronic obstructive pulmonary disease) (HCC) 10/30/2019   Hyperglycemia    COPD with acute exacerbation (HCC) 10/29/2019   COPD exacerbation (HCC) 10/29/2019   Epigastric abdominal pain 08/12/2012   Alcohol abuse 03/14/2012   Elevated BP 12/07/2011   ED (erectile dysfunction) 07/28/2011   NICOTINE ADDICTION 10/29/2009   Acute bronchitis 05/07/2009   ERECTILE DYSFUNCTION, NON-ORGANIC 05/04/2009   ANKLE PAIN, RIGHT 10/04/2008   FATIGUE 10/04/2008   Anxiety 02/17/2008   ASTHMA  02/17/2008   GERD (gastroesophageal reflux disease) 02/17/2008   BPH (benign prostatic hyperplasia) 02/17/2008     Past Surgical History   Past Surgical History:  Procedure Laterality Date   EYE SURGERY  2009, about 0865,7846   steel in left eye on the job, legally blind      Home Medications   Prior to Admission medications   Medication Sig Start Date End Date Taking? Authorizing Provider  albuterol (PROVENTIL) (2.5 MG/3ML) 0.083% nebulizer solution Take 2.5 mg by nebulization every 4 (four) hours as needed for wheezing. 06/06/20   [provider]  albuterol (VENTOLIN HFA) 108 (90 Base) MCG/ACT inhaler Inhale 2 puffs into the lungs every 6 (six) hours as needed for wheezing or shortness of breath. 11/30/19   Don Perking, [place], MD  ALPRAZolam Prudy Feeler) 0.25 MG tablet Take 1 tablet (0.25 mg total) by mouth 3 (three) times daily as needed for anxiety. 05/13/21   Alford Highland, MD  citalopram (CELEXA) 10 MG tablet Take 1 tablet (10 mg total) by mouth at bedtime. 05/13/21   Wieting, Richard, MD  DULERA 200-5 MCG/ACT AERO Inhale 2 puffs into the lungs 2 (two) times daily. 02/22/21   Delfino Lovett, MD  Ensure (ENSURE) Take 237 mLs by mouth 2 (two) times daily between meals.    [provider]  erythromycin ophthalmic ointment Place 1 application into the left eye in the morning and at bedtime. 03/22/21   [provider]  guaiFENesin (  MUCINEX) 600 MG 12 hr tablet Take 1 tablet (600 mg total) by mouth 2 (two) times daily. 05/13/21   Alford Highland, MD  magnesium oxide (MAG-OX) 400 (240 Mg) MG tablet Take 1 tablet (400 mg total) by mouth daily. 05/13/21   Alford Highland, MD  montelukast (SINGULAIR) 10 MG tablet Take 1 tablet (10 mg total) by mouth daily. 02/22/21 07/03/21  Delfino Lovett, MD  pantoprazole (PROTONIX) 20 MG tablet Take 1 tablet (20 mg total) by mouth daily. 11/24/20 06/29/21  Lynn Ito, MD  polyethylene glycol-electrolytes (NULYTELY) 420 g solution Drink one 8 oz  glass every 20 mins until entire container is finished starting at 5:00pm on 06/26/21 06/17/21   Midge Minium, MD  [facility] 1-0.2 % SUSP Place 1 drop into the left eye in the morning and at bedtime. 03/22/21   [provider]  tamsulosin (FLOMAX) 0.4 MG CAPS capsule Take 0.4 mg by mouth daily. 11/29/20   [provider]  theophylline (UNIPHYL) 400 MG 24 hr tablet Take 1 tablet (400 mg total) by mouth at bedtime. 05/13/21   Alford Highland, MD     Allergies  Patient has no known allergies.   Family History   Family History  Problem Relation Age of Onset   Diabetes Mother    Hypertension Mother    Stroke Mother    Asthma Father    Diabetes Father    Diabetes Sister    Hypertension Sister    Diabetes Brother    Hypertension Brother      Physical Exam  Triage Vital Signs: ED Triage Vitals  Encounter Vitals Group     BP      Systolic BP Percentile      Diastolic BP Percentile      Pulse      Resp      Temp      Temp src      SpO2      Weight      Height      Head Circumference      Peak Flow      Pain Score      Pain Loc      Pain Education      Exclude from Growth Chart     Updated Vital Signs: There were no vitals taken for this visit.   General: Awake, moderate distress.  CV:  RRR.  Good peripheral perfusion.  Resp:  Increased effort.  Mild retractions.  Scattered rhonchi and wheezing. Abd:  Nontender.  No distention.  Other:  No pedal edema.   ED Results / Procedures / Treatments  Labs (all labs ordered are listed, but only abnormal results are displayed) Labs Reviewed  SARS CORONAVIRUS 2 BY RT PCR  CBC WITH DIFFERENTIAL/PLATELET  COMPREHENSIVE METABOLIC PANEL  TROPONIN I (HIGH SENSITIVITY)     EKG  ED ECG REPORT I, Aliyha Fornes J, the attending physician, personally viewed and interpreted this ECG.   Date: 07/03/2023  EKG Time: 0013  Rate: 87  Rhythm: normal sinus rhythm  Axis: Borderline RAD  Intervals:none  ST&T Change:  Nonspecific    RADIOLOGY Have independently visualized and interpreted patient's x-ray as well as noted the radiology interpretation:  X-ray:  Official radiology report(s): No results found.   PROCEDURES:  Critical Care performed: Yes, see critical care procedure note(s)  CRITICAL CARE Performed by: Irean Hong   Total critical care time: *** minutes  Critical care time was exclusive of separately billable procedures and treating other patients.  Critical  care was necessary to treat or prevent imminent or life-threatening deterioration.  Critical care was time spent personally by me on the following activities: development of treatment plan with patient and/or surrogate as well as nursing, discussions with consultants, evaluation of patient's response to treatment, examination of patient, obtaining history from patient or surrogate, ordering and performing treatments and interventions, ordering and review of laboratory studies, ordering and review of radiographic studies, pulse oximetry and re-evaluation of patient's condition.   Marland Kitchen1-3 Lead EKG Interpretation  Performed by: Irean Hong, MD Authorized by: Irean Hong, MD     Ectopy: none     Conduction: normal   Comments:     Patient placed on cardiac monitor to evaluate for arrhythmias    MEDICATIONS ORDERED IN ED: Medications - No data to display   IMPRESSION / MDM / ASSESSMENT AND PLAN / ED COURSE  I reviewed the triage vital signs and the nursing notes.                             67 year old male who presents with respiratory distress and hypoxia. Differential includes, but is not limited to, viral syndrome, bronchitis including COPD exacerbation, pneumonia, reactive airway disease including asthma, CHF including exacerbation with or without pulmonary/interstitial edema, pneumothorax, ACS, thoracic trauma, and pulmonary embolism.  I personally reviewed patient's records and note a pulmonology office visit on  08/19/2022.  Patient's presentation is most consistent with acute presentation with potential threat to life or bodily function.  The patient is on the cardiac monitor to evaluate for evidence of arrhythmia and/or significant heart rate changes.  Obtain lab work, COVID swab, chest x-ray.  Add 2 g IV magnesium.  Will reassess.      FINAL CLINICAL IMPRESSION(S) / ED DIAGNOSES   Final diagnoses:  Chronic obstructive pulmonary disease, unspecified COPD type (HCC)  Hypoxia     Rx / DC Orders   ED Discharge Orders     None        Note:  This document was prepared using Dragon voice recognition software and may include unintentional dictation errors.

## 2023-07-04 ENCOUNTER — Emergency Department: Payer: Medicare Other

## 2023-07-04 ENCOUNTER — Other Ambulatory Visit: Payer: Self-pay

## 2023-07-04 ENCOUNTER — Other Ambulatory Visit: Payer: Medicaid Other

## 2023-07-04 DIAGNOSIS — J441 Chronic obstructive pulmonary disease with (acute) exacerbation: Principal | ICD-10-CM

## 2023-07-04 DIAGNOSIS — J9611 Chronic respiratory failure with hypoxia: Secondary | ICD-10-CM

## 2023-07-04 LAB — CBC WITH DIFFERENTIAL/PLATELET
Abs Immature Granulocytes: 0.03 10*3/uL (ref 0.00–0.07)
Basophils Absolute: 0.1 10*3/uL (ref 0.0–0.1)
Basophils Relative: 1 %
Eosinophils Absolute: 0.4 10*3/uL (ref 0.0–0.5)
Eosinophils Relative: 5 %
HCT: 43.4 % (ref 39.0–52.0)
Hemoglobin: 14.1 g/dL (ref 13.0–17.0)
Immature Granulocytes: 0 %
Lymphocytes Relative: 45 %
Lymphs Abs: 3.5 10*3/uL (ref 0.7–4.0)
MCH: 28.7 pg (ref 26.0–34.0)
MCHC: 32.5 g/dL (ref 30.0–36.0)
MCV: 88.4 fL (ref 80.0–100.0)
Monocytes Absolute: 0.5 10*3/uL (ref 0.1–1.0)
Monocytes Relative: 6 %
Neutro Abs: 3.4 10*3/uL (ref 1.7–7.7)
Neutrophils Relative %: 43 %
Platelets: 238 10*3/uL (ref 150–400)
RBC: 4.91 MIL/uL (ref 4.22–5.81)
RDW: 13.6 % (ref 11.5–15.5)
WBC: 7.9 10*3/uL (ref 4.0–10.5)
nRBC: 0 % (ref 0.0–0.2)

## 2023-07-04 LAB — COMPREHENSIVE METABOLIC PANEL
ALT: 21 U/L (ref 0–44)
AST: 22 U/L (ref 15–41)
Albumin: 4.2 g/dL (ref 3.5–5.0)
Alkaline Phosphatase: 85 U/L (ref 38–126)
Anion gap: 11 (ref 5–15)
BUN: 16 mg/dL (ref 8–23)
CO2: 27 mmol/L (ref 22–32)
Calcium: 8.4 mg/dL — ABNORMAL LOW (ref 8.9–10.3)
Chloride: 101 mmol/L (ref 98–111)
Creatinine, Ser: 0.86 mg/dL (ref 0.61–1.24)
GFR, Estimated: >60 mL/min (ref >60)
Glucose, Bld: 121 mg/dL — ABNORMAL HIGH (ref 70–99)
Potassium: 4.1 mmol/L (ref 3.5–5.1)
Sodium: 139 mmol/L (ref 135–145)
Total Bilirubin: 0.6 mg/dL (ref 0.3–1.2)
Total Protein: 7.1 g/dL (ref 6.5–8.1)

## 2023-07-04 LAB — TROPONIN I (HIGH SENSITIVITY)
Troponin I (High Sensitivity): 7 ng/L (ref <18)
Troponin I (High Sensitivity): 9 ng/L (ref <18)

## 2023-07-04 LAB — HIV ANTIBODY (ROUTINE TESTING W REFLEX): HIV Screen 4th Generation wRfx: NONREACTIVE

## 2023-07-04 LAB — MAGNESIUM: Magnesium: 2.2 mg/dL (ref 1.7–2.4)

## 2023-07-04 LAB — SARS CORONAVIRUS 2 BY RT PCR: SARS Coronavirus 2 by RT PCR: NEGATIVE

## 2023-07-04 MED ORDER — IPRATROPIUM-ALBUTEROL 0.5-2.5 (3) MG/3ML IN SOLN
3.0000 mL | Freq: Four times a day (QID) | RESPIRATORY_TRACT | Status: DC
  Administered 2023-07-04 (×3): 3 mL via RESPIRATORY_TRACT
  Filled 2023-07-04 (×3): qty 3

## 2023-07-04 MED ORDER — ACETAMINOPHEN 325 MG PO TABS
650.0000 mg | ORAL_TABLET | Freq: Four times a day (QID) | ORAL | Status: DC | PRN
  Administered 2023-07-04: 650 mg via ORAL
  Filled 2023-07-04: qty 2

## 2023-07-04 MED ORDER — GUAIFENESIN ER 600 MG PO TB12
600.0000 mg | ORAL_TABLET | Freq: Two times a day (BID) | ORAL | Status: DC
  Administered 2023-07-04 – 2023-07-05 (×4): 600 mg via ORAL
  Filled 2023-07-04 (×4): qty 1

## 2023-07-04 MED ORDER — ONDANSETRON HCL 4 MG/2ML IJ SOLN: 4.0000 mg | Freq: Four times a day (QID) | INTRAMUSCULAR | Status: DC | PRN

## 2023-07-04 MED ORDER — ALBUTEROL SULFATE (2.5 MG/3ML) 0.083% IN NEBU
2.5000 mg | INHALATION_SOLUTION | RESPIRATORY_TRACT | Status: DC | PRN
  Administered 2023-07-04 – 2023-07-05 (×2): 2.5 mg via RESPIRATORY_TRACT
  Filled 2023-07-04 (×2): qty 3

## 2023-07-04 MED ORDER — PREDNISONE 20 MG PO TABS
40.0000 mg | ORAL_TABLET | Freq: Every day | ORAL | Status: DC
  Administered 2023-07-05: 40 mg via ORAL
  Filled 2023-07-04: qty 2

## 2023-07-04 MED ORDER — ACETAMINOPHEN 325 MG PO TABS: 650.0000 mg | ORAL_TABLET | Freq: Four times a day (QID) | ORAL | Status: DC | PRN

## 2023-07-04 MED ORDER — ERYTHROMYCIN 5 MG/GM OP OINT
1.0000 | TOPICAL_OINTMENT | Freq: Every day | OPHTHALMIC | Status: DC
  Administered 2023-07-04: 1 via OPHTHALMIC
  Filled 2023-07-04: qty 1

## 2023-07-04 MED ORDER — TAMSULOSIN HCL 0.4 MG PO CAPS
0.4000 mg | ORAL_CAPSULE | Freq: Every day | ORAL | Status: DC
  Administered 2023-07-05: 0.4 mg via ORAL
  Filled 2023-07-04: qty 1

## 2023-07-04 MED ORDER — CITALOPRAM HYDROBROMIDE 20 MG PO TABS
10.0000 mg | ORAL_TABLET | Freq: Every day | ORAL | Status: DC
  Administered 2023-07-04: 10 mg via ORAL
  Filled 2023-07-04: qty 1
  Filled 2023-07-04: qty 0.5

## 2023-07-04 MED ORDER — BRIMONIDINE TARTRATE 0.2 % OP SOLN
1.0000 [drp] | Freq: Three times a day (TID) | OPHTHALMIC | Status: DC
  Administered 2023-07-04 – 2023-07-05 (×2): 1 [drp] via OPHTHALMIC
  Filled 2023-07-04: qty 5

## 2023-07-04 MED ORDER — ORAL CARE MOUTH RINSE: 15.0000 mL | OROMUCOSAL | Status: DC | PRN

## 2023-07-04 MED ORDER — MAGNESIUM OXIDE -MG SUPPLEMENT 400 (240 MG) MG PO TABS
400.0000 mg | ORAL_TABLET | Freq: Every day | ORAL | Status: DC
  Administered 2023-07-04 – 2023-07-05 (×2): 400 mg via ORAL
  Filled 2023-07-04 (×2): qty 1

## 2023-07-04 MED ORDER — HYDROCOD POLI-CHLORPHE POLI ER 10-8 MG/5ML PO SUER
5.0000 mL | Freq: Once | ORAL | Status: AC
  Administered 2023-07-04: 5 mL via ORAL
  Filled 2023-07-04: qty 5

## 2023-07-04 MED ORDER — METHYLPREDNISOLONE SODIUM SUCC 40 MG IJ SOLR
40.0000 mg | Freq: Two times a day (BID) | INTRAMUSCULAR | Status: AC
  Administered 2023-07-04 (×2): 40 mg via INTRAVENOUS
  Filled 2023-07-04 (×2): qty 1

## 2023-07-04 MED ORDER — IPRATROPIUM-ALBUTEROL 0.5-2.5 (3) MG/3ML IN SOLN
3.0000 mL | Freq: Once | RESPIRATORY_TRACT | Status: AC
  Administered 2023-07-04: 3 mL via RESPIRATORY_TRACT
  Filled 2023-07-04: qty 3

## 2023-07-04 MED ORDER — ENOXAPARIN SODIUM 40 MG/0.4ML IJ SOSY
40.0000 mg | PREFILLED_SYRINGE | INTRAMUSCULAR | Status: DC
  Administered 2023-07-04 – 2023-07-05 (×2): 40 mg via SUBCUTANEOUS
  Filled 2023-07-04 (×2): qty 0.4

## 2023-07-04 MED ORDER — SENNOSIDES-DOCUSATE SODIUM 8.6-50 MG PO TABS
1.0000 | ORAL_TABLET | Freq: Two times a day (BID) | ORAL | Status: DC
  Administered 2023-07-04 – 2023-07-05 (×3): 1 via ORAL
  Filled 2023-07-04 (×3): qty 1

## 2023-07-04 MED ORDER — ONDANSETRON HCL 4 MG PO TABS: 4.0000 mg | ORAL_TABLET | Freq: Four times a day (QID) | ORAL | Status: DC | PRN

## 2023-07-04 MED ORDER — AMLODIPINE BESYLATE 10 MG PO TABS
10.0000 mg | ORAL_TABLET | Freq: Every day | ORAL | Status: DC
  Administered 2023-07-04 – 2023-07-05 (×2): 10 mg via ORAL
  Filled 2023-07-04 (×2): qty 1

## 2023-07-04 MED ORDER — BRINZOLAMIDE 1 % OP SUSP
1.0000 [drp] | Freq: Three times a day (TID) | OPHTHALMIC | Status: DC
  Administered 2023-07-04 – 2023-07-05 (×2): 1 [drp] via OPHTHALMIC
  Filled 2023-07-04: qty 10

## 2023-07-04 MED ORDER — ALPRAZOLAM 0.5 MG PO TABS
0.2500 mg | ORAL_TABLET | Freq: Three times a day (TID) | ORAL | Status: DC | PRN
  Administered 2023-07-04 – 2023-07-05 (×2): 0.25 mg via ORAL
  Filled 2023-07-04 (×3): qty 1

## 2023-07-04 MED ORDER — ACETAMINOPHEN 650 MG RE SUPP: 650.0000 mg | Freq: Four times a day (QID) | RECTAL | Status: DC | PRN

## 2023-07-04 MED ORDER — POLYETHYLENE GLYCOL 3350 17 G PO PACK
17.0000 g | PACK | Freq: Every day | ORAL | Status: DC
  Administered 2023-07-04 – 2023-07-05 (×2): 17 g via ORAL
  Filled 2023-07-04 (×2): qty 1

## 2023-07-04 MED ORDER — PANTOPRAZOLE SODIUM 20 MG PO TBEC
20.0000 mg | DELAYED_RELEASE_TABLET | Freq: Every day | ORAL | Status: DC
  Administered 2023-07-04 – 2023-07-05 (×2): 20 mg via ORAL
  Filled 2023-07-04 (×2): qty 1

## 2023-07-04 MED ORDER — MONTELUKAST SODIUM 10 MG PO TABS
10.0000 mg | ORAL_TABLET | Freq: Every day | ORAL | Status: DC
  Administered 2023-07-04 – 2023-07-05 (×2): 10 mg via ORAL
  Filled 2023-07-04 (×2): qty 1

## 2023-07-04 MED ORDER — THEOPHYLLINE ER 400 MG PO TB24
400.0000 mg | ORAL_TABLET | Freq: Every day | ORAL | Status: DC
  Administered 2023-07-04: 400 mg via ORAL
  Filled 2023-07-04: qty 1

## 2023-07-04 NOTE — Progress Notes (Signed)
Received patient from ED to room 132 at 0300. Patient is alert and able to verbalize needs. Sats at 95% on 3L via [place]. Noted with unsteady gait during transfer. Skin assessment done, skin is unremarkable. Complained of 3/10 headache, Tylenol 650 mg was administered. Patient was oriented to room, bed alarm engaged, and call bell placed within reach.

## 2023-07-04 NOTE — Plan of Care (Signed)
  Problem: Respiratory: Goal: Ability to maintain a clear airway will improve Outcome: Progressing Goal: Levels of oxygenation will improve Outcome: Progressing Goal: Ability to maintain adequate ventilation will improve Outcome: Progressing   Problem: Clinical Measurements: Goal: Ability to maintain clinical measurements within normal limits will improve Outcome: Progressing Goal: Will remain free from infection Outcome: Progressing Goal: Diagnostic test results will improve Outcome: Progressing Goal: Respiratory complications will improve Outcome: Progressing Goal: Cardiovascular complication will be avoided Outcome: Progressing   Problem: Pain Managment: Goal: General experience of comfort will improve Outcome: Progressing   Problem: Safety: Goal: Ability to remain free from injury will improve Outcome: Progressing

## 2023-07-04 NOTE — Assessment & Plan Note (Addendum)
Chronic respiratory failure Scheduled and as needed nebulized bronchodilators IV steroids Antitussives, flutter valve

## 2023-07-04 NOTE — Plan of Care (Signed)

## 2023-07-04 NOTE — H&P (Addendum)
History and Physical    Patient: Bryan Kelley UJW:[id number] DOB: 05/31/1956 DOA: 07/03/2023 DOS: the patient was seen and examined on 07/04/2023 PCP: [name]  Patient coming from: Home  Chief Complaint:  Chief Complaint  Patient presents with   Shortness of Breath    Pt presents to ER via EMS from home for SOB that started yesterday. Pt has COPD and is on 3l [place] at home. Pt denies CP. Per EMS pt was 88% on 3l [place] at home. Pt is a and ox4. Pt resp are even and unlabored. Pt skin is dry and warm and appropriate for ethnicity.     HPI: Bryan Kelley is a 67 y.o. male with medical history significant for COPD on home O2 at 3 L, and anxiety and depression who presents to the ED with a s 2-day history of cough and shortness of breath not responding to home nebulizers.  He denies fever or chills, leg pain or swelling.  Denies chest pain.  EMS recorded O2 sat of 88% on O2 at home flow rate.  He received Solu-Medrol and 2 nebulizers and route. ED course and data review: On arrival tachypneic to 22 with BP 132/112 and otherwise normal vitals. Labs: CBC WNL, troponin 9, COVID-negative.  CMP unremarkable, mag 2.2 EKG, personally viewed and interpreted showing sinus rhythm at 87 with nonspecific ST-T wave changes.  Chest x-ray clear. Patient treated with an additional 2 DuoNebs and given IV magnesium and Tussionex Hospitalist consulted for admission.   Review of Systems: As mentioned in the history of present illness. All other systems reviewed and are negative.  Past Medical History:  Diagnosis Date   Asthma    COPD (chronic obstructive pulmonary disease) (HCC)    Depression    GERD (gastroesophageal reflux disease)    Vision loss, left eye    Past Surgical History:  Procedure Laterality Date   EYE SURGERY  2009, about 5621,3086   steel in left eye on the job, legally blind    Social History:  reports that he quit smoking about 16 months ago. His smoking use  included cigarettes. He started smoking about 50 years ago. He has a 24.5 pack-year smoking history. He has never used smokeless tobacco. He reports current alcohol use of about 2.0 standard drinks of alcohol per week. He reports that he does not use drugs.  No Known Allergies  Family History  Problem Relation Age of Onset   Diabetes Mother    Hypertension Mother    Stroke Mother    Asthma Father    Diabetes Father    Diabetes Sister    Hypertension Sister    Diabetes Brother    Hypertension Brother     Prior to Admission medications   Medication Sig Start Date End Date Taking? Authorizing Provider  albuterol (PROVENTIL) (2.5 MG/3ML) 0.083% nebulizer solution Take 2.5 mg by nebulization every 4 (four) hours as needed for wheezing. 06/06/20   [provider]  albuterol (VENTOLIN HFA) 108 (90 Base) MCG/ACT inhaler Inhale 2 puffs into the lungs every 6 (six) hours as needed for wheezing or shortness of breath. 11/30/19   Don Perking, [place], MD  ALPRAZolam Prudy Feeler) 0.25 MG tablet Take 1 tablet (0.25 mg total) by mouth 3 (three) times daily as needed for anxiety. 05/13/21   Alford Highland, MD  citalopram (CELEXA) 10 MG tablet Take 1 tablet (10 mg total) by mouth at bedtime. 05/13/21   Alford Highland, MD  DULERA 200-5 MCG/ACT AERO  Inhale 2 puffs into the lungs 2 (two) times daily. 02/22/21   Delfino Lovett, MD  Ensure (ENSURE) Take 237 mLs by mouth 2 (two) times daily between meals.    [provider]  erythromycin ophthalmic ointment Place 1 application into the left eye in the morning and at bedtime. 03/22/21   [provider]  guaiFENesin (MUCINEX) 600 MG 12 hr tablet Take 1 tablet (600 mg total) by mouth 2 (two) times daily. 05/13/21   Alford Highland, MD  magnesium oxide (MAG-OX) 400 (240 Mg) MG tablet Take 1 tablet (400 mg total) by mouth daily. 05/13/21   Alford Highland, MD  montelukast (SINGULAIR) 10 MG tablet Take 1 tablet (10 mg total) by mouth daily. 02/22/21  07/03/21  Delfino Lovett, MD  pantoprazole (PROTONIX) 20 MG tablet Take 1 tablet (20 mg total) by mouth daily. 11/24/20 06/29/21  Lynn Ito, MD  polyethylene glycol-electrolytes (NULYTELY) 420 g solution Drink one 8 oz glass every 20 mins until entire container is finished starting at 5:00pm on 06/26/21 06/17/21   Midge Minium, MD  [facility] 1-0.2 % SUSP Place 1 drop into the left eye in the morning and at bedtime. 03/22/21   [provider]  tamsulosin (FLOMAX) 0.4 MG CAPS capsule Take 0.4 mg by mouth daily. 11/29/20   [provider]  theophylline (UNIPHYL) 400 MG 24 hr tablet Take 1 tablet (400 mg total) by mouth at bedtime. 05/13/21   Alford Highland, MD    Physical Exam: Vitals:   07/04/23 0030 07/04/23 0045 07/04/23 0100 07/04/23 0115  BP: 132/89     Pulse: 86 81 86 79  Resp:      Temp:      TempSrc:      SpO2: 99%  100% 100%  Weight:      Height:       Physical Exam Vitals and nursing note reviewed.  Constitutional:      General: He is not in acute distress. HENT:     Head: Normocephalic and atraumatic.  Cardiovascular:     Rate and Rhythm: Normal rate and regular rhythm.     Heart sounds: Normal heart sounds.  Pulmonary:     Effort: Pulmonary effort is normal.     Breath sounds: Wheezing and rhonchi present.  Abdominal:     Palpations: Abdomen is soft.     Tenderness: There is no abdominal tenderness.  Neurological:     Mental Status: Mental status is at baseline.     Labs on Admission: I have personally reviewed following labs and imaging studies  CBC: Recent Labs  Lab 07/04/23 0010  WBC 7.9  NEUTROABS 3.4  HGB 14.1  HCT 43.4  MCV 88.4  PLT 238   Basic Metabolic Panel: Recent Labs  Lab 07/04/23 0010  NA 139  K 4.1  CL 101  CO2 27  GLUCOSE 121*  BUN 16  CREATININE 0.86  CALCIUM 8.4*  MG 2.2   GFR: Estimated Creatinine Clearance: 87.2 mL/min (by C-G formula based on SCr of 0.86 mg/dL). Liver Function Tests: Recent Labs  Lab  07/04/23 0010  AST 22  ALT 21  ALKPHOS 85  BILITOT 0.6  PROT 7.1  ALBUMIN 4.2   No results for input(s): "LIPASE", "AMYLASE" in the last 168 hours. No results for input(s): "AMMONIA" in the last 168 hours. Coagulation Profile: No results for input(s): "INR", "PROTIME" in the last 168 hours. Cardiac Enzymes: No results for input(s): "CKTOTAL", "CKMB", "CKMBINDEX", "TROPONINI" in the last 168 hours. BNP (  last 3 results) No results for input(s): "PROBNP" in the last 8760 hours. HbA1C: No results for input(s): "HGBA1C" in the last 72 hours. CBG: No results for input(s): "GLUCAP" in the last 168 hours. Lipid Profile: No results for input(s): "CHOL", "HDL", "LDLCALC", "TRIG", "CHOLHDL", "LDLDIRECT" in the last 72 hours. Thyroid Function Tests: No results for input(s): "TSH", "T4TOTAL", "FREET4", "T3FREE", "THYROIDAB" in the last 72 hours. Anemia Panel: No results for input(s): "VITAMINB12", "FOLATE", "FERRITIN", "TIBC", "IRON", "RETICCTPCT" in the last 72 hours. Urine analysis: No results found for: "COLORURINE", "APPEARANCEUR", "LABSPEC", "PHURINE", "GLUCOSEU", "HGBUR", "BILIRUBINUR", "KETONESUR", "PROTEINUR", "UROBILINOGEN", "NITRITE", "LEUKOCYTESUR"  Radiological Exams on Admission: No results found.   Data Reviewed: Relevant notes from primary care and specialist visits, past discharge summaries as available in EHR, including Care Everywhere. Prior diagnostic testing as pertinent to current admission diagnoses Updated medications and problem lists for reconciliation ED course, including vitals, labs, imaging, treatment and response to treatment Triage notes, nursing and pharmacy notes and ED provider's notes Notable results as noted in HPI   Assessment and Plan: * COPD with acute exacerbation (HCC) Chronic respiratory failure Scheduled and as needed nebulized bronchodilators IV steroids Antitussives, flutter valve  Anxiety Continue home meds    DVT prophylaxis:  Lovenox  Consults: none  Advance Care Planning:   Code Status: Prior   Family Communication: none  Disposition Plan: Back to previous home environment  Severity of Illness: The appropriate patient status for this patient is OBSERVATION. Observation status is judged to be reasonable and necessary in order to provide the required intensity of service to ensure the patient's safety. The patient's presenting symptoms, physical exam findings, and initial radiographic and laboratory data in the context of their medical condition is felt to place them at decreased risk for further clinical deterioration. Furthermore, it is anticipated that the patient will be medically stable for discharge from the hospital within 2 midnights of admission.   Author: Andris Baumann, MD 07/04/2023 2:26 AM  For on call review [email or web address].

## 2023-07-04 NOTE — Assessment & Plan Note (Signed)
-   Continue home meds °

## 2023-07-04 NOTE — Progress Notes (Signed)
   Bryan Kelley  XBJ:[id number] DOB: Jun 13, 1956 DOA: 07/03/2023 [facility]: [name]    Brief Narrative:  67 year old with a history of COPD on home O2 at 3 L nasal cannula, anxiety/depression, and GERD who presented to the ER 8/30 with a 2-day history of progressively worsening cough with shortness of breath not responding to his home nebulizer therapies.  EMS reported oxygen saturation of 88% despite his home 3 L nasal cannula supplement.  On arrival to the ER the patient was tachypneic but otherwise appeared stable.  Chest x-ray was without a focal infiltrate.  Goals of Care:   Code Status: Full Code   DVT prophylaxis: enoxaparin (LOVENOX) injection 40 mg Start: 07/04/23 1000   Interim Hx: Patient was interviewed and examined by one of my partners earlier today.  Assessment & Plan:  Acute bronchospastic exacerbation of COPD Continue nebulized medications and IV steroids  Acute on chronic hypoxic respiratory failure Requires 3 L nasal cannula O2 support at baseline at home -   Anxiety/depression Continue usual home medications   Family Communication: No family present at time of follow-up exam Disposition: Anticipate discharge home once respiratory status stabilized   Objective: Blood pressure (!) 148/88, pulse 89, temperature 98.5 F (36.9 C), resp. rate 18, height 5\' 10"  (1.778 m), weight 77.1 kg, SpO2 100%.  Intake/Output Summary (Last 24 hours) at 07/04/2023 0813 Last data filed at 07/04/2023 0130 Gross per 24 hour  Intake 50 ml  Output --  Net 50 ml   Filed Weights   07/04/23 0003  Weight: 77.1 kg    Examination: Patient was examined by one of my partners earlier today and seen for a follow-up exam.  CBC: Recent Labs  Lab 07/04/23 0010  WBC 7.9  NEUTROABS 3.4  HGB 14.1  HCT 43.4  MCV 88.4  PLT 238   Basic Metabolic Panel: Recent Labs  Lab 07/04/23 0010  NA 139  K 4.1  CL 101  CO2 27  GLUCOSE 121*  BUN 16  CREATININE 0.86   CALCIUM 8.4*  MG 2.2   GFR: Estimated Creatinine Clearance: 87.2 mL/min (by C-G formula based on SCr of 0.86 mg/dL).   Scheduled Meds:  citalopram  10 mg Oral QHS   enoxaparin (LOVENOX) injection  40 mg Subcutaneous Q24H   guaiFENesin  600 mg Oral BID   ipratropium-albuterol  3 mL Nebulization Q6H   methylPREDNISolone (SOLU-MEDROL) injection  40 mg Intravenous Q12H   Followed by   Melene Muller ON 07/05/2023] predniSONE  40 mg Oral Q breakfast   tamsulosin  0.4 mg Oral Daily     LOS: 0 days   Lonia Blood, MD [facility] Office  [phone] Pager - Text Page per Loretha Stapler  If 7PM-7AM, please contact night-coverage per Amion 07/04/2023, 8:13 AM

## 2023-07-05 ENCOUNTER — Observation Stay: Payer: Medicare Other

## 2023-07-05 DIAGNOSIS — J441 Chronic obstructive pulmonary disease with (acute) exacerbation: Secondary | ICD-10-CM | POA: Diagnosis not present

## 2023-07-05 MED ORDER — TAMSULOSIN HCL 0.4 MG PO CAPS: 0.4000 mg | ORAL_CAPSULE | Freq: Every day | ORAL | 0 refills | Status: AC

## 2023-07-05 MED ORDER — PREDNISONE 20 MG PO TABS: 40.0000 mg | ORAL_TABLET | Freq: Every day | ORAL | 0 refills | Status: AC

## 2023-07-05 MED ORDER — SIMBRINZA 1-0.2 % OP SUSP: 1.0000 [drp] | Freq: Two times a day (BID) | OPHTHALMIC | 0 refills | Status: DC

## 2023-07-05 MED ORDER — ACETAMINOPHEN 325 MG PO TABS: 650.0000 mg | ORAL_TABLET | Freq: Four times a day (QID) | ORAL | Status: DC | PRN

## 2023-07-05 MED ORDER — IPRATROPIUM-ALBUTEROL 0.5-2.5 (3) MG/3ML IN SOLN
3.0000 mL | Freq: Three times a day (TID) | RESPIRATORY_TRACT | Status: DC
  Administered 2023-07-05: 3 mL via RESPIRATORY_TRACT
  Filled 2023-07-05: qty 3

## 2023-07-05 MED ORDER — ALPRAZOLAM 0.25 MG PO TABS: 0.2500 mg | ORAL_TABLET | Freq: Three times a day (TID) | ORAL | 0 refills | Status: DC | PRN

## 2023-07-05 MED ORDER — POLYETHYLENE GLYCOL 3350 17 G PO PACK: 17.0000 g | PACK | Freq: Every day | ORAL | 0 refills | Status: DC

## 2023-07-05 MED ORDER — SENNOSIDES-DOCUSATE SODIUM 8.6-50 MG PO TABS: 1.0000 | ORAL_TABLET | Freq: Two times a day (BID) | ORAL | Status: DC

## 2023-07-05 NOTE — Plan of Care (Signed)
Problem: Education: Goal: Knowledge of disease or condition will improve 07/05/2023 1111 by Latanya Maudlin, RN Outcome: Completed/Met 07/05/2023 1111 by Latanya Maudlin, RN Outcome: Not Progressing Goal: Knowledge of the prescribed therapeutic regimen will improve 07/05/2023 1111 by Latanya Maudlin, RN Outcome: Completed/Met 07/05/2023 1111 by Latanya Maudlin, RN Outcome: Not Progressing Goal: Individualized Educational Video(s) 07/05/2023 1111 by Latanya Maudlin, RN Outcome: Completed/Met 07/05/2023 1111 by Latanya Maudlin, RN Outcome: Not Progressing   Problem: Activity: Goal: Ability to tolerate increased activity will improve 07/05/2023 1111 by Latanya Maudlin, RN Outcome: Completed/Met 07/05/2023 1111 by Latanya Maudlin, RN Outcome: Not Progressing Goal: Will verbalize the importance of balancing activity with adequate rest periods 07/05/2023 1111 by Latanya Maudlin, RN Outcome: Completed/Met 07/05/2023 1111 by Latanya Maudlin, RN Outcome: Not Progressing   Problem: Respiratory: Goal: Ability to maintain a clear airway will improve 07/05/2023 1111 by Latanya Maudlin, RN Outcome: Completed/Met 07/05/2023 1111 by Latanya Maudlin, RN Outcome: Not Progressing Goal: Levels of oxygenation will improve 07/05/2023 1111 by Latanya Maudlin, RN Outcome: Completed/Met 07/05/2023 1111 by Latanya Maudlin, RN Outcome: Not Progressing Goal: Ability to maintain adequate ventilation will improve 07/05/2023 1111 by Latanya Maudlin, RN Outcome: Completed/Met 07/05/2023 1111 by Latanya Maudlin, RN Outcome: Not Progressing   Problem: Education: Goal: Knowledge of General Education information will improve Description: Including pain rating scale, medication(s)/side effects and non-pharmacologic comfort measures 07/05/2023 1111 by Latanya Maudlin, RN Outcome: Completed/Met 07/05/2023 1111 by Latanya Maudlin, RN Outcome: Not Progressing   Problem: Health Behavior/Discharge Planning: Goal:  Ability to manage health-related needs will improve 07/05/2023 1111 by Latanya Maudlin, RN Outcome: Completed/Met 07/05/2023 1111 by Latanya Maudlin, RN Outcome: Not Progressing   Problem: Clinical Measurements: Goal: Ability to maintain clinical measurements within normal limits will improve 07/05/2023 1111 by Latanya Maudlin, RN Outcome: Completed/Met 07/05/2023 1111 by Latanya Maudlin, RN Outcome: Not Progressing Goal: Will remain free from infection 07/05/2023 1111 by Latanya Maudlin, RN Outcome: Completed/Met 07/05/2023 1111 by Latanya Maudlin, RN Outcome: Not Progressing Goal: Diagnostic test results will improve 07/05/2023 1111 by Latanya Maudlin, RN Outcome: Completed/Met 07/05/2023 1111 by Latanya Maudlin, RN Outcome: Not Progressing Goal: Respiratory complications will improve 07/05/2023 1111 by Latanya Maudlin, RN Outcome: Completed/Met 07/05/2023 1111 by Latanya Maudlin, RN Outcome: Not Progressing Goal: Cardiovascular complication will be avoided 07/05/2023 1111 by Latanya Maudlin, RN Outcome: Completed/Met 07/05/2023 1111 by Latanya Maudlin, RN Outcome: Not Progressing   Problem: Activity: Goal: Risk for activity intolerance will decrease 07/05/2023 1111 by Latanya Maudlin, RN Outcome: Completed/Met 07/05/2023 1111 by Latanya Maudlin, RN Outcome: Not Progressing   Problem: Nutrition: Goal: Adequate nutrition will be maintained 07/05/2023 1111 by Latanya Maudlin, RN Outcome: Completed/Met 07/05/2023 1111 by Latanya Maudlin, RN Outcome: Not Progressing   Problem: Coping: Goal: Level of anxiety will decrease 07/05/2023 1111 by Latanya Maudlin, RN Outcome: Completed/Met 07/05/2023 1111 by Latanya Maudlin, RN Outcome: Not Progressing   Problem: Elimination: Goal: Will not experience complications related to bowel motility 07/05/2023 1111 by Latanya Maudlin, RN Outcome: Completed/Met 07/05/2023 1111 by Latanya Maudlin, RN Outcome: Not Progressing Goal: Will not  experience complications related to urinary retention 07/05/2023 1111 by Latanya Maudlin, RN Outcome: Completed/Met 07/05/2023 1111 by Latanya Maudlin, RN Outcome: Not Progressing   Problem: Pain Managment: Goal: General experience of comfort will improve 07/05/2023 1111 by  Latanya Maudlin, RN Outcome: Completed/Met 07/05/2023 1111 by Latanya Maudlin, RN Outcome: Not Progressing   Problem: Safety: Goal: Ability to remain free from injury will improve 07/05/2023 1111 by Latanya Maudlin, RN Outcome: Completed/Met 07/05/2023 1111 by Latanya Maudlin, RN Outcome: Not Progressing   Problem: Skin Integrity: Goal: Risk for impaired skin integrity will decrease 07/05/2023 1111 by Latanya Maudlin, RN Outcome: Completed/Met 07/05/2023 1111 by Latanya Maudlin, RN Outcome: Not Progressing

## 2023-07-05 NOTE — Discharge Summary (Signed)
DISCHARGE SUMMARY  Bryan Kelley  MR#: [id number]  DOB:[birth year]  Date of Admission: 07/03/2023 Date of Discharge: 07/05/2023  Attending Physician:Jemarcus Dougal Silvestre Gunner, MD  Patient's [name]  Disposition: D/C home   Follow-up Appts:  Follow-up Information     [name] Follow up in 5 day(s).   Specialty: General Practice Contact information: [address] [place] [address] [phone]                 Tests Needing Follow-up: -needs lipid monitoring in outpt setting given finding of vascular disease on CT chest  -arrange for f/u CT chest for lung nodule monitoring in 3-6 months   Discharge Diagnoses: Acute bronchospastic exacerbation of COPD Acute on chronic hypoxic respiratory failure Anxiety/depression 6.3 mm left upper lobe pulmonary nodule  Atherosclerotic calcification of aorta and coronary arteries  Initial presentation: 67 year old with a history of COPD on home O2 at 3 L nasal cannula, anxiety/depression, and GERD who presented to the ER 8/30 with a 2-day history of progressively worsening cough with shortness of breath not responding to his home nebulizer therapies. EMS reported oxygen saturation of 88% despite his home 3 L nasal cannula supplement. On arrival to the ER the patient was tachypneic but otherwise appeared stable. Chest x-ray was without a focal infiltrate. Exam revealed wheezing.   Hospital Course:  Acute bronchospastic exacerbation of COPD Rapidly improved w/ nebulized medications and IV steroids - transitioned to oral prednisone prior to d/c with short course to be completed as outpt    Acute on chronic hypoxic respiratory failure Requires 3 L nasal cannula O2 support at baseline at home - rapidly improved w/ tx of above and was able to be titrated back to his home O2 dose prior to d/c ome    Anxiety/depression Continue usual home medications - well  compensated presently - pt reports that his PCP recently left town leaving him with no way to get his chronic xanax refilled - ~5-10 day supply of xanax prescribed, with patient instructed that he will need a PCP for further prescriptions in the outpt setting - TOC consulted to assist w/ obtaining a PCP    6.3 mm left upper lobe pulmonary nodule  Noted on lung cancer screening CT chest October 2023 with recommendation at that time to follow-up in 3 months -there is no record in the chart that follow-up was accomplished - f/u CT chest obtained during this admission and a favorable report as follows: The previously noted peribronchovascular nodule within the left mid lung appears less conspicuous measuring 4 mm on today's study versus 6 mm previously. There is a similar appearing area of focal bronchial dilatation with adjacent nodular density within the posteromedial left mid lung measuring 5 mm. Given the location of both these nodular densities along with adjacent bronchiolectasis imaging findings are favored to represent areas of retained mucoid impaction within dilated bronchi. Malignancy is considered less favored. Recommend repeat outpatient lung cancer screening CT and 3-6 months to readdress these nodular densities.   Atherosclerotic calcification of aorta and coronary arteries Noted incidentally on CT chest that was obtained for lung cancer screening -asymptomatic at present -check fasting lipid panel in outpt f/u as pt may benefit from a statin   Allergies as of 07/05/2023   No Known Allergies      Medication List     STOP taking these medications    citalopram 10 MG tablet Commonly known as: CELEXA  Ensure   erythromycin ophthalmic ointment   polyethylene glycol-electrolytes 420 g solution Commonly known as: NuLYTELY       TAKE these medications    acetaminophen 325 MG tablet Commonly known as: TYLENOL Take 2 tablets (650 mg total) by mouth every 6 (six) hours as needed  for mild pain, fever or headache.   albuterol 108 (90 Base) MCG/ACT inhaler Commonly known as: VENTOLIN HFA Inhale 2 puffs into the lungs every 6 (six) hours as needed for wheezing or shortness of breath.   albuterol (2.5 MG/3ML) 0.083% nebulizer solution Commonly known as: PROVENTIL Take 2.5 mg by nebulization every 4 (four) hours as needed for wheezing.   ALPRAZolam 0.25 MG tablet Commonly known as: XANAX Take 1 tablet (0.25 mg total) by mouth 3 (three) times daily as needed for anxiety.   amLODipine 10 MG tablet Commonly known as: NORVASC Take 10 mg by mouth daily.   Dulera 200-5 MCG/ACT Aero Generic drug: mometasone-formoterol Inhale 2 puffs into the lungs 2 (two) times daily.   guaiFENesin 600 MG 12 hr tablet Commonly known as: MUCINEX Take 1 tablet (600 mg total) by mouth 2 (two) times daily. What changed:  when to take this reasons to take this   magnesium oxide 400 (240 Mg) MG tablet Commonly known as: MAG-OX Take 1 tablet (400 mg total) by mouth daily.   montelukast 10 MG tablet Commonly known as: SINGULAIR Take 1 tablet (10 mg total) by mouth daily. What changed: when to take this   pantoprazole 20 MG tablet Commonly known as: PROTONIX Take 1 tablet (20 mg total) by mouth daily.   polyethylene glycol 17 g packet Commonly known as: MIRALAX / GLYCOLAX Take 17 g by mouth daily. Start taking on: July 06, 2023   predniSONE 20 MG tablet Commonly known as: DELTASONE Take 2 tablets (40 mg total) by mouth daily with breakfast for 3 days. Start taking on: July 06, 2023   senna-docusate 8.6-50 MG tablet Commonly known as: Senokot-S Take 1 tablet by mouth 2 (two) times daily.   Simbrinza 1-0.2 % Susp Generic drug: Brinzolamide-Brimonidine Place 1 drop into the left eye in the morning and at bedtime.   tamsulosin 0.4 MG Caps capsule Commonly known as: FLOMAX Take 1 capsule (0.4 mg total) by mouth daily.   theophylline 400 MG 24 hr tablet Commonly  known as: UNIPHYL Take 1 tablet (400 mg total) by mouth at bedtime.        Day of Discharge BP (!) 147/89 (BP Location: Left Arm)   Pulse 83   Temp 98.2 F (36.8 C)   Resp 18   Ht 5\' 10"  (1.778 m)   Wt 77.1 kg   SpO2 96%   BMI 24.39 kg/m   Physical Exam: General: No acute respiratory distress Lungs: Clear to auscultation bilaterally without wheezes or crackles Cardiovascular: Regular rate and rhythm without murmur gallop or rub normal S1 and S2 Abdomen: Nontender, nondistended, soft, bowel sounds positive, no rebound, no ascites, no appreciable mass Extremities: No significant cyanosis, clubbing, or edema bilateral lower extremities  Basic Metabolic Panel: Recent Labs  Lab 07/04/23 0010  NA 139  K 4.1  CL 101  CO2 27  GLUCOSE 121*  BUN 16  CREATININE 0.86  CALCIUM 8.4*  MG 2.2    CBC: Recent Labs  Lab 07/04/23 0010  WBC 7.9  NEUTROABS 3.4  HGB 14.1  HCT 43.4  MCV 88.4  PLT 238    Time spent in discharge (includes decision making &  examination of pt): 30 minutes  07/05/2023, 10:35 AM   Lonia Blood, MD [facility] Office  [phone]

## 2023-10-14 ENCOUNTER — Other Ambulatory Visit: Payer: Self-pay | Admitting: Physician Assistant

## 2023-10-14 DIAGNOSIS — R911 Solitary pulmonary nodule: Secondary | ICD-10-CM

## 2023-10-23 ENCOUNTER — Ambulatory Visit
Admission: RE | Admit: 2023-10-23 | Discharge: 2023-10-23 | Disposition: A | Discharge status: Home / Self Care | Payer: Medicare Other | Source: Ambulatory Visit | Attending: Physician Assistant | Admitting: Physician Assistant

## 2023-10-23 DIAGNOSIS — R911 Solitary pulmonary nodule: Secondary | ICD-10-CM | POA: Diagnosis present

## 2023-11-12 ENCOUNTER — Emergency Department: Payer: Medicare Other

## 2023-11-12 ENCOUNTER — Telehealth: Payer: Self-pay | Admitting: Acute Care

## 2023-11-12 ENCOUNTER — Telehealth: Payer: Self-pay

## 2023-11-12 DIAGNOSIS — F419 Anxiety disorder, unspecified: Secondary | ICD-10-CM | POA: Diagnosis present

## 2023-11-12 DIAGNOSIS — J441 Chronic obstructive pulmonary disease with (acute) exacerbation: Secondary | ICD-10-CM | POA: Diagnosis present

## 2023-11-12 DIAGNOSIS — J9621 Acute and chronic respiratory failure with hypoxia: Secondary | ICD-10-CM | POA: Diagnosis not present

## 2023-11-12 DIAGNOSIS — Z825 Family history of asthma and other chronic lower respiratory diseases: Secondary | ICD-10-CM | POA: Diagnosis not present

## 2023-11-12 DIAGNOSIS — I10 Essential (primary) hypertension: Secondary | ICD-10-CM | POA: Diagnosis present

## 2023-11-12 DIAGNOSIS — Z87891 Personal history of nicotine dependence: Secondary | ICD-10-CM

## 2023-11-12 DIAGNOSIS — Z823 Family history of stroke: Secondary | ICD-10-CM | POA: Diagnosis not present

## 2023-11-12 DIAGNOSIS — Z79899 Other long term (current) drug therapy: Secondary | ICD-10-CM

## 2023-11-12 DIAGNOSIS — K219 Gastro-esophageal reflux disease without esophagitis: Secondary | ICD-10-CM | POA: Diagnosis not present

## 2023-11-12 DIAGNOSIS — Z7951 Long term (current) use of inhaled steroids: Secondary | ICD-10-CM

## 2023-11-12 DIAGNOSIS — Z833 Family history of diabetes mellitus: Secondary | ICD-10-CM | POA: Diagnosis not present

## 2023-11-12 DIAGNOSIS — F32A Depression, unspecified: Secondary | ICD-10-CM | POA: Diagnosis present

## 2023-11-12 DIAGNOSIS — Z8249 Family history of ischemic heart disease and other diseases of the circulatory system: Secondary | ICD-10-CM

## 2023-11-12 DIAGNOSIS — Z1152 Encounter for screening for COVID-19: Secondary | ICD-10-CM

## 2023-11-12 DIAGNOSIS — H548 Legal blindness, as defined in USA: Secondary | ICD-10-CM | POA: Diagnosis not present

## 2023-11-12 DIAGNOSIS — Z9981 Dependence on supplemental oxygen: Secondary | ICD-10-CM

## 2023-11-12 DIAGNOSIS — N400 Enlarged prostate without lower urinary tract symptoms: Secondary | ICD-10-CM | POA: Diagnosis present

## 2023-11-12 NOTE — Telephone Encounter (Signed)
 Error

## 2023-11-12 NOTE — Telephone Encounter (Signed)
 Please call patient and let him know that his scan looks like he still has an infectious process in his lungs.  Ask if he has been sick and if he has been treated for any upper respiratory illness since the scan was done on December 20.  Lets go ahead and place an order for a follow-up low-dose CT 2 months from December 20 so December 24, 2023.  If the patient has not been seen by his PCP and treated please refer him to his [facility] at the [facility] in [place] for treatment prior to his next CT of the chest.  We will need to make sure they do not duplicate the repeat CT order as we are going to follow that now through the screening program. He is a [facility] patient .  Thanks [employer]!!

## 2023-11-12 NOTE — Telephone Encounter (Signed)
 Called patient to review results. No answer and phone continues to ring.

## 2023-11-12 NOTE — ED Triage Notes (Addendum)
 Pt comes via [facility] for SOB, hx of COPD, went to PCP today and was unable to get meds cause pharmacy was closed, pt was originally 85% on RA, now on neb treatment, wears 3L all the time at home. PTA received 125 solumedrol, 2.5 albuterol  and 2.5 Atrovent . Audible expiratory wheezing.

## 2023-11-12 NOTE — Telephone Encounter (Signed)
 Called but no answer and no VM picked up after multiple rings. See notes below for information regarding call to patient.  Letter mailed to patient to call regarding results.  Will need to place order for follow up once we have contacted the patient.

## 2023-11-13 ENCOUNTER — Emergency Department: Payer: Medicare Other

## 2023-11-13 ENCOUNTER — Inpatient Hospital Stay
Admission: EM | Admit: 2023-11-13 | Discharge: 2023-11-20 | DRG: 190 | Disposition: A | Discharge status: Home / Self Care | Payer: Medicare Other | Attending: Internal Medicine | Admitting: Internal Medicine

## 2023-11-13 DIAGNOSIS — Z833 Family history of diabetes mellitus: Secondary | ICD-10-CM | POA: Diagnosis not present

## 2023-11-13 DIAGNOSIS — F32A Depression, unspecified: Secondary | ICD-10-CM

## 2023-11-13 DIAGNOSIS — Z87891 Personal history of nicotine dependence: Secondary | ICD-10-CM | POA: Diagnosis not present

## 2023-11-13 DIAGNOSIS — Z8249 Family history of ischemic heart disease and other diseases of the circulatory system: Secondary | ICD-10-CM | POA: Diagnosis not present

## 2023-11-13 DIAGNOSIS — I10 Essential (primary) hypertension: Secondary | ICD-10-CM

## 2023-11-13 DIAGNOSIS — Z1152 Encounter for screening for COVID-19: Secondary | ICD-10-CM | POA: Diagnosis not present

## 2023-11-13 DIAGNOSIS — J9621 Acute and chronic respiratory failure with hypoxia: Secondary | ICD-10-CM | POA: Diagnosis present

## 2023-11-13 DIAGNOSIS — Z823 Family history of stroke: Secondary | ICD-10-CM | POA: Diagnosis not present

## 2023-11-13 DIAGNOSIS — Z7951 Long term (current) use of inhaled steroids: Secondary | ICD-10-CM | POA: Diagnosis not present

## 2023-11-13 DIAGNOSIS — J441 Chronic obstructive pulmonary disease with (acute) exacerbation: Secondary | ICD-10-CM | POA: Diagnosis present

## 2023-11-13 DIAGNOSIS — K219 Gastro-esophageal reflux disease without esophagitis: Secondary | ICD-10-CM | POA: Diagnosis present

## 2023-11-13 DIAGNOSIS — Z9981 Dependence on supplemental oxygen: Secondary | ICD-10-CM | POA: Diagnosis not present

## 2023-11-13 DIAGNOSIS — N400 Enlarged prostate without lower urinary tract symptoms: Secondary | ICD-10-CM | POA: Diagnosis present

## 2023-11-13 DIAGNOSIS — Z825 Family history of asthma and other chronic lower respiratory diseases: Secondary | ICD-10-CM | POA: Diagnosis not present

## 2023-11-13 DIAGNOSIS — H548 Legal blindness, as defined in USA: Secondary | ICD-10-CM | POA: Diagnosis present

## 2023-11-13 DIAGNOSIS — F419 Anxiety disorder, unspecified: Secondary | ICD-10-CM | POA: Diagnosis present

## 2023-11-13 DIAGNOSIS — Z79899 Other long term (current) drug therapy: Secondary | ICD-10-CM | POA: Diagnosis not present

## 2023-11-13 DIAGNOSIS — J9611 Chronic respiratory failure with hypoxia: Secondary | ICD-10-CM | POA: Diagnosis present

## 2023-11-13 LAB — CBC
HCT: 40 % (ref 39.0–52.0)
HCT: 41.6 % (ref 39.0–52.0)
Hemoglobin: 12.9 g/dL — ABNORMAL LOW (ref 13.0–17.0)
Hemoglobin: 13.5 g/dL (ref 13.0–17.0)
MCH: 29.2 pg (ref 26.0–34.0)
MCH: 29.3 pg (ref 26.0–34.0)
MCHC: 32.3 g/dL (ref 30.0–36.0)
MCHC: 32.5 g/dL (ref 30.0–36.0)
MCV: 90.4 fL (ref 80.0–100.0)
MCV: 90.5 fL (ref 80.0–100.0)
Platelets: 211 10*3/uL (ref 150–400)
Platelets: 233 10*3/uL (ref 150–400)
RBC: 4.42 MIL/uL (ref 4.22–5.81)
RBC: 4.6 MIL/uL (ref 4.22–5.81)
RDW: 13.2 % (ref 11.5–15.5)
RDW: 13.2 % (ref 11.5–15.5)
WBC: 6.4 10*3/uL (ref 4.0–10.5)
WBC: 7.5 10*3/uL (ref 4.0–10.5)
nRBC: 0 % (ref 0.0–0.2)
nRBC: 0 % (ref 0.0–0.2)

## 2023-11-13 LAB — BASIC METABOLIC PANEL
Anion gap: 11 (ref 5–15)
Anion gap: 13 (ref 5–15)
BUN: 18 mg/dL (ref 8–23)
BUN: 19 mg/dL (ref 8–23)
CO2: 28 mmol/L (ref 22–32)
CO2: 29 mmol/L (ref 22–32)
Calcium: 8.7 mg/dL — ABNORMAL LOW (ref 8.9–10.3)
Calcium: 8.8 mg/dL — ABNORMAL LOW (ref 8.9–10.3)
Chloride: 96 mmol/L — ABNORMAL LOW (ref 98–111)
Chloride: 99 mmol/L (ref 98–111)
Creatinine, Ser: 0.82 mg/dL (ref 0.61–1.24)
Creatinine, Ser: 0.84 mg/dL (ref 0.61–1.24)
GFR, Estimated: >60 mL/min (ref >60)
GFR, Estimated: >60 mL/min (ref >60)
Glucose, Bld: 128 mg/dL — ABNORMAL HIGH (ref 70–99)
Glucose, Bld: 185 mg/dL — ABNORMAL HIGH (ref 70–99)
Potassium: 3.7 mmol/L (ref 3.5–5.1)
Potassium: 4.5 mmol/L (ref 3.5–5.1)
Sodium: 138 mmol/L (ref 135–145)
Sodium: 138 mmol/L (ref 135–145)

## 2023-11-13 LAB — RESP PANEL BY RT-PCR (RSV, FLU A&B, COVID)  RVPGX2
Influenza A by PCR: NEGATIVE
Influenza B by PCR: NEGATIVE
Resp Syncytial Virus by PCR: NEGATIVE
SARS Coronavirus 2 by RT PCR: NEGATIVE

## 2023-11-13 LAB — TROPONIN I (HIGH SENSITIVITY)
Troponin I (High Sensitivity): 7 ng/L (ref <18)
Troponin I (High Sensitivity): 8 ng/L (ref <18)

## 2023-11-13 LAB — BRAIN NATRIURETIC PEPTIDE: B Natriuretic Peptide: 13.9 pg/mL (ref 0.0–100.0)

## 2023-11-13 MED ORDER — ONDANSETRON HCL 4 MG/2ML IJ SOLN: 4.0000 mg | Freq: Four times a day (QID) | INTRAMUSCULAR | Status: DC | PRN

## 2023-11-13 MED ORDER — ALPRAZOLAM 0.25 MG PO TABS
0.2500 mg | ORAL_TABLET | Freq: Three times a day (TID) | ORAL | Status: DC | PRN
Start: 2023-11-13 — End: 2023-11-20
  Administered 2023-11-15 – 2023-11-18 (×3): 0.25 mg via ORAL
  Filled 2023-11-13 (×4): qty 1

## 2023-11-13 MED ORDER — MAGNESIUM HYDROXIDE 400 MG/5ML PO SUSP
30.0000 mL | Freq: Every day | ORAL | Status: DC | PRN
  Administered 2023-11-16 – 2023-11-18 (×2): 30 mL via ORAL
  Filled 2023-11-13 (×2): qty 30

## 2023-11-13 MED ORDER — SODIUM CHLORIDE 0.9 % IV SOLN
1.0000 g | INTRAVENOUS | Status: AC
  Administered 2023-11-14 – 2023-11-17 (×4): 1 g via INTRAVENOUS
  Filled 2023-11-13 (×4): qty 10

## 2023-11-13 MED ORDER — AMLODIPINE BESYLATE 10 MG PO TABS
10.0000 mg | ORAL_TABLET | Freq: Every day | ORAL | Status: DC
  Administered 2023-11-13 – 2023-11-20 (×8): 10 mg via ORAL
  Filled 2023-11-13 (×2): qty 1
  Filled 2023-11-13: qty 2
  Filled 2023-11-13: qty 1
  Filled 2023-11-13: qty 2
  Filled 2023-11-13: qty 1
  Filled 2023-11-13: qty 2
  Filled 2023-11-13: qty 1

## 2023-11-13 MED ORDER — ACETAMINOPHEN 325 MG PO TABS: 650.0000 mg | ORAL_TABLET | Freq: Four times a day (QID) | ORAL | Status: DC | PRN

## 2023-11-13 MED ORDER — ONDANSETRON HCL 4 MG PO TABS: 4.0000 mg | ORAL_TABLET | Freq: Four times a day (QID) | ORAL | Status: DC | PRN

## 2023-11-13 MED ORDER — THEOPHYLLINE ER 400 MG PO TB24
400.0000 mg | ORAL_TABLET | Freq: Every day | ORAL | Status: DC
  Administered 2023-11-13 – 2023-11-19 (×7): 400 mg via ORAL
  Filled 2023-11-13 (×10): qty 1

## 2023-11-13 MED ORDER — ENOXAPARIN SODIUM 40 MG/0.4ML IJ SOSY
40.0000 mg | PREFILLED_SYRINGE | INTRAMUSCULAR | Status: DC
  Administered 2023-11-13 – 2023-11-20 (×8): 40 mg via SUBCUTANEOUS
  Filled 2023-11-13 (×9): qty 0.4

## 2023-11-13 MED ORDER — POLYETHYLENE GLYCOL 3350 17 G PO PACK
17.0000 g | PACK | Freq: Every day | ORAL | Status: DC
Start: 2023-11-13 — End: 2023-11-20
  Administered 2023-11-13 – 2023-11-20 (×6): 17 g via ORAL
  Filled 2023-11-13 (×6): qty 1

## 2023-11-13 MED ORDER — PANTOPRAZOLE SODIUM 20 MG PO TBEC
20.0000 mg | DELAYED_RELEASE_TABLET | Freq: Every day | ORAL | Status: DC
  Administered 2023-11-13 – 2023-11-20 (×8): 20 mg via ORAL
  Filled 2023-11-13 (×8): qty 1

## 2023-11-13 MED ORDER — SODIUM CHLORIDE 0.9 % IV SOLN: INTRAVENOUS | Status: DC

## 2023-11-13 MED ORDER — MAGNESIUM OXIDE -MG SUPPLEMENT 400 (240 MG) MG PO TABS
400.0000 mg | ORAL_TABLET | Freq: Every day | ORAL | Status: DC
  Administered 2023-11-13 – 2023-11-20 (×8): 400 mg via ORAL
  Filled 2023-11-13 (×8): qty 1

## 2023-11-13 MED ORDER — METHYLPREDNISOLONE SODIUM SUCC 40 MG IJ SOLR
40.0000 mg | Freq: Two times a day (BID) | INTRAMUSCULAR | Status: AC
  Administered 2023-11-13 (×2): 40 mg via INTRAVENOUS
  Filled 2023-11-13 (×2): qty 1

## 2023-11-13 MED ORDER — BRINZOLAMIDE 1 % OP SUSP
1.0000 [drp] | Freq: Three times a day (TID) | OPHTHALMIC | Status: DC
  Administered 2023-11-13 – 2023-11-15 (×9): 1 [drp] via OPHTHALMIC
  Filled 2023-11-13: qty 10

## 2023-11-13 MED ORDER — BRIMONIDINE TARTRATE 0.2 % OP SOLN
1.0000 [drp] | Freq: Three times a day (TID) | OPHTHALMIC | Status: DC
  Administered 2023-11-13 – 2023-11-15 (×9): 1 [drp] via OPHTHALMIC
  Filled 2023-11-13: qty 5

## 2023-11-13 MED ORDER — MAGNESIUM SULFATE 2 GM/50ML IV SOLN
2.0000 g | Freq: Once | INTRAVENOUS | Status: AC
  Administered 2023-11-13: 2 g via INTRAVENOUS
  Filled 2023-11-13: qty 50

## 2023-11-13 MED ORDER — PREDNISONE 20 MG PO TABS
40.0000 mg | ORAL_TABLET | Freq: Every day | ORAL | Status: AC
  Administered 2023-11-14 – 2023-11-17 (×4): 40 mg via ORAL
  Filled 2023-11-13 (×4): qty 2

## 2023-11-13 MED ORDER — TAMSULOSIN HCL 0.4 MG PO CAPS
0.4000 mg | ORAL_CAPSULE | Freq: Every day | ORAL | Status: DC
  Administered 2023-11-13 – 2023-11-20 (×8): 0.4 mg via ORAL
  Filled 2023-11-13 (×8): qty 1

## 2023-11-13 MED ORDER — SODIUM CHLORIDE 0.9 % IV SOLN
1.0000 g | Freq: Once | INTRAVENOUS | Status: AC
  Administered 2023-11-13: 1 g via INTRAVENOUS
  Filled 2023-11-13: qty 10

## 2023-11-13 MED ORDER — SENNOSIDES-DOCUSATE SODIUM 8.6-50 MG PO TABS
1.0000 | ORAL_TABLET | Freq: Two times a day (BID) | ORAL | Status: DC
  Administered 2023-11-13 – 2023-11-18 (×9): 1 via ORAL
  Filled 2023-11-13 (×10): qty 1

## 2023-11-13 MED ORDER — GUAIFENESIN ER 600 MG PO TB12
600.0000 mg | ORAL_TABLET | Freq: Two times a day (BID) | ORAL | Status: DC
  Administered 2023-11-13 – 2023-11-18 (×5): 600 mg via ORAL
  Filled 2023-11-13 (×9): qty 1

## 2023-11-13 MED ORDER — SODIUM CHLORIDE 0.9 % IV SOLN
500.0000 mg | Freq: Once | INTRAVENOUS | Status: AC
  Administered 2023-11-13: 500 mg via INTRAVENOUS
  Filled 2023-11-13: qty 5

## 2023-11-13 MED ORDER — MONTELUKAST SODIUM 10 MG PO TABS
10.0000 mg | ORAL_TABLET | Freq: Every day | ORAL | Status: DC
  Administered 2023-11-13 – 2023-11-19 (×6): 10 mg via ORAL
  Filled 2023-11-13 (×6): qty 1

## 2023-11-13 MED ORDER — IPRATROPIUM-ALBUTEROL 0.5-2.5 (3) MG/3ML IN SOLN
9.0000 mL | Freq: Once | RESPIRATORY_TRACT | Status: AC
  Administered 2023-11-13: 9 mL via RESPIRATORY_TRACT
  Filled 2023-11-13: qty 9

## 2023-11-13 MED ORDER — TRAZODONE HCL 50 MG PO TABS
25.0000 mg | ORAL_TABLET | Freq: Every evening | ORAL | Status: DC | PRN
  Administered 2023-11-14 – 2023-11-18 (×4): 25 mg via ORAL
  Filled 2023-11-13 (×4): qty 1

## 2023-11-13 MED ORDER — IPRATROPIUM-ALBUTEROL 0.5-2.5 (3) MG/3ML IN SOLN: 3.0000 mL | RESPIRATORY_TRACT | Status: DC | PRN

## 2023-11-13 MED ORDER — IPRATROPIUM-ALBUTEROL 0.5-2.5 (3) MG/3ML IN SOLN
3.0000 mL | Freq: Four times a day (QID) | RESPIRATORY_TRACT | Status: DC
  Administered 2023-11-13 – 2023-11-15 (×10): 3 mL via RESPIRATORY_TRACT
  Filled 2023-11-13 (×10): qty 3

## 2023-11-13 MED ORDER — ACETAMINOPHEN 650 MG RE SUPP: 650.0000 mg | Freq: Four times a day (QID) | RECTAL | Status: DC | PRN

## 2023-11-13 MED ORDER — HYDROCOD POLI-CHLORPHE POLI ER 10-8 MG/5ML PO SUER
5.0000 mL | Freq: Two times a day (BID) | ORAL | Status: DC | PRN
  Administered 2023-11-16 – 2023-11-18 (×4): 5 mL via ORAL
  Filled 2023-11-13 (×4): qty 5

## 2023-11-13 NOTE — ED Provider Notes (Addendum)
 3  [facility] Provider Note    Event Date/Time   First MD Initiated Contact with Patient 11/13/23 [phone]     (approximate)   History   Shortness of Breath   HPI  Bryan Kelley is a 68 y.o. male past medical history significant for COPD on chronic 3 L of home oxygen  who presents to the emergency department with shortness of breath.  Progressively worsening shortness of breath for the past 2 days.  Called primary care physician who called in antibiotics but states that he has been unable to get the antibiotics filled yet.  Denies any significant chest pain.  No fever.  Denies any significant swelling to his legs.  No history of DVT or PE.  No recent travel or surgery.   Patient received IV Solu-Medrol  125 mg and 2 rounds of DuoNeb prior to arrival with EMS  Physical Exam   Triage Vital Signs: ED Triage Vitals  Encounter Vitals Group     BP 11/12/23 2349 (!) 163/90     Systolic BP Percentile --      Diastolic BP Percentile --      Pulse Rate 11/12/23 2349 (!) 106     Resp 11/12/23 2349 (!) 24     Temp 11/12/23 2349 98.7 F (37.1 C)     Temp Source 11/12/23 2349 Oral     SpO2 11/12/23 2349 96 %     Weight --      Height --      Head Circumference --      Peak Flow --      Pain Score 11/12/23 2350 0     Pain Loc --      Pain Education --      Exclude from Growth Chart --     Most recent vital signs: Vitals:   11/12/23 2349  BP: (!) 163/90  Pulse: (!) 106  Resp: (!) 24  Temp: 98.7 F (37.1 C)  SpO2: 96%    Physical Exam Constitutional:      Appearance: He is well-developed.  HENT:     Head: Atraumatic.  Eyes:     Conjunctiva/sclera: Conjunctivae normal.  Cardiovascular:     Rate and Rhythm: Regular rhythm. Tachycardia present.  Pulmonary:     Effort: Tachypnea and respiratory distress present.     Breath sounds: Wheezing present.     Comments: 3 L Musculoskeletal:     Cervical back: Normal range of motion.     Right lower leg: No  edema.     Left lower leg: No edema.  Skin:    General: Skin is warm.     Capillary Refill: Capillary refill takes less than 2 seconds.  Neurological:     General: No focal deficit present.     Mental Status: He is alert. Mental status is at baseline.     IMPRESSION / MDM / ASSESSMENT AND PLAN / ED COURSE  I reviewed the triage vital signs and the nursing notes.  Differential diagnosis including COPD exacerbation, pneumonia, COVID/viral illness, heart failure, ACS, anemia  EKG  I, Clotilda Punter, the attending physician, personally viewed and interpreted this ECG.  Sinus tachycardia with a heart rate in the 100s.  Narrow complex.  No significant ST elevation.  Nonspecific ST changes.  Sinus tachycardia while on cardiac telemetry.  RADIOLOGY I independently reviewed imaging, my interpretation of imaging: Chest x-ray with no focal findings consistent with pneumonia  LABS (all labs ordered are listed, but only abnormal  results are displayed) Labs interpreted as -    Labs Reviewed  BASIC METABOLIC PANEL - Abnormal; Notable for the following components:      Result Value   Chloride 96 (*)    Glucose, Bld 128 (*)    Calcium  8.8 (*)    All other components within normal limits  RESP PANEL BY RT-PCR (RSV, FLU A&B, COVID)  RVPGX2  CBC  BRAIN NATRIURETIC PEPTIDE  TROPONIN I (HIGH SENSITIVITY)  TROPONIN I (HIGH SENSITIVITY)     MDM  On arrival patient with ongoing significant wheezing and signs of respiratory distress.  Given another DuoNeb treatment and IV magnesium .  COVID and influenza testing negative.  Troponin negative.  Normal BNP and chest x-ray with no signs of pulmonary edema.  No active chest pain at this time.  Given his productive cough with sputum production given antibiotics to cover for COPD exacerbation  On reevaluation continues to have significant shortness of breath and wheezing with signs of respiratory distress.  Consulted hospitalist for admission for  COPD exacerbation.  On chart review patient's last COPD exacerbation was in September of this year.     PROCEDURES:  Critical Care performed: No  Procedures  Patient's presentation is most consistent with acute presentation with potential threat to life or bodily function.   MEDICATIONS ORDERED IN ED: Medications  cefTRIAXone  (ROCEPHIN ) 1 g in sodium chloride  0.9 % 100 mL IVPB (has no administration in time range)  azithromycin  (ZITHROMAX ) 500 mg in sodium chloride  0.9 % 250 mL IVPB (500 mg Intravenous New Bag/Given 11/13/23 0306)  ipratropium-albuterol  (DUONEB) 0.5-2.5 (3) MG/3ML nebulizer solution 9 mL (9 mLs Nebulization Given 11/13/23 0111)  magnesium  sulfate IVPB 2 g 50 mL (0 g Intravenous Stopped 11/13/23 0257)    FINAL CLINICAL IMPRESSION(S) / ED DIAGNOSES   Final diagnoses:  COPD exacerbation (HCC)     Rx / DC Orders   ED Discharge Orders     None        Note:  This document was prepared using Dragon voice recognition software and may include unintentional dictation errors.   Suzanne Kirsch, MD 11/13/23 9750    Suzanne Kirsch, MD 11/13/23 [phone]

## 2023-11-13 NOTE — Assessment & Plan Note (Signed)
 -  We will continue Flomax

## 2023-11-13 NOTE — Assessment & Plan Note (Signed)
-   We will continue Xanax and citalopram.

## 2023-11-13 NOTE — Hospital Course (Signed)
 Bryan Kelley is a 68 y.o. African-American male with medical history significant for COPD, chronic respiratory failure on home O2 at 3 L/min, asthma, depression, and GERD, who presented to the emergency room with acute onset of worsening dyspnea with associated cough productive of yellowish sputum and wheezing over the last couple of days.  Patient has a normal white cell count, negative COVID and influenza.  Chest x-ray did not show any acute changes.  Patient was diagnosed with COPD exacerbation, was treated with steroids and antibiotics.

## 2023-11-13 NOTE — Telephone Encounter (Addendum)
 Attempted to reach patient again, no answer.

## 2023-11-13 NOTE — Assessment & Plan Note (Addendum)
-   This is clearly secondary to #1. - Management as above. - O2 protocol will be followed.

## 2023-11-13 NOTE — Assessment & Plan Note (Addendum)
-   This is associated with asthma exacerbation. - The patient will be admitted to a medically monitored bed. - We will place the patient IV steroid therapy with IV Solu-Medrol  as well as nebulized bronchodilator therapy with duonebs q.i.d. and q.4 hours p.r.n.[name] - Mucolytic therapy will be provided with Mucinex  and antibiotic therapy with IV Rocephin . - O2 protocol will be followed. - We will hold off long-acting beta agonist. - We will continue theophylline . - Will continue Singulair .

## 2023-11-13 NOTE — Assessment & Plan Note (Signed)
 -  We will continue PPI therapy

## 2023-11-13 NOTE — Assessment & Plan Note (Signed)
-   We will continue his 

## 2023-11-13 NOTE — Assessment & Plan Note (Signed)
-   We will continue antihypertensive therapy.

## 2023-11-13 NOTE — Progress Notes (Signed)
  Progress Note   Patient: Bryan Kelley FMW:[id number] DOB: 01/27/1956 DOA: 11/13/2023     0 DOS: the patient was seen and examined on 11/13/2023   Brief hospital course: Bryan Kelley is a 68 y.o. African-American male with medical history significant for COPD, chronic respiratory failure on home O2 at 3 L/min, asthma, depression, and GERD, who presented to the emergency room with acute onset of worsening dyspnea with associated cough productive of yellowish sputum and wheezing over the last couple of days.  Patient has a normal white cell count, negative COVID and influenza.  Chest x-ray did not show any acute changes.  Patient was diagnosed with COPD exacerbation, was treated with steroids and antibiotics.   Principal Problem:   COPD exacerbation (HCC) Active Problems:   Acute on chronic respiratory failure with hypoxia (HCC)   Essential hypertension   Anxiety and depression   BPH (benign prostatic hyperplasia)   GERD without esophagitis   Assessment and Plan: * COPD exacerbation (HCC) Chronic hypoxemic respiratory failure. Acute on chronic respite failure ruled out. Patient had a worsening short of breath, reviewed chart, patient was on 3 L oxygen  at home, no documented hypoxemia at the time of admission.  Patient does not have acute respite failure. Patient currently receiving Rocephin , will continue, also started on steroids. Scheduled bronchodilator.  Essential hypertension - We will continue antihypertensive therapy.  Anxiety and depression - We will continue Xanax  and citalopram .  GERD without esophagitis - We will continue PPI therapy.  BPH (benign prostatic hyperplasia) - We will continue Flomax .       Subjective:  Has short of breath with nursing.  Cough, nonproductive.  Physical Exam: Vitals:   11/13/23 0600 11/13/23 0737 11/13/23 1043 11/13/23 1044  BP: (!) 144/78 (!) 151/85  (!) 168/94  Pulse: 86 82  79  Resp: 20   (!) 22  Temp: 98.3 F (36.8 C)   97.9 F (36.6 C)   TempSrc:   Axillary   SpO2: 95%   98%   General exam: Appears calm and comfortable  Respiratory system: Decreased breathing sounds. Respiratory effort normal. Cardiovascular system: S1 & S2 heard, RRR. No JVD, murmurs, rubs, gallops or clicks. No pedal edema. Gastrointestinal system: Abdomen is nondistended, soft and nontender. No organomegaly or masses felt. Normal bowel sounds heard. Central nervous system: Alert and oriented. No focal neurological deficits. Extremities: Symmetric 5 x 5 power. Skin: No rashes, lesions or ulcers Psychiatry: Judgement and insight appear normal. Mood & affect appropriate.    Data Reviewed:  Chest x-ray and lab results reviewed.  Family Communication: None  Disposition: Status is: Inpatient Remains inpatient appropriate because: Burden of disease, IV treatment.     Time spent: No charge minutes  Author: Murvin Mana, MD 11/13/2023 12:35 PM  For on call review [email or web address].

## 2023-11-13 NOTE — ED Notes (Signed)
 Pt gave verbal consent to provide information about his care to his sister, Okey Dupre. This RN spoke with pt's sister on the phone and provided an update on his care.

## 2023-11-13 NOTE — H&P (Signed)
 [place]   PATIENT NAME: Bryan Kelley    MR#:  [id number]  DATE OF BIRTH:  10/05/1956  DATE OF ADMISSION:  11/13/2023  PRIMARY CARE PHYSICIAN: [facility]   Patient is coming from: Home  REQUESTING/REFERRING PHYSICIAN: Suzanne Kirsch, MD  CHIEF COMPLAINT:   Chief Complaint  Patient presents with   Shortness of Breath    HISTORY OF PRESENT ILLNESS:  Bryan Kelley is a 68 y.o. African-American male with medical history significant for COPD, chronic respiratory failure on home O2 at 3 L/min, asthma, depression, and GERD, who presented to the emergency room with acute onset of worsening dyspnea with associated cough productive of yellowish sputum and wheezing over the last couple of days.  He called his PCP who called and antibiotic therapy for him but he has not been able to fill it yet.  No chest pain or palpitations.  No fever or chills.  He denies any lower extremity edema or pain.  No headache or dizziness or blurred vision.  No bleeding diathesis.  No dysuria, oliguria or hematuria or flank pain.  ED Course: Upon presentation to the emergency room, BP was 163/90 with heart rate 106 and respiratory to 24 Pulsoxymeter was 96% on facemask and later on 3 L of O2 by nasal cannula.  Labs revealed chloride of 96 calcium  8.8 with otherwise unremarkable BMP.  High sensitive troponin I was 8 and later 7.  CBC with within normal.  Respiratory panel came back negative.  EKG as reviewed by me : EKG showed sinus tachycardia with rate of 108 with biatrial enlargement. Imaging: Two-view chest x-ray showed no acute cardiopulmonary disease.  The patient was given DuoNebs, IV Solu-Medrol , IV magnesium  sulfate, IV Rocephin  and Zithromax .  He will be admitted to a medical telemetry bed for further evaluation and management. PAST MEDICAL HISTORY:   Past Medical History:  Diagnosis Date   Asthma    COPD (chronic obstructive pulmonary disease) (HCC)    Depression    GERD  (gastroesophageal reflux disease)    Vision loss, left eye     PAST SURGICAL HISTORY:   Past Surgical History:  Procedure Laterality Date   EYE SURGERY  2009, about 8011,8010   steel in left eye on the job, legally blind     SOCIAL HISTORY:   Social History   Tobacco Use   Smoking status: Former    Current packs/day: 0.00    Average packs/day: 0.5 packs/day for 49.0 years (24.5 ttl pk-yrs)    Types: Cigarettes    Start date: 03/1973    Quit date: 03/2022    Years since quitting: 1.6   Smokeless tobacco: Never  Substance Use Topics   Alcohol  use: Yes    Alcohol /week: 2.0 standard drinks of alcohol     Types: 2 Cans of beer per week    FAMILY HISTORY:   Family History  Problem Relation Age of Onset   Diabetes Mother    Hypertension Mother    Stroke Mother    Asthma Father    Diabetes Father    Diabetes Sister    Hypertension Sister    Diabetes Brother    Hypertension Brother     DRUG ALLERGIES:  No Known Allergies  REVIEW OF SYSTEMS:   ROS As per history of present illness. All pertinent systems were reviewed above. Constitutional, HEENT, cardiovascular, respiratory, GI, GU, musculoskeletal, neuro, psychiatric, endocrine, integumentary and hematologic systems were reviewed and are otherwise negative/unremarkable except for  positive findings mentioned above in the HPI.   MEDICATIONS AT HOME:   Prior to Admission medications   Medication Sig Start Date End Date Taking? Authorizing Provider  acetaminophen  (TYLENOL ) 325 MG tablet Take 2 tablets (650 mg total) by mouth every 6 (six) hours as needed for mild pain, fever or headache. 07/05/23   Danton Reyes DASEN, MD  albuterol  (PROVENTIL ) (2.5 MG/3ML) 0.083% nebulizer solution Take 2.5 mg by nebulization every 4 (four) hours as needed for wheezing. 06/06/20   [provider]  albuterol  (VENTOLIN  HFA) 108 (90 Base) MCG/ACT inhaler Inhale 2 puffs into the lungs every 6 (six) hours as needed for wheezing or  shortness of breath. 11/30/19   Edelmiro, [place], MD  ALPRAZolam  (XANAX ) 0.25 MG tablet Take 1 tablet (0.25 mg total) by mouth 3 (three) times daily as needed for anxiety. 07/05/23   Danton Reyes DASEN, MD  amLODipine  (NORVASC ) 10 MG tablet Take 10 mg by mouth daily.    [provider]  DULERA  200-5 MCG/ACT AERO Inhale 2 puffs into the lungs 2 (two) times daily. 02/22/21   Maree Hue, MD  guaiFENesin  (MUCINEX ) 600 MG 12 hr tablet Take 1 tablet (600 mg total) by mouth 2 (two) times daily. Patient taking differently: Take 600 mg by mouth 2 (two) times daily as needed for cough or to loosen phlegm. 05/13/21   Josette Ade, MD  magnesium  oxide (MAG-OX) 400 (240 Mg) MG tablet Take 1 tablet (400 mg total) by mouth daily. 05/13/21   Josette Ade, MD  montelukast  (SINGULAIR ) 10 MG tablet Take 1 tablet (10 mg total) by mouth daily. Patient taking differently: Take 10 mg by mouth at bedtime. 02/22/21 07/04/23  Maree Hue, MD  pantoprazole  (PROTONIX ) 20 MG tablet Take 1 tablet (20 mg total) by mouth daily. 11/24/20 06/29/21  Amery, Sahar, MD  polyethylene glycol (MIRALAX  / GLYCOLAX ) 17 g packet Take 17 g by mouth daily. 07/06/23   Danton Reyes DASEN, MD  senna-docusate (SENOKOT-S) 8.6-50 MG tablet Take 1 tablet by mouth 2 (two) times daily. 07/05/23   Danton Reyes DASEN, MD  SIMBRINZA  1-0.2 % SUSP Place 1 drop into the left eye in the morning and at bedtime. 07/05/23   Danton Reyes DASEN, MD  tamsulosin  (FLOMAX ) 0.4 MG CAPS capsule Take 1 capsule (0.4 mg total) by mouth daily. 07/05/23   Danton Reyes DASEN, MD  theophylline  (UNIPHYL) 400 MG 24 hr tablet Take 1 tablet (400 mg total) by mouth at bedtime. 05/13/21   Josette Ade, MD      VITAL SIGNS:  Blood pressure (!) 155/87, pulse 92, temperature 98.4 F (36.9 C), resp. rate (!) 24, SpO2 96%.  PHYSICAL EXAMINATION:  Physical Exam  GENERAL:  68 y.o.-year-old male patient lying in the bed with mild respiratory distress with conversational  dyspnea. EYES: Pupils equal, round, reactive to light and accommodation. No scleral icterus. Extraocular muscles intact.  HEENT: Head atraumatic, normocephalic. Oropharynx and nasopharynx clear.  NECK:  Supple, no jugular venous distention. No thyroid  enlargement, no tenderness.  LUNGS: Diffuse expiratory wheezes with tight expiratory airflow and harsh vesicular breathing.  No use of accessory muscles of respiration.  CARDIOVASCULAR: Regular rate and rhythm, S1, S2 normal. No murmurs, rubs, or gallops.  ABDOMEN: Soft, nondistended, nontender. Bowel sounds present. No organomegaly or mass.  EXTREMITIES: No pedal edema, cyanosis, or clubbing.  NEUROLOGIC: Cranial nerves II through XII are intact. Muscle strength 5/5 in all extremities. Sensation intact. Gait not checked.  PSYCHIATRIC: The patient is alert and oriented x  3.  Normal affect and good eye contact. SKIN: No obvious rash, lesion, or ulcer.   LABORATORY PANEL:   CBC Recent Labs  Lab 11/13/23 0429  WBC 6.4  HGB 12.9*  HCT 40.0  PLT 211   ------------------------------------------------------------------------------------------------------------------  Chemistries  Recent Labs  Lab 11/13/23 0429  NA 138  K 4.5  CL 99  CO2 28  GLUCOSE 185*  BUN 18  CREATININE 0.82  CALCIUM  8.7*   ------------------------------------------------------------------------------------------------------------------  Cardiac Enzymes No results for input(s): TROPONINI in the last 168 hours. ------------------------------------------------------------------------------------------------------------------  RADIOLOGY:  DG Chest 2 View Result Date: 11/13/2023 CLINICAL DATA:  Shortness of breath EXAM: CHEST - 2 VIEW COMPARISON:  10/23/2023 FINDINGS: The heart size and mediastinal contours are within normal limits. Both lungs are clear. The visualized skeletal structures are unremarkable. Metallic foreign body is noted the right anterior chest  wall stable from prior CT. IMPRESSION: No active cardiopulmonary disease. Electronically Signed   By: Oneil Devonshire M.D.   On: 11/13/2023 00:29      IMPRESSION AND PLAN:  Assessment and Plan: * COPD exacerbation (HCC) - This is associated with asthma exacerbation. - The patient will be admitted to a medically monitored bed. - We will place the patient IV steroid therapy with IV Solu-Medrol  as well as nebulized bronchodilator therapy with duonebs q.i.d. and q.4 hours p.r.n.[name] - Mucolytic therapy will be provided with Mucinex  and antibiotic therapy with IV Rocephin . - O2 protocol will be followed. - We will hold off long-acting beta agonist. - We will continue theophylline . - Will continue Singulair .   Acute on chronic respiratory failure with hypoxia (HCC) - This is clearly secondary to #1. - Management as above. - O2 protocol will be followed.  Essential hypertension - We will continue antihypertensive therapy.  Anxiety and depression - We will continue Xanax  and citalopram .  GERD without esophagitis - We will continue PPI therapy.  BPH (benign prostatic hyperplasia) - We will continue Flomax .   DVT prophylaxis: Lovenox .  Advanced Care Planning:  Code Status: full code.  Family Communication:  The plan of care was discussed in details with the patient (and family). I answered all questions. The patient agreed to proceed with the above mentioned plan. Further management will depend upon hospital course. Disposition Plan: Back to previous home environment Consults called: none.  All the records are reviewed and case discussed with ED provider.  Status is: Inpatient    At the time of the admission, it appears that the appropriate admission status for this patient is inpatient.  This is judged to be reasonable and necessary in order to provide the required intensity of service to ensure the patient's safety given the presenting symptoms, physical exam findings and initial  radiographic and laboratory data in the context of comorbid conditions.  The patient requires inpatient status due to high intensity of service, high risk of further deterioration and high frequency of surveillance required.  I certify that at the time of admission, it is my clinical judgment that the patient will require inpatient hospital care extending more than 2 midnights.                            Dispo: The patient is from: Home              Anticipated d/c is to: Home              Patient currently is not medically stable to d/c.  Difficult to place patient: No  [place]ison DELENA Peaches M.D on 11/13/2023 at 5:35 AM  [facility]   From 7 PM-7 AM, contact night-coverage [email or web address]  CC: Primary care physician; [facility], [employer]

## 2023-11-14 DIAGNOSIS — J9621 Acute and chronic respiratory failure with hypoxia: Secondary | ICD-10-CM | POA: Diagnosis not present

## 2023-11-14 DIAGNOSIS — J441 Chronic obstructive pulmonary disease with (acute) exacerbation: Secondary | ICD-10-CM | POA: Diagnosis not present

## 2023-11-14 DIAGNOSIS — I10 Essential (primary) hypertension: Secondary | ICD-10-CM | POA: Diagnosis not present

## 2023-11-14 NOTE — Progress Notes (Signed)
  Progress Note   Patient: Bryan Kelley FMW:[id number] DOB: 17-Jan-1956 DOA: 11/13/2023     1 DOS: the patient was seen and examined on 11/14/2023   Brief hospital course: Bryan Kelley is a 68 y.o. African-American male with medical history significant for COPD, chronic respiratory failure on home O2 at 3 L/min, asthma, depression, and GERD, who presented to the emergency room with acute onset of worsening dyspnea with associated cough productive of yellowish sputum and wheezing over the last couple of days.  Patient has a normal white cell count, negative COVID and influenza.  Chest x-ray did not show any acute changes.  Patient was diagnosed with COPD exacerbation, was treated with steroids and antibiotics.   Principal Problem:   COPD exacerbation (HCC) Active Problems:   Acute on chronic respiratory failure with hypoxia (HCC)   Essential hypertension   Anxiety and depression   BPH (benign prostatic hyperplasia)   GERD without esophagitis   Assessment and Plan: * COPD exacerbation (HCC) Chronic hypoxemic respiratory failure. Acute on chronic respite failure ruled out. Patient had a worsening short of breath, reviewed chart, patient was on 3 L oxygen  at home, no documented hypoxemia at the time of admission.  Patient does not have acute respite failure. Patient currently receiving Rocephin , will continue, also started on steroids. Scheduled bronchodilator. Patient feels better today, but is still not ready for discharge.  Will continue current treatment.  Anticipating discharge tomorrow.   Essential hypertension - We will continue antihypertensive therapy.   Anxiety and depression - We will continue Xanax  and citalopram .   GERD without esophagitis - We will continue PPI therapy.   BPH (benign prostatic hyperplasia) - We will continue Flomax .          Subjective:  Patient still has significant short of breath with minimal exertion.  Cough, mom productive of green  mucus.  Physical Exam: Vitals:   11/14/23 0100 11/14/23 0356 11/14/23 0400 11/14/23 0500  BP: (!) 149/84     Pulse: 78 81 81 83  Resp: 20     Temp: 98.3 F (36.8 C)     TempSrc: Oral     SpO2: 100% 100% 100% 99%   General exam: Appears calm and comfortable  Respiratory system: Decreased breathing sounds with wheezes. Respiratory effort normal. Cardiovascular system: S1 & S2 heard, RRR. No JVD, murmurs, rubs, gallops or clicks. No pedal edema. Gastrointestinal system: Abdomen is nondistended, soft and nontender. No organomegaly or masses felt. Normal bowel sounds heard. Central nervous system: Alert and oriented. No focal neurological deficits. Extremities: Symmetric 5 x 5 power. Skin: No rashes, lesions or ulcers Psychiatry: Judgement and insight appear normal. Mood & affect appropriate.    Data Reviewed:  Lab results reviewed.  Family Communication: None  Disposition: Status is: Inpatient Remains inpatient appropriate because: Severity of disease.     Time spent: 35 minutes  Author: Murvin Mana, MD 11/14/2023 10:16 AM  For on call review [email or web address].

## 2023-11-15 ENCOUNTER — Other Ambulatory Visit: Payer: Self-pay

## 2023-11-15 ENCOUNTER — Encounter: Payer: Self-pay | Admitting: Family Medicine

## 2023-11-15 DIAGNOSIS — J441 Chronic obstructive pulmonary disease with (acute) exacerbation: Secondary | ICD-10-CM | POA: Diagnosis not present

## 2023-11-15 DIAGNOSIS — I10 Essential (primary) hypertension: Secondary | ICD-10-CM | POA: Diagnosis not present

## 2023-11-15 DIAGNOSIS — J9621 Acute and chronic respiratory failure with hypoxia: Secondary | ICD-10-CM | POA: Diagnosis not present

## 2023-11-15 MED ORDER — BRINZOLAMIDE 1 % OP SUSP
1.0000 [drp] | Freq: Three times a day (TID) | OPHTHALMIC | Status: DC
  Administered 2023-11-16 – 2023-11-20 (×13): 1 [drp] via OPHTHALMIC
  Filled 2023-11-15: qty 10

## 2023-11-15 MED ORDER — BRIMONIDINE TARTRATE 0.2 % OP SOLN
1.0000 [drp] | Freq: Three times a day (TID) | OPHTHALMIC | Status: DC
  Administered 2023-11-16 – 2023-11-20 (×13): 1 [drp] via OPHTHALMIC
  Filled 2023-11-15: qty 5

## 2023-11-15 MED ORDER — SODIUM CHLORIDE 0.9 % IV SOLN: INTRAVENOUS | Status: AC | PRN

## 2023-11-15 MED ORDER — IPRATROPIUM-ALBUTEROL 0.5-2.5 (3) MG/3ML IN SOLN
3.0000 mL | Freq: Four times a day (QID) | RESPIRATORY_TRACT | Status: DC
  Administered 2023-11-15 – 2023-11-16 (×3): 3 mL via RESPIRATORY_TRACT
  Filled 2023-11-15 (×3): qty 3

## 2023-11-15 NOTE — Progress Notes (Signed)
  Progress Note   Patient: Bryan Kelley FMW:[id number] DOB: 02-12-1956 DOA: 11/13/2023     2 DOS: the patient was seen and examined on 11/15/2023   Brief hospital course: [name] is a 68 y.o. African-American male with medical history significant for COPD, chronic respiratory failure on home O2 at 3 L/min, asthma, depression, and GERD, who presented to the emergency room with acute onset of worsening dyspnea with associated cough productive of yellowish sputum and wheezing over the last couple of days.  Patient has a normal white cell count, negative COVID and influenza.  Chest x-ray did not show any acute changes.  Patient was diagnosed with COPD exacerbation, was treated with steroids and antibiotics.   Principal Problem:   COPD exacerbation (HCC) Active Problems:   Acute on chronic respiratory failure with hypoxia (HCC)   Essential hypertension   Anxiety and depression   BPH (benign prostatic hyperplasia)   GERD without esophagitis   Assessment and Plan: * COPD exacerbation (HCC) Chronic hypoxemic respiratory failure. Acute on chronic respite failure ruled out. Patient had a worsening short of breath, reviewed chart, patient was on 3 L oxygen  at home, no documented hypoxemia at the time of admission.  Patient does not have acute respite failure. Patient currently receiving Rocephin , will continue, also started on steroids. Scheduled bronchodilator. Patient continue making progress, still feel not ready for discharge.  Continue current treatment.   Essential hypertension - We will continue antihypertensive therapy.   Anxiety and depression - We will continue Xanax  and citalopram .   GERD without esophagitis - We will continue PPI therapy.   BPH (benign prostatic hyperplasia) - We will continue Flomax .      Subjective:  Still short of breath with exertion, cough, more productive.  Physical Exam: Vitals:   11/15/23 0819 11/15/23 1025 11/15/23 1100 11/15/23 1208   BP: 135/80 127/76 131/77   Pulse: 98  78   Resp: 18  18   Temp: 98.6 F (37 C)   98 F (36.7 C)  TempSrc: Oral   Oral  SpO2: 98%  99%    General exam: Appears calm and comfortable  Respiratory system: Decreased breath sounds. Respiratory effort normal. Cardiovascular system: S1 & S2 heard, RRR. No JVD, murmurs, rubs, gallops or clicks. No pedal edema. Gastrointestinal system: Abdomen is nondistended, soft and nontender. No organomegaly or masses felt. Normal bowel sounds heard. Central nervous system: Alert and oriented. No focal neurological deficits. Extremities: Symmetric 5 x 5 power. Skin: No rashes, lesions or ulcers Psychiatry: Judgement and insight appear normal. Mood & affect appropriate.    Data Reviewed:  Lab results reviewed.  Family Communication: None  Disposition: Status is: Inpatient Remains inpatient appropriate because: Severity of disease.     Time spent: 35 minutes  Author: Murvin Mana, MD 11/15/2023 12:27 PM  For on call review [email or web address]

## 2023-11-15 NOTE — ED Notes (Signed)
 Patient given incentive spirometer and education provided.

## 2023-11-16 DIAGNOSIS — I10 Essential (primary) hypertension: Secondary | ICD-10-CM | POA: Diagnosis not present

## 2023-11-16 DIAGNOSIS — J9621 Acute and chronic respiratory failure with hypoxia: Secondary | ICD-10-CM | POA: Diagnosis not present

## 2023-11-16 DIAGNOSIS — J441 Chronic obstructive pulmonary disease with (acute) exacerbation: Secondary | ICD-10-CM | POA: Diagnosis not present

## 2023-11-16 MED ORDER — LACTULOSE 10 GM/15ML PO SOLN
20.0000 g | Freq: Once | ORAL | Status: AC
  Administered 2023-11-16: 20 g via ORAL
  Filled 2023-11-16: qty 30

## 2023-11-16 MED ORDER — IPRATROPIUM-ALBUTEROL 0.5-2.5 (3) MG/3ML IN SOLN
3.0000 mL | Freq: Two times a day (BID) | RESPIRATORY_TRACT | Status: DC
  Administered 2023-11-16 – 2023-11-17 (×3): 3 mL via RESPIRATORY_TRACT
  Filled 2023-11-16 (×3): qty 3

## 2023-11-16 NOTE — Telephone Encounter (Signed)
 Called patient. No answer or VM setup. Will try again later.

## 2023-11-16 NOTE — Care Management Important Message (Signed)
 Important Message  Patient Details  Name: Bryan Kelley MRN: [id number] Date of Birth: [birth year]   Important Message Given:  Yes - Medicare IM     Sherilyn Banker 11/16/2023, 4:09 PM

## 2023-11-16 NOTE — Plan of Care (Signed)
  Problem: Clinical Measurements: Goal: Respiratory complications will improve Outcome: Progressing   Problem: Activity: Goal: Risk for activity intolerance will decrease Outcome: Progressing   Problem: Nutrition: Goal: Adequate nutrition will be maintained Outcome: Progressing   Problem: Elimination: Goal: Will not experience complications related to bowel motility Outcome: Progressing   Problem: Pain Management: Goal: General experience of comfort will improve Outcome: Progressing

## 2023-11-16 NOTE — Progress Notes (Signed)
  Progress Note   Patient: Bryan Kelley FMW:[id number] DOB: [birth year] DOA: 11/13/2023     3 DOS: the patient was seen and examined on 11/16/2023   Brief hospital course: Bryan Kelley is a 68 y.o. African-American male with medical history significant for COPD, chronic respiratory failure on home O2 at 3 L/min, asthma, depression, and GERD, who presented to the emergency room with acute onset of worsening dyspnea with associated cough productive of yellowish sputum and wheezing over the last couple of days.  Patient has a normal white cell count, negative COVID and influenza.  Chest x-ray did not show any acute changes.  Patient was diagnosed with COPD exacerbation, was treated with steroids and antibiotics.   Principal Problem:   COPD exacerbation (HCC) Active Problems:   Acute on chronic respiratory failure with hypoxia (HCC)   Essential hypertension   Anxiety and depression   BPH (benign prostatic hyperplasia)   GERD without esophagitis   Assessment and Plan: * COPD exacerbation (HCC) Chronic hypoxemic respiratory failure. Acute on chronic respite failure ruled out. Patient had a worsening short of breath, reviewed chart, patient was on 3 L oxygen  at home, no documented hypoxemia at the time of admission.  Patient does not have acute respite failure. Patient currently receiving Rocephin , will continue, also started on steroids. Scheduled bronchodilator. Patient doing better, still does not feel ready for discharge.  Continue current treatment for another day.   Essential hypertension - We will continue antihypertensive therapy.   Anxiety and depression - We will continue Xanax  and citalopram .   GERD without esophagitis - We will continue PPI therapy.   BPH (benign prostatic hyperplasia) - We will continue Flomax .      Subjective:  Patient still feels significant short of breath with minimal exertion, wheezing better.  Physical Exam: Vitals:   11/15/23 2054 11/16/23  0124 11/16/23 0154 11/16/23 0846  BP:   132/80 135/78  Pulse:   81 71  Resp:   18 18  Temp:   98 F (36.7 C) 98.2 F (36.8 C)  TempSrc:   Oral Oral  SpO2: 100% 100% 99% 100%   General exam: Appears calm and comfortable  Respiratory system: Decreased breath sounds without wheezes. Respiratory effort normal. Cardiovascular system: S1 & S2 heard, RRR. No JVD, murmurs, rubs, gallops or clicks. No pedal edema. Gastrointestinal system: Abdomen is nondistended, soft and nontender. No organomegaly or masses felt. Normal bowel sounds heard. Central nervous system: Alert and oriented. No focal neurological deficits. Extremities: Symmetric 5 x 5 power. Skin: No rashes, lesions or ulcers Psychiatry: Judgement and insight appear normal. Mood & affect appropriate.    Data Reviewed:  Results reviewed.  Family Communication: None  Disposition: Status is: Inpatient Remains inpatient appropriate because: Severity of disease.     Time spent: 35 minutes  Author: Murvin Mana, MD 11/16/2023 12:39 PM  For on call review [email or web address].

## 2023-11-17 DIAGNOSIS — I10 Essential (primary) hypertension: Secondary | ICD-10-CM | POA: Diagnosis not present

## 2023-11-17 DIAGNOSIS — J441 Chronic obstructive pulmonary disease with (acute) exacerbation: Secondary | ICD-10-CM | POA: Diagnosis not present

## 2023-11-17 MED ORDER — PREDNISONE 20 MG PO TABS
20.0000 mg | ORAL_TABLET | Freq: Every day | ORAL | Status: DC
  Administered 2023-11-18 – 2023-11-20 (×3): 20 mg via ORAL
  Filled 2023-11-17 (×3): qty 1

## 2023-11-17 NOTE — Progress Notes (Signed)
  Progress Note   Patient: Bryan Kelley [id number] DOB: 02-Aug-1956 DOA: 11/13/2023     4 DOS: the patient was seen and examined on 11/17/2023   Brief hospital course: Bryan Kelley is a 68 y.o. African-American male with medical history significant for COPD, chronic respiratory failure on home O2 at 3 L/min, asthma, depression, and GERD, who presented to the emergency room with acute onset of worsening dyspnea with associated cough productive of yellowish sputum and wheezing over the last couple of days.  Patient has a normal white cell count, negative COVID and influenza.  Chest x-ray did not show any acute changes.  Patient was diagnosed with COPD exacerbation, was treated with steroids and antibiotics.   Principal Problem:   COPD exacerbation (HCC) Active Problems:   Acute on chronic respiratory failure with hypoxia (HCC)   Essential hypertension   Anxiety and depression   BPH (benign prostatic hyperplasia)   GERD without esophagitis   Assessment and Plan:   COPD exacerbation (HCC) Chronic hypoxemic respiratory failure. Acute on chronic respite failure ruled out. Patient had a worsening short of breath, reviewed chart, patient was on 3 L oxygen  at home, no documented hypoxemia at the time of admission.  Patient does not have acute respite failure. Patient received Rocephin , steroids. Scheduled bronchodilator. Patient still has significant wheezing today, not ready for discharge.  Completed antibiotics.  Decrease dose of steroids to prednisone  20 mg daily.   Essential hypertension - We will continue antihypertensive therapy.   Anxiety and depression - We will continue Xanax  and citalopram .   GERD without esophagitis - We will continue PPI therapy.   BPH (benign prostatic hyperplasia) - We will continue Flomax .     Subjective:  Chief complaint Short of breath and wheezing.  Physical Exam: Vitals:   11/16/23 1524 11/16/23 1821 11/16/23 2001 11/17/23 0801  BP:  133/83  125/70 124/85  Pulse: 87  80 64  Resp: 18  19 16   Temp: 98.7 F (37.1 C)  98 F (36.7 C) 98 F (36.7 C)  TempSrc: Oral  Oral Oral  SpO2: 100% 99% 99% 100%   General exam: Appears calm and comfortable  Respiratory system: Decreased breath sounds with wheezes. Respiratory effort normal. Cardiovascular system: S1 & S2 heard, RRR. No JVD, murmurs, rubs, gallops or clicks. No pedal edema. Gastrointestinal system: Abdomen is nondistended, soft and nontender. No organomegaly or masses felt. Normal bowel sounds heard. Central nervous system: Alert and oriented. No focal neurological deficits. Extremities: Symmetric 5 x 5 power. Skin: No rashes, lesions or ulcers Psychiatry: Judgement and insight appear normal. Mood & affect appropriate.    Data Reviewed:  There are no new results to review at this time.  Family Communication: None  Disposition: Status is: Inpatient Remains inpatient appropriate because: Severity of disease, IV treatment.     Time spent: 35 minutes  Author: Murvin Mana, MD 11/17/2023 11:31 AM  For on call review [email or web address].

## 2023-11-17 NOTE — Plan of Care (Signed)

## 2023-11-17 NOTE — TOC CM/SW Note (Signed)
 Transition of Care [facility]) - Inpatient Brief Assessment   Patient Details  Name: MCKALE HAFFEY MRN: [id number] Date of Birth: 1955-12-20  Transition of Care [facility]) CM/SW Contact:    Corean ONEIDA Haddock, RN Phone Number: 11/17/2023, 10:25 AM   Clinical Narrative:   Transition of Care [facility]) Screening Note   Patient Details  Name: HOYTE ZIEBELL Date of Birth: 11-Apr-1956   Transition of Care [facility]) CM/SW Contact:    Corean ONEIDA Haddock, RN Phone Number: 11/17/2023, 10:29 AM    Transition of Care Department [facility]) has reviewed patient and no TOC needs have been identified at this time. . If new patient transition needs arise, please place a TOC consult.    Transition of Care Asessment: Insurance and Status: Insurance coverage has been reviewed Patient has primary care physician: Yes     Prior/Current Home Services: No current home services Social Drivers of Health Review: SDOH reviewed no interventions necessary Readmission risk has been reviewed: Yes Transition of care needs: no transition of care needs at this time

## 2023-11-18 DIAGNOSIS — J441 Chronic obstructive pulmonary disease with (acute) exacerbation: Secondary | ICD-10-CM | POA: Diagnosis not present

## 2023-11-18 MED ORDER — SENNOSIDES-DOCUSATE SODIUM 8.6-50 MG PO TABS
2.0000 | ORAL_TABLET | Freq: Every day | ORAL | Status: DC
  Administered 2023-11-18: 2 via ORAL
  Filled 2023-11-18: qty 2

## 2023-11-18 MED ORDER — IPRATROPIUM-ALBUTEROL 0.5-2.5 (3) MG/3ML IN SOLN
3.0000 mL | Freq: Four times a day (QID) | RESPIRATORY_TRACT | Status: DC
Start: 2023-11-18 — End: 2023-11-20
  Administered 2023-11-18 – 2023-11-20 (×9): 3 mL via RESPIRATORY_TRACT
  Filled 2023-11-18 (×9): qty 3

## 2023-11-18 MED ORDER — BISACODYL 10 MG RE SUPP
10.0000 mg | Freq: Once | RECTAL | Status: AC
  Administered 2023-11-18: 10 mg via RECTAL
  Filled 2023-11-18: qty 1

## 2023-11-18 MED ORDER — SENNOSIDES-DOCUSATE SODIUM 8.6-50 MG PO TABS: 2.0000 | ORAL_TABLET | Freq: Every day | ORAL | Status: DC

## 2023-11-18 MED ORDER — ALBUTEROL SULFATE (2.5 MG/3ML) 0.083% IN NEBU: 2.5000 mg | INHALATION_SOLUTION | RESPIRATORY_TRACT | Status: DC | PRN

## 2023-11-18 NOTE — Progress Notes (Signed)
  Progress Note   Patient: Bryan Kelley [id number] DOB: January 02, 1956 DOA: 11/13/2023     5 DOS: the patient was seen and examined on 11/18/2023   Brief hospital course:  MARKEVIS MENZEL is a 68 y.o. African-American male with medical history significant for COPD, chronic respiratory failure on home O2 at 3 L/min, asthma, depression, and GERD, who presented to the emergency room with acute onset of worsening dyspnea with associated cough productive of yellowish sputum and wheezing over the last couple of days.  Patient has a normal white cell count, negative COVID and influenza.  Chest x-ray did not show any acute changes.  Patient was diagnosed with COPD exacerbation, was treated with steroids and antibiotics.    Assessment and Plan:  COPD exacerbation (HCC) Chronic hypoxemic respiratory failure. Acute on chronic respite failure ruled out. Patient presented for evaluation of worsening shortness of breath from his baseline.  With documentation of hypoxemia on admission and he remains on 3 L of oxygen  which is his baseline Patient still has significant wheezing today, not ready for discharge.  Completed antibiotics.  Decrease dose of steroids to prednisone  20 mg daily. Place patient on scheduled and as needed bronchodilator therapy    Essential hypertension - Continue amlodipine    Anxiety and depression - We will continue Xanax  and citalopram .   GERD without esophagitis - Continue PPI therapy.   BPH (benign prostatic hyperplasia) - We will continue Flomax .          Subjective: Patient is seen and examined at the bedside and is short of breath at rest  Physical Exam: Vitals:   11/17/23 1917 11/18/23 0350 11/18/23 0818 11/18/23 1135  BP: (!) 115/56 (!) 149/91 (!) 156/103   Pulse: 96 78 77 80  Resp: 20 20 18 18   Temp: 98 F (36.7 C) 97.6 F (36.4 C) 98.2 F (36.8 C)   TempSrc: Oral Oral    SpO2: 99% 99% 97% 98%   General exam: Appears calm and comfortable  Respiratory  system: Decreased breath sounds with diffuse expiratory wheezes. Respiratory effort normal. Cardiovascular system: S1 & S2 heard, RRR. No JVD, murmurs, rubs, gallops or clicks. No pedal edema. Gastrointestinal system: Abdomen is nondistended, soft and nontender. No organomegaly or masses felt. Normal bowel sounds heard. Central nervous system: Alert and oriented. No focal neurological deficits. Extremities: Symmetric 5 x 5 power. Skin: No rashes, lesions or ulcers Psychiatry: Judgement and insight appear normal. Mood & affect appropriate.       Data Reviewed:  There are no new results to review at this time.  Family Communication: Plan of care discussed with patient at the bedside and his sister over the phone.  All questions and concerns have been addressed.  They verbalized understanding and agree with the plan  Disposition: Status is: Inpatient Remains inpatient appropriate because: Continues to require frequent bronchodilator therapy  Planned Discharge Destination: Home    Time spent: 35  minutes  Author: Read Camel, MD 11/18/2023 1:17 PM  For on call review [email or web address].

## 2023-11-18 NOTE — Plan of Care (Signed)
  Problem: Clinical Measurements: Goal: Will remain free from infection Outcome: Progressing   Problem: Clinical Measurements: Goal: Diagnostic test results will improve Outcome: Progressing   Problem: Clinical Measurements: Goal: Respiratory complications will improve Outcome: Progressing   Problem: Clinical Measurements: Goal: Cardiovascular complication will be avoided Outcome: Progressing   Problem: Activity: Goal: Risk for activity intolerance will decrease Outcome: Progressing   Problem: Nutrition: Goal: Adequate nutrition will be maintained Outcome: Progressing

## 2023-11-19 DIAGNOSIS — J441 Chronic obstructive pulmonary disease with (acute) exacerbation: Secondary | ICD-10-CM | POA: Diagnosis not present

## 2023-11-19 MED ORDER — HYDROCORT-PRAMOXINE (PERIANAL) 1-1 % EX FOAM
1.0000 | Freq: Two times a day (BID) | CUTANEOUS | Status: DC
  Administered 2023-11-20: 1 via RECTAL
  Filled 2023-11-19: qty 0.3

## 2023-11-19 MED ORDER — BUDESONIDE 0.25 MG/2ML IN SUSP
0.2500 mg | Freq: Two times a day (BID) | RESPIRATORY_TRACT | Status: DC
  Administered 2023-11-19 – 2023-11-20 (×3): 0.25 mg via RESPIRATORY_TRACT
  Filled 2023-11-19 (×3): qty 2

## 2023-11-19 NOTE — Progress Notes (Signed)
  Progress Note   Patient: Bryan Kelley [id number] DOB: 1956/10/13 DOA: 11/13/2023     6 DOS: the patient was seen and examined on 11/19/2023   Brief hospital course:  Bryan Kelley is a 68 y.o. African-American male with medical history significant for COPD, chronic respiratory failure on home O2 at 3 L/min, asthma, depression, and GERD, who presented to the emergency room with acute onset of worsening dyspnea with associated cough productive of yellowish sputum and wheezing over the last couple of days.  Patient has a normal white cell count, negative COVID and influenza.  Chest x-ray did not show any acute changes.  Patient was diagnosed with COPD exacerbation, was treated with steroids and antibiotics.     Assessment and Plan:  COPD exacerbation (HCC) Chronic hypoxemic respiratory failure. Acute on chronic respite failure ruled out. Patient presented for evaluation of worsening shortness of breath from his baseline. No documentation of hypoxemia on admission and he remains on 3 L of oxygen which is his baseline Patient continues to wheeze but improved.  Completed antibiotics.  Decrease dose of steroids to prednisone 20 mg daily. Place patient on scheduled and as needed bronchodilator therapy For possible discharge in a.m.     Essential hypertension - Continue amlodipine   Anxiety and depression - Continue Xanax and citalopram.   GERD without esophagitis - Continue PPI therapy.   BPH (benign prostatic hyperplasia) - Continue Flomax.         Subjective: Sleeping but arouses easily  Physical Exam: Vitals:   11/19/23 0301 11/19/23 0421 11/19/23 0750 11/19/23 0816  BP:  138/76  116/82  Pulse:  72 76 69  Resp:  18 16 18   Temp:  98.3 F (36.8 C)  98 F (36.7 C)  TempSrc:  Oral    SpO2: 99% 99% 100% 100%   General exam: Appears calm and comfortable  Respiratory system: Decreased breath sounds with scattered expiratory wheezes. Respiratory effort  normal. Cardiovascular system: S1 & S2 heard, RRR. No JVD, murmurs, rubs, gallops or clicks. No pedal edema. Gastrointestinal system: Abdomen is nondistended, soft and nontender. No organomegaly or masses felt. Normal bowel sounds heard. Central nervous system: Alert and oriented. No focal neurological deficits. Extremities: Symmetric 5 x 5 power. Skin: No rashes, lesions or ulcers Psychiatry: Judgement and insight appear normal. Mood & affect appropriate.     Data Reviewed:  There are no new results to review at this time.  Family Communication: Plan of care discussed with patient  Disposition: Status is: Inpatient Remains inpatient appropriate because: Possible discharge in a.m.  Planned Discharge Destination: Home    Time spent: 32 minutes  Author: Lucile Shutters, MD 11/19/2023 12:37 PM  For on call review [email or web address]

## 2023-11-19 NOTE — Plan of Care (Signed)
  Problem: Clinical Measurements: Goal: Will remain free from infection Outcome: Progressing   Problem: Clinical Measurements: Goal: Diagnostic test results will improve Outcome: Progressing   Problem: Clinical Measurements: Goal: Respiratory complications will improve Outcome: Progressing   Problem: Clinical Measurements: Goal: Cardiovascular complication will be avoided Outcome: Progressing   

## 2023-11-20 DIAGNOSIS — J441 Chronic obstructive pulmonary disease with (acute) exacerbation: Secondary | ICD-10-CM | POA: Diagnosis not present

## 2023-11-20 MED ORDER — HYDROCORT-PRAMOXINE (PERIANAL) 1-1 % EX FOAM: 1.0000 | Freq: Two times a day (BID) | CUTANEOUS | 0 refills | Status: AC

## 2023-11-20 MED ORDER — PREDNISONE 20 MG PO TABS: 20.0000 mg | ORAL_TABLET | Freq: Every day | ORAL | 0 refills | Status: DC

## 2023-11-20 MED ORDER — PREDNISONE 20 MG PO TABS: 20.0000 mg | ORAL_TABLET | Freq: Every day | ORAL | 0 refills | Status: AC

## 2023-11-20 MED ORDER — LACTULOSE 10 GM/15ML PO SOLN
30.0000 g | Freq: Once | ORAL | Status: AC
  Administered 2023-11-20: 30 g via ORAL
  Filled 2023-11-20: qty 60

## 2023-11-20 MED ORDER — HYDROCORT-PRAMOXINE (PERIANAL) 1-1 % EX FOAM: 1.0000 | Freq: Two times a day (BID) | CUTANEOUS | 0 refills | Status: DC

## 2023-11-20 MED ORDER — SENNOSIDES-DOCUSATE SODIUM 8.6-50 MG PO TABS: 1.0000 | ORAL_TABLET | Freq: Two times a day (BID) | ORAL | Status: DC

## 2023-11-20 MED ORDER — POLYETHYLENE GLYCOL 3350 17 G PO PACK: 17.0000 g | PACK | Freq: Every day | ORAL | 0 refills | Status: DC

## 2023-11-20 NOTE — Discharge Summary (Signed)
Physician Discharge Summary   Patient: Bryan Kelley MRN: [id number] DOB: [birth year]  Admit date:     11/13/2023  Discharge date: 11/20/23  Discharge Physician: Lindzey Zent   PCP: [facility]   Recommendations at discharge:   Complete systemic steroid as recommended Follow-up with primary care provider as an outpatient  Discharge Diagnoses: Principal Problem:   COPD exacerbation (HCC) Active Problems:   Chronic hypoxemic respiratory failure (HCC)   Essential hypertension   Anxiety and depression   BPH (benign prostatic hyperplasia)   GERD without esophagitis  Resolved Problems:   * No resolved hospital problems. *  Hospital Course:  Bryan Kelley is a 68 y.o. African-American male with medical history significant for COPD, chronic respiratory failure on home O2 at 3 L/min, asthma, depression, and GERD, who presented to the emergency room with acute onset of worsening dyspnea with associated cough productive of yellowish sputum and wheezing over the last couple of days.  He called his PCP who called and antibiotic therapy for him but he has not been able to fill it yet.  No chest pain or palpitations.  No fever or chills.  He denies any lower extremity edema or pain.  No headache or dizziness or blurred vision.  No bleeding diathesis.  No dysuria, oliguria or hematuria or flank pain.   ED Course: Upon presentation to the emergency room, BP was 163/90 with heart rate 106 and respiratory to 24 Pulse oximeter was 96% on facemask and later on 3 L of O2 by nasal cannula.  Labs revealed chloride of 96 calcium 8.8 with otherwise unremarkable BMP.  High sensitive troponin I was 8 and later 7.  CBC with within normal.  Respiratory panel came back negative.   EKG as reviewed by me : EKG showed sinus tachycardia with rate of 108 with biatrial enlargement. Imaging: Two-view chest x-ray showed no acute cardiopulmonary disease.   The patient was given DuoNebs, IV  Solu-Medrol, IV magnesium sulfate, IV Rocephin and Zithromax.  He will be admitted to a medical telemetry bed for further evaluation and management.     Assessment and Plan:  COPD exacerbation (HCC) Chronic hypoxemic respiratory failure. Acute on chronic respiratory failure ruled out. Patient presented for evaluation of worsening shortness of breath from his baseline. No documentation of hypoxemia on admission and he remains on 3 L of oxygen which is his baseline Patient symptoms have improved on systemic steroids and bronchodilator therapy He will be discharged home to complete a course of systemic steroids     Essential hypertension - Continue amlodipine   Anxiety and depression - Continue  citalopram.   GERD without esophagitis - Continue PPI therapy.   BPH (benign prostatic hyperplasia) - Continue Flomax.        Consultants: None Procedures performed: None  Disposition: Home Diet recommendation:  Discharge Diet Orders (From admission, onward)     Start     Ordered   11/20/23 0000  Diet - low sodium heart healthy        11/20/23 1205           Cardiac diet DISCHARGE MEDICATION: Allergies as of 11/20/2023   No Known Allergies      Medication List     TAKE these medications    acetaminophen 325 MG tablet Commonly known as: TYLENOL Take 2 tablets (650 mg total) by mouth every 6 (six) hours as needed for mild pain, fever or headache.   albuterol 108 (90 Base) MCG/ACT inhaler Commonly  known as: VENTOLIN HFA Inhale 2 puffs into the lungs every 6 (six) hours as needed for wheezing or shortness of breath.   albuterol (2.5 MG/3ML) 0.083% nebulizer solution Commonly known as: PROVENTIL Take 2.5 mg by nebulization every 4 (four) hours as needed for wheezing.   amLODipine 10 MG tablet Commonly known as: NORVASC Take 10 mg by mouth daily.   Breo Ellipta 200-25 MCG/ACT Aepb Generic drug: fluticasone furoate-vilanterol Inhale 1 puff into the lungs  daily.   hydrocortisone-pramoxine rectal foam Commonly known as: PROCTOFOAM-HC Place 1 applicator rectally 2 (two) times daily for 10 days.   montelukast 10 MG tablet Commonly known as: SINGULAIR Take 1 tablet (10 mg total) by mouth daily. What changed: when to take this   pantoprazole 20 MG tablet Commonly known as: PROTONIX Take 1 tablet (20 mg total) by mouth daily.   polyethylene glycol 17 g packet Commonly known as: MIRALAX / GLYCOLAX Take 17 g by mouth daily.   predniSONE 20 MG tablet Commonly known as: DELTASONE Take 1 tablet (20 mg total) by mouth daily with breakfast for 5 days. Start taking on: November 21, 2023   senna-docusate 8.6-50 MG tablet Commonly known as: Senokot-S Take 1 tablet by mouth 2 (two) times daily.   Simbrinza 1-0.2 % Susp Generic drug: Brinzolamide-Brimonidine Place 1 drop into the left eye in the morning and at bedtime.   tamsulosin 0.4 MG Caps capsule Commonly known as: FLOMAX Take 1 capsule (0.4 mg total) by mouth daily.   theophylline 400 MG 24 hr tablet Commonly known as: UNIPHYL Take 1 tablet (400 mg total) by mouth at bedtime.        Discharge Exam: There were no vitals filed for this visit.  General exam: Appears calm and comfortable  Respiratory system: Bilateral air entry, respiratory effort normal.  No wheezes or crackles Cardiovascular system: S1 & S2 heard, RRR. No JVD, murmurs, rubs, gallops or clicks. No pedal edema. Gastrointestinal system: Abdomen is nondistended, soft and nontender. No organomegaly or masses felt. Normal bowel sounds heard. Central nervous system: Alert and oriented. No focal neurological deficits. Extremities: Symmetric 5 x 5 power. Skin: No rashes, lesions or ulcers Psychiatry: Judgement and insight appear normal. Mood & affect appropriate.         Condition at discharge: stable  The results of significant diagnostics from this hospitalization (including imaging, microbiology, ancillary  and laboratory) are listed below for reference.   Imaging Studies: DG Chest 2 View Result Date: 11/13/2023 CLINICAL DATA:  Shortness of breath EXAM: CHEST - 2 VIEW COMPARISON:  10/23/2023 FINDINGS: The heart size and mediastinal contours are within normal limits. Both lungs are clear. The visualized skeletal structures are unremarkable. Metallic foreign body is noted the right anterior chest wall stable from prior CT. IMPRESSION: No active cardiopulmonary disease. Electronically Signed   By: Alcide Clever M.D.   On: 11/13/2023 00:29   CT CHEST LCS NODULE F/U LOW DOSE WO CONTRAST Result Date: 11/09/2023 CLINICAL DATA:  Former 24 pack-year smoker, quit 2023.  Lung nodule. EXAM: CT CHEST WITHOUT CONTRAST FOR LUNG CANCER SCREENING NODULE FOLLOW-UP TECHNIQUE: Multidetector CT imaging of the chest was performed following the standard protocol without IV contrast. RADIATION DOSE REDUCTION: This exam was performed according to the departmental dose-optimization program which includes automated exposure control, adjustment of the mA and/or kV according to patient size and/or use of iterative reconstruction technique. COMPARISON:  07/05/2023 and 08/20/2022. FINDINGS: Cardiovascular: Atherosclerotic calcification of the aorta with age advanced involvement of all 3  coronary arteries. Ascending aorta measures 4.1 cm (5/103), stable. Enlarged pulmonic trunk. Heart size normal. No pericardial effusion. Mediastinum/Nodes: No pathologically enlarged mediastinal or axillary lymph nodes. Hilar regions are difficult to definitively evaluate without IV contrast. Air in the esophagus can be seen with dysmotility. Lungs/Pleura: Centrilobular and paraseptal emphysema. Multiloculated atypical pulmonary cyst in the anterior left lower lobe segments poorly in DynaCAD lung software, measuring 0.8 x 1.3 cm (3/234), stable from 08/20/2022. New or progressive areas of peribronchovascular nodularity and consolidation in the lungs bilaterally  (3/105, 191, 203, 209 and 220). No pleural fluid. Debris in the airway. Upper Abdomen: Low-attenuation lesions in the kidneys. No specific follow-up necessary. Visualized portions of the liver, gallbladder, adrenal glands, kidneys, spleen, pancreas, stomach and bowel are otherwise grossly unremarkable. No upper abdominal adenopathy. Musculoskeletal: Degenerative changes in the spine. IMPRESSION: 1. New or progressive areas of peribronchovascular nodularity/consolidation, favoring an infectious/inflammatory etiology. Lung-RADS 0, incomplete. Recommend follow-up low-dose lung cancer screening CT in 1-3 months from today study date, after appropriate therapy. These results will be called to the ordering clinician or representative by the Radiologist Assistant, and communication documented in the PACS or [employer]. 2. Age advanced three-vessel coronary artery calcification. 3. 4.1 cm ascending aortic aneurysm, stable. Recommend annual imaging followup by CTA or MRA. This recommendation follows 2010 ACCF/AHA/AATS/ACR/ASA/SCA/SCAI/SIR/STS/SVM Guidelines for the Diagnosis and Management of Patients with Thoracic Aortic Disease. Circulation. 2010; 121: Z610-R604. Aortic aneurysm NOS (ICD10-I71.9). 4.  Aortic atherosclerosis (ICD10-I70.0). 5. Enlarged pulmonic trunk, indicative of pulmonary arterial hypertension. 6.  Emphysema (ICD10-J43.9). Electronically Signed   By: Leanna Battles M.D.   On: 11/09/2023 10:50    Microbiology: Results for orders placed or performed during the hospital encounter of 11/13/23  Resp panel by RT-PCR (RSV, Flu A&B, Covid) Anterior Nasal Swab     Status: None   Collection Time: 11/13/23  1:19 AM   Specimen: Anterior Nasal Swab  Result Value Ref Range Status   SARS Coronavirus 2 by RT PCR NEGATIVE NEGATIVE Final    Comment: (NOTE) SARS-CoV-2 target nucleic acids are NOT DETECTED.  The SARS-CoV-2 RNA is generally detectable in upper respiratory specimens during the acute phase  of infection. The lowest concentration of SARS-CoV-2 viral copies this assay can detect is 138 copies/mL. A negative result does not preclude SARS-Cov-2 infection and should not be used as the sole basis for treatment or other patient management decisions. A negative result may occur with  improper specimen collection/handling, submission of specimen other than nasopharyngeal swab, presence of viral mutation(s) within the areas targeted by this assay, and inadequate number of viral copies(<138 copies/mL). A negative result must be combined with clinical observations, patient history, and epidemiological information. The expected result is Negative.  Fact Sheet for Patients:  BloggerCourse.com  Fact Sheet for Healthcare Providers:  SeriousBroker.it  This test is no t yet approved or cleared by the [place] FDA and  has been authorized for detection and/or diagnosis of SARS-CoV-2 by FDA under an Emergency Use Authorization (EUA). This EUA will remain  in effect (meaning this test can be used) for the duration of the COVID-19 declaration under Section 564(b)(1) of the Act, 21 U.S.C.section 360bbb-3(b)(1), unless the authorization is terminated  or revoked sooner.       Influenza A by PCR NEGATIVE NEGATIVE Final   Influenza B by PCR NEGATIVE NEGATIVE Final    Comment: (NOTE) The Xpert Xpress SARS-CoV-2/FLU/RSV plus assay is intended as an aid in the diagnosis of influenza from Nasopharyngeal swab specimens and  should not be used as a sole basis for treatment. Nasal washings and aspirates are unacceptable for Xpert Xpress SARS-CoV-2/FLU/RSV testing.  Fact Sheet for Patients: BloggerCourse.com  Fact Sheet for Healthcare Providers: SeriousBroker.it  This test is not yet approved or cleared by the [place] FDA and has been authorized for detection and/or diagnosis of  SARS-CoV-2 by FDA under an Emergency Use Authorization (EUA). This EUA will remain in effect (meaning this test can be used) for the duration of the COVID-19 declaration under Section 564(b)(1) of the Act, 21 U.S.C. section 360bbb-3(b)(1), unless the authorization is terminated or revoked.     Resp Syncytial Virus by PCR NEGATIVE NEGATIVE Final    Comment: (NOTE) Fact Sheet for Patients: BloggerCourse.com  Fact Sheet for Healthcare Providers: SeriousBroker.it  This test is not yet approved or cleared by the [place] FDA and has been authorized for detection and/or diagnosis of SARS-CoV-2 by FDA under an Emergency Use Authorization (EUA). This EUA will remain in effect (meaning this test can be used) for the duration of the COVID-19 declaration under Section 564(b)(1) of the Act, 21 U.S.C. section 360bbb-3(b)(1), unless the authorization is terminated or revoked.  Performed at [facility], [address] [place], [place] [address]     Labs: CBC: No results for input(s): "WBC", "NEUTROABS", "HGB", "HCT", "MCV", "PLT" in the last 168 hours. Basic Metabolic Panel: No results for input(s): "NA", "K", "CL", "CO2", "GLUCOSE", "BUN", "CREATININE", "CALCIUM", "MG", "PHOS" in the last 168 hours. Liver Function Tests: No results for input(s): "AST", "ALT", "ALKPHOS", "BILITOT", "PROT", "ALBUMIN" in the last 168 hours. CBG: No results for input(s): "GLUCAP" in the last 168 hours.  Discharge time spent: greater than 30 minutes.  Signed: Lucile Shutters, MD [facility] 11/20/2023

## 2023-11-20 NOTE — Plan of Care (Signed)
  Problem: Clinical Measurements: Goal: Respiratory complications will improve Outcome: Progressing   Problem: Elimination: Goal: Will not experience complications related to bowel motility Outcome: Progressing  Patient had small BM today. Patient encouraged to increase fluids and ambulate more often.   Madie Reno, RN

## 2023-11-20 NOTE — TOC Transition Note (Signed)
Transition of Care [facility]) - Discharge Note   Patient Details  Name: Bryan Kelley MRN: [id number] Date of Birth: [birth year]  [facility] CM/SW Contact:  Chapman Fitch, RN Phone Number: 11/20/2023, 12:13 PM   Clinical Narrative:    Patient to discharge today Patient request cab voucher at discharge. Patient states that he does not have anyone to pick him up, and does not have the means to pay for a cab Cab voucher completed and on chart Rider waiver signed by patient and placed in chart Bedside RN checked ambulatory sats on RA and patient does not need O2 for transport         Patient Goals and CMS Choice            Discharge Placement                       Discharge Plan and Services Additional resources added to the After Visit Summary for                                       Social Drivers of Health (SDOH) Interventions SDOH Screenings   Food Insecurity: No Food Insecurity (11/15/2023)  Housing: Low Risk  (11/15/2023)  Transportation Needs: No Transportation Needs (11/15/2023)  Utilities: Not At Risk (11/15/2023)  Social Connections: Patient Declined (11/15/2023)  Tobacco Use: Medium Risk (11/15/2023)     Readmission Risk Interventions    05/10/2021   10:26 AM  Readmission Risk Prevention Plan  Transportation Screening Complete  PCP or Specialist Appt within 3-5 Days Complete  Social Work Consult for Recovery Care Planning/Counseling Complete  Palliative Care Screening Not Applicable  Medication Review Oceanographer) Complete

## 2023-11-20 NOTE — Progress Notes (Signed)
Patient Saturations on Room Air at Rest = 95%  Patient Saturations on [employer] while Ambulating = 93%  Madie Reno, RN

## 2023-11-20 NOTE — Plan of Care (Signed)

## 2023-11-20 NOTE — Progress Notes (Signed)
[employer] called for patient pickup. Per dispatcher will be an hour before someone is available. Dispatcher given unit number to call fifteen minutes ahead when on the way. Patient updated.   Madie Reno, RN

## 2023-11-23 ENCOUNTER — Other Ambulatory Visit: Payer: Self-pay

## 2023-11-23 DIAGNOSIS — R911 Solitary pulmonary nodule: Secondary | ICD-10-CM

## 2023-11-23 DIAGNOSIS — Z122 Encounter for screening for malignant neoplasm of respiratory organs: Secondary | ICD-10-CM

## 2023-11-23 DIAGNOSIS — Z87891 Personal history of nicotine dependence: Secondary | ICD-10-CM

## 2023-11-23 NOTE — Telephone Encounter (Signed)
Spoke with patient and reviewed results. He was recently discharged from the hospital after being treated with IV antibiotics and steroids. Spoke with sister Aram Beecham per patient request who was present at time of call. Patient is in agreement to repeat scan. CT scheduled for 12/28/2023. NO additional questions. Patient and sister have saved office number so that they will be aware of call next time for results.

## 2023-12-28 ENCOUNTER — Ambulatory Visit: Payer: Medicare Other

## 2024-01-04 ENCOUNTER — Ambulatory Visit
Admission: RE | Admit: 2024-01-04 | Discharge: 2024-01-04 | Disposition: A | Discharge status: Home / Self Care | Payer: Medicare Other | Source: Ambulatory Visit | Attending: Acute Care | Admitting: Acute Care

## 2024-01-04 DIAGNOSIS — Z09 Encounter for follow-up examination after completed treatment for conditions other than malignant neoplasm: Secondary | ICD-10-CM | POA: Diagnosis not present

## 2024-01-04 DIAGNOSIS — Z87891 Personal history of nicotine dependence: Secondary | ICD-10-CM | POA: Diagnosis present

## 2024-01-04 DIAGNOSIS — Z122 Encounter for screening for malignant neoplasm of respiratory organs: Secondary | ICD-10-CM | POA: Diagnosis present

## 2024-01-04 DIAGNOSIS — Z8619 Personal history of other infectious and parasitic diseases: Secondary | ICD-10-CM | POA: Diagnosis not present

## 2024-02-01 ENCOUNTER — Other Ambulatory Visit: Payer: Self-pay | Admitting: Acute Care

## 2024-02-01 DIAGNOSIS — Z122 Encounter for screening for malignant neoplasm of respiratory organs: Secondary | ICD-10-CM

## 2024-02-01 DIAGNOSIS — Z87891 Personal history of nicotine dependence: Secondary | ICD-10-CM

## 2024-07-28 ENCOUNTER — Other Ambulatory Visit: Payer: Self-pay

## 2024-07-28 ENCOUNTER — Encounter
Admission: EM | Disposition: A | Discharge status: Home / Self Care | Payer: Self-pay | Source: Home / Self Care | Attending: Internal Medicine

## 2024-07-28 ENCOUNTER — Inpatient Hospital Stay
Admission: EM | Admit: 2024-07-28 | Discharge: 2024-08-02 | DRG: 286 | Disposition: A | Discharge status: Home / Self Care | Attending: Internal Medicine | Admitting: Internal Medicine

## 2024-07-28 DIAGNOSIS — Z886 Allergy status to analgesic agent status: Secondary | ICD-10-CM

## 2024-07-28 DIAGNOSIS — Z7951 Long term (current) use of inhaled steroids: Secondary | ICD-10-CM

## 2024-07-28 DIAGNOSIS — I480 Paroxysmal atrial fibrillation: Secondary | ICD-10-CM | POA: Diagnosis not present

## 2024-07-28 DIAGNOSIS — I4891 Unspecified atrial fibrillation: Secondary | ICD-10-CM | POA: Diagnosis not present

## 2024-07-28 DIAGNOSIS — R079 Chest pain, unspecified: Secondary | ICD-10-CM | POA: Insufficient documentation

## 2024-07-28 DIAGNOSIS — R739 Hyperglycemia, unspecified: Secondary | ICD-10-CM | POA: Diagnosis present

## 2024-07-28 DIAGNOSIS — F32A Depression, unspecified: Secondary | ICD-10-CM | POA: Diagnosis not present

## 2024-07-28 DIAGNOSIS — K219 Gastro-esophageal reflux disease without esophagitis: Secondary | ICD-10-CM | POA: Diagnosis not present

## 2024-07-28 DIAGNOSIS — Z833 Family history of diabetes mellitus: Secondary | ICD-10-CM

## 2024-07-28 DIAGNOSIS — J441 Chronic obstructive pulmonary disease with (acute) exacerbation: Secondary | ICD-10-CM | POA: Diagnosis not present

## 2024-07-28 DIAGNOSIS — Z9889 Other specified postprocedural states: Secondary | ICD-10-CM | POA: Diagnosis not present

## 2024-07-28 DIAGNOSIS — Z87891 Personal history of nicotine dependence: Secondary | ICD-10-CM | POA: Diagnosis not present

## 2024-07-28 DIAGNOSIS — E0590 Thyrotoxicosis, unspecified without thyrotoxic crisis or storm: Secondary | ICD-10-CM | POA: Diagnosis not present

## 2024-07-28 DIAGNOSIS — Z79899 Other long term (current) drug therapy: Secondary | ICD-10-CM

## 2024-07-28 DIAGNOSIS — I2119 ST elevation (STEMI) myocardial infarction involving other coronary artery of inferior wall: Secondary | ICD-10-CM | POA: Diagnosis not present

## 2024-07-28 DIAGNOSIS — R9431 Abnormal electrocardiogram [ECG] [EKG]: Secondary | ICD-10-CM | POA: Diagnosis not present

## 2024-07-28 DIAGNOSIS — R000 Tachycardia, unspecified: Secondary | ICD-10-CM

## 2024-07-28 DIAGNOSIS — Z9981 Dependence on supplemental oxygen: Secondary | ICD-10-CM | POA: Diagnosis not present

## 2024-07-28 DIAGNOSIS — N400 Enlarged prostate without lower urinary tract symptoms: Secondary | ICD-10-CM | POA: Diagnosis not present

## 2024-07-28 DIAGNOSIS — R911 Solitary pulmonary nodule: Secondary | ICD-10-CM | POA: Diagnosis not present

## 2024-07-28 DIAGNOSIS — I219 Acute myocardial infarction, unspecified: Secondary | ICD-10-CM

## 2024-07-28 DIAGNOSIS — R1084 Generalized abdominal pain: Secondary | ICD-10-CM

## 2024-07-28 DIAGNOSIS — I10 Essential (primary) hypertension: Secondary | ICD-10-CM | POA: Diagnosis present

## 2024-07-28 DIAGNOSIS — J9611 Chronic respiratory failure with hypoxia: Secondary | ICD-10-CM | POA: Diagnosis not present

## 2024-07-28 DIAGNOSIS — I2510 Atherosclerotic heart disease of native coronary artery without angina pectoris: Principal | ICD-10-CM

## 2024-07-28 DIAGNOSIS — H548 Legal blindness, as defined in USA: Secondary | ICD-10-CM | POA: Diagnosis not present

## 2024-07-28 DIAGNOSIS — E1165 Type 2 diabetes mellitus with hyperglycemia: Secondary | ICD-10-CM | POA: Diagnosis not present

## 2024-07-28 DIAGNOSIS — K5900 Constipation, unspecified: Secondary | ICD-10-CM | POA: Diagnosis present

## 2024-07-28 DIAGNOSIS — J9621 Acute and chronic respiratory failure with hypoxia: Secondary | ICD-10-CM

## 2024-07-28 DIAGNOSIS — Z8249 Family history of ischemic heart disease and other diseases of the circulatory system: Secondary | ICD-10-CM | POA: Diagnosis not present

## 2024-07-28 DIAGNOSIS — I509 Heart failure, unspecified: Secondary | ICD-10-CM | POA: Diagnosis not present

## 2024-07-28 DIAGNOSIS — I4892 Unspecified atrial flutter: Secondary | ICD-10-CM | POA: Diagnosis not present

## 2024-07-28 DIAGNOSIS — F418 Other specified anxiety disorders: Secondary | ICD-10-CM | POA: Diagnosis not present

## 2024-07-28 DIAGNOSIS — K5909 Other constipation: Secondary | ICD-10-CM | POA: Diagnosis not present

## 2024-07-28 DIAGNOSIS — F419 Anxiety disorder, unspecified: Secondary | ICD-10-CM | POA: Diagnosis present

## 2024-07-28 HISTORY — PX: LEFT HEART CATH AND CORONARY ANGIOGRAPHY: CATH118249

## 2024-07-28 HISTORY — DX: Acute and chronic respiratory failure with hypoxia: J96.21

## 2024-07-28 HISTORY — DX: Abnormal electrocardiogram (ECG) (EKG): R94.31

## 2024-07-28 LAB — TROPONIN I (HIGH SENSITIVITY): Troponin I (High Sensitivity): 16 ng/L (ref <18)

## 2024-07-28 LAB — GLUCOSE, CAPILLARY: Glucose-Capillary: 126 mg/dL — ABNORMAL HIGH (ref 70–99)

## 2024-07-28 LAB — POCT I-STAT CREATININE: Creatinine, Ser: 0.8 mg/dL (ref 0.61–1.24)

## 2024-07-28 SURGERY — LEFT HEART CATH AND CORONARY ANGIOGRAPHY
Anesthesia: Moderate Sedation

## 2024-07-28 MED ORDER — ADENOSINE 6 MG/2ML IV SOLN
INTRAVENOUS | Status: AC
Start: 2024-07-28 — End: 2024-07-28
  Filled 2024-07-28: qty 4

## 2024-07-28 MED ORDER — ACETAMINOPHEN 650 MG RE SUPP: 650.0000 mg | Freq: Four times a day (QID) | RECTAL | Status: DC | PRN

## 2024-07-28 MED ORDER — SODIUM CHLORIDE 0.9 % IV SOLN: 250.0000 mL | INTRAVENOUS | Status: AC | PRN

## 2024-07-28 MED ORDER — DILTIAZEM HCL 25 MG/5ML IV SOLN
INTRAVENOUS | Status: AC
  Filled 2024-07-28: qty 5

## 2024-07-28 MED ORDER — FENTANYL CITRATE (PF) 100 MCG/2ML IJ SOLN
INTRAMUSCULAR | Status: DC | PRN
  Administered 2024-07-28 (×2): 25 ug via INTRAVENOUS

## 2024-07-28 MED ORDER — PREDNISONE 20 MG PO TABS: 40.0000 mg | ORAL_TABLET | Freq: Every day | ORAL | Status: DC

## 2024-07-28 MED ORDER — ACETAMINOPHEN 325 MG PO TABS: 650.0000 mg | ORAL_TABLET | ORAL | Status: DC | PRN

## 2024-07-28 MED ORDER — ADENOSINE 6 MG/2ML IV SOLN
INTRAVENOUS | Status: DC | PRN
  Administered 2024-07-28: 12 mg via INTRAVENOUS
  Administered 2024-07-28: 6 mg via INTRAVENOUS

## 2024-07-28 MED ORDER — HEPARIN (PORCINE) IN NACL 1000-0.9 UT/500ML-% IV SOLN
INTRAVENOUS | Status: DC | PRN
  Administered 2024-07-28 (×2): 500 mL

## 2024-07-28 MED ORDER — FREE WATER
250.0000 mL | Freq: Once | Status: AC
Start: 2024-07-28 — End: 2024-07-28
  Administered 2024-07-28: 250 mL via ORAL

## 2024-07-28 MED ORDER — LOSARTAN POTASSIUM 50 MG PO TABS
50.0000 mg | ORAL_TABLET | Freq: Every day | ORAL | Status: DC
  Administered 2024-07-29 – 2024-08-02 (×5): 50 mg via ORAL
  Filled 2024-07-28 (×5): qty 1

## 2024-07-28 MED ORDER — ACETAMINOPHEN 325 MG PO TABS: 650.0000 mg | ORAL_TABLET | Freq: Four times a day (QID) | ORAL | Status: DC | PRN

## 2024-07-28 MED ORDER — MONTELUKAST SODIUM 10 MG PO TABS
10.0000 mg | ORAL_TABLET | Freq: Every day | ORAL | Status: DC
  Administered 2024-07-28 – 2024-08-01 (×5): 10 mg via ORAL
  Filled 2024-07-28 (×5): qty 1

## 2024-07-28 MED ORDER — IPRATROPIUM-ALBUTEROL 0.5-2.5 (3) MG/3ML IN SOLN
3.0000 mL | Freq: Four times a day (QID) | RESPIRATORY_TRACT | Status: DC
  Administered 2024-07-29 (×2): 3 mL via RESPIRATORY_TRACT
  Filled 2024-07-28 (×3): qty 3

## 2024-07-28 MED ORDER — SODIUM CHLORIDE 0.9% FLUSH: 3.0000 mL | INTRAVENOUS | Status: DC | PRN

## 2024-07-28 MED ORDER — PANTOPRAZOLE SODIUM 20 MG PO TBEC
20.0000 mg | DELAYED_RELEASE_TABLET | Freq: Every day | ORAL | Status: DC
  Administered 2024-07-29 – 2024-08-02 (×5): 20 mg via ORAL
  Filled 2024-07-28 (×5): qty 1

## 2024-07-28 MED ORDER — HEPARIN (PORCINE) IN NACL 1000-0.9 UT/500ML-% IV SOLN
INTRAVENOUS | Status: AC
  Filled 2024-07-28: qty 1000

## 2024-07-28 MED ORDER — LIDOCAINE HCL 1 % IJ SOLN
INTRAMUSCULAR | Status: AC
  Filled 2024-07-28: qty 20

## 2024-07-28 MED ORDER — MIDAZOLAM HCL 2 MG/2ML IJ SOLN
INTRAMUSCULAR | Status: DC | PRN
  Administered 2024-07-28: 1 mg via INTRAVENOUS

## 2024-07-28 MED ORDER — ONDANSETRON HCL 4 MG/2ML IJ SOLN
4.0000 mg | Freq: Four times a day (QID) | INTRAMUSCULAR | Status: DC | PRN
  Administered 2024-07-30: 4 mg via INTRAVENOUS
  Filled 2024-07-28: qty 2

## 2024-07-28 MED ORDER — IOHEXOL 300 MG/ML  SOLN
INTRAMUSCULAR | Status: DC | PRN
  Administered 2024-07-28: 85 mL

## 2024-07-28 MED ORDER — HEPARIN SODIUM (PORCINE) 1000 UNIT/ML IJ SOLN
INTRAMUSCULAR | Status: AC
  Filled 2024-07-28: qty 10

## 2024-07-28 MED ORDER — SENNOSIDES-DOCUSATE SODIUM 8.6-50 MG PO TABS
1.0000 | ORAL_TABLET | Freq: Two times a day (BID) | ORAL | Status: DC
  Administered 2024-07-28 – 2024-07-30 (×5): 1 via ORAL
  Filled 2024-07-28 (×6): qty 1

## 2024-07-28 MED ORDER — TAMSULOSIN HCL 0.4 MG PO CAPS
0.4000 mg | ORAL_CAPSULE | Freq: Every day | ORAL | Status: DC
  Administered 2024-07-29 – 2024-08-02 (×5): 0.4 mg via ORAL
  Filled 2024-07-28 (×5): qty 1

## 2024-07-28 MED ORDER — MORPHINE SULFATE (PF) 2 MG/ML IV SOLN
2.0000 mg | INTRAVENOUS | Status: DC | PRN
  Administered 2024-07-29 – 2024-07-30 (×2): 2 mg via INTRAVENOUS
  Filled 2024-07-28 (×2): qty 1

## 2024-07-28 MED ORDER — SODIUM CHLORIDE 0.9% FLUSH
3.0000 mL | Freq: Two times a day (BID) | INTRAVENOUS | Status: DC
  Administered 2024-07-28 – 2024-08-01 (×9): 3 mL via INTRAVENOUS

## 2024-07-28 MED ORDER — HEPARIN SODIUM (PORCINE) 1000 UNIT/ML IJ SOLN
INTRAMUSCULAR | Status: DC | PRN
  Administered 2024-07-28: 7000 [IU] via INTRAVENOUS

## 2024-07-28 MED ORDER — BRIMONIDINE TARTRATE 0.2 % OP SOLN
1.0000 [drp] | Freq: Three times a day (TID) | OPHTHALMIC | Status: DC
  Filled 2024-07-28: qty 5

## 2024-07-28 MED ORDER — METHYLPREDNISOLONE SODIUM SUCC 40 MG IJ SOLR
40.0000 mg | Freq: Two times a day (BID) | INTRAMUSCULAR | Status: AC
  Administered 2024-07-28 – 2024-07-29 (×2): 40 mg via INTRAVENOUS
  Filled 2024-07-28 (×2): qty 1

## 2024-07-28 MED ORDER — MIDAZOLAM HCL 2 MG/2ML IJ SOLN
INTRAMUSCULAR | Status: AC
  Filled 2024-07-28: qty 2

## 2024-07-28 MED ORDER — HYDRALAZINE HCL 20 MG/ML IJ SOLN: 10.0000 mg | INTRAMUSCULAR | Status: DC | PRN

## 2024-07-28 MED ORDER — ADENOSINE 6 MG/2ML IV SOLN
INTRAVENOUS | Status: AC
  Filled 2024-07-28: qty 2

## 2024-07-28 MED ORDER — LIDOCAINE HCL (PF) 1 % IJ SOLN
INTRAMUSCULAR | Status: DC | PRN
  Administered 2024-07-28: 2 mL

## 2024-07-28 MED ORDER — DILTIAZEM LOAD VIA INFUSION
10.0000 mg | Freq: Once | INTRAVENOUS | Status: AC
Start: 2024-07-28 — End: 2024-07-28
  Administered 2024-07-28: 10 mg via INTRAVENOUS
  Filled 2024-07-28: qty 10

## 2024-07-28 MED ORDER — FUROSEMIDE 10 MG/ML IJ SOLN
INTRAMUSCULAR | Status: AC
  Filled 2024-07-28: qty 4

## 2024-07-28 MED ORDER — ATORVASTATIN CALCIUM 80 MG PO TABS
80.0000 mg | ORAL_TABLET | Freq: Every day | ORAL | Status: DC
  Administered 2024-07-29 – 2024-08-02 (×5): 80 mg via ORAL
  Filled 2024-07-28 (×5): qty 1

## 2024-07-28 MED ORDER — FENTANYL CITRATE (PF) 100 MCG/2ML IJ SOLN
INTRAMUSCULAR | Status: AC
  Filled 2024-07-28: qty 2

## 2024-07-28 MED ORDER — ALBUTEROL SULFATE (2.5 MG/3ML) 0.083% IN NEBU
2.5000 mg | INHALATION_SOLUTION | RESPIRATORY_TRACT | Status: DC | PRN
  Administered 2024-07-29 – 2024-07-31 (×3): 2.5 mg via RESPIRATORY_TRACT
  Filled 2024-07-28 (×4): qty 3

## 2024-07-28 MED ORDER — FUROSEMIDE 10 MG/ML IJ SOLN
INTRAMUSCULAR | Status: DC | PRN
  Administered 2024-07-28: 40 mg via INTRAVENOUS

## 2024-07-28 MED ORDER — LABETALOL HCL 5 MG/ML IV SOLN: 10.0000 mg | INTRAVENOUS | Status: DC | PRN

## 2024-07-28 MED ORDER — HYDROCODONE-ACETAMINOPHEN 5-325 MG PO TABS
1.0000 | ORAL_TABLET | ORAL | Status: DC | PRN
  Administered 2024-07-29 – 2024-07-30 (×3): 2 via ORAL
  Administered 2024-08-01: 1 via ORAL
  Administered 2024-08-01: 2 via ORAL
  Filled 2024-07-28 (×3): qty 2
  Filled 2024-07-28: qty 1
  Filled 2024-07-28: qty 2

## 2024-07-28 MED ORDER — ALBUTEROL SULFATE (2.5 MG/3ML) 0.083% IN NEBU
2.5000 mg | INHALATION_SOLUTION | RESPIRATORY_TRACT | Status: DC | PRN
  Administered 2024-07-31: 2.5 mg via RESPIRATORY_TRACT
  Filled 2024-07-28: qty 3

## 2024-07-28 MED ORDER — VERAPAMIL HCL 2.5 MG/ML IV SOLN
INTRAVENOUS | Status: DC | PRN
  Administered 2024-07-28: 2.5 mg via INTRAVENOUS

## 2024-07-28 MED ORDER — POLYETHYLENE GLYCOL 3350 17 G PO PACK
17.0000 g | PACK | Freq: Every day | ORAL | Status: DC
  Administered 2024-07-29: 17 g via ORAL
  Filled 2024-07-28: qty 1

## 2024-07-28 MED ORDER — FLUTICASONE FUROATE-VILANTEROL 200-25 MCG/ACT IN AEPB
1.0000 | INHALATION_SPRAY | Freq: Every day | RESPIRATORY_TRACT | Status: DC
  Filled 2024-07-28: qty 28

## 2024-07-28 MED ORDER — THEOPHYLLINE ER 400 MG PO TB24
400.0000 mg | ORAL_TABLET | Freq: Every day | ORAL | Status: DC
Start: 2024-07-28 — End: 2024-08-02
  Administered 2024-07-28 – 2024-08-01 (×5): 400 mg via ORAL
  Filled 2024-07-28 (×5): qty 1

## 2024-07-28 MED ORDER — DILTIAZEM HCL-DEXTROSE 125-5 MG/125ML-% IV SOLN (PREMIX)
5.0000 mg/h | INTRAVENOUS | Status: DC
  Administered 2024-07-28: 5 mg/h via INTRAVENOUS
  Administered 2024-07-29: 10 mg/h via INTRAVENOUS
  Filled 2024-07-28 (×2): qty 125

## 2024-07-28 MED ORDER — VERAPAMIL HCL 2.5 MG/ML IV SOLN
INTRAVENOUS | Status: AC
  Filled 2024-07-28: qty 2

## 2024-07-28 MED ORDER — DILTIAZEM HCL 25 MG/5ML IV SOLN
INTRAVENOUS | Status: DC | PRN
  Administered 2024-07-28: 10 mg via INTRAVENOUS

## 2024-07-28 SURGICAL SUPPLY — 12 items
CATH 5F 110X4 TIG (CATHETERS) IMPLANT
CATH INFINITI 5FR ANG PIGTAIL (CATHETERS) IMPLANT
DEVICE RAD TR BAND REGULAR (VASCULAR PRODUCTS) IMPLANT
DRAPE BRACHIAL (DRAPES) IMPLANT
GLIDESHEATH SLEND SS 6F .021 (SHEATH) IMPLANT
GUIDEWIRE INQWIRE 1.5J.035X260 (WIRE) IMPLANT
KIT ENCORE 26 ADVANTAGE (KITS) IMPLANT
PACK CARDIAC CATH (CUSTOM PROCEDURE TRAY) ×1 IMPLANT
SET ATX-X65L (MISCELLANEOUS) IMPLANT
STATION PROTECTION PRESSURIZED (MISCELLANEOUS) IMPLANT
TUBING CIL FLEX 10 FLL-RA (TUBING) IMPLANT
WIRE NITINOL .018 (WIRE) IMPLANT

## 2024-07-28 NOTE — ED Triage Notes (Signed)
 Patient arrives via EMS for SOB and Chest Pain. EMS gave 2 duonebs, 125 ol solumedrol, 3L chronic O2

## 2024-07-28 NOTE — H&P (Signed)
 History and Physical    Patient: Bryan Kelley FMW:[id number] DOB: 1956-10-16 DOA: 07/28/2024 DOS: the patient was seen and examined on 07/28/2024 PCP: [facility]  Patient coming from: Home  Chief Complaint:  Chief Complaint  Patient presents with   Chest Pain   Shortness of Breath   HPI: Bryan Kelley is a 68 y.o. male with medical history significant of COPD on home O2 at 3 L nasal cannula, anxiety/depression, known LUL pulmonary nodule, being admitted with new onset A-flutter with RVR and COPD exacerbation after initially arriving as a code STEMI going emergently to the Cath Lab with cath revealing nonobstructive disease.  During cath he developed SVT with ectopy treated with adenosine  then developed rapid A-flutter that was treated with a diltiazem  bolus subsequently being placed on an infusion.  His LVEDP was 30 and he was also treated with Lasix .  Per cardiologist, Dr. Anner his coronary showed modest disease that was not amenable to stent placement.  He was started on heparin .  Patient was transferred from the Cath Lab to stepdown in stable position.  He was found to be wheezing and is being treated with DuoNebs and Solu-Medrol  as well. Hospitalist consulted for ongoing management   Review of Systems  Respiratory:  Positive for shortness of breath and wheezing.   Cardiovascular:  Positive for chest pain.  All other systems reviewed and are negative.   Past Medical History:  Diagnosis Date   Asthma    COPD (chronic obstructive pulmonary disease) (HCC)    Depression    GERD (gastroesophageal reflux disease)    Vision loss, left eye    Past Surgical History:  Procedure Laterality Date   EYE SURGERY  2009, about [id number]   steel in left eye on the job, legally blind    Social History:  reports that he quit smoking about 2 years ago. His smoking use included cigarettes. He started smoking about 51 years ago. He has a 24.5 pack-year smoking history. He has  never used smokeless tobacco. He reports current alcohol  use of about 2.0 standard drinks of alcohol  per week. He reports that he does not use drugs.  Allergies  Allergen Reactions   Aspirin     Family History  Problem Relation Age of Onset   Diabetes Mother    Hypertension Mother    Stroke Mother    Asthma Father    Diabetes Father    Diabetes Sister    Hypertension Sister    Diabetes Brother    Hypertension Brother     Prior to Admission medications   Medication Sig Start Date End Date Taking? Authorizing Provider  acetaminophen  (TYLENOL ) 325 MG tablet Take 2 tablets (650 mg total) by mouth every 6 (six) hours as needed for mild pain, fever or headache. 07/05/23   Danton Reyes DASEN, MD  albuterol  (PROVENTIL ) (2.5 MG/3ML) 0.083% nebulizer solution Take 2.5 mg by nebulization every 4 (four) hours as needed for wheezing. 06/06/20   [provider]  albuterol  (VENTOLIN  HFA) 108 (90 Base) MCG/ACT inhaler Inhale 2 puffs into the lungs every 6 (six) hours as needed for wheezing or shortness of breath. 11/30/19   Edelmiro, [place], MD  amLODipine  (NORVASC ) 10 MG tablet Take 10 mg by mouth daily.    [provider]  fluticasone  furoate-vilanterol (BREO ELLIPTA ) 200-25 MCG/ACT AEPB Inhale 1 puff into the lungs daily.    [provider]  montelukast  (SINGULAIR ) 10 MG tablet Take 1 tablet (10 mg total) by mouth  daily. Patient taking differently: Take 10 mg by mouth at bedtime. 02/22/21 11/13/23  Maree Hue, MD  pantoprazole  (PROTONIX ) 20 MG tablet Take 1 tablet (20 mg total) by mouth daily. 11/24/20 11/13/23  Amery, Sahar, MD  polyethylene glycol (MIRALAX  / GLYCOLAX ) 17 g packet Take 17 g by mouth daily. 11/20/23   Agbata, Tochukwu, MD  senna-docusate (SENOKOT-S) 8.6-50 MG tablet Take 1 tablet by mouth 2 (two) times daily. 11/20/23   Agbata, Tochukwu, MD  SIMBRINZA  1-0.2 % SUSP Place 1 drop into the left eye in the morning and at bedtime. 07/05/23   Danton Reyes DASEN, MD   tamsulosin  (FLOMAX ) 0.4 MG CAPS capsule Take 1 capsule (0.4 mg total) by mouth daily. 07/05/23   Danton Reyes DASEN, MD  theophylline  (UNIPHYL) 400 MG 24 hr tablet Take 1 tablet (400 mg total) by mouth at bedtime. 05/13/21   Josette Ade, MD    Physical Exam: Vitals:   07/28/24 2157 07/28/24 2202 07/28/24 2207 07/28/24 2212  BP: (!) 168/89 (!) 168/89    Pulse: (!) 112 (!) 0 (!) 0 (!) 0  Resp: 19     Temp:      TempSrc:      SpO2: (!) 89% 94%    Weight:      Height:       Physical Exam Vitals and nursing note reviewed.  Constitutional:      General: He is not in acute distress. HENT:     Head: Normocephalic and atraumatic.  Cardiovascular:     Rate and Rhythm: Regular rhythm. Tachycardia present.     Heart sounds: Normal heart sounds.  Pulmonary:     Effort: Pulmonary effort is normal.     Breath sounds: Normal breath sounds.  Abdominal:     Palpations: Abdomen is soft.     Tenderness: There is no abdominal tenderness.  Neurological:     Mental Status: Mental status is at baseline.     Data Reviewed: Relevant notes from primary care and specialist visits, past discharge summaries as available in EHR, including Care Everywhere. 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 No stenting performed Chest pain likely multifactorial related to tachyarrhythmia, COPD exacerbation possibly anginal Risk factor modification recommended by cardiology Antiplatelets and statins  Atrial flutter with rapid ventricular response, new onset(HCC) Continue diltiazem  infusion Continue heparin   infusion Continue orders per cardiology [facility] cardiology consulted for continued management  COPD with acute exacerbation (HCC) Chronic hypoxic respiratory failure Scheduled and as needed DuoNebs IV steroids Patient was started on azithromycin   Anxiety and depression Continue home meds pending med rec  Essential hypertension Currently on diltiazem  Will hold home amlodipine  Hydralazine  as needed for additional BP control  BPH (benign prostatic hyperplasia) Continue tamsulosin     DVT prophylaxis: On full dose heparin    Advance Care Planning:   Code Status: Full Code   Consults: [facility] cardiology  Family Communication:   Severity of Illness: The appropriate patient status for this patient is INPATIENT. Inpatient status is judged to be reasonable and necessary in order to provide the required intensity of service to ensure the patient's safety. The patient's presenting symptoms, physical exam findings, and initial radiographic  and laboratory data in the context of their chronic comorbidities is felt to place them at high risk for further clinical deterioration. Furthermore, it is not anticipated that the patient will be medically stable for discharge from the hospital within 2 midnights of admission.   * I certify that at the point of admission it is my clinical judgment that the patient will require inpatient hospital care spanning beyond 2 midnights from the point of admission due to high intensity of service, high risk for further deterioration and high frequency of surveillance required.*  Author: Delayne LULLA Solian, MD 07/28/2024 10:49 PM  For on call review [email or web address].

## 2024-07-28 NOTE — Consult Note (Addendum)
 Cardiology Consultation   Patient ID: Bryan Kelley MRN: [id number]; DOB: 02/27/1956  Admit date: 07/28/2024 Date of Consult: 07/28/2024  PCP:  [facility]   [facility] Providers Cardiologist:  None        Patient Profile: Bryan Kelley is a 68 y.o. male with a hx of COPD on home oxygen  3 L along with GERD and hypertension who is being seen 07/28/2024 for the evaluation of EKG concerning for inferior STEMI at the request of [facility].  History of Present Illness: [name] is a 68 year old gentleman with longstanding COPD and hypertension who came in via EMS as a possible inferior STEMI. He stated that on the evening of 07/27/2024 roughly 9 PM, he began having some chest discomfort and worsening dyspnea. Chest pain was most noted with inspiration. It was relatively minimal at the time, however, at roughly 8 PM on 07/28/2024, he was sitting on the couch watching television and had sudden onset acute dyspnea and worsening chest pain. His chest hurts significantly with inspiration. EMS was called and he was found to have concerning EKG findings in the inferior leads concave into 3 and aVF of roughly 2 mm. Code STEMI was called. On ER evaluation, the EKG looked a little bit more concerning and smoother. There did appear to be at least 2 mm ST elevations in II, 3 and aVF. Difficult to assess his rhythm but he was somewhat tachycardic. Bedside echocardiogram while waiting on the Cath Lab team indicated hyperdynamic function with no obvious wall motion abnormality although windows were difficult. With the abnormal EKG and ongoing symptoms I chose to take him to the Cath Lab for emergent catheterization with possible PCI.  He had not really done much over the last 24 hours and therefore did not necessarily notice any exacerbation of symptoms with exertion, he actually was not feeling that bad until this evening.  With his current episode he has had some nausea but no emesis.   Mostly his symptoms are anxiety.  He does not necessarily feel his heart beating fast or irregular.  No PND, orthopnea or edema.  Baseline dyspnea but now significantly exacerbated.  Because of his dyspnea he was placed on BiPAP that was continued during the initial portion of the Cath Lab procedure.  Cardiac cath revealed nonobstructive CAD.  Patient became tachycardic with what appeared to be A-fib RVR after giving IV adenosine .  Loaded with IV diltiazem  with plans to start diltiazem  drip.   Past Medical History:  Diagnosis Date   Asthma    COPD (chronic obstructive pulmonary disease) (HCC)    Depression    GERD (gastroesophageal reflux disease)    Vision loss, left eye     Past Surgical History:  Procedure Laterality Date   EYE SURGERY  2009, about 8011,8010   steel in left eye on the job, legally blind      Home Medications:  Prior to Admission medications   Medication Sig Start Date End Date Taking? Authorizing Provider  acetaminophen  (TYLENOL ) 325 MG tablet Take 2 tablets (650 mg total) by mouth every 6 (six) hours as needed for mild pain, fever or headache. 07/05/23   Danton Reyes DASEN, MD  albuterol  (PROVENTIL ) (2.5 MG/3ML) 0.083% nebulizer solution Take 2.5 mg by nebulization every 4 (four) hours as needed for wheezing. 06/06/20   [provider]  albuterol  (VENTOLIN  HFA) 108 (90 Base) MCG/ACT inhaler Inhale 2 puffs into the lungs every 6 (six) hours as needed for wheezing or  shortness of breath. 11/30/19   Edelmiro, [place], MD  amLODipine  (NORVASC ) 10 MG tablet Take 10 mg by mouth daily.    [provider]  fluticasone  furoate-vilanterol (BREO ELLIPTA ) 200-25 MCG/ACT AEPB Inhale 1 puff into the lungs daily.    [provider]  montelukast  (SINGULAIR ) 10 MG tablet Take 1 tablet (10 mg total) by mouth daily. Patient taking differently: Take 10 mg by mouth at bedtime. 02/22/21 11/13/23  Maree Hue, MD  pantoprazole  (PROTONIX ) 20 MG tablet Take 1 tablet  (20 mg total) by mouth daily. 11/24/20 11/13/23  Amery, Sahar, MD  polyethylene glycol (MIRALAX  / GLYCOLAX ) 17 g packet Take 17 g by mouth daily. 11/20/23   Agbata, Tochukwu, MD  senna-docusate (SENOKOT-S) 8.6-50 MG tablet Take 1 tablet by mouth 2 (two) times daily. 11/20/23   Agbata, Aimee, MD  SIMBRINZA  1-0.2 % SUSP Place 1 drop into the left eye in the morning and at bedtime. 07/05/23   Danton Reyes DASEN, MD  tamsulosin  (FLOMAX ) 0.4 MG CAPS capsule Take 1 capsule (0.4 mg total) by mouth daily. 07/05/23   Danton Reyes DASEN, MD  theophylline  (UNIPHYL) 400 MG 24 hr tablet Take 1 tablet (400 mg total) by mouth at bedtime. 05/13/21   Josette Ade, MD    Scheduled Meds:  Continuous Infusions:  PRN Meds:   Allergies:    Allergies  Allergen Reactions   Aspirin     Social History:   Social History   Socioeconomic History   Marital status: Legally Separated    Spouse name: Not on file   Number of children: Not on file   Years of education: Not on file   Highest education level: Not on file  Occupational History   Not on file  Tobacco Use   Smoking status: Former    Current packs/day: 0.00    Average packs/day: 0.5 packs/day for 49.0 years (24.5 ttl pk-yrs)    Types: Cigarettes    Start date: 03/1973    Quit date: 03/2022    Years since quitting: 2.4   Smokeless tobacco: Never  Vaping Use   Vaping status: Never Used  Substance and Sexual Activity   Alcohol  use: Yes    Alcohol /week: 2.0 standard drinks of alcohol     Types: 2 Cans of beer per week   Drug use: No   Sexual activity: Not on file  Other Topics Concern   Not on file  Social History Narrative   Not on file   Social Drivers of Health   Financial Resource Strain: Not on file  Food Insecurity: No Food Insecurity (11/15/2023)   Hunger Vital Sign    Worried About Running Out of Food in the Last Year: Never true    Ran Out of Food in the Last Year: Never true  Transportation Needs: No Transportation Needs  (11/15/2023)   PRAPARE - Administrator, Civil Service (Medical): No    Lack of Transportation (Non-Medical): No  Physical Activity: Not on file  Stress: Not on file  Social Connections: Patient Declined (11/15/2023)   Social Connection and Isolation Panel    Frequency of Communication with Friends and Family: Patient declined    Frequency of Social Gatherings with Friends and Family: Patient declined    Attends Religious Services: Patient declined    Active Member of Clubs or Organizations: Patient declined    Attends Banker Meetings: Patient declined    Marital Status: Patient declined  Intimate Partner Violence: Not At Risk (11/15/2023)  Humiliation, Afraid, Rape, and Kick questionnaire    Fear of Current or Ex-Partner: No    Emotionally Abused: No    Physically Abused: No    Sexually Abused: No    Family History:    Family History  Problem Relation Age of Onset   Diabetes Mother    Hypertension Mother    Stroke Mother    Asthma Father    Diabetes Father    Diabetes Sister    Hypertension Sister    Diabetes Brother    Hypertension Brother      ROS:  Please see the history of present illness.  Dyspnea, chest pain some nausea.  Chest pain worse with inspiration. All other ROS reviewed and negative.     Physical Exam/Data: Vitals:   07/28/24 2052 07/28/24 2054 07/28/24 2058  BP: (!) 131/103  130/74  Pulse: 99  (!) 105  Resp: 20  15  Temp: 98.3 F (36.8 C)    TempSrc: Oral    SpO2: 100%  100%  Weight:  80 kg   Height:  5' 10 (1.778 m)    No intake or output data in the 24 hours ending 07/28/24 2112    07/28/2024    8:54 PM 07/04/2023   12:03 AM 08/20/2022    3:00 PM  Last 3 Weights  Weight (lbs) 176 lb 5.9 oz 170 lb 180 lb  Weight (kg) 80 kg 77.111 kg 81.647 kg     Body mass index is 25.31 kg/m.  General:  Well nourished, well developed, in moderate distress-on BiPAP during initial exam. HEENT: Left eye is blind somewhat glazed  over.  Right eye with extraocular muscle intact. Neck: No bruit.  Difficult to assess for JVD with BiPAP in place and lying flat Vascular: No carotid bruits; Distal pulses 2+ bilaterally Cardiac: Tachycardic with normal S1 and S2.  Distant heart sounds.  No obvious murmurs rubs or gallops. Lungs: Mid air movement with diffuse x-ray wheezing but no obvious rales. Abd: soft, nontender, no hepatomegaly  Ext: no edema Musculoskeletal:  No deformities, BUE and BLE strength normal and equal Skin: warm and dry  Neuro:  CNs 2-12 intact, no focal abnormalities noted Psych:  Normal affect   EKG:  The EKG was personally reviewed and demonstrates: Sinus tachycardia rate roughly 105.  ~2 mm ST ovation's in II, 3 and aVF (along with ~2 mm elevations in V4 through V6.  ST depressions noted in aVL only.  Otherwise no clear reciprocal changes. Telemetry:  Telemetry was personally reviewed and demonstrates: Initially tachycardic, however in the Cath Lab he became profoundly tachycardic with rates ranging between 145 and 155 suggesting possible SVT.  Did not break with vagal maneuvers. =>, IV adenosine  confirmed presence of fib/flutter  Relevant CV Studies: Echocardiogram July 2022: EF 60 to 65%.  No RWMA.  Essentially normal.  Laboratory Data: High Sensitivity Troponin:  No results for input(s): TROPONINIHS in the last 720 hours.   ChemistryNo results for input(s): NA, K, CL, CO2, GLUCOSE, BUN, CREATININE, CALCIUM , MG, GFRNONAA, GFRAA, ANIONGAP in the last 168 hours.  No results for input(s): PROT, ALBUMIN, AST, ALT, ALKPHOS, BILITOT in the last 168 hours. Lipids No results for input(s): CHOL, TRIG, HDL, LABVLDL, LDLCALC, CHOLHDL in the last 168 hours.  HematologyNo results for input(s): WBC, RBC, HGB, HCT, MCV, MCH, MCHC, RDW, PLT in the last 168 hours. Thyroid No results for input(s): TSH, FREET4 in the last 168 hours.  BNPNo results  for input(s): BNP, PROBNP in the  last 168 hours.  DDimer No results for input(s): DDIMER in the last 168 hours.  Radiology/Studies:  No results found.   Assessment and Plan: Inferior STEMI: Patient was taken emergently cardiac catheterization lab where angiography did not reveal any occlusive CAD.  Most notable lesion was a small/diminutive D2 with 90% stenosis but no occlusive disease.  Diagnosis of STEMI excluded No need for aspirin or Plavix/Thienopyridine. TR band removal per protocol Check 2D echocardiogram in the morning-LV gram suggested hyperdynamic function.  Would need to exclude potential for pericarditis given inferior and lateral ST elevations. Cycle troponin levels With existing CAD, will add high-dose statin Stop amlodipine  and with plans to potentially use diltiazem -diltiazem  likely preferable over beta-blocker given COPD. COPD exacerbation-will defer to TRH team with PCCM assistance A-fib RVR noted in Cath Lab-initially appear to be SVT with rate roughly 150.  With 6 mg of IV adenosine , no change, however after 12 mg IV adenosine , there was flutter and then fib waves noted.  He was given 10 mg IV diltiazem  with notable improvement in rate into the 110s.  Plan will be to give an additional 10 of diltiazem  and start a drip. Will write to start IV heparin  2 hours or TR band removal => will defer to consulting service to determine if the patient should be on longstanding DOAC.   ADDENDUM -- TALKED WITH PT IN ICU - NOTES THAT CP MAY BE WORSE LYING DOWN -- with diffuse ST E on EKG this if concerning for Pericarditis  -- WILL HOLD OFF ON STARTING IV HEPARIN  UNTIL ECHO TO AVOID POTENTIAL HEMORRHAGIC CONVERSION.  Risk Assessment/Risk Scores:    TIMI Risk Score for ST  Elevation MI:   The patient's TIMI risk score is 6, which indicates a 16.1% risk of all cause mortality at 30 days.   New York  Heart Association (NYHA) Functional Class NYHA Class III   This patients  CHA2DS2-VASc Score and unadjusted Ischemic Stroke Rate (% per year) is equal to 4.8 % stroke rate/year from a score of 4  Above score calculated as 1 point each if present [CHF, HTN, DM, Vascular=MI/PAD/Aortic Plaque, Age if 65-74, or Male] Above score calculated as 2 points each if present [Age > 75, or Stroke/TIA/TE]     For questions or updates, please contact [facility] Please consult [email or web address] for contact info under      Signed, Alm Clay, MD  07/28/2024 9:12 PM

## 2024-07-28 NOTE — ED Notes (Signed)
Pt transported to Cath Lab at this time.  

## 2024-07-28 NOTE — Assessment & Plan Note (Addendum)
 Continue diltiazem  orally

## 2024-07-28 NOTE — Assessment & Plan Note (Addendum)
 Nonobstructive CAD on cath 07/28/2024 No stenting performed Chest pain likely multifactorial related to tachyarrhythmia, COPD exacerbation. Patient with persistent ST elevation.  Cardiology to follow-up as outpatient

## 2024-07-28 NOTE — Assessment & Plan Note (Signed)
 Continue tamsulosin.

## 2024-07-28 NOTE — ED Provider Notes (Signed)
 [facility] Provider Note    Event Date/Time   First MD Initiated Contact with Patient 07/28/24 2102     (approximate)   History   Chest Pain and Shortness of Breath   HPI  Bryan Kelley is a 68 year old male presenting to the emergency department for evaluation of chest pain and shortness of breath.  Reports that at 8 PM yesterday had onset of chest pain with associated shortness of breath.  Has waxed and waned since that time.  Shortly prior to presentation had worsening chest pain.  With EMS, patient was found to be hypoxic in the 80s, placed on supplemental oxygen  but remained labored so he was transition to CPAP.  Received 2 DuoNebs, 125 Solu-Medrol  for wheezing.     Physical Exam   Triage Vital Signs: ED Triage Vitals  Encounter Vitals Group     BP 07/28/24 2052 (!) 131/103     Girls Systolic BP Percentile --      Girls Diastolic BP Percentile --      Boys Systolic BP Percentile --      Boys Diastolic BP Percentile --      Pulse Rate 07/28/24 2052 99     Resp 07/28/24 2052 20     Temp 07/28/24 2052 98.3 F (36.8 C)     Temp Source 07/28/24 2052 Oral     SpO2 07/28/24 2052 100 %     Weight 07/28/24 2054 176 lb 5.9 oz (80 kg)     Height 07/28/24 2054 5' 10 (1.778 m)     Head Circumference --      Peak Flow --      Pain Score 07/28/24 2054 7     Pain Loc --      Pain Education --      Exclude from Growth Chart --     Most recent vital signs: Vitals:   07/28/24 2315 07/28/24 2330  BP: (!) 120/102 (!) 130/92  Pulse: (!) 115 (!) 123  Resp: (!) 21 19  Temp:    SpO2: 94% 96%     General: Awake, interactive  CV:  Tachycardic, good peripheral perfusion Resp:  Mildly labored respirations, diffuse expiratory wheezing noted Abd:  Nondistended.  Neuro:  Symmetric facial movement, fluid speech   ED Results / Procedures / Treatments   Labs (all labs ordered are listed, but only abnormal results are displayed) Labs Reviewed  GLUCOSE,  CAPILLARY - Abnormal; Notable for the following components:      Result Value   Glucose-Capillary 126 (*)    All other components within normal limits  MRSA NEXT GEN BY PCR, NASAL  LIPOPROTEIN A (LPA)  CBC  LIPID PANEL  HIV ANTIBODY (ROUTINE TESTING W REFLEX)  CBC WITH DIFFERENTIAL/PLATELET  COMPREHENSIVE METABOLIC PANEL WITH GFR  POCT I-STAT CREATININE  TROPONIN I (HIGH SENSITIVITY)     EKG EKG independently reviewed and interpreted by myself demonstrates:  EKG demonstrated sinus tachycardia at a rate of 101, PR 157, QRS 80, QTc 423, ST elevation noted in inferior leads with subtle depressions in the lateral leads concerning for inferior STEMI  When compared to prior EKGs, ST segment elevation is new.  RADIOLOGY Imaging independently reviewed and interpreted by myself demonstrates:    PROCEDURES:  Critical Care performed: Yes, see critical care procedure note(s)  CRITICAL CARE Performed by: Nilsa Dade   Total critical care time: 20 minutes  Critical care time was exclusive of separately billable procedures and treating other patients.  Critical care was necessary to treat or prevent imminent or life-threatening deterioration.  Critical care was time spent personally by me on the following activities: development of treatment plan with patient and/or surrogate as well as nursing, discussions with consultants, evaluation of patient's response to treatment, examination of patient, obtaining history from patient or surrogate, ordering and performing treatments and interventions, ordering and review of laboratory studies, ordering and review of radiographic studies, pulse oximetry and re-evaluation of patient's condition.   Procedures   MEDICATIONS ORDERED IN ED: Medications  albuterol  (PROVENTIL ) (2.5 MG/3ML) 0.083% nebulizer solution 2.5 mg (has no administration in time range)  theophylline  (UNIPHYL) 400 MG 24 hr tablet 400 mg (400 mg Oral Given 07/28/24 2349)   tamsulosin  (FLOMAX ) capsule 0.4 mg (has no administration in time range)  fluticasone  furoate-vilanterol (BREO ELLIPTA ) 200-25 MCG/ACT 1 puff (has no administration in time range)  polyethylene glycol (MIRALAX  / GLYCOLAX ) packet 17 g (has no administration in time range)  senna-docusate (Senokot-S) tablet 1 tablet (1 tablet Oral Given 07/28/24 2314)  brimonidine  (ALPHAGAN ) 0.2 % ophthalmic solution 1 drop (has no administration in time range)  montelukast  (SINGULAIR ) tablet 10 mg (10 mg Oral Given 07/28/24 2314)  pantoprazole  (PROTONIX ) EC tablet 20 mg (has no administration in time range)  labetalol  (NORMODYNE ) injection 10 mg (has no administration in time range)  hydrALAZINE  (APRESOLINE ) injection 10 mg (has no administration in time range)  ondansetron  (ZOFRAN ) injection 4 mg (has no administration in time range)  sodium chloride  flush (NS) 0.9 % injection 3 mL (3 mLs Intravenous Given 07/28/24 2323)  sodium chloride  flush (NS) 0.9 % injection 3 mL (has no administration in time range)  0.9 %  sodium chloride  infusion (has no administration in time range)  diltiazem  (CARDIZEM ) 1 mg/mL load via infusion 10 mg (10 mg Intravenous Bolus from Bag 07/28/24 2353)    And  diltiazem  (CARDIZEM ) 125 mg in dextrose  5% 125 mL (1 mg/mL) infusion (5 mg/hr Intravenous New Bag/Given 07/28/24 2353)  morphine  (PF) 2 MG/ML injection 2 mg (has no administration in time range)  atorvastatin  (LIPITOR ) tablet 80 mg (has no administration in time range)  losartan  (COZAAR ) tablet 50 mg (has no administration in time range)  acetaminophen  (TYLENOL ) tablet 650 mg (has no administration in time range)    Or  acetaminophen  (TYLENOL ) suppository 650 mg (has no administration in time range)  methylPREDNISolone  sodium succinate (SOLU-MEDROL ) 40 mg/mL injection 40 mg (40 mg Intravenous Given 07/28/24 2314)    Followed by  predniSONE  (DELTASONE ) tablet 40 mg (has no administration in time range)  ipratropium-albuterol   (DUONEB) 0.5-2.5 (3) MG/3ML nebulizer solution 3 mL (has no administration in time range)  albuterol  (PROVENTIL ) (2.5 MG/3ML) 0.083% nebulizer solution 2.5 mg (has no administration in time range)  HYDROcodone -acetaminophen  (NORCO/VICODIN) 5-325 MG per tablet 1-2 tablet (has no administration in time range)  free water  250 mL (250 mLs Oral Given 07/28/24 2252)     IMPRESSION / MDM / ASSESSMENT AND PLAN / ED COURSE  I reviewed the triage vital signs and the nursing notes.  Differential diagnosis includes, but is not limited to, STEMI, COPD exacerbation, arrhythmia  Patient's presentation is most consistent with acute presentation with potential threat to life or bodily function.  68 year old male presenting with chest pain and shortness of breath.  Prehospital EKG did demonstrate ST elevations in the inferior leads.  I was contacted by EMS regarding his EKG.  They did note that patient had active chest pain so a code STEMI was activated.  On presentation here, patient arrives on CPAP, does have ongoing wheezing, but respirations not significantly labored.  Patient was evaluated by Dr. Anner with cardiology team.  EKG here persistently demonstrates inferior ST elevations.  Patient was taken to the Cath Lab on BiPAP for further management.    FINAL CLINICAL IMPRESSION(S) / ED DIAGNOSES   Final diagnoses:  COPD exacerbation (HCC)  Inferior ST segment elevation     Rx / DC Orders   ED Discharge Orders          Ordered    AMB referral to Phase II Cardiac Rehabilitation       Comments: Only if truly ruled in for MI   07/28/24 2216             Note:  This document was prepared using Dragon voice recognition software and may include unintentional dictation errors.   Levander Slate, MD 07/29/24 0000

## 2024-07-28 NOTE — Progress Notes (Addendum)
 Asked by on call cardiology/STEMI team to evaluate patient for ICU admission. Patient presented with main complaint of chest pain and dyspnea requiring BIPAP support but alert and responsive. Upon arrival to ED CODE STEMI activated due to inferior STE. Cardiac cath unrevealing with non occlusive CAD. During cath the patient developed ectopy and flipped into fib/flutter requiring diltiazem  and adenosine .  Upon bedside assessment, patient is A&O- non-toxic appearing- without dyspnea on nasal cannula. Vitals are stable, not on vasopressor support. As respiratory status and hemodynamics have improved, patient does not meet ICU admission criteria by PCCM team at this time.  From a pulmonary stand point:  - would recommend scheduled duo-nebs, solu medrol  as well as azithromycin  and work up for possible infectious process PRN  Please re-consult if needed in the future.       Jenita Ruth Rust-Chester, AGACNP-BC Acute Care Nurse Practitioner [facility]    [phone] / [phone] Please see Amion for pager details.

## 2024-07-28 NOTE — Assessment & Plan Note (Signed)
Continue home meds pending med rec 

## 2024-07-28 NOTE — Assessment & Plan Note (Addendum)
 Patient in normal sinus rhythm.  Started on oral Cardizem  and switched off Cardizem  drip on 9/26.  Heparin  drip changed over to Eliquis  on 9/26.  Risk of bleeding explained with the Eliquis .  Had pharmacist give patient 30-day discount card.

## 2024-07-28 NOTE — Assessment & Plan Note (Addendum)
 Switch back to Solu-Medrol  with wheezing.  Continue nebulizer treatment

## 2024-07-29 ENCOUNTER — Encounter: Payer: Self-pay | Admitting: Cardiology

## 2024-07-29 ENCOUNTER — Inpatient Hospital Stay (HOSPITAL_COMMUNITY)
Admit: 2024-07-29 | Discharge: 2024-07-29 | Disposition: A | Discharge status: Home / Self Care | Attending: Cardiology | Admitting: Cardiology

## 2024-07-29 ENCOUNTER — Telehealth (HOSPITAL_COMMUNITY): Payer: Self-pay | Admitting: Pharmacy Technician

## 2024-07-29 ENCOUNTER — Other Ambulatory Visit (HOSPITAL_COMMUNITY): Payer: Self-pay

## 2024-07-29 DIAGNOSIS — E0590 Thyrotoxicosis, unspecified without thyrotoxic crisis or storm: Secondary | ICD-10-CM

## 2024-07-29 DIAGNOSIS — I10 Essential (primary) hypertension: Secondary | ICD-10-CM

## 2024-07-29 DIAGNOSIS — I4891 Unspecified atrial fibrillation: Secondary | ICD-10-CM

## 2024-07-29 DIAGNOSIS — I509 Heart failure, unspecified: Secondary | ICD-10-CM

## 2024-07-29 DIAGNOSIS — J9611 Chronic respiratory failure with hypoxia: Secondary | ICD-10-CM | POA: Diagnosis not present

## 2024-07-29 DIAGNOSIS — J441 Chronic obstructive pulmonary disease with (acute) exacerbation: Secondary | ICD-10-CM | POA: Diagnosis not present

## 2024-07-29 DIAGNOSIS — I4892 Unspecified atrial flutter: Secondary | ICD-10-CM | POA: Diagnosis not present

## 2024-07-29 DIAGNOSIS — R079 Chest pain, unspecified: Secondary | ICD-10-CM | POA: Diagnosis not present

## 2024-07-29 DIAGNOSIS — I2510 Atherosclerotic heart disease of native coronary artery without angina pectoris: Secondary | ICD-10-CM | POA: Diagnosis not present

## 2024-07-29 DIAGNOSIS — J9621 Acute and chronic respiratory failure with hypoxia: Secondary | ICD-10-CM | POA: Diagnosis not present

## 2024-07-29 DIAGNOSIS — K5900 Constipation, unspecified: Secondary | ICD-10-CM | POA: Insufficient documentation

## 2024-07-29 DIAGNOSIS — K5909 Other constipation: Secondary | ICD-10-CM

## 2024-07-29 DIAGNOSIS — N400 Enlarged prostate without lower urinary tract symptoms: Secondary | ICD-10-CM

## 2024-07-29 LAB — CBC WITH DIFFERENTIAL/PLATELET
Abs Immature Granulocytes: 0.01 K/uL (ref 0.00–0.07)
Basophils Absolute: 0 K/uL (ref 0.0–0.1)
Basophils Relative: 0 %
Eosinophils Absolute: 0 K/uL (ref 0.0–0.5)
Eosinophils Relative: 0 %
HCT: 39.8 % (ref 39.0–52.0)
Hemoglobin: 13.2 g/dL (ref 13.0–17.0)
Immature Granulocytes: 0 %
Lymphocytes Relative: 8 %
Lymphs Abs: 0.7 K/uL (ref 0.7–4.0)
MCH: 28.9 pg (ref 26.0–34.0)
MCHC: 33.2 g/dL (ref 30.0–36.0)
MCV: 87.3 fL (ref 80.0–100.0)
Monocytes Absolute: 0.2 K/uL (ref 0.1–1.0)
Monocytes Relative: 2 %
Neutro Abs: 8.1 K/uL — ABNORMAL HIGH (ref 1.7–7.7)
Neutrophils Relative %: 90 %
Platelets: 226 K/uL (ref 150–400)
RBC: 4.56 MIL/uL (ref 4.22–5.81)
RDW: 13.1 % (ref 11.5–15.5)
Smear Review: NORMAL
WBC: 8.9 K/uL (ref 4.0–10.5)
nRBC: 0 % (ref 0.0–0.2)

## 2024-07-29 LAB — GLUCOSE, CAPILLARY
Glucose-Capillary: 238 mg/dL — ABNORMAL HIGH (ref 70–99)
Glucose-Capillary: 241 mg/dL — ABNORMAL HIGH (ref 70–99)
Glucose-Capillary: 204 mg/dL — ABNORMAL HIGH (ref 70–99)

## 2024-07-29 LAB — COMPREHENSIVE METABOLIC PANEL WITH GFR
ALT: 21 U/L (ref 0–44)
AST: 23 U/L (ref 15–41)
Albumin: 3.8 g/dL (ref 3.5–5.0)
Alkaline Phosphatase: 67 U/L (ref 38–126)
Anion gap: 12 (ref 5–15)
BUN: 14 mg/dL (ref 8–23)
CO2: 31 mmol/L (ref 22–32)
Calcium: 8.9 mg/dL (ref 8.9–10.3)
Chloride: 91 mmol/L — ABNORMAL LOW (ref 98–111)
Creatinine, Ser: 0.94 mg/dL (ref 0.61–1.24)
GFR, Estimated: >60 mL/min (ref >60)
Glucose, Bld: 264 mg/dL — ABNORMAL HIGH (ref 70–99)
Potassium: 3.9 mmol/L (ref 3.5–5.1)
Sodium: 134 mmol/L — ABNORMAL LOW (ref 135–145)
Total Bilirubin: 1.7 mg/dL — ABNORMAL HIGH (ref 0.0–1.2)
Total Protein: 7.1 g/dL (ref 6.5–8.1)

## 2024-07-29 LAB — HIV ANTIBODY (ROUTINE TESTING W REFLEX): HIV Screen 4th Generation wRfx: NONREACTIVE

## 2024-07-29 LAB — CBC
HCT: 41.4 % (ref 39.0–52.0)
Hemoglobin: 13.4 g/dL (ref 13.0–17.0)
MCH: 28.7 pg (ref 26.0–34.0)
MCHC: 32.4 g/dL (ref 30.0–36.0)
MCV: 88.7 fL (ref 80.0–100.0)
Platelets: 224 K/uL (ref 150–400)
RBC: 4.67 MIL/uL (ref 4.22–5.81)
RDW: 13.2 % (ref 11.5–15.5)
WBC: 8.9 K/uL (ref 4.0–10.5)
nRBC: 0 % (ref 0.0–0.2)

## 2024-07-29 LAB — MRSA NEXT GEN BY PCR, NASAL: MRSA by PCR Next Gen: NOT DETECTED

## 2024-07-29 LAB — ECHOCARDIOGRAM COMPLETE
AR max vel: 3.72 cm2
AV Area VTI: 3.71 cm2
AV Area mean vel: 3.66 cm2
AV Mean grad: 4 mmHg
AV Peak grad: 7.4 mmHg
Ao pk vel: 1.36 m/s
Area-P 1/2: 3.24 cm2
Calc EF: 78.2 %
Height: 70 in
MV VTI: 4.82 cm2
Single Plane A2C EF: 76.5 %
Single Plane A4C EF: 81.1 %
Weight: 2821.89 [oz_av]

## 2024-07-29 LAB — LIPID PANEL
Cholesterol: 135 mg/dL (ref 0–200)
HDL: 50 mg/dL (ref >40)
LDL Cholesterol: 78 mg/dL (ref 0–99)
Total CHOL/HDL Ratio: 2.7 ratio
Triglycerides: 34 mg/dL (ref <150)
VLDL: 7 mg/dL (ref 0–40)

## 2024-07-29 LAB — POCT I-STAT 7, (LYTES, BLD GAS, ICA,H+H)
Acid-Base Excess: 4 mmol/L — ABNORMAL HIGH (ref 0.0–2.0)
Bicarbonate: 32.2 mmol/L — ABNORMAL HIGH (ref 20.0–28.0)
Calcium, Ion: 1.22 mmol/L (ref 1.15–1.40)
HCT: 40 % (ref 39.0–52.0)
Hemoglobin: 13.6 g/dL (ref 13.0–17.0)
O2 Saturation: 100 %
Potassium: 4.1 mmol/L (ref 3.5–5.1)
Sodium: 135 mmol/L (ref 135–145)
TCO2: 34 mmol/L — ABNORMAL HIGH (ref 22–32)
pCO2 arterial: 62 mmHg — ABNORMAL HIGH (ref 32–48)
pH, Arterial: 7.323 — ABNORMAL LOW (ref 7.35–7.45)
pO2, Arterial: 285 mmHg — ABNORMAL HIGH (ref 83–108)

## 2024-07-29 LAB — CG4 I-STAT (LACTIC ACID): Lactic Acid, Venous: 0.5 mmol/L (ref 0.5–1.9)

## 2024-07-29 LAB — TROPONIN I (HIGH SENSITIVITY)
Troponin I (High Sensitivity): 19 ng/L — ABNORMAL HIGH (ref <18)
Troponin I (High Sensitivity): 15 ng/L (ref <18)

## 2024-07-29 LAB — HEMOGLOBIN A1C
Hgb A1c MFr Bld: 5.9 % — ABNORMAL HIGH (ref 4.8–5.6)
Mean Plasma Glucose: 122.63 mg/dL

## 2024-07-29 LAB — T4, FREE: Free T4: 0.88 ng/dL (ref 0.61–1.12)

## 2024-07-29 LAB — TSH: TSH: <0.1 u[IU]/mL — ABNORMAL LOW (ref 0.350–4.500)

## 2024-07-29 MED ORDER — ORAL CARE MOUTH RINSE: 15.0000 mL | OROMUCOSAL | Status: DC | PRN

## 2024-07-29 MED ORDER — IPRATROPIUM-ALBUTEROL 0.5-2.5 (3) MG/3ML IN SOLN
3.0000 mL | Freq: Two times a day (BID) | RESPIRATORY_TRACT | Status: DC
  Administered 2024-07-29 – 2024-08-02 (×8): 3 mL via RESPIRATORY_TRACT
  Filled 2024-07-29 (×8): qty 3

## 2024-07-29 MED ORDER — LACTULOSE 10 GM/15ML PO SOLN
30.0000 g | Freq: Two times a day (BID) | ORAL | Status: DC
  Administered 2024-07-29 – 2024-07-30 (×2): 30 g via ORAL
  Filled 2024-07-29 (×2): qty 60

## 2024-07-29 MED ORDER — POLYVINYL ALCOHOL 1.4 % OP SOLN
1.0000 [drp] | OPHTHALMIC | Status: DC | PRN
  Administered 2024-07-29: 1 [drp] via OPHTHALMIC
  Filled 2024-07-29: qty 15

## 2024-07-29 MED ORDER — BRINZOLAMIDE 1 % OP SUSP
1.0000 [drp] | Freq: Three times a day (TID) | OPHTHALMIC | Status: DC
  Administered 2024-07-29 – 2024-08-01 (×12): 1 [drp] via OPHTHALMIC
  Filled 2024-07-29: qty 10

## 2024-07-29 MED ORDER — CHLORHEXIDINE GLUCONATE CLOTH 2 % EX PADS
6.0000 | MEDICATED_PAD | Freq: Every day | CUTANEOUS | Status: DC
Start: 2024-07-29 — End: 2024-08-02
  Administered 2024-07-29 – 2024-07-31 (×2): 6 via TOPICAL

## 2024-07-29 MED ORDER — INSULIN ASPART 100 UNIT/ML IJ SOLN
0.0000 [IU] | Freq: Every day | INTRAMUSCULAR | Status: DC
  Administered 2024-07-29 – 2024-08-01 (×2): 2 [IU] via SUBCUTANEOUS
  Filled 2024-07-29 (×2): qty 1

## 2024-07-29 MED ORDER — DILTIAZEM HCL 30 MG PO TABS
30.0000 mg | ORAL_TABLET | Freq: Four times a day (QID) | ORAL | Status: DC
  Administered 2024-07-29 – 2024-07-30 (×4): 30 mg via ORAL
  Filled 2024-07-29 (×4): qty 1

## 2024-07-29 MED ORDER — APIXABAN 5 MG PO TABS
5.0000 mg | ORAL_TABLET | Freq: Two times a day (BID) | ORAL | Status: DC
  Administered 2024-07-29 – 2024-08-02 (×9): 5 mg via ORAL
  Filled 2024-07-29 (×9): qty 1

## 2024-07-29 MED ORDER — INSULIN ASPART 100 UNIT/ML IJ SOLN
0.0000 [IU] | Freq: Three times a day (TID) | INTRAMUSCULAR | Status: DC
  Administered 2024-07-29: 2 [IU] via SUBCUTANEOUS
  Administered 2024-07-30: 1 [IU] via SUBCUTANEOUS
  Administered 2024-07-30: 3 [IU] via SUBCUTANEOUS
  Administered 2024-07-30: 2 [IU] via SUBCUTANEOUS
  Administered 2024-07-31: 1 [IU] via SUBCUTANEOUS
  Administered 2024-07-31 – 2024-08-01 (×3): 2 [IU] via SUBCUTANEOUS
  Administered 2024-08-01 – 2024-08-02 (×2): 1 [IU] via SUBCUTANEOUS
  Filled 2024-07-29 (×10): qty 1

## 2024-07-29 MED ORDER — BRIMONIDINE TARTRATE 0.2 % OP SOLN
1.0000 [drp] | Freq: Three times a day (TID) | OPHTHALMIC | Status: DC
  Administered 2024-07-29 – 2024-08-01 (×12): 1 [drp] via OPHTHALMIC
  Filled 2024-07-29: qty 5

## 2024-07-29 MED ORDER — HEPARIN (PORCINE) 25000 UT/250ML-% IV SOLN
1250.0000 [IU]/h | INTRAVENOUS | Status: DC
  Administered 2024-07-29: 1250 [IU]/h via INTRAVENOUS
  Filled 2024-07-29: qty 250

## 2024-07-29 MED ORDER — LACTULOSE 10 GM/15ML PO SOLN
30.0000 g | Freq: Once | ORAL | Status: AC
  Administered 2024-07-29: 30 g via ORAL
  Filled 2024-07-29: qty 60

## 2024-07-29 MED ORDER — PERFLUTREN LIPID MICROSPHERE
1.0000 mL | INTRAVENOUS | Status: AC | PRN
Start: 2024-07-29 — End: 2024-07-29
  Administered 2024-07-29: 2 mL via INTRAVENOUS

## 2024-07-29 MED ORDER — HEPARIN BOLUS VIA INFUSION
4800.0000 [IU] | Freq: Once | INTRAVENOUS | Status: AC
  Administered 2024-07-29: 4800 [IU] via INTRAVENOUS
  Filled 2024-07-29: qty 4800

## 2024-07-29 NOTE — Hospital Course (Addendum)
 68 y.o. male with medical history significant of COPD on home O2 at 3 L nasal cannula, anxiety/depression, known LUL pulmonary nodule, being admitted with new onset A-flutter with RVR and COPD exacerbation after initially arriving as a code STEMI going emergently to the Cath Lab with cath revealing nonobstructive disease.  During cath he developed SVT with ectopy treated with adenosine  then developed rapid A-flutter that was treated with a diltiazem  bolus subsequently being placed on an infusion.  His LVEDP was 30 and he was also treated with Lasix .  Per cardiologist, Dr. Anner his coronary showed modest disease that was not amenable to stent placement.  He was started on heparin .  Patient was transferred from the Cath Lab to stepdown in stable position.  He was found to be wheezing and is being treated with DuoNebs and Solu-Medrol  as well. Hospitalist consulted for ongoing management   9/26.  Patient in normal sinus rhythm.  Cardiology to start oral Cardizem  to try to taper off Cardizem  drip.  Will switch heparin  drip over to Eliquis .  Will give lactulose  for constipation. 9/27.  Patient short of breath this morning after washing up.  Prednisone  switched over to Solu-Medrol .  Still seeing ST elevations on monitor.

## 2024-07-29 NOTE — Plan of Care (Signed)
  Problem: Education: Goal: Knowledge of General Education information will improve Description: Including pain rating scale, medication(s)/side effects and non-pharmacologic comfort measures Outcome: Progressing   Problem: Health Behavior/Discharge Planning: Goal: Ability to manage health-related needs will improve Outcome: Progressing   Problem: Clinical Measurements: Goal: Ability to maintain clinical measurements within normal limits will improve Outcome: Progressing Goal: Will remain free from infection Outcome: Progressing Goal: Diagnostic test results will improve Outcome: Progressing Goal: Respiratory complications will improve Outcome: Progressing Goal: Cardiovascular complication will be avoided Outcome: Progressing   Problem: Activity: Goal: Risk for activity intolerance will decrease Outcome: Progressing   Problem: Nutrition: Goal: Adequate nutrition will be maintained Outcome: Progressing   Problem: Coping: Goal: Level of anxiety will decrease Outcome: Progressing   Problem: Elimination: Goal: Will not experience complications related to bowel motility Outcome: Progressing Goal: Will not experience complications related to urinary retention Outcome: Progressing   Problem: Pain Managment: Goal: General experience of comfort will improve and/or be controlled Outcome: Progressing   Problem: Safety: Goal: Ability to remain free from injury will improve Outcome: Progressing   Problem: Skin Integrity: Goal: Risk for impaired skin integrity will decrease Outcome: Progressing   Problem: Education: Goal: Understanding of CV disease, CV risk reduction, and recovery process will improve Outcome: Progressing Goal: Individualized Educational Video(s) Outcome: Progressing   Problem: Activity: Goal: Ability to return to baseline activity level will improve Outcome: Progressing   Problem: Cardiovascular: Goal: Ability to achieve and maintain adequate  cardiovascular perfusion will improve Outcome: Progressing Goal: Vascular access site(s) Level 0-1 will be maintained Outcome: Progressing   Problem: Health Behavior/Discharge Planning: Goal: Ability to safely manage health-related needs after discharge will improve Outcome: Progressing   Problem: Education: Goal: Knowledge of disease or condition will improve Outcome: Progressing Goal: Knowledge of the prescribed therapeutic regimen will improve Outcome: Progressing Goal: Individualized Educational Video(s) Outcome: Progressing   Problem: Activity: Goal: Ability to tolerate increased activity will improve Outcome: Progressing Goal: Will verbalize the importance of balancing activity with adequate rest periods Outcome: Progressing   Problem: Respiratory: Goal: Ability to maintain a clear airway will improve Outcome: Progressing Goal: Levels of oxygenation will improve Outcome: Progressing Goal: Ability to maintain adequate ventilation will improve Outcome: Progressing   Problem: Education: Goal: Ability to describe self-care measures that may prevent or decrease complications (Diabetes Survival Skills Education) will improve Outcome: Progressing Goal: Individualized Educational Video(s) Outcome: Progressing   Problem: Fluid Volume: Goal: Ability to maintain a balanced intake and output will improve Outcome: Progressing   Problem: Health Behavior/Discharge Planning: Goal: Ability to identify and utilize available resources and services will improve Outcome: Progressing Goal: Ability to manage health-related needs will improve Outcome: Progressing   Problem: Metabolic: Goal: Ability to maintain appropriate glucose levels will improve Outcome: Progressing   Problem: Nutritional: Goal: Maintenance of adequate nutrition will improve Outcome: Progressing Goal: Progress toward achieving an optimal weight will improve Outcome: Progressing   Problem: Skin  Integrity: Goal: Risk for impaired skin integrity will decrease Outcome: Progressing   Problem: Tissue Perfusion: Goal: Adequacy of tissue perfusion will improve Outcome: Progressing

## 2024-07-29 NOTE — Progress Notes (Signed)
   07/29/24 0730  Spiritual Encounters  Type of Visit Initial  Care provided to: Patient  Reason for visit Code  OnCall Visit Yes   Chaplain visited patient after seeing a Code Stemi came in last night for him.  Chaplain offered a warm presence and invited patient to share whatever he may need.  Patient said his partner is having a procedure and wanted to lift her in prayer and his own healing as well.  Chaplain prayed with patient and let him know [employer] is available.  Rev. Rana M. Nicholaus, M.Div. Chaplain Resident [facility]

## 2024-07-29 NOTE — Assessment & Plan Note (Addendum)
 X-ray on 9/27 showed stool throughout the colon with gaseous distention.  Finally had a bowel movement with mag citrate on 9/28.

## 2024-07-29 NOTE — Progress Notes (Signed)
 Rounding Note   Patient Name: Bryan Kelley Date of Encounter: 07/29/2024  [facility] Cardiologist: None   Subjective He continues to complain of sharp pleuritic chest pain.  He was in atrial fibrillation this morning when I saw him with controlled ventricular rate but subsequently converted to sinus rhythm.  He continues to have expiratory wheezing.  Scheduled Meds:  apixaban   5 mg Oral BID   atorvastatin   80 mg Oral Daily   brimonidine   1 drop Left Eye TID   brinzolamide   1 drop Left Eye TID   Chlorhexidine  Gluconate Cloth  6 each Topical Daily   diltiazem   30 mg Oral Q6H   insulin  aspart  0-5 Units Subcutaneous QHS   insulin  aspart  0-6 Units Subcutaneous TID WC   ipratropium-albuterol   3 mL Nebulization BID   lactulose   30 g Oral BID   losartan   50 mg Oral Daily   montelukast   10 mg Oral QHS   pantoprazole   20 mg Oral Daily   polyethylene glycol  17 g Oral Daily   [START ON 07/30/2024] predniSONE   40 mg Oral Q breakfast   senna-docusate  1 tablet Oral BID   sodium chloride  flush  3 mL Intravenous Q12H   tamsulosin   0.4 mg Oral Daily   theophylline   400 mg Oral QHS   Continuous Infusions:  sodium chloride      diltiazem  (CARDIZEM ) infusion Stopped (07/29/24 1409)   PRN Meds: sodium chloride , acetaminophen  **OR** acetaminophen , albuterol , albuterol , artificial tears, HYDROcodone -acetaminophen , morphine  injection, ondansetron  (ZOFRAN ) IV, mouth rinse, sodium chloride  flush   Vital Signs  Vitals:   07/29/24 1245 07/29/24 1300 07/29/24 1315 07/29/24 1451  BP: (!) 89/72 120/69  125/73  Pulse: 86 79 (!) 56 89  Resp: 14     Temp:    98 F (36.7 C)  TempSrc:    Oral  SpO2: 94% 94% 99% 92%  Weight:      Height:        Intake/Output Summary (Last 24 hours) at 07/29/2024 1515 Last data filed at 07/29/2024 1401 Gross per 24 hour  Intake 725.56 ml  Output 1500 ml  Net -774.44 ml      07/28/2024    8:54 PM 07/04/2023   12:03 AM 08/20/2022    3:00 PM  Last  3 Weights  Weight (lbs) 176 lb 5.9 oz 170 lb 180 lb  Weight (kg) 80 kg 77.111 kg 81.647 kg      Telemetry Atrial fibrillation converted to sinus rhythm- Personally Reviewed  ECG   - Personally Reviewed  Physical Exam  GEN: No acute distress.   Neck: No JVD Cardiac: RRR, no murmurs, rubs, or gallops.  Respiratory: Bilateral expiratory wheezing GI: Soft, nontender, non-distended  MS: No edema; No deformity. Neuro:  Nonfocal  Psych: Normal affect   Labs High Sensitivity Troponin:   Recent Labs  Lab 07/28/24 2252 07/29/24 0133 07/29/24 0426  TROPONINIHS 16 19* 15     Chemistry Recent Labs  Lab 07/28/24 2128 07/29/24 0014  NA 135 134*  K 4.1 3.9  CL  --  91*  CO2  --  31  GLUCOSE  --  264*  BUN  --  14  CREATININE 0.80 0.94  CALCIUM   --  8.9  PROT  --  7.1  ALBUMIN  --  3.8  AST  --  23  ALT  --  21  ALKPHOS  --  67  BILITOT  --  1.7*  GFRNONAA  --  >  60  ANIONGAP  --  12    Lipids  Recent Labs  Lab 07/29/24 0133  CHOL 135  TRIG 34  HDL 50  LDLCALC 78  CHOLHDL 2.7    Hematology Recent Labs  Lab 07/28/24 2128 07/29/24 0014 07/29/24 0133  WBC  --  8.9 8.9  RBC  --  4.56 4.67  HGB 13.6 13.2 13.4  HCT 40.0 39.8 41.4  MCV  --  87.3 88.7  MCH  --  28.9 28.7  MCHC  --  33.2 32.4  RDW  --  13.1 13.2  PLT  --  226 224   Thyroid  Recent Labs  Lab 07/29/24 0426  TSH <0.100*  FREET4 0.88    BNPNo results for input(s): BNP, PROBNP in the last 168 hours.  DDimer No results for input(s): DDIMER in the last 168 hours.   Radiology  ECHOCARDIOGRAM COMPLETE Result Date: 07/29/2024    ECHOCARDIOGRAM REPORT   Patient Name:   Bryan Kelley Date of Exam: 07/29/2024 Medical Rec #:  [id number]     Height:       70.0 in Accession #:    [id number]    Weight:       176.4 lb Date of Birth:  Nov 17, 1955     BSA:          1.979 m Patient Age:    68 years      BP:           112/69 mmHg Patient Gender: M             HR:           83 bpm. Exam Location:  [facility]  Procedure: 2D Echo, Cardiac Doppler, Color Doppler and Intracardiac            Opacification Agent (Both Spectral and Color Flow Doppler were            utilized during procedure). Indications:     Atrial Fibrillation I48.91                  Congestive Heart Failure I50.9  History:         Patient has prior history of Echocardiogram examinations, most                  recent 05/10/2021. Acute MI; Arrythmias:Atrial Fibrillation.  Sonographer:     Rosina Dunk Referring Phys:  [id number] DAVID W HARDING Diagnosing Phys: Redell Cave MD IMPRESSIONS  1. Left ventricular ejection fraction, by estimation, is 65 to 70%. The left ventricle has normal function. The left ventricle has no regional wall motion abnormalities. Left ventricular diastolic parameters are consistent with Grade I diastolic dysfunction (impaired relaxation).  2. Right ventricular systolic function is normal. The right ventricular size is normal.  3. The mitral valve is normal in structure. No evidence of mitral valve regurgitation.  4. The aortic valve was not well visualized. Aortic valve regurgitation is not visualized.  5. The inferior vena cava is normal in size with greater than 50% respiratory variability, suggesting right atrial pressure of 3 mmHg. FINDINGS  Left Ventricle: Left ventricular ejection fraction, by estimation, is 65 to 70%. The left ventricle has normal function. The left ventricle has no regional wall motion abnormalities. Definity  contrast agent was given IV to delineate the left ventricular  endocardial borders. The left ventricular internal cavity size was normal in size. There is no left ventricular hypertrophy. Left ventricular diastolic parameters are consistent with Grade I diastolic  dysfunction (impaired relaxation). Right Ventricle: The right ventricular size is normal. No increase in right ventricular wall thickness. Right ventricular systolic function is normal. Left Atrium: Left atrial size was normal in size.  Right Atrium: Right atrial size was normal in size. Pericardium: There is no evidence of pericardial effusion. Mitral Valve: The mitral valve is normal in structure. No evidence of mitral valve regurgitation. MV peak gradient, 2.3 mmHg. The mean mitral valve gradient is 1.0 mmHg. Tricuspid Valve: The tricuspid valve is grossly normal. Tricuspid valve regurgitation is not demonstrated. Aortic Valve: The aortic valve was not well visualized. Aortic valve regurgitation is not visualized. Aortic valve mean gradient measures 4.0 mmHg. Aortic valve peak gradient measures 7.4 mmHg. Aortic valve area, by VTI measures 3.71 cm. Pulmonic Valve: The pulmonic valve was not well visualized. Pulmonic valve regurgitation is not visualized. Aorta: The aortic root is normal in size and structure. Venous: The inferior vena cava is normal in size with greater than 50% respiratory variability, suggesting right atrial pressure of 3 mmHg. IAS/Shunts: No atrial level shunt detected by color flow Doppler.  LEFT VENTRICLE PLAX 2D LVIDd:         4.20 cm     Diastology LV PW:         1.00 cm     LV e' medial:    8.92 cm/s LV IVS:        0.90 cm     LV E/e' medial:  6.5 LVOT diam:     2.20 cm     LV e' lateral:   9.25 cm/s LV SV:         104         LV E/e' lateral: 6.2 LV SV Index:   53 LVOT Area:     3.80 cm  LV Volumes (MOD) LV vol d, MOD A2C: 60.0 ml LV vol d, MOD A4C: 77.6 ml LV vol s, MOD A2C: 14.1 ml LV vol s, MOD A4C: 14.7 ml LV SV MOD A2C:     45.9 ml LV SV MOD A4C:     77.6 ml LV SV MOD BP:      54.2 ml RIGHT VENTRICLE RV Basal diam:  4.20 cm RV Mid diam:    2.80 cm RV S prime:     12.10 cm/s TAPSE (M-mode): 2.1 cm LEFT ATRIUM           Index        RIGHT ATRIUM           Index LA Vol (A2C): 34.4 ml 17.39 ml/m  RA Area:     14.00 cm                                    RA Volume:   37.80 ml  19.10 ml/m  AORTIC VALVE                    PULMONIC VALVE AV Area (Vmax):    3.72 cm     PV Vmax:        0.93 m/s AV Area (Vmean):   3.66  cm     PV Vmean:       60.500 cm/s AV Area (VTI):     3.71 cm     PV VTI:         0.178 m AV Vmax:           136.00  cm/s  PV Peak grad:   3.4 mmHg AV Vmean:          94.600 cm/s  PV Mean grad:   2.0 mmHg AV VTI:            0.281 m      RVOT Peak grad: 2 mmHg AV Peak Grad:      7.4 mmHg AV Mean Grad:      4.0 mmHg LVOT Vmax:         133.00 cm/s LVOT Vmean:        91.200 cm/s LVOT VTI:          0.274 m LVOT/AV VTI ratio: 0.98  AORTA Ao Root diam: 3.30 cm MITRAL VALVE MV Area (PHT): 3.24 cm    SHUNTS MV Area VTI:   4.82 cm    Systemic VTI:  0.27 m MV Peak grad:  2.3 mmHg    Systemic Diam: 2.20 cm MV Mean grad:  1.0 mmHg    Pulmonic VTI:  0.113 m MV Vmax:       0.75 m/s MV Vmean:      48.2 cm/s MV Decel Time: 234 msec MV E velocity: 57.65 cm/s MV A velocity: 74.70 cm/s MV E/A ratio:  0.77 Redell Cave MD Electronically signed by Redell Cave MD Signature Date/Time: 07/29/2024/12:45:15 PM    Final    CARDIAC CATHETERIZATION Addendum Date: 07/28/2024   Mid LAD lesion is 50% stenosed with 90% stenosed side branch in 2nd Diag.   Ost RCA to Prox RCA lesion is 30% stenosed. Prox RCA lesion is 30% stenosed. Prox RCA to Mid RCA lesion is 25% stenosed.   There is hyperdynamic left ventricular systolic function.  The left ventricular ejection fraction is greater than 65% by visual estimate. LV end diastolic pressure is severely elevated.   A-fib/flutter with RVR developed upon completion of procedure => flutter/fib waves noted with IV adenosine  12 mg => rate improved with 10 mg IV diltiazem  Dominance: Right  FINDINGS Diffuse moderate CAD: The most significant lesion being 90% stenosis of a tiny second diagonal branch associated with segmental eccentric 50% calcified LAD stenosis. Not flow-limiting. LCx has 2 major OM branches and a small AV groove branch with this with small PL-no notable disease. RCA has tandem 30% proximal lesions and a 20% proximal to mid lesion with TIMI-3 flow down relatively small PDA with  trivial posterolateral branches. Hyperdynamic LV significant ectopy with catheter insertion. EF estimated over 70%, and severely elevated LVEDP of 30 millimercury. Tachyarrhythmia originally suspected to be SVT but with 12 mg IV adenosine , this confirmed the presence of flutter/fib waves. He was then given 10 mg IV diltiazem  with improved heart rate into the 110s.  => Likely A-fib RVR Suspect AECOPD exacerbation exacerbated by tachyarrhythmia.  This could explain his chest pain.  He had notable chest pain when tachycardic that improved with rate control. RECOMMENDATIONS Admit to ICU under TRH service-[name], MD with [facility] and PCCM consultation Check 2D echocardiogram the morning and cycle troponin levels   He will be started on diltiazem  drip after 10 mg IV bolus-titrate for rate control and then convert to p.o. I have stopped his home amlodipine  and started losartan  50 mg daily; would not start aspirin given the plans for DOAC. Reassess volume status in the morning, but he has already had 400 mL out after 40 mg IV Lasix  PCCM to assist TRH with management of a AECOPD Started high-dose statin based on diffuse CAD.   Recommend  to resume Rivaroxaban, at currently prescribed dose and frequency.   Patient will be started on IV heparin  starting 2 hours after TR band removal and depending on timing/duration of his arrhythmia, could consider DOAC on discharge.  I think for ease of use, Xarelto may be a better option. Alm MICAEL Clay, MD, MS Alm Clay, M.D., M.S. Interventional Cardiologist [facility]  [address]; [address] [place], [place]  [address] [phone]           Fax [phone]   Result Date: 07/28/2024 Table formatting from the original result was not included. Images from the original result were not included.   Mid LAD lesion is 50% stenosed with 90% stenosed side branch in 2nd Diag.   Ost RCA to Prox RCA lesion is 30% stenosed. Prox RCA  lesion is 30% stenosed. Prox RCA to Mid RCA lesion is 25% stenosed.   There is hyperdynamic left ventricular systolic function.  The left ventricular ejection fraction is greater than 65% by visual estimate. LV end diastolic pressure is severely elevated.   A-fib/flutter with RVR developed upon completion of procedure => flutter/fib waves noted with IV adenosine  12 mg => rate improved with 10 mg IV diltiazem  Dominance: Right  FINDINGS Diffuse moderate CAD: The most significant lesion being 90% stenosis of a tiny second diagonal branch associated with segmental eccentric 50% calcified LAD stenosis. Not flow-limiting. LCx has 2 major OM branches and a small AV groove branch with this with small PL-no notable disease. RCA has tandem 30% proximal lesions and a 20% proximal to mid lesion with TIMI-3 flow down relatively small PDA with trivial posterolateral branches. Hyperdynamic LV significant ectopy with catheter insertion. EF estimated over 70%, and severely elevated LVEDP of 30 millimercury. Tachyarrhythmia originally suspected to be SVT but with 12 mg IV adenosine , this confirmed the presence of flutter/fib waves. He was then given 10 mg IV diltiazem  with improved heart rate into the 110s.  => Likely A-fib RVR Suspect AECOPD exacerbation exacerbated by tachyarrhythmia.  This could explain his chest pain.  He had notable chest pain when tachycardic that improved with rate control. RECOMMENDATIONS Admit to ICU under TRH service-[name], MD with [facility] and PCCM consultation Check 2D echocardiogram the morning and cycle troponin levels   He will be started on diltiazem  drip after 10 mg IV bolus-titrate for rate control and then convert to p.o. I have stopped his home amlodipine  and started losartan  50 mg daily; would not start aspirin given the plans for DOAC. Reassess volume status in the morning, but he has already had 400 mL out after 40 mg IV Lasix  PCCM to assist TRH with management of a AECOPD  Started high-dose statin based on diffuse CAD.   Recommend to resume Rivaroxaban, at currently prescribed dose and frequency.   Patient will be started on IV heparin  starting 2 hours after TR band removal and depending on timing/duration of his arrhythmia, could consider DOAC on discharge.  I think for ease of use, Xarelto may be a better option. Alm MICAEL Clay, MD, MS Alm Clay, M.D., M.S. Interventional Cardiologist [facility]  [address]; [address] [place], [place]  [address] [phone]           Fax [phone]    Cardiac Studies Echocardiogram done today showed normal LV systolic function  Patient Profile   68 y.o. male with history of COPD on home oxygen  who was seen by  Dr. Anner yesterday for suspected inferior STEMI  Assessment & Plan  1.  Abnormal EKG suggestive of inferior STEMI.  However, emergent cardiac catheterization was done and showed mild nonobstructive coronary artery disease.  Recommend medical therapy.  2.  Atrial fibrillation with RVR: I added oral diltiazem  this morning and diltiazem  drip was subsequently weaned. The patient's chads Vascor is at least 3.  Recommend long-term anticoagulation.  He is currently on heparin  drip and can be switched to Eliquis  from our standpoint.  3.  COPD exacerbation: Still wheezing.    For questions or updates, please contact [facility] Please consult [email or web address] for contact info under      Signed, Deatrice Cage, MD  07/29/2024, 3:15 PM

## 2024-07-29 NOTE — Progress Notes (Signed)
 PHARMACY - ANTICOAGULATION CONSULT NOTE  Pharmacy Consult for Eliquis   Indication: atrial fibrillation  Patient Measurements: Height: 5' 10 (177.8 cm) Weight: 80 kg (176 lb 5.9 oz) IBW/kg (Calculated) : 73 HEPARIN  DW (KG): 80  Labs: Recent Labs    07/28/24 2128 07/28/24 2252 07/29/24 0014 07/29/24 0133 07/29/24 0426  HGB 13.6  --  13.2 13.4  --   HCT 40.0  --  39.8 41.4  --   PLT  --   --  226 224  --   CREATININE 0.80  --  0.94  --   --   TROPONINIHS  --  16  --  19* 15    Estimated Creatinine Clearance: 78.7 mL/min (by C-G formula based on SCr of 0.94 mg/dL).   Medical History: Past Medical History:  Diagnosis Date   Asthma    COPD (chronic obstructive pulmonary disease) (HCC)    Depression    GERD (gastroesophageal reflux disease)    Vision loss, left eye    Assessment: Pharmacy consulted to dose Eliquis  in this 68 year old male admitted with new onset Aflutter. No prior anticoag noted.   Plan:  --Stop IV heparin  --Start apixaban  5 mg BID --CBC per protocol while inpatient. Monitor for s/sx of bleeding  Bryan Kelley 07/29/2024,12:03 PM

## 2024-07-29 NOTE — Assessment & Plan Note (Addendum)
 Looks like patient chronically wears 4 L.  Initially had respiratory distress requiring BiPAP.

## 2024-07-29 NOTE — Progress Notes (Signed)
 PHARMACY - ANTICOAGULATION CONSULT NOTE  Pharmacy Consult for Heparin   Indication: atrial fibrillation and post Cath   Allergies  Allergen Reactions   Aspirin     Patient Measurements: Height: 5' 10 (177.8 cm) Weight: 80 kg (176 lb 5.9 oz) IBW/kg (Calculated) : 73 HEPARIN  DW (KG): 80  Vital Signs: Temp: 98.5 F (36.9 C) (09/26 0000) Temp Source: Oral (09/26 0000) BP: 99/73 (09/26 0100) Pulse Rate: 87 (09/26 0100)  Labs: Recent Labs    07/28/24 2128 07/28/24 2252 07/29/24 0014  HGB  --   --  13.2  HCT  --   --  39.8  PLT  --   --  226  CREATININE 0.80  --  0.94  TROPONINIHS  --  16  --     Estimated Creatinine Clearance: 78.7 mL/min (by C-G formula based on SCr of 0.94 mg/dL).   Medical History: Past Medical History:  Diagnosis Date   Asthma    COPD (chronic obstructive pulmonary disease) (HCC)    Depression    GERD (gastroesophageal reflux disease)    Vision loss, left eye     Medications:  Medications Prior to Admission  Medication Sig Dispense Refill Last Dose/Taking   acetaminophen  (TYLENOL ) 325 MG tablet Take 2 tablets (650 mg total) by mouth every 6 (six) hours as needed for mild pain, fever or headache.      albuterol  (PROVENTIL ) (2.5 MG/3ML) 0.083% nebulizer solution Take 2.5 mg by nebulization every 4 (four) hours as needed for wheezing.      albuterol  (VENTOLIN  HFA) 108 (90 Base) MCG/ACT inhaler Inhale 2 puffs into the lungs every 6 (six) hours as needed for wheezing or shortness of breath. 8 g 1    amLODipine  (NORVASC ) 10 MG tablet Take 10 mg by mouth daily.      fluticasone  furoate-vilanterol (BREO ELLIPTA ) 200-25 MCG/ACT AEPB Inhale 1 puff into the lungs daily.      montelukast  (SINGULAIR ) 10 MG tablet Take 1 tablet (10 mg total) by mouth daily. (Patient taking differently: Take 10 mg by mouth at bedtime.) 30 tablet 0    pantoprazole  (PROTONIX ) 20 MG tablet Take 1 tablet (20 mg total) by mouth daily. 30 tablet 0    polyethylene glycol (MIRALAX  /  GLYCOLAX ) 17 g packet Take 17 g by mouth daily. 14 each 0    senna-docusate (SENOKOT-S) 8.6-50 MG tablet Take 1 tablet by mouth 2 (two) times daily.      SIMBRINZA  1-0.2 % SUSP Place 1 drop into the left eye in the morning and at bedtime. 8 mL 0    tamsulosin  (FLOMAX ) 0.4 MG CAPS capsule Take 1 capsule (0.4 mg total) by mouth daily. 30 capsule 0    theophylline  (UNIPHYL) 400 MG 24 hr tablet Take 1 tablet (400 mg total) by mouth at bedtime. 30 tablet 0     Assessment: Pharmacy consulted to dose heparin  in this 68 year old male admitted with possible STEMI, now post cath but with (new onset ?) Afib.   No prior anticoag noted. CrCl = 78.7 ml/min  - Cardiology requests heparin  start 8 hrs post sheath removal.  Per RN, sheath removed on 9/25 @ 2320.  Will time heparin  to begin on 9/26 @ ~ 0730.    Goal of Therapy:  Heparin  level 0.3-0.7 units/ml Monitor platelets by anticoagulation protocol: Yes   Plan:  - Heparin  4800 units IV X 1 bolus and drip to start at 1250 units/hr on 9/26 @ ~ 0720 (8 hrs post sheath removal) -  Check HL 6 hrs after start of drip - CBC daily   Lanelle Lindo D 07/29/2024,1:18 AM

## 2024-07-29 NOTE — Progress Notes (Signed)
 This patient has been transferred to room 252 in 2A. Report was given to Medford DASEN, RN. The patient's sister has been notified of the transfer. The patient is on 2 liters of oxygen  and on tele monitoring.

## 2024-07-29 NOTE — Progress Notes (Signed)
 Progress Note   Patient: Bryan Kelley FMW:[id number] DOB: [birth year] DOA: 07/28/2024     1 DOS: the patient was seen and examined on 07/29/2024   Brief hospital course: 68 y.o. male with medical history significant of COPD on home O2 at 3 L nasal cannula, anxiety/depression, known LUL pulmonary nodule, being admitted with new onset A-flutter with RVR and COPD exacerbation after initially arriving as a code STEMI going emergently to the Cath Lab with cath revealing nonobstructive disease.  During cath he developed SVT with ectopy treated with adenosine  then developed rapid A-flutter that was treated with a diltiazem  bolus subsequently being placed on an infusion.  His LVEDP was 30 and he was also treated with Lasix .  Per cardiologist, Dr. Anner his coronary showed modest disease that was not amenable to stent placement.  He was started on heparin .  Patient was transferred from the Cath Lab to stepdown in stable position.  He was found to be wheezing and is being treated with DuoNebs and Solu-Medrol  as well. Hospitalist consulted for ongoing management   9/26.  Patient in normal sinus rhythm.  Cardiology to start oral Cardizem  to try to taper off Cardizem  drip.  Will switch heparin  drip over to Eliquis .  Will give lactulose  for constipation.  Assessment and Plan: * Atrial flutter with rapid ventricular response, new onset(HCC) Patient in normal sinus rhythm.  Started on oral Cardizem  and switching off Cardizem  drip today.  Heparin  drip changed over to Eliquis .  Transfer out of ICU.  Chest Pain S/P  emergent cardiac catheterization 07/28/2024 --STEMI ruled out Nonobstructive CAD on cath 07/28/2024 No stenting performed Chest pain likely multifactorial related to tachyarrhythmia, COPD exacerbation.  COPD with acute exacerbation (HCC) Patient initially on Solu-Medrol  will be switched over to prednisone .  Continue nebulizers.  Essential hypertension Switching diltiazem  drip over to p.o.  diltiazem .  Acute on chronic hypoxic respiratory failure (HCC) Looks like patient chronically wears 3 L.  Initially had respiratory distress requiring BiPAP.  BPH (benign prostatic hyperplasia) Continue tamsulosin   Constipation Will give lactulose  until bowel movement  Subclinical hyperthyroidism TSH very low but no normal T4.  Recommend checking TFTs in 6 weeks.        Subjective: Patient stated he has some abdominal pain and also some left-sided chest pain especially when turning.  Has been constipated.  States he needs something stronger.  He usually takes Ex-Lax at home.  Physical Exam: Vitals:   07/29/24 1230 07/29/24 1245 07/29/24 1300 07/29/24 1315  BP: (!) 90/52 (!) 89/72 120/69   Pulse: 88 86 79 (!) 56  Resp: 16 14    Temp:      TempSrc:      SpO2: 93% 94% 94% 99%  Weight:      Height:       Physical Exam HENT:     Head: Normocephalic.  Eyes:     General: Lids are normal.     Conjunctiva/sclera: Conjunctivae normal.  Cardiovascular:     Rate and Rhythm: Normal rate and regular rhythm.     Heart sounds: Normal heart sounds, S1 normal and S2 normal.  Pulmonary:     Breath sounds: Examination of the right-middle field reveals decreased breath sounds and wheezing. Examination of the left-middle field reveals decreased breath sounds and wheezing. Examination of the right-lower field reveals decreased breath sounds and wheezing. Examination of the left-lower field reveals decreased breath sounds and wheezing. Decreased breath sounds and wheezing present. No rhonchi or rales.  Abdominal:  Palpations: Abdomen is soft.     Tenderness: There is no abdominal tenderness.  Musculoskeletal:     Right lower leg: No swelling.     Left lower leg: No swelling.  Skin:    General: Skin is warm.     Findings: No rash.  Neurological:     Mental Status: He is alert and oriented to person, place, and time.     Data Reviewed: LDL 78, CBC normal range, sodium 134,  creatinine 0.94, TSH less than 0.1, free T4.88  Family Communication: Declined  Disposition: Status is: Inpatient Remains inpatient appropriate because: Transfer to progressive care  Planned Discharge Destination: Home    Time spent: 30 minutes  Author: Charlie Patterson, MD 07/29/2024 2:20 PM  For on call review [email or web address].

## 2024-07-29 NOTE — Progress Notes (Addendum)
 1400 PM: Diltiazem  drip has been stopped at this time as the pt's heart rate has been stable ranging in the 70's to 80's.    1210 PM:  MD and cardiac service have been notified that the patient reports he develops a sharp pain on his abdomen and chest when he turns to his left side. Diltiazem  drip is currently being titrated down. The patient was initiated on 30mg  of Diltiazem  oral as well. PRN morphine  and PRN Norco have been given today. The patient had a bath. Currently is on 2 liters of oxygen .  Heparin  drip has been stopped and the patient has been started on Eliquis .

## 2024-07-29 NOTE — Telephone Encounter (Addendum)
 Patient Product/process development scientist completed.    The patient is insured through [employer]. Patient has Medicare and is not eligible for a copay card, but may be able to apply for patient assistance or Medicare RX Payment Plan (Patient Must reach out to their plan, if eligible for payment plan), if available.    Ran test claim for Eliquis 5 mg and the current 30 day co-pay is $4.80.  Ran test claim for Xarelto 20 mg and the current 30 day co-pay is $4.80.  This test claim was processed through [facility]- copay amounts may vary at other pharmacies due to pharmacy/plan contracts, or as the patient moves through the different stages of their insurance plan.     Roland Earl, CPHT Pharmacy Technician III Certified Patient Advocate [facility] Patient Advocate Team Direct Number: [phone]  Fax: [phone]

## 2024-07-29 NOTE — Assessment & Plan Note (Signed)
 TSH very low but no normal T4.  Recommend checking TFTs in 6 weeks.

## 2024-07-30 ENCOUNTER — Inpatient Hospital Stay

## 2024-07-30 DIAGNOSIS — R1084 Generalized abdominal pain: Secondary | ICD-10-CM | POA: Diagnosis not present

## 2024-07-30 DIAGNOSIS — K5909 Other constipation: Secondary | ICD-10-CM | POA: Diagnosis not present

## 2024-07-30 DIAGNOSIS — I2510 Atherosclerotic heart disease of native coronary artery without angina pectoris: Secondary | ICD-10-CM

## 2024-07-30 DIAGNOSIS — J441 Chronic obstructive pulmonary disease with (acute) exacerbation: Secondary | ICD-10-CM | POA: Diagnosis not present

## 2024-07-30 DIAGNOSIS — R000 Tachycardia, unspecified: Secondary | ICD-10-CM

## 2024-07-30 DIAGNOSIS — I4892 Unspecified atrial flutter: Secondary | ICD-10-CM | POA: Diagnosis not present

## 2024-07-30 DIAGNOSIS — R9431 Abnormal electrocardiogram [ECG] [EKG]: Secondary | ICD-10-CM | POA: Diagnosis not present

## 2024-07-30 DIAGNOSIS — R079 Chest pain, unspecified: Secondary | ICD-10-CM | POA: Diagnosis not present

## 2024-07-30 DIAGNOSIS — J9621 Acute and chronic respiratory failure with hypoxia: Secondary | ICD-10-CM | POA: Diagnosis not present

## 2024-07-30 HISTORY — DX: Tachycardia, unspecified: R00.0

## 2024-07-30 LAB — CBC
HCT: 35 % — ABNORMAL LOW (ref 39.0–52.0)
Hemoglobin: 11.7 g/dL — ABNORMAL LOW (ref 13.0–17.0)
MCH: 28.7 pg (ref 26.0–34.0)
MCHC: 33.4 g/dL (ref 30.0–36.0)
MCV: 86 fL (ref 80.0–100.0)
Platelets: 219 K/uL (ref 150–400)
RBC: 4.07 MIL/uL — ABNORMAL LOW (ref 4.22–5.81)
RDW: 12.9 % (ref 11.5–15.5)
WBC: 19.4 K/uL — ABNORMAL HIGH (ref 4.0–10.5)
nRBC: 0 % (ref 0.0–0.2)

## 2024-07-30 LAB — GLUCOSE, CAPILLARY
Glucose-Capillary: 275 mg/dL — ABNORMAL HIGH (ref 70–99)
Glucose-Capillary: 208 mg/dL — ABNORMAL HIGH (ref 70–99)
Glucose-Capillary: 181 mg/dL — ABNORMAL HIGH (ref 70–99)
Glucose-Capillary: 193 mg/dL — ABNORMAL HIGH (ref 70–99)

## 2024-07-30 MED ORDER — DILTIAZEM HCL 25 MG/5ML IV SOLN
10.0000 mg | INTRAVENOUS | Status: DC | PRN
Start: 2024-07-30 — End: 2024-08-02
  Administered 2024-07-30: 10 mg via INTRAVENOUS
  Filled 2024-07-30: qty 5

## 2024-07-30 MED ORDER — FLEET ENEMA RE ENEM
1.0000 | ENEMA | Freq: Once | RECTAL | Status: AC
  Administered 2024-07-30: 1 via RECTAL

## 2024-07-30 MED ORDER — METHYLPREDNISOLONE SODIUM SUCC 40 MG IJ SOLR
40.0000 mg | Freq: Every day | INTRAMUSCULAR | Status: DC
  Administered 2024-07-30 – 2024-07-31 (×2): 40 mg via INTRAVENOUS
  Filled 2024-07-30 (×2): qty 1

## 2024-07-30 MED ORDER — POLYETHYLENE GLYCOL 3350 17 G PO PACK
17.0000 g | PACK | Freq: Two times a day (BID) | ORAL | Status: DC
  Administered 2024-07-30: 17 g via ORAL
  Filled 2024-07-30 (×2): qty 1

## 2024-07-30 MED ORDER — LACTULOSE 10 GM/15ML PO SOLN
30.0000 g | Freq: Two times a day (BID) | ORAL | Status: DC
  Administered 2024-07-30: 30 g via ORAL
  Filled 2024-07-30 (×2): qty 60

## 2024-07-30 MED ORDER — POLYETHYLENE GLYCOL 3350 17 G PO PACK
34.0000 g | PACK | Freq: Every day | ORAL | Status: DC
  Administered 2024-07-30: 34 g via ORAL
  Filled 2024-07-30: qty 2

## 2024-07-30 MED ORDER — DILTIAZEM HCL 25 MG/5ML IV SOLN
15.0000 mg | Freq: Once | INTRAVENOUS | Status: AC
  Administered 2024-07-30: 15 mg via INTRAVENOUS
  Filled 2024-07-30: qty 5

## 2024-07-30 MED ORDER — DILTIAZEM HCL ER COATED BEADS 120 MG PO CP24
120.0000 mg | ORAL_CAPSULE | Freq: Every day | ORAL | Status: DC
  Administered 2024-07-30 – 2024-08-02 (×4): 120 mg via ORAL
  Filled 2024-07-30 (×4): qty 1

## 2024-07-30 MED ORDER — BISACODYL 10 MG RE SUPP
10.0000 mg | Freq: Every day | RECTAL | Status: DC | PRN
  Administered 2024-07-30: 10 mg via RECTAL
  Filled 2024-07-30: qty 1

## 2024-07-30 MED ORDER — SORBITOL 70 % SOLN
30.0000 mL | Freq: Once | Status: AC
  Administered 2024-07-30: 30 mL via ORAL
  Filled 2024-07-30: qty 30

## 2024-07-30 NOTE — Progress Notes (Addendum)
 Heart rate persistently up into the 130s.  Patient still trying to have a bowel movement.  Sinus tachycardia on monitor.  15 mg of IV Cardizem  push without much result.  Patient feeling okay.    Physical Exam Cardiovascular:     Rate and Rhythm: Regular rhythm. Tachycardia present.     Heart sounds: Normal heart sounds, S1 normal and S2 normal.  Pulmonary:     Breath sounds: Examination of the right-middle field reveals decreased breath sounds and wheezing. Examination of the left-middle field reveals decreased breath sounds and wheezing. Examination of the right-lower field reveals decreased breath sounds and wheezing. Examination of the left-lower field reveals decreased breath sounds and wheezing. Decreased breath sounds and wheezing present.  Abdominal:     General: There is distension.     Palpations: Abdomen is soft.     Tenderness: There is generalized abdominal tenderness.     Comments: Rectal exam no stool in the vault   Will still try to get patient to have a bowel movement.  Received lactulose  before.  If no bowel movement will try sorbitol. Abdominal x-ray flat and upright shows a little dilated colon with a lot of stool.  Read by me. Do not necessarily need to treat sinus tachycardia.  Dr. Charlie Patterson 15 minutes

## 2024-07-30 NOTE — Progress Notes (Signed)
 Called to see patient secondary to abdominal pain. Physical Exam Abdominal:     General: There is distension.     Palpations: Abdomen is soft.     Tenderness: There is generalized abdominal tenderness.   No bowel movement with Fleet enema.  Small bowel movement with Dulcolax suppository.  Continue MiraLAX  and lactulose .  Will start with abdominal x-ray.  Dr. Charlie Patterson 20 minutes

## 2024-07-30 NOTE — Evaluation (Addendum)
 Physical Therapy Evaluation & Discharge Patient Details Name: Bryan Kelley MRN: [id number] DOB: 20-Jan-1956 Today's Date: 07/30/2024  History of Present Illness  Pt is a 68 y/o M admitted on 07/28/24 after presenting with chest pain & SOB. Pt with new onset A-flutter with RVR & COPD exacerbation. S/p emergent cardiac cath on 07/28/24 & STEMI ruled out. PMH: COPD on home O2, anxiety, depression, LUL pulmonary nodule  Clinical Impression  Pt seen for PT evaluation with pt agreeable to tx. Pt reports prior to admission he was independent without AD, denies falls. On this date, pt is able to complete bed mobility & transfers independently, ambulate 1 lap around nurses station without AD with mod I. Pt on 4L/min via nasal cannula with SpO2 90% or >; educated pt on pursed lip breathing. At this time, pt does not require acute PT services but would benefit from ongoing mobilization while here.  HR 110 bpm with gait.      If plan is discharge home, recommend the following:     Can travel by private vehicle        Equipment Recommendations None recommended by PT  Recommendations for Other Services       Functional Status Assessment Patient has not had a recent decline in their functional status     Precautions / Restrictions Precautions Precautions: None Restrictions Weight Bearing Restrictions Per Provider Order: No      Mobility  Bed Mobility Overal bed mobility: Independent                  Transfers Overall transfer level: Independent Equipment used: None                    Ambulation/Gait Ambulation/Gait assistance: Modified independent (Device/Increase time) Gait Distance (Feet): 160 Feet Assistive device: None Gait Pattern/deviations: Step-through pattern Gait velocity: decreased     General Gait Details: no overt LOB  Stairs            Wheelchair Mobility     Tilt Bed    Modified Rankin (Stroke Patients Only)       Balance                                              Pertinent Vitals/Pain Pain Assessment Pain Assessment: No/denies pain    Home Living Family/patient expects to be discharged to:: Private residence Living Arrangements: Other relatives (sister) Available Help at Discharge: Family Type of Home: House Home Access: Stairs to enter Entrance Stairs-Rails: Right;Left;Can reach both Secretary/administrator of Steps: 4   Home Layout: One level        Prior Function Prior Level of Function : Independent/Modified Independent             Mobility Comments: ambulatory without AD, reports he doesn't go out much & only ambulates household distances ADLs Comments: independent     Extremity/Trunk Assessment   Upper Extremity Assessment Upper Extremity Assessment: Overall WFL for tasks assessed    Lower Extremity Assessment Lower Extremity Assessment: Overall WFL for tasks assessed    Cervical / Trunk Assessment Cervical / Trunk Assessment: Normal  Communication   Communication Communication: No apparent difficulties    Cognition Arousal: Alert Behavior During Therapy: WFL for tasks assessed/performed   PT - Cognitive impairments: No apparent impairments  Following commands: Intact       Cueing Cueing Techniques: Verbal cues     General Comments      Exercises     Assessment/Plan    PT Assessment Patient does not need any further PT services  PT Problem List         PT Treatment Interventions      PT Goals (Current goals can be found in the Care Plan section)  Acute Rehab PT Goals Patient Stated Goal: have a BM PT Goal Formulation: All assessment and education complete, DC therapy Time For Goal Achievement: 08/13/24 Potential to Achieve Goals: Good    Frequency       Co-evaluation               AM-PAC PT 6 Clicks Mobility  Outcome Measure Help needed turning from your back to your side while in a flat bed  without using bedrails?: None Help needed moving from lying on your back to sitting on the side of a flat bed without using bedrails?: None Help needed moving to and from a bed to a chair (including a wheelchair)?: None Help needed standing up from a chair using your arms (e.g., wheelchair or bedside chair)?: None Help needed to walk in hospital room?: None Help needed climbing 3-5 steps with a railing? : None 6 Click Score: 24    End of Session Equipment Utilized During Treatment: Oxygen  Activity Tolerance: Patient tolerated treatment well Patient left: in chair;with call bell/phone within reach        Time: 1305-1316 PT Time Calculation (min) (ACUTE ONLY): 11 min   Charges:   PT Evaluation $PT Eval Low Complexity: 1 Low   PT General Charges $$ ACUTE PT VISIT: 1 Visit         Richerd Pinal, PT, DPT 07/30/24, 1:57 PM   Richerd CHRISTELLA Pinal 07/30/2024, 1:57 PM

## 2024-07-30 NOTE — Progress Notes (Signed)
   Patient Name: Bryan Kelley Date of Encounter: 07/30/2024 [facility] Cardiologist: None   Interval Summary  .    Patient seen on a.m. rounds.  Denies any chest pain.  Continues to have chronic shortness of breath complains of abdominal pain.  Has maintained sinus rhythm on telemetry.  Vital Signs .    Vitals:   07/30/24 0410 07/30/24 0636 07/30/24 0930 07/30/24 1215  BP: 137/79 120/78 (!) 116/96 (!) (P) 144/83  Pulse: 70  76 (P) 79  Resp: 20  16 (P) 16  Temp: 97.7 F (36.5 C)  97.7 F (36.5 C) (P) 97.6 F (36.4 C)  TempSrc: Oral   (P) Oral  SpO2: 100%  98% (P) 100%  Weight:      Height:        Intake/Output Summary (Last 24 hours) at 07/30/2024 1257 Last data filed at 07/30/2024 0000 Gross per 24 hour  Intake 613.18 ml  Output 460 ml  Net 153.18 ml      07/28/2024    8:54 PM 07/04/2023   12:03 AM 08/20/2022    3:00 PM  Last 3 Weights  Weight (lbs) 176 lb 5.9 oz 170 lb 180 lb  Weight (kg) 80 kg 77.111 kg 81.647 kg      Telemetry/ECG    Sinus rhythm rate of 60-90 with unifocal PVCs and occasional PACs- Personally Reviewed  Physical Exam .   GEN: No acute distress.   Neck: No JVD Cardiac: RRR, no murmurs, rubs, or gallops.  Respiratory: Expiratory wheezing to auscultation bilaterally. GI: Soft, nontender, non-distended  MS: No edema  Assessment & Plan .     Abnormal EKG suggestive of inferior STEMI -Emergent cardiac catheterization showed mild nonobstructive coronary artery disease -Echocardiogram revealed LVEF 65-70%, no RWMA, G1 DD, no valvular abnormalities -Patient was recommended for medical therapy -He is continued on apixaban  and lieu of aspirin and atorvastatin  80 mg daily -EKG as needed for pain or changes  Atrial fibrillation with RVR -Currently maintaining sinus rhythm on telemetry -Diltiazem  was consolidated from 30 mg every 6 hours to 120 mg long-acting -Continue apixaban  5 mg twice daily for CHA2DS2-VASc of at least 3 for stroke  prophylaxis -Continue with telemetry monitoring  Hypertension -Blood pressure 116/96 -Continued on losartan  50 mg daily -Vital signs per unit protocol  COPD exacerbation -Continues to have expiratory wheezing noted on exam-continued on steroids and nebulizers -Ongoing management per IM  5.   Type 2 diabetes - Continued on insulin  therapy -Ongoing management per IM  For questions or updates, please contact [facility] Please consult [email or web address] for contact info under        Signed, Sherena Machorro, NP

## 2024-07-30 NOTE — Assessment & Plan Note (Deleted)
Chronically wears 4 L of oxygen

## 2024-07-30 NOTE — Progress Notes (Signed)
 Patient's HR sustaining at 130's - 140's, MD made aware of, IV cardizem  15 mg given as per doctor's order.  1800:- HR still sustaining at 138, MD made aware, no new orders, MD said no treatment required for Sinus tachycardia.

## 2024-07-30 NOTE — Progress Notes (Addendum)
 Patient complained of abdomen 10/10, IV Morphine  2mg  given, complained of nausea, IV Zofran  was given. MD made aware of the patient's situation, Fleet Enema and Dulcolax suppository was given. Patient had a very small BM. MD made aware of. Abdominal X-ray done.

## 2024-07-30 NOTE — Progress Notes (Signed)
 Progress Note   Patient: Bryan Kelley FMW:[id number] DOB: October 08, 1956 DOA: 07/28/2024     2 DOS: the patient was seen and examined on 07/30/2024   Brief hospital course: 68 y.o. male with medical history significant of COPD on home O2 at 3 L nasal cannula, anxiety/depression, known LUL pulmonary nodule, being admitted with new onset A-flutter with RVR and COPD exacerbation after initially arriving as a code STEMI going emergently to the Cath Lab with cath revealing nonobstructive disease.  During cath he developed SVT with ectopy treated with adenosine  then developed rapid A-flutter that was treated with a diltiazem  bolus subsequently being placed on an infusion.  His LVEDP was 30 and he was also treated with Lasix .  Per cardiologist, Dr. Anner his coronary showed modest disease that was not amenable to stent placement.  He was started on heparin .  Patient was transferred from the Cath Lab to stepdown in stable position.  He was found to be wheezing and is being treated with DuoNebs and Solu-Medrol  as well. Hospitalist consulted for ongoing management   9/26.  Patient in normal sinus rhythm.  Cardiology to start oral Cardizem  to try to taper off Cardizem  drip.  Will switch heparin  drip over to Eliquis .  Will give lactulose  for constipation. 9/27.  Patient short of breath this morning after washing up.  Prednisone  switched over to Solu-Medrol .  Still seeing ST elevations on monitor.  Assessment and Plan: * Atrial flutter with rapid ventricular response, new onset(HCC) Patient in normal sinus rhythm.  Started on oral Cardizem  and switched off Cardizem  drip on 9/26.  Heparin  drip changed over to Eliquis  on 9/26.    Chest Pain S/P  emergent cardiac catheterization 07/28/2024 --STEMI ruled out Nonobstructive CAD on cath 07/28/2024 No stenting performed Chest pain likely multifactorial related to tachyarrhythmia, COPD exacerbation. Patient with persistent ST elevation.  Cardiology following.  COPD  with acute exacerbation (HCC) Switch back to Solu-Medrol  with wheezing.  Continue nebulizer treatment  Essential hypertension Continue diltiazem  orally  Acute on chronic hypoxic respiratory failure (HCC) Looks like patient chronically wears 4 L.  Initially had respiratory distress requiring BiPAP.  BPH (benign prostatic hyperplasia) Continue tamsulosin   Constipation Will give lactulose  until bowel movement and also increased dose of MiraLAX   Subclinical hyperthyroidism TSH very low but no normal T4.  Recommend checking TFTs in 6 weeks.        Subjective: Patient states he is limited with his ambulation with shortness of breath at baseline.  He states he baseline wears 4 L of oxygen .  After washing up he was breathing hard.  Initially came in with abdominal pain.  Physical Exam: Vitals:   07/30/24 0410 07/30/24 0636 07/30/24 0930 07/30/24 1215  BP: 137/79 120/78 (!) 116/96 (!) (P) 144/83  Pulse: 70  76 (P) 79  Resp: 20  16 (P) 16  Temp: 97.7 F (36.5 C)  97.7 F (36.5 C) (P) 97.6 F (36.4 C)  TempSrc: Oral   (P) Oral  SpO2: 100%  98% (P) 100%  Weight:      Height:       Physical Exam HENT:     Head: Normocephalic.  Eyes:     General: Lids are normal.     Conjunctiva/sclera: Conjunctivae normal.  Cardiovascular:     Rate and Rhythm: Normal rate and regular rhythm.     Heart sounds: Normal heart sounds, S1 normal and S2 normal.  Pulmonary:     Effort: Accessory muscle usage present.     Breath  sounds: Examination of the right-middle field reveals decreased breath sounds and wheezing. Examination of the left-middle field reveals decreased breath sounds and wheezing. Examination of the right-lower field reveals decreased breath sounds and wheezing. Examination of the left-lower field reveals decreased breath sounds and wheezing. Decreased breath sounds and wheezing present. No rhonchi or rales.  Abdominal:     Palpations: Abdomen is soft.     Tenderness: There is no  abdominal tenderness.  Musculoskeletal:     Right lower leg: No swelling.     Left lower leg: No swelling.  Skin:    General: Skin is warm.     Findings: No rash.  Neurological:     Mental Status: He is alert and oriented to person, place, and time.     Data Reviewed: White blood cell count 19.4, hemoglobin 11.7, platelet 219  Family Communication: Refused  Disposition: Status is: Inpatient Remains inpatient appropriate because: Still very tight.  Switch prednisone  over to Solu-Medrol .  Planned Discharge Destination: Home    Time spent: 28 minutes  Author: Charlie Patterson, MD 07/30/2024 12:56 PM  For on call review [email or web address].

## 2024-07-31 ENCOUNTER — Other Ambulatory Visit (HOSPITAL_COMMUNITY): Payer: Self-pay

## 2024-07-31 DIAGNOSIS — I2510 Atherosclerotic heart disease of native coronary artery without angina pectoris: Secondary | ICD-10-CM | POA: Diagnosis not present

## 2024-07-31 DIAGNOSIS — R079 Chest pain, unspecified: Secondary | ICD-10-CM | POA: Diagnosis not present

## 2024-07-31 DIAGNOSIS — I4892 Unspecified atrial flutter: Secondary | ICD-10-CM | POA: Diagnosis not present

## 2024-07-31 DIAGNOSIS — I10 Essential (primary) hypertension: Secondary | ICD-10-CM | POA: Diagnosis not present

## 2024-07-31 DIAGNOSIS — R9431 Abnormal electrocardiogram [ECG] [EKG]: Secondary | ICD-10-CM | POA: Diagnosis not present

## 2024-07-31 DIAGNOSIS — J441 Chronic obstructive pulmonary disease with (acute) exacerbation: Secondary | ICD-10-CM | POA: Diagnosis not present

## 2024-07-31 LAB — GLUCOSE, CAPILLARY
Glucose-Capillary: 156 mg/dL — ABNORMAL HIGH (ref 70–99)
Glucose-Capillary: 204 mg/dL — ABNORMAL HIGH (ref 70–99)
Glucose-Capillary: 226 mg/dL — ABNORMAL HIGH (ref 70–99)
Glucose-Capillary: 198 mg/dL — ABNORMAL HIGH (ref 70–99)

## 2024-07-31 LAB — LIPOPROTEIN A (LPA): Lipoprotein (a): 183.7 nmol/L — ABNORMAL HIGH (ref <75.0)

## 2024-07-31 MED ORDER — MAGNESIUM CITRATE PO SOLN
1.0000 | Freq: Once | ORAL | Status: AC
  Administered 2024-07-31: 1 via ORAL
  Filled 2024-07-31: qty 296

## 2024-07-31 MED ORDER — POLYETHYLENE GLYCOL 3350 17 G PO PACK: 17.0000 g | PACK | Freq: Two times a day (BID) | ORAL | Status: DC

## 2024-07-31 MED ORDER — ARFORMOTEROL TARTRATE 15 MCG/2ML IN NEBU
15.0000 ug | INHALATION_SOLUTION | Freq: Two times a day (BID) | RESPIRATORY_TRACT | Status: DC
  Administered 2024-07-31 – 2024-08-02 (×4): 15 ug via RESPIRATORY_TRACT
  Filled 2024-07-31 (×4): qty 2

## 2024-07-31 MED ORDER — LACTULOSE 10 GM/15ML PO SOLN: 30.0000 g | Freq: Two times a day (BID) | ORAL | Status: DC | PRN

## 2024-07-31 MED ORDER — BUDESONIDE 0.25 MG/2ML IN SUSP
0.2500 mg | Freq: Two times a day (BID) | RESPIRATORY_TRACT | Status: DC
  Administered 2024-07-31 – 2024-08-02 (×4): 0.25 mg via RESPIRATORY_TRACT
  Filled 2024-07-31 (×4): qty 2

## 2024-07-31 NOTE — Progress Notes (Signed)
 Progress Note   Patient: Bryan Kelley FMW:[id number] DOB: 1956/06/06 DOA: 07/28/2024     3 DOS: the patient was seen and examined on 07/31/2024   Brief hospital course: 68 y.o. male with medical history significant of COPD on home O2 at 3 L nasal cannula, anxiety/depression, known LUL pulmonary nodule, being admitted with new onset A-flutter with RVR and COPD exacerbation after initially arriving as a code STEMI going emergently to the Cath Lab with cath revealing nonobstructive disease.  During cath he developed SVT with ectopy treated with adenosine  then developed rapid A-flutter that was treated with a diltiazem  bolus subsequently being placed on an infusion.  His LVEDP was 30 and he was also treated with Lasix .  Per cardiologist, Dr. Anner his coronary showed modest disease that was not amenable to stent placement.  He was started on heparin .  Patient was transferred from the Cath Lab to stepdown in stable position.  He was found to be wheezing and is being treated with DuoNebs and Solu-Medrol  as well. Hospitalist consulted for ongoing management   9/26.  Patient in normal sinus rhythm.  Cardiology to start oral Cardizem  to try to taper off Cardizem  drip.  Will switch heparin  drip over to Eliquis .  Will give lactulose  for constipation. 9/27.  Patient short of breath this morning after washing up.  Prednisone  switched over to Solu-Medrol .  Still seeing ST elevations on monitor.  In afternoon had sinus tachycardia with trying to have bowel movements. 9/28.  Had bowel movement with mag citrate.  Still tight.  Assessment and Plan: * COPD with acute exacerbation (HCC) Continue Solu-Medrol .  Continue nebulizer treatments.  Add budesonide  and Brovana treatments.  Chest Pain S/P  emergent cardiac catheterization 07/28/2024 --STEMI ruled out Nonobstructive CAD on cath 07/28/2024 No stenting performed Chest pain likely multifactorial related to tachyarrhythmia, COPD exacerbation. Patient with  persistent ST elevation.  Cardiology following.  Atrial flutter with rapid ventricular response, new onset(HCC) Patient in normal sinus rhythm.  Started on oral Cardizem  and switched off Cardizem  drip on 9/26.  Heparin  drip changed over to Eliquis  on 9/26.    Essential hypertension Continue diltiazem  orally  Acute on chronic hypoxic respiratory failure (HCC) Chronically wears 4 L.  Initially had respiratory distress requiring BiPAP.  BPH (benign prostatic hyperplasia) Continue tamsulosin   Sinus tachycardia Last night with trouble having bowel movement.  Heart rate better this morning.  Constipation X-ray on 9/27 showed stool throughout the colon with gaseous distention.  Finally had a bowel movement with mag citrate on 9/28.  Subclinical hyperthyroidism TSH very low but no normal T4.  Recommend checking TFTs in 6 weeks.        Subjective: Patient feels okay.  Still tight with his breathing.  Had a bowel movement after mag citrate this morning.  Initially admitted with chest pain and shortness of breath.  Physical Exam: Vitals:   07/31/24 0619 07/31/24 0756 07/31/24 0818 07/31/24 1202  BP:   118/65 139/72  Pulse:   84 71  Resp:   18 18  Temp:   97.8 F (36.6 C) 97.7 F (36.5 C)  TempSrc:   Oral   SpO2: 95% 94% 96% 98%  Weight:      Height:       Physical Exam HENT:     Head: Normocephalic.  Eyes:     General: Lids are normal.     Conjunctiva/sclera: Conjunctivae normal.  Cardiovascular:     Rate and Rhythm: Normal rate and regular rhythm.  Heart sounds: Normal heart sounds, S1 normal and S2 normal.  Pulmonary:     Effort: No accessory muscle usage.     Breath sounds: Examination of the right-middle field reveals decreased breath sounds and wheezing. Examination of the left-middle field reveals decreased breath sounds and wheezing. Examination of the right-lower field reveals decreased breath sounds and wheezing. Examination of the left-lower field reveals  decreased breath sounds and wheezing. Decreased breath sounds and wheezing present. No rhonchi or rales.  Abdominal:     Palpations: Abdomen is soft.     Tenderness: There is no abdominal tenderness.  Musculoskeletal:     Right lower leg: No swelling.     Left lower leg: No swelling.  Skin:    General: Skin is warm.     Findings: No rash.  Neurological:     Mental Status: He is alert and oriented to person, place, and time.     Data Reviewed: Labs white blood cell count 19.4, hemoglobin 11.7 Abdominal x-ray shows large stool burden throughout the colon with gaseous distention of colon.  Family Communication: Declined  Disposition: Status is: Inpatient Remains inpatient appropriate because: Still very tight.  Continue Solu-Medrol .  Planned Discharge Destination: Home    Time spent: 28 minutes  Author: Charlie Patterson, MD 07/31/2024 1:27 PM  For on call review [email or web address].

## 2024-07-31 NOTE — TOC Initial Note (Signed)
 Transition of Care [facility]) - Initial/Assessment Note    Patient Details  Name: Bryan Kelley MRN: [id number] Date of Birth: Sep 15, 1956  [facility]) CM/SW Contact:    Edsel DELENA Fischer, LCSW Phone Number: 07/31/2024, 12:30 PM  Clinical Narrative:                      [facility] messaged hospital pharmacy to assist pt with medications   Patient Goals and CMS Choice            Expected Discharge Plan and Services                                              Prior Living Arrangements/Services                       Activities of Daily Living   ADL Screening (condition at time of admission) Independently performs ADLs?: Yes (appropriate for developmental age) Is the patient deaf or have difficulty hearing?: No Does the patient have difficulty seeing, even when wearing glasses/contacts?: No Does the patient have difficulty concentrating, remembering, or making decisions?: No  Permission Sought/Granted                  Emotional Assessment              Admission diagnosis:  Acute on chronic respiratory failure with hypoxia and hypercapnia (HCC) [J96.21, J96.22] Chest pain [R07.9] Patient Active Problem List   Diagnosis Date Noted   Coronary artery disease involving native heart without angina pectoris 07/30/2024   Generalized abdominal pain 07/30/2024   Sinus tachycardia 07/30/2024   Subclinical hyperthyroidism 07/29/2024   Constipation 07/29/2024   Acute on chronic respiratory failure with hypoxia and hypercapnia (HCC) 07/28/2024   Chest pain 07/28/2024   Atrial flutter with rapid ventricular response, new onset(HCC) 07/28/2024   Chest Pain S/P  emergent cardiac catheterization 07/28/2024 --STEMI ruled out 07/28/2024   CAD , nonobstructive on cath 07/28/2024 07/28/2024   Inferior ST segment elevation 07/28/2024   Essential hypertension 11/13/2023   GERD without esophagitis 11/13/2023   Chronic hypoxemic respiratory failure (HCC)  07/04/2023   Hypocalcemia    Hypokalemia    Glaucoma    Malnutrition of moderate degree 02/20/2021   Acute on chronic hypoxic respiratory failure (HCC) 10/06/2020   Tobacco abuse 07/25/2020   HTN (hypertension) 07/25/2020   Acute on chronic respiratory failure with hypercapnia (HCC) 12/06/2019   Nausea vomiting and diarrhea 12/06/2019   Acute respiratory disease due to COVID-19 virus 12/06/2019   COPD (chronic obstructive pulmonary disease) (HCC) 10/30/2019   Hyperglycemia    COPD with acute exacerbation (HCC) 10/29/2019   COPD exacerbation (HCC) 10/29/2019   Epigastric abdominal pain 08/12/2012   Alcohol  abuse 03/14/2012   Elevated BP 12/07/2011   ED (erectile dysfunction) 07/28/2011   NICOTINE  ADDICTION 10/29/2009   Acute bronchitis 05/07/2009   ERECTILE DYSFUNCTION, NON-ORGANIC 05/04/2009   ANKLE PAIN, RIGHT 10/04/2008   FATIGUE 10/04/2008   Asthma 02/17/2008   GERD (gastroesophageal reflux disease) 02/17/2008   BPH (benign prostatic hyperplasia) 02/17/2008   PCP:  [facility] Pharmacy:   [facility] - [place], [place] - [address] [place] [address] Phone: [phone] Fax: [phone]  [facility] - [place], [place] - [address]  [address] [place] [address] Phone: [phone] Fax: [phone]  [facility] [address]), [place] - [address] [place] ([address]) [place] [address] Phone: [phone] Fax: [phone]  [facility] [place], [place] - [address] [place] [address] Phone: [phone] Fax: [phone]     Social Drivers of Health (SDOH) Social History: SDOH Screenings   Food Insecurity: No Food Insecurity (07/29/2024)  Housing: Low Risk  (07/29/2024)  Transportation Needs: No Transportation Needs (07/29/2024)  Utilities: Not At Risk (07/29/2024)  Social Connections:  Unknown (07/29/2024)  Tobacco Use: Medium Risk (07/29/2024)   SDOH Interventions:     Readmission Risk Interventions     No data to display

## 2024-07-31 NOTE — Assessment & Plan Note (Signed)
 Resolved

## 2024-07-31 NOTE — Plan of Care (Signed)
  Problem: Clinical Measurements: Goal: Respiratory complications will improve Outcome: Progressing   Problem: Activity: Goal: Risk for activity intolerance will decrease Outcome: Progressing   Problem: Nutrition: Goal: Adequate nutrition will be maintained Outcome: Progressing   Problem: Elimination: Goal: Will not experience complications related to bowel motility Outcome: Progressing   Problem: Safety: Goal: Ability to remain free from injury will improve Outcome: Progressing

## 2024-07-31 NOTE — Plan of Care (Signed)
  Problem: Education: Goal: Knowledge of General Education information will improve Description: Including pain rating scale, medication(s)/side effects and non-pharmacologic comfort measures Outcome: Progressing   Problem: Health Behavior/Discharge Planning: Goal: Ability to manage health-related needs will improve Outcome: Progressing   Problem: Clinical Measurements: Goal: Ability to maintain clinical measurements within normal limits will improve Outcome: Progressing Goal: Will remain free from infection Outcome: Progressing Goal: Diagnostic test results will improve Outcome: Progressing Goal: Respiratory complications will improve Outcome: Progressing   Problem: Elimination: Goal: Will not experience complications related to urinary retention Outcome: Progressing   Problem: Cardiovascular: Goal: Vascular access site(s) Level 0-1 will be maintained Outcome: Progressing   Problem: Elimination: Goal: Will not experience complications related to bowel motility Outcome: Not Progressing

## 2024-08-01 DIAGNOSIS — R079 Chest pain, unspecified: Secondary | ICD-10-CM | POA: Diagnosis not present

## 2024-08-01 DIAGNOSIS — J441 Chronic obstructive pulmonary disease with (acute) exacerbation: Secondary | ICD-10-CM | POA: Diagnosis not present

## 2024-08-01 DIAGNOSIS — I10 Essential (primary) hypertension: Secondary | ICD-10-CM | POA: Diagnosis not present

## 2024-08-01 DIAGNOSIS — J9621 Acute and chronic respiratory failure with hypoxia: Secondary | ICD-10-CM | POA: Diagnosis not present

## 2024-08-01 DIAGNOSIS — I2510 Atherosclerotic heart disease of native coronary artery without angina pectoris: Secondary | ICD-10-CM | POA: Diagnosis not present

## 2024-08-01 DIAGNOSIS — I4892 Unspecified atrial flutter: Secondary | ICD-10-CM | POA: Diagnosis not present

## 2024-08-01 LAB — CBC
HCT: 33.8 % — ABNORMAL LOW (ref 39.0–52.0)
Hemoglobin: 11.4 g/dL — ABNORMAL LOW (ref 13.0–17.0)
MCH: 28.9 pg (ref 26.0–34.0)
MCHC: 33.7 g/dL (ref 30.0–36.0)
MCV: 85.6 fL (ref 80.0–100.0)
Platelets: 235 K/uL (ref 150–400)
RBC: 3.95 MIL/uL — ABNORMAL LOW (ref 4.22–5.81)
RDW: 13.2 % (ref 11.5–15.5)
WBC: 11.9 K/uL — ABNORMAL HIGH (ref 4.0–10.5)
nRBC: 0 % (ref 0.0–0.2)

## 2024-08-01 LAB — BASIC METABOLIC PANEL WITH GFR
Anion gap: 11 (ref 5–15)
BUN: 18 mg/dL (ref 8–23)
CO2: 29 mmol/L (ref 22–32)
Calcium: 8.7 mg/dL — ABNORMAL LOW (ref 8.9–10.3)
Chloride: 95 mmol/L — ABNORMAL LOW (ref 98–111)
Creatinine, Ser: 0.79 mg/dL (ref 0.61–1.24)
GFR, Estimated: >60 mL/min (ref >60)
Glucose, Bld: 208 mg/dL — ABNORMAL HIGH (ref 70–99)
Potassium: 4.2 mmol/L (ref 3.5–5.1)
Sodium: 135 mmol/L (ref 135–145)

## 2024-08-01 LAB — GLUCOSE, CAPILLARY
Glucose-Capillary: 146 mg/dL — ABNORMAL HIGH (ref 70–99)
Glucose-Capillary: 192 mg/dL — ABNORMAL HIGH (ref 70–99)
Glucose-Capillary: 231 mg/dL — ABNORMAL HIGH (ref 70–99)
Glucose-Capillary: 232 mg/dL — ABNORMAL HIGH (ref 70–99)

## 2024-08-01 MED ORDER — MAGNESIUM CITRATE PO SOLN
1.0000 | Freq: Once | ORAL | Status: AC
  Administered 2024-08-01: 1 via ORAL
  Filled 2024-08-01: qty 296

## 2024-08-01 MED ORDER — METHYLPREDNISOLONE SODIUM SUCC 125 MG IJ SOLR
80.0000 mg | Freq: Every day | INTRAMUSCULAR | Status: DC
  Administered 2024-08-01: 80 mg via INTRAVENOUS
  Filled 2024-08-01: qty 2

## 2024-08-01 MED ORDER — POLYETHYLENE GLYCOL 3350 17 G PO PACK
17.0000 g | PACK | Freq: Two times a day (BID) | ORAL | Status: DC
  Administered 2024-08-01 – 2024-08-02 (×2): 17 g via ORAL
  Filled 2024-08-01 (×2): qty 1

## 2024-08-01 NOTE — Progress Notes (Signed)
 Mobility Specialist - Progress Note Pre-mobility:  SpO2-88   During mobility:, SpO2-97  Post-mobility: SPO2-94    08/01/24 1000  Mobility  Activity Ambulated independently  Level of Assistance Independent after set-up  Assistive Device None  Distance Ambulated (ft) 300 ft  Range of Motion/Exercises Active  Activity Response Tolerated well  Mobility visit 1 Mobility  Mobility Specialist Start Time (ACUTE ONLY) 1002  Mobility Specialist Stop Time (ACUTE ONLY) 1022  Mobility Specialist Time Calculation (min) (ACUTE ONLY) 20 min   Pt supine in bed upon entry on 3L O2. Pt agreed to mobility. Pt was able to EOB independently. Pt was able to STS independently. Pt ambulated well with CGA for safety with no recovery breaks. After activity pt returned to bed in the upright chair position.Needs are in reach and O2 maintained at 3L.  Clem Rodes Mobility Specialist 08/01/24, 10:29 AM

## 2024-08-01 NOTE — Progress Notes (Signed)
 Progress Note   Patient: Bryan Kelley FMW:[id number] DOB: 11-13-1955 DOA: 07/28/2024     4 DOS: the patient was seen and examined on 08/01/2024   Brief hospital course: 68 y.o. male with medical history significant of COPD on home O2 at 3 L nasal cannula, anxiety/depression, known LUL pulmonary nodule, being admitted with new onset A-flutter with RVR and COPD exacerbation after initially arriving as a code STEMI going emergently to the Cath Lab with cath revealing nonobstructive disease.  During cath he developed SVT with ectopy treated with adenosine  then developed rapid A-flutter that was treated with a diltiazem  bolus subsequently being placed on an infusion.  His LVEDP was 30 and he was also treated with Lasix .  Per cardiologist, Dr. Anner his coronary showed modest disease that was not amenable to stent placement.  He was started on heparin .  Patient was transferred from the Cath Lab to stepdown in stable position.  He was found to be wheezing and is being treated with DuoNebs and Solu-Medrol  as well. Hospitalist consulted for ongoing management   9/26.  Patient in normal sinus rhythm.  Cardiology to start oral Cardizem  to try to taper off Cardizem  drip.  Will switch heparin  drip over to Eliquis .  Will give lactulose  for constipation. 9/27.  Patient short of breath this morning after washing up.  Prednisone  switched over to Solu-Medrol .  Still seeing ST elevations on monitor.  In afternoon had sinus tachycardia with trying to have bowel movements. 9/28.  Had bowel movement with mag citrate.  Still tight. 9/29.  Patient hoping to go home.  Wants another dose of mag citrate.  Still wheezing quite a bit but moving better air  Assessment and Plan: * COPD with acute exacerbation (HCC) Continue Solu-Medrol  (increased dose to 80 mg).  Continue nebulizer treatments.  Continue budesonide  and Brovana treatments.  Chest Pain S/P  emergent cardiac catheterization 07/28/2024 --STEMI ruled  out Nonobstructive CAD on cath 07/28/2024 No stenting performed Chest pain likely multifactorial related to tachyarrhythmia, COPD exacerbation. Patient with persistent ST elevation.  Cardiology following.  Atrial flutter with rapid ventricular response, new onset(HCC) Patient in normal sinus rhythm.  Started on oral Cardizem  and switched off Cardizem  drip on 9/26.  Heparin  drip changed over to Eliquis  on 9/26.    Essential hypertension Continue diltiazem  orally  Acute on chronic hypoxic respiratory failure (HCC) Chronically wears 4 L.  Initially had respiratory distress requiring BiPAP.  BPH (benign prostatic hyperplasia) Continue tamsulosin   Sinus tachycardia Resolved  Constipation X-ray on 9/27 showed stool throughout the colon with gaseous distention.  Finally had a bowel movement with mag citrate on 9/28.  Patient wants to do another dose of mag citrate today.  Subclinical hyperthyroidism TSH very low but no normal T4.  Recommend checking TFTs in 6 weeks.        Subjective: Patient feeling okay.  Still feels short of breath and still has some wheeze.  Had a bowel movement yesterday with mag citrate and wants to do another dose today  Physical Exam: Vitals:   08/01/24 0403 08/01/24 0738 08/01/24 0802 08/01/24 1219  BP: 127/74  126/75 (!) 169/87  Pulse: 76  65 84  Resp:   18 19  Temp: 97.8 F (36.6 C)  97.6 F (36.4 C) 97.8 F (36.6 C)  TempSrc:      SpO2: 99% 98% 95% 94%  Weight:      Height:       Physical Exam HENT:     Head: Normocephalic.  Eyes:     General: Lids are normal.     Conjunctiva/sclera: Conjunctivae normal.  Cardiovascular:     Rate and Rhythm: Normal rate and regular rhythm.     Heart sounds: Normal heart sounds, S1 normal and S2 normal.  Pulmonary:     Effort: No accessory muscle usage.     Breath sounds: Examination of the right-middle field reveals wheezing. Examination of the left-middle field reveals wheezing. Examination of the  right-lower field reveals decreased breath sounds and wheezing. Examination of the left-lower field reveals decreased breath sounds and wheezing. Decreased breath sounds and wheezing present. No rhonchi or rales.  Abdominal:     Palpations: Abdomen is soft.     Tenderness: There is no abdominal tenderness.  Musculoskeletal:     Right lower leg: No swelling.     Left lower leg: No swelling.  Skin:    General: Skin is warm.     Findings: No rash.  Neurological:     Mental Status: He is alert and oriented to person, place, and time.     Data Reviewed: Creatinine 0.79, white blood cell count 11.9, hemoglobin 11.4, platelet count 235  Family Communication: Declined  Disposition: Status is: Inpatient Remains inpatient appropriate because: Continue Solu-Medrol  today and reassess tomorrow.  Patient still with diffuse wheeze.  Moving little bit better air.  Planned Discharge Destination: Home    Time spent: 28 minutes  Author: Charlie Patterson, MD 08/01/2024 12:54 PM  For on call review [email or web address].

## 2024-08-01 NOTE — Inpatient Diabetes Management (Signed)
 Inpatient Diabetes Program Recommendations  AACE/ADA: New Consensus Statement on Inpatient Glycemic Control   Target Ranges:  Prepandial:   less than 140 mg/dL      Peak postprandial:   less than 180 mg/dL (1-2 hours)      Critically ill patients:  140 - 180 mg/dL    Latest Reference Range & Units 07/31/24 08:26 07/31/24 11:33 07/31/24 16:12 07/31/24 20:55  Glucose-Capillary 70 - 99 mg/dL 843 (H) 795 (H) 773 (H) 198 (H)   Review of Glycemic Control  Diabetes history: No Outpatient Diabetes medications: NA Current orders for Inpatient glycemic control: Novolog  0-6 units TID with meals, Novolog  0-5 units at bedtime; Solumedrol 40 mg daily  Inpatient Diabetes Program Recommendations:    Insulin : If steroids are continued as ordered, please consider ordering Novolog  2 units TID with meals for meal coverage if patient eats at least 50% of meals.   Thanks, Earnie Gainer, RN, MSN, CDCES Diabetes Coordinator Inpatient Diabetes Program [phone] (Team Pager from 8am to 5pm)

## 2024-08-02 DIAGNOSIS — Z9889 Other specified postprocedural states: Secondary | ICD-10-CM | POA: Diagnosis not present

## 2024-08-02 DIAGNOSIS — J441 Chronic obstructive pulmonary disease with (acute) exacerbation: Secondary | ICD-10-CM | POA: Diagnosis not present

## 2024-08-02 DIAGNOSIS — I4892 Unspecified atrial flutter: Secondary | ICD-10-CM | POA: Diagnosis not present

## 2024-08-02 DIAGNOSIS — J9621 Acute and chronic respiratory failure with hypoxia: Secondary | ICD-10-CM | POA: Diagnosis not present

## 2024-08-02 LAB — GLUCOSE, CAPILLARY: Glucose-Capillary: 187 mg/dL — ABNORMAL HIGH (ref 70–99)

## 2024-08-02 MED ORDER — PREDNISONE 20 MG PO TABS: ORAL_TABLET | ORAL | 0 refills | Status: DC

## 2024-08-02 MED ORDER — MONTELUKAST SODIUM 10 MG PO TABS: 10.0000 mg | ORAL_TABLET | Freq: Every day | ORAL | 0 refills | Status: DC

## 2024-08-02 MED ORDER — LOSARTAN POTASSIUM 50 MG PO TABS: 50.0000 mg | ORAL_TABLET | Freq: Every day | ORAL | 0 refills | Status: DC

## 2024-08-02 MED ORDER — HYDROCODONE-ACETAMINOPHEN 5-325 MG PO TABS: 1.0000 | ORAL_TABLET | Freq: Four times a day (QID) | ORAL | 0 refills | Status: DC | PRN

## 2024-08-02 MED ORDER — APIXABAN 5 MG PO TABS: 5.0000 mg | ORAL_TABLET | Freq: Two times a day (BID) | ORAL | 0 refills | Status: AC

## 2024-08-02 MED ORDER — POLYETHYLENE GLYCOL 3350 17 G PO PACK: 17.0000 g | PACK | Freq: Two times a day (BID) | ORAL | 0 refills | Status: AC

## 2024-08-02 MED ORDER — BUDESONIDE-FORMOTEROL FUMARATE 80-4.5 MCG/ACT IN AERO: 2.0000 | INHALATION_SPRAY | Freq: Two times a day (BID) | RESPIRATORY_TRACT | 0 refills | Status: DC

## 2024-08-02 MED ORDER — PANTOPRAZOLE SODIUM 20 MG PO TBEC: 20.0000 mg | DELAYED_RELEASE_TABLET | Freq: Every day | ORAL | 0 refills | Status: DC

## 2024-08-02 MED ORDER — PREDNISONE 20 MG PO TABS
40.0000 mg | ORAL_TABLET | Freq: Every day | ORAL | Status: DC
  Administered 2024-08-02: 40 mg via ORAL
  Filled 2024-08-02: qty 2

## 2024-08-02 MED ORDER — DILTIAZEM HCL ER COATED BEADS 120 MG PO CP24: 120.0000 mg | ORAL_CAPSULE | Freq: Every day | ORAL | 0 refills | Status: DC

## 2024-08-02 MED ORDER — ATORVASTATIN CALCIUM 80 MG PO TABS: 80.0000 mg | ORAL_TABLET | Freq: Every day | ORAL | 0 refills | Status: AC

## 2024-08-02 MED ORDER — THEOPHYLLINE ER 400 MG PO TB24: 400.0000 mg | ORAL_TABLET | Freq: Every day | ORAL | 0 refills | Status: DC

## 2024-08-02 NOTE — Assessment & Plan Note (Signed)
 Secondary to steroids.  Patient not a diabetic, hemoglobin A1c 5.9

## 2024-08-02 NOTE — Discharge Summary (Signed)
 Physician Discharge Summary   Patient: Bryan Kelley MRN: [id number] DOB: [birth year]  Admit date:     07/28/2024  Discharge date: 08/02/24  Discharge Physician: Charlie Patterson   PCP: [facility]   Recommendations at discharge:   Follow-up PCP 5 days Follow-up cardiology  Discharge Diagnoses: Principal Problem:   COPD with acute exacerbation (HCC) Active Problems:   Atrial flutter with rapid ventricular response, new onset(HCC)   Chest Pain S/P  emergent cardiac catheterization 07/28/2024 --STEMI ruled out   CAD , nonobstructive on cath 07/28/2024   Chronic hypoxemic respiratory failure (HCC)   Acute on chronic hypoxic respiratory failure (HCC)   Essential hypertension   BPH (benign prostatic hyperplasia)   Elevated blood sugar   Inferior ST segment elevation   Subclinical hyperthyroidism   Constipation   Coronary artery disease involving native heart without angina pectoris   Generalized abdominal pain   Sinus tachycardia  Resolved Problems:   * No resolved hospital problems. *  Hospital Course: 68 y.o. male with medical history significant of COPD on home O2 at 3 L nasal cannula, anxiety/depression, known LUL pulmonary nodule, being admitted with new onset A-flutter with RVR and COPD exacerbation after initially arriving as a code STEMI going emergently to the Cath Lab with cath revealing nonobstructive disease.  During cath he developed SVT with ectopy treated with adenosine  then developed rapid A-flutter that was treated with a diltiazem  bolus subsequently being placed on an infusion.  His LVEDP was 30 and he was also treated with Lasix .  Per cardiologist, Dr. Anner his coronary showed modest disease that was not amenable to stent placement.  He was started on heparin .  Patient was transferred from the Cath Lab to stepdown in stable position.  He was found to be wheezing and is being treated with DuoNebs and Solu-Medrol  as well. Hospitalist consulted for  ongoing management   9/26.  Patient in normal sinus rhythm.  Cardiology to start oral Cardizem  to try to taper off Cardizem  drip.  Will switch heparin  drip over to Eliquis .  Will give lactulose  for constipation. 9/27.  Patient short of breath this morning after washing up.  Prednisone  switched over to Solu-Medrol .  Still seeing ST elevations on monitor.  In afternoon had sinus tachycardia with trying to have bowel movements. 9/28.  Had bowel movement with mag citrate.  Still tight. 9/29.  Patient hoping to go home.  Wants another dose of mag citrate.  Still wheezing quite a bit but moving better air 9/30.  Patient's lungs sound better.  Stable to discharge home.  Assessment and Plan: * COPD with acute exacerbation (HCC) Patient given Solu-Medrol  and nebulizer treatments while here.  Switched over to prednisone  taper.  Symbicort  ordered.  Chest Pain S/P  emergent cardiac catheterization 07/28/2024 --STEMI ruled out Nonobstructive CAD on cath 07/28/2024 No stenting performed Chest pain likely multifactorial related to tachyarrhythmia, COPD exacerbation. Patient with persistent ST elevation.  Cardiology to follow-up as outpatient  Atrial flutter with rapid ventricular response, new onset(HCC) Patient in normal sinus rhythm.  Started on oral Cardizem  and switched off Cardizem  drip on 9/26.  Heparin  drip changed over to Eliquis  on 9/26.  Risk of bleeding explained with the Eliquis .  Had pharmacist give patient 30-day discount card.  Essential hypertension Continue diltiazem  orally  Acute on chronic hypoxic respiratory failure (HCC) Chronically wears 4 L.  Initially had respiratory distress requiring BiPAP.  BPH (benign prostatic hyperplasia) Continue tamsulosin   Sinus tachycardia Resolved  Constipation With abdominal pain.  X-ray on 9/27 showed stool throughout the colon with gaseous distention.  Finally had a bowel movement with mag citrate on 9/28.  Continue MiraLAX  twice a day.  Can use  mag citrate if constipated.  Subclinical hyperthyroidism TSH very low but no normal T4.  Recommend checking TFTs in 6 weeks.  Elevated blood sugar Secondary to steroids.  Patient not a diabetic, hemoglobin A1c 5.9         Consultants: Cardiology Procedures performed: Cardiac catheterization Disposition: Home Diet recommendation:  Cardiac diet DISCHARGE MEDICATION: Allergies as of 08/02/2024       Reactions   Aspirin         Medication List     STOP taking these medications    amLODipine  10 MG tablet Commonly known as: NORVASC    Breo Ellipta  200-25 MCG/ACT Aepb Generic drug: fluticasone  furoate-vilanterol       TAKE these medications    acetaminophen  325 MG tablet Commonly known as: TYLENOL  Take 2 tablets (650 mg total) by mouth every 6 (six) hours as needed for mild pain, fever or headache.   albuterol  108 (90 Base) MCG/ACT inhaler Commonly known as: VENTOLIN  HFA Inhale 2 puffs into the lungs every 6 (six) hours as needed for wheezing or shortness of breath.   apixaban  5 MG Tabs tablet Commonly known as: ELIQUIS  Take 1 tablet (5 mg total) by mouth 2 (two) times daily.   atorvastatin  80 MG tablet Commonly known as: LIPITOR  Take 1 tablet (80 mg total) by mouth daily.   brimonidine  0.2 % ophthalmic solution Commonly known as: ALPHAGAN  SMARTSIG:In Eye(s)   brinzolamide  1 % ophthalmic suspension Commonly known as: AZOPT  Place 1 drop into the left eye 3 (three) times daily.   budesonide -formoterol  80-4.5 MCG/ACT inhaler Commonly known as: Symbicort  Inhale 2 puffs into the lungs 2 (two) times daily.   diltiazem  120 MG 24 hr capsule Commonly known as: CARDIZEM  CD Take 1 capsule (120 mg total) by mouth daily.   HYDROcodone -acetaminophen  5-325 MG tablet Commonly known as: NORCO/VICODIN Take 1 tablet by mouth every 6 (six) hours as needed for severe pain (pain score 7-10).   ipratropium-albuterol  0.5-2.5 (3) MG/3ML Soln Commonly known as:  DUONEB Take 3 mLs by nebulization 4 (four) times daily as needed.   losartan  50 MG tablet Commonly known as: COZAAR  Take 1 tablet (50 mg total) by mouth daily.   montelukast  10 MG tablet Commonly known as: SINGULAIR  Take 1 tablet (10 mg total) by mouth at bedtime.   pantoprazole  20 MG tablet Commonly known as: PROTONIX  Take 1 tablet (20 mg total) by mouth daily.   polyethylene glycol 17 g packet Commonly known as: MIRALAX  / GLYCOLAX  Take 17 g by mouth 2 (two) times daily. What changed:  when to take this Another medication with the same name was removed. Continue taking this medication, and follow the directions you see here.   predniSONE  20 MG tablet Commonly known as: DELTASONE  3 tabs po day 1; 2 tabs po day 2,3; 1 tab po day 4,5; 1/2 tab po day6,7 Start taking on: August 03, 2024   sildenafil 100 MG tablet Commonly known as: VIAGRA Take 100 mg by mouth as needed for erectile dysfunction.   tamsulosin  0.4 MG Caps capsule Commonly known as: FLOMAX  Take 1 capsule (0.4 mg total) by mouth daily.   theophylline  400 MG 24 hr tablet Commonly known as: UNIPHYL Take 1 tablet (400 mg total) by mouth at bedtime.        Follow-up Information  [employer] Go on 08/10/2024.   Why: Wednesday, 10/8 at 1 p.m.  Please follow up after discharged. Contact information: [address] [place] [address] [phone]         Darron Deatrice LABOR, MD Follow up in 2 week(s).   Specialty: Cardiology Why: Hospital Follow-Up: October 14th @ 10:45 AM. Bring a list of medications you are taking and your insurance card to the appointment.  Please follow up after discharged. Contact information: [address] [place] [address] [place] [address] [phone]                Discharge Exam: Fredricka Weights   07/28/24 2054  Weight: 80 kg   Physical Exam HENT:     Head: Normocephalic.  Eyes:     General: Lids are normal.     Conjunctiva/sclera:  Conjunctivae normal.  Cardiovascular:     Rate and Rhythm: Normal rate and regular rhythm.     Heart sounds: Normal heart sounds, S1 normal and S2 normal.  Pulmonary:     Effort: No accessory muscle usage.     Breath sounds: Examination of the right-lower field reveals decreased breath sounds. Examination of the left-lower field reveals decreased breath sounds. Decreased breath sounds present. No wheezing, rhonchi or rales.  Abdominal:     Palpations: Abdomen is soft.     Tenderness: There is no abdominal tenderness.  Musculoskeletal:     Right lower leg: No swelling.     Left lower leg: No swelling.  Skin:    General: Skin is warm.     Findings: No rash.  Neurological:     Mental Status: He is alert and oriented to person, place, and time.      Condition at discharge: stable  The results of significant diagnostics from this hospitalization (including imaging, microbiology, ancillary and laboratory) are listed below for reference.   Imaging Studies: DG Abd 2 Views Result Date: 07/30/2024 CLINICAL DATA:  Abdominal pain EXAM: ABDOMEN - 2 VIEW COMPARISON:  01/24/2009 FINDINGS: Large amount of stool throughout the colon. Mild gaseous distention of the colon. No organomegaly or free air. IMPRESSION: Large stool burden throughout the colon with gaseous distention of the colon. Electronically Signed   By: Franky Crease M.D.   On: 07/30/2024 19:07   ECHOCARDIOGRAM COMPLETE Result Date: 07/29/2024    ECHOCARDIOGRAM REPORT   Patient Name:   RALLY OUCH Date of Exam: 07/29/2024 Medical Rec #:  [id number]     Height:       70.0 in Accession #:    [id number]    Weight:       176.4 lb Date of Birth:  [birth year]     BSA:          1.979 m Patient Age:    4 years      BP:           112/69 mmHg Patient Gender: M             HR:           83 bpm. Exam Location:  [facility] Procedure: 2D Echo, Cardiac Doppler, Color Doppler and Intracardiac            Opacification Agent (Both Spectral and Color Flow Doppler  were            utilized during procedure). Indications:     Atrial Fibrillation I48.91  Congestive Heart Failure I50.9  History:         Patient has prior history of Echocardiogram examinations, most                  recent 05/10/2021. Acute MI; Arrythmias:Atrial Fibrillation.  Sonographer:     Rosina Dunk Referring Phys:  [id number] DAVID W HARDING Diagnosing Phys: Redell Cave MD IMPRESSIONS  1. Left ventricular ejection fraction, by estimation, is 65 to 70%. The left ventricle has normal function. The left ventricle has no regional wall motion abnormalities. Left ventricular diastolic parameters are consistent with Grade I diastolic dysfunction (impaired relaxation).  2. Right ventricular systolic function is normal. The right ventricular size is normal.  3. The mitral valve is normal in structure. No evidence of mitral valve regurgitation.  4. The aortic valve was not well visualized. Aortic valve regurgitation is not visualized.  5. The inferior vena cava is normal in size with greater than 50% respiratory variability, suggesting right atrial pressure of 3 mmHg. FINDINGS  Left Ventricle: Left ventricular ejection fraction, by estimation, is 65 to 70%. The left ventricle has normal function. The left ventricle has no regional wall motion abnormalities. Definity  contrast agent was given IV to delineate the left ventricular  endocardial borders. The left ventricular internal cavity size was normal in size. There is no left ventricular hypertrophy. Left ventricular diastolic parameters are consistent with Grade I diastolic dysfunction (impaired relaxation). Right Ventricle: The right ventricular size is normal. No increase in right ventricular wall thickness. Right ventricular systolic function is normal. Left Atrium: Left atrial size was normal in size. Right Atrium: Right atrial size was normal in size. Pericardium: There is no evidence of pericardial effusion. Mitral Valve: The mitral  valve is normal in structure. No evidence of mitral valve regurgitation. MV peak gradient, 2.3 mmHg. The mean mitral valve gradient is 1.0 mmHg. Tricuspid Valve: The tricuspid valve is grossly normal. Tricuspid valve regurgitation is not demonstrated. Aortic Valve: The aortic valve was not well visualized. Aortic valve regurgitation is not visualized. Aortic valve mean gradient measures 4.0 mmHg. Aortic valve peak gradient measures 7.4 mmHg. Aortic valve area, by VTI measures 3.71 cm. Pulmonic Valve: The pulmonic valve was not well visualized. Pulmonic valve regurgitation is not visualized. Aorta: The aortic root is normal in size and structure. Venous: The inferior vena cava is normal in size with greater than 50% respiratory variability, suggesting right atrial pressure of 3 mmHg. IAS/Shunts: No atrial level shunt detected by color flow Doppler.  LEFT VENTRICLE PLAX 2D LVIDd:         4.20 cm     Diastology LV PW:         1.00 cm     LV e' medial:    8.92 cm/s LV IVS:        0.90 cm     LV E/e' medial:  6.5 LVOT diam:     2.20 cm     LV e' lateral:   9.25 cm/s LV SV:         104         LV E/e' lateral: 6.2 LV SV Index:   53 LVOT Area:     3.80 cm  LV Volumes (MOD) LV vol d, MOD A2C: 60.0 ml LV vol d, MOD A4C: 77.6 ml LV vol s, MOD A2C: 14.1 ml LV vol s, MOD A4C: 14.7 ml LV SV MOD A2C:     45.9 ml LV SV MOD A4C:  77.6 ml LV SV MOD BP:      54.2 ml RIGHT VENTRICLE RV Basal diam:  4.20 cm RV Mid diam:    2.80 cm RV S prime:     12.10 cm/s TAPSE (M-mode): 2.1 cm LEFT ATRIUM           Index        RIGHT ATRIUM           Index LA Vol (A2C): 34.4 ml 17.39 ml/m  RA Area:     14.00 cm                                    RA Volume:   37.80 ml  19.10 ml/m  AORTIC VALVE                    PULMONIC VALVE AV Area (Vmax):    3.72 cm     PV Vmax:        0.93 m/s AV Area (Vmean):   3.66 cm     PV Vmean:       60.500 cm/s AV Area (VTI):     3.71 cm     PV VTI:         0.178 m AV Vmax:           136.00 cm/s  PV Peak grad:    3.4 mmHg AV Vmean:          94.600 cm/s  PV Mean grad:   2.0 mmHg AV VTI:            0.281 m      RVOT Peak grad: 2 mmHg AV Peak Grad:      7.4 mmHg AV Mean Grad:      4.0 mmHg LVOT Vmax:         133.00 cm/s LVOT Vmean:        91.200 cm/s LVOT VTI:          0.274 m LVOT/AV VTI ratio: 0.98  AORTA Ao Root diam: 3.30 cm MITRAL VALVE MV Area (PHT): 3.24 cm    SHUNTS MV Area VTI:   4.82 cm    Systemic VTI:  0.27 m MV Peak grad:  2.3 mmHg    Systemic Diam: 2.20 cm MV Mean grad:  1.0 mmHg    Pulmonic VTI:  0.113 m MV Vmax:       0.75 m/s MV Vmean:      48.2 cm/s MV Decel Time: 234 msec MV E velocity: 57.65 cm/s MV A velocity: 74.70 cm/s MV E/A ratio:  0.77 Redell Cave MD Electronically signed by Redell Cave MD Signature Date/Time: 07/29/2024/12:45:15 PM    Final    CARDIAC CATHETERIZATION Addendum Date: 07/28/2024   Mid LAD lesion is 50% stenosed with 90% stenosed side branch in 2nd Diag.   Ost RCA to Prox RCA lesion is 30% stenosed. Prox RCA lesion is 30% stenosed. Prox RCA to Mid RCA lesion is 25% stenosed.   There is hyperdynamic left ventricular systolic function.  The left ventricular ejection fraction is greater than 65% by visual estimate. LV end diastolic pressure is severely elevated.   A-fib/flutter with RVR developed upon completion of procedure => flutter/fib waves noted with IV adenosine  12 mg => rate improved with 10 mg IV diltiazem  Dominance: Right  FINDINGS Diffuse moderate CAD: The most significant lesion being 90% stenosis of a tiny second diagonal branch associated  with segmental eccentric 50% calcified LAD stenosis. Not flow-limiting. LCx has 2 major OM branches and a small AV groove branch with this with small PL-no notable disease. RCA has tandem 30% proximal lesions and a 20% proximal to mid lesion with TIMI-3 flow down relatively small PDA with trivial posterolateral branches. Hyperdynamic LV significant ectopy with catheter insertion. EF estimated over 70%, and severely elevated  LVEDP of 30 millimercury. Tachyarrhythmia originally suspected to be SVT but with 12 mg IV adenosine , this confirmed the presence of flutter/fib waves. He was then given 10 mg IV diltiazem  with improved heart rate into the 110s.  => Likely A-fib RVR Suspect AECOPD exacerbation exacerbated by tachyarrhythmia.  This could explain his chest pain.  He had notable chest pain when tachycardic that improved with rate control. RECOMMENDATIONS Admit to ICU under TRH service-[name], MD with [facility] and PCCM consultation Check 2D echocardiogram the morning and cycle troponin levels   He will be started on diltiazem  drip after 10 mg IV bolus-titrate for rate control and then convert to p.o. I have stopped his home amlodipine  and started losartan  50 mg daily; would not start aspirin given the plans for DOAC. Reassess volume status in the morning, but he has already had 400 mL out after 40 mg IV Lasix  PCCM to assist TRH with management of a AECOPD Started high-dose statin based on diffuse CAD.   Recommend to resume Rivaroxaban, at currently prescribed dose and frequency.   Patient will be started on IV heparin  starting 2 hours after TR band removal and depending on timing/duration of his arrhythmia, could consider DOAC on discharge.  I think for ease of use, Xarelto may be a better option. Alm MICAEL Clay, MD, MS Alm Clay, M.D., M.S. Interventional Cardiologist [facility]  [address] [place], [place]  [address] [phone]           Fax [phone]   Result Date: 07/28/2024 Table formatting from the original result was not included. Images from the original result were not included.   Mid LAD lesion is 50% stenosed with 90% stenosed side branch in 2nd Diag.   Ost RCA to Prox RCA lesion is 30% stenosed. Prox RCA lesion is 30% stenosed. Prox RCA to Mid RCA lesion is 25% stenosed.   There is hyperdynamic left ventricular systolic function.  The left  ventricular ejection fraction is greater than 65% by visual estimate. LV end diastolic pressure is severely elevated.   A-fib/flutter with RVR developed upon completion of procedure => flutter/fib waves noted with IV adenosine  12 mg => rate improved with 10 mg IV diltiazem  Dominance: Right  FINDINGS Diffuse moderate CAD: The most significant lesion being 90% stenosis of a tiny second diagonal branch associated with segmental eccentric 50% calcified LAD stenosis. Not flow-limiting. LCx has 2 major OM branches and a small AV groove branch with this with small PL-no notable disease. RCA has tandem 30% proximal lesions and a 20% proximal to mid lesion with TIMI-3 flow down relatively small PDA with trivial posterolateral branches. Hyperdynamic LV significant ectopy with catheter insertion. EF estimated over 70%, and severely elevated LVEDP of 30 millimercury. Tachyarrhythmia originally suspected to be SVT but with 12 mg IV adenosine , this confirmed the presence of flutter/fib waves. He was then given 10 mg IV diltiazem  with improved heart rate into the 110s.  => Likely A-fib RVR Suspect AECOPD exacerbation exacerbated by tachyarrhythmia.  This could explain his chest pain.  He  had notable chest pain when tachycardic that improved with rate control. RECOMMENDATIONS Admit to ICU under TRH service-[name], MD with [facility] and PCCM consultation Check 2D echocardiogram the morning and cycle troponin levels   He will be started on diltiazem  drip after 10 mg IV bolus-titrate for rate control and then convert to p.o. I have stopped his home amlodipine  and started losartan  50 mg daily; would not start aspirin given the plans for DOAC. Reassess volume status in the morning, but he has already had 400 mL out after 40 mg IV Lasix  PCCM to assist TRH with management of a AECOPD Started high-dose statin based on diffuse CAD.   Recommend to resume Rivaroxaban, at currently prescribed dose and frequency.   Patient  will be started on IV heparin  starting 2 hours after TR band removal and depending on timing/duration of his arrhythmia, could consider DOAC on discharge.  I think for ease of use, Xarelto may be a better option. Alm MICAEL Clay, MD, MS Alm Clay, M.D., M.S. Interventional Cardiologist [facility]  [address]; [address] [place], [place]  [address] [phone]           Fax [phone]    Microbiology: Results for orders placed or performed during the hospital encounter of 07/28/24  MRSA Next Gen by PCR, Nasal     Status: None   Collection Time: 07/28/24 10:50 PM   Specimen: Nasal Mucosa; Nasal Swab  Result Value Ref Range Status   MRSA by PCR Next Gen NOT DETECTED NOT DETECTED Final    Comment: (NOTE) The GeneXpert MRSA Assay (FDA approved for NASAL specimens only), is one component of a comprehensive MRSA colonization surveillance program. It is not intended to diagnose MRSA infection nor to guide or monitor treatment for MRSA infections. Test performance is not FDA approved in patients less than 78 years old. Performed at [facility], [address] [place], [place] [address]     Labs: CBC: Recent Labs  Lab 07/28/24 2128 07/29/24 0014 07/29/24 0133 07/30/24 0354 08/01/24 0334  WBC  --  8.9 8.9 19.4* 11.9*  NEUTROABS  --  8.1*  --   --   --   HGB 13.6 13.2 13.4 11.7* 11.4*  HCT 40.0 39.8 41.4 35.0* 33.8*  MCV  --  87.3 88.7 86.0 85.6  PLT  --  226 224 219 235   Basic Metabolic Panel: Recent Labs  Lab 07/28/24 2128 07/29/24 0014 08/01/24 0334  NA 135 134* 135  K 4.1 3.9 4.2  CL  --  91* 95*  CO2  --  31 29  GLUCOSE  --  264* 208*  BUN  --  14 18  CREATININE 0.80 0.94 0.79  CALCIUM   --  8.9 8.7*   Liver Function Tests: Recent Labs  Lab 07/29/24 0014  AST 23  ALT 21  ALKPHOS 67  BILITOT 1.7*  PROT 7.1  ALBUMIN 3.8   CBG: Recent Labs  Lab 08/01/24 0803 08/01/24 1221 08/01/24 1656 08/01/24 2141  08/02/24 0802  GLUCAP 146* 192* 231* 232* 187*    Discharge time spent: greater than 30 minutes.  Signed: Charlie Patterson, MD [facility] 08/02/2024

## 2024-08-02 NOTE — TOC CM/SW Note (Signed)
 Transition of Care [facility]) - Inpatient Brief Assessment   Patient Details  Name: Bryan Kelley MRN: [id number] Date of Birth: [birth year]  Transition of Care [facility]) CM/SW Contact:    Lauraine JAYSON Carpen, LCSW Phone Number: 08/02/2024, 9:12 AM   Clinical Narrative: Patient has orders to discharge home today. Chart reviewed. No TOC needs identified. CSW signing off.  Transition of Care Asessment: Insurance and Status: Insurance coverage has been reviewed Patient has primary care physician: Yes Home environment has been reviewed: Single family home Prior level of function:: Independent Prior/Current Home Services: No current home services Social Drivers of Health Review: SDOH reviewed no interventions necessary Readmission risk has been reviewed: Yes Transition of care needs: no transition of care needs at this time

## 2024-08-08 NOTE — Progress Notes (Deleted)
 Cardiology Office Note:  .   Date:  08/08/2024  ID:  [name], DOB 1956-01-03, MRN [id number] PCP: [facility]  [facility] Providers Cardiologist:  None { Click to update primary MD,subspecialty MD or APP then REFRESH:1}   History of Present Illness: .   Bryan Kelley is a 68 y.o. male   with medical history significant of COPD on home O2 at 3 L nasal cannula, anxiety/depression, known LUL pulmonary nodule, admitted with new onset A-flutter with RVR and COPD exacerbation after initially arriving as a code STEMI going emergently to the Cath Lab with cath revealing nonobstructive disease.  During cath he developed SVT with ectopy treated with adenosine  then developed rapid A-flutter that was treated with a diltiazem  bolus subsequently being placed on an infusion.  His LVEDP was 30 and he was also treated with Lasix .  Per cardiologist, Dr. Anner his coronary showed modest disease that was not amenable to stent placement.   ROS: ***  Studies Reviewed: [name]         Prior CV Studies: {Select studies to display:26339}   Echo August 01, 2024 IMPRESSIONS     1. Left ventricular ejection fraction, by estimation, is 65 to 70%. The  left ventricle has normal function. The left ventricle has no regional  wall motion abnormalities. Left ventricular diastolic parameters are  consistent with Grade I diastolic  dysfunction (impaired relaxation).   2. Right ventricular systolic function is normal. The right ventricular  size is normal.   3. The mitral valve is normal in structure. No evidence of mitral valve  regurgitation.   4. The aortic valve was not well visualized. Aortic valve regurgitation  is not visualized.   5. The inferior vena cava is normal in size with greater than 50%  respiratory variability, suggesting right atrial pressure of 3 mmHg.   Cath Aug 01, 2024 FINDINGS Diffuse moderate CAD: The most significant lesion being 90% stenosis of a tiny second diagonal  branch associated with segmental eccentric 50% calcified LAD stenosis. Not flow-limiting.  LCx has 2 major OM branches and a small AV groove branch with this with small PL-no notable disease. RCA has tandem 30% proximal lesions and a 20% proximal to mid lesion with TIMI-3 flow down relatively small PDA with trivial posterolateral branches. Hyperdynamic LV significant ectopy with catheter insertion. EF estimated over 70%, and severely elevated LVEDP of 30 millimercury. Tachyarrhythmia originally suspected to be SVT but with 12 mg IV adenosine , this confirmed the presence of flutter/fib waves. He was then given 10 mg IV diltiazem  with improved heart rate into the 110s.  => Likely A-fib RVR Suspect AECOPD exacerbation exacerbated by tachyarrhythmia.  This could explain his chest pain.  He had notable chest pain when tachycardic that improved with rate control.     RECOMMENDATIONS Admit to ICU under TRH service-[name], MD with [name] and PCCM consultation Check 2D echocardiogram the morning and cycle troponin levels   He will be started on diltiazem  drip after 10 mg IV bolus-titrate for rate control and then convert to p.o. I have stopped his home amlodipine  and started losartan  50 mg daily; would not start aspirin given the plans for DOAC. Reassess volume status in the morning, but he has already had 400 mL out after 40 mg IV Lasix  PCCM to assist TRH with management of a AECOPD Started high-dose statin based on diffuse CAD.   Recommend to resume Rivaroxaban, at currently prescribed dose and frequency.   Patient will be  started on IV heparin  starting 2 hours after TR band removal and depending on timing/duration of his arrhythmia, could consider DOAC on discharge.  I think for ease of use, Xarelto may be a better option.   Risk Assessment/Calculations:   {Does this patient have ATRIAL FIBRILLATION?[phone]} No BP recorded.  {Refresh Note OR Click here to enter BP  :1}***        Physical Exam:   VS:  There were no vitals taken for this visit.   Orhtostatics: No data found. Wt Readings from Last 3 Encounters:  07/28/24 176 lb 5.9 oz (80 kg)  07/04/23 170 lb (77.1 kg)  08/20/22 180 lb (81.6 kg)    GEN: Well nourished, well developed in no acute distress NECK: No JVD; No carotid bruits CARDIAC: ***RRR, no murmurs, rubs, gallops RESPIRATORY:  Clear to auscultation without rales, wheezing or rhonchi  ABDOMEN: Soft, non-tender, non-distended EXTREMITIES:  No edema; No deformity   ASSESSMENT AND PLAN: .    Abnormal EKG suggestive of inferior STEMI -Emergent cardiac catheterization showed mild nonobstructive coronary artery disease -Echocardiogram revealed LVEF 65-70%, no RWMA, G1 DD, no valvular abnormalities -Patient was recommended for medical therapy -He is continued on apixaban  and lieu of aspirin and atorvastatin  80 mg daily    Atrial fibrillation with RVR -Currently maintaining sinus rhythm  -Diltiazem   120 mg long-acting -Continue apixaban  5 mg twice daily for CHA2DS2-VASc of at least 3 for stroke prophylaxis    Hypertension -Blood pressure 116/96 -Continued on losartan  50 mg daily -Vital signs per unit protocol   COPD exacerbation -Continues to have expiratory wheezing noted on exam-continued on steroids and nebulizers -Ongoing management per IM   Type 2 diabetes - Continued on insulin  therapy -Ongoing management per IM       {Are you ordering a CV Procedure (e.g. stress test, cath, DCCV, TEE, etc)?   Press F2        :[id number]}  Dispo: ***  Signed, Olivia Pavy, PA-C

## 2024-08-10 ENCOUNTER — Inpatient Hospital Stay
Admission: EM | Admit: 2024-08-10 | Discharge: 2024-08-16 | DRG: 309 | Disposition: A | Discharge status: Home / Self Care | Source: Ambulatory Visit | Attending: Obstetrics and Gynecology | Admitting: Obstetrics and Gynecology

## 2024-08-10 ENCOUNTER — Observation Stay

## 2024-08-10 ENCOUNTER — Other Ambulatory Visit: Payer: Self-pay

## 2024-08-10 ENCOUNTER — Emergency Department

## 2024-08-10 DIAGNOSIS — I4892 Unspecified atrial flutter: Principal | ICD-10-CM | POA: Diagnosis present

## 2024-08-10 DIAGNOSIS — Z9981 Dependence on supplemental oxygen: Secondary | ICD-10-CM

## 2024-08-10 DIAGNOSIS — H548 Legal blindness, as defined in USA: Secondary | ICD-10-CM | POA: Diagnosis present

## 2024-08-10 DIAGNOSIS — Z87891 Personal history of nicotine dependence: Secondary | ICD-10-CM

## 2024-08-10 DIAGNOSIS — I10 Essential (primary) hypertension: Secondary | ICD-10-CM | POA: Diagnosis present

## 2024-08-10 DIAGNOSIS — H409 Unspecified glaucoma: Secondary | ICD-10-CM | POA: Diagnosis present

## 2024-08-10 DIAGNOSIS — Z825 Family history of asthma and other chronic lower respiratory diseases: Secondary | ICD-10-CM

## 2024-08-10 DIAGNOSIS — Z833 Family history of diabetes mellitus: Secondary | ICD-10-CM

## 2024-08-10 DIAGNOSIS — Z7951 Long term (current) use of inhaled steroids: Secondary | ICD-10-CM

## 2024-08-10 DIAGNOSIS — Z8249 Family history of ischemic heart disease and other diseases of the circulatory system: Secondary | ICD-10-CM

## 2024-08-10 DIAGNOSIS — Z79899 Other long term (current) drug therapy: Secondary | ICD-10-CM

## 2024-08-10 DIAGNOSIS — F32A Depression, unspecified: Secondary | ICD-10-CM | POA: Diagnosis present

## 2024-08-10 DIAGNOSIS — F1010 Alcohol abuse, uncomplicated: Secondary | ICD-10-CM | POA: Diagnosis present

## 2024-08-10 DIAGNOSIS — J449 Chronic obstructive pulmonary disease, unspecified: Secondary | ICD-10-CM | POA: Diagnosis not present

## 2024-08-10 DIAGNOSIS — I2510 Atherosclerotic heart disease of native coronary artery without angina pectoris: Secondary | ICD-10-CM | POA: Diagnosis not present

## 2024-08-10 DIAGNOSIS — J9611 Chronic respiratory failure with hypoxia: Secondary | ICD-10-CM | POA: Diagnosis present

## 2024-08-10 DIAGNOSIS — I4891 Unspecified atrial fibrillation: Secondary | ICD-10-CM | POA: Diagnosis present

## 2024-08-10 DIAGNOSIS — I959 Hypotension, unspecified: Secondary | ICD-10-CM | POA: Diagnosis not present

## 2024-08-10 DIAGNOSIS — T50916A Underdosing of multiple unspecified drugs, medicaments and biological substances, initial encounter: Secondary | ICD-10-CM | POA: Diagnosis present

## 2024-08-10 DIAGNOSIS — Z122 Encounter for screening for malignant neoplasm of respiratory organs: Secondary | ICD-10-CM

## 2024-08-10 DIAGNOSIS — F419 Anxiety disorder, unspecified: Secondary | ICD-10-CM | POA: Diagnosis present

## 2024-08-10 DIAGNOSIS — N400 Enlarged prostate without lower urinary tract symptoms: Secondary | ICD-10-CM | POA: Diagnosis present

## 2024-08-10 DIAGNOSIS — E0580 Other thyrotoxicosis without thyrotoxic crisis or storm: Secondary | ICD-10-CM | POA: Diagnosis present

## 2024-08-10 DIAGNOSIS — Z823 Family history of stroke: Secondary | ICD-10-CM

## 2024-08-10 DIAGNOSIS — E0590 Thyrotoxicosis, unspecified without thyrotoxic crisis or storm: Secondary | ICD-10-CM | POA: Diagnosis present

## 2024-08-10 DIAGNOSIS — Z9861 Coronary angioplasty status: Secondary | ICD-10-CM

## 2024-08-10 DIAGNOSIS — Z886 Allergy status to analgesic agent status: Secondary | ICD-10-CM

## 2024-08-10 DIAGNOSIS — Z8616 Personal history of COVID-19: Secondary | ICD-10-CM

## 2024-08-10 DIAGNOSIS — J441 Chronic obstructive pulmonary disease with (acute) exacerbation: Secondary | ICD-10-CM | POA: Diagnosis not present

## 2024-08-10 DIAGNOSIS — J45909 Unspecified asthma, uncomplicated: Secondary | ICD-10-CM | POA: Diagnosis present

## 2024-08-10 DIAGNOSIS — I441 Atrioventricular block, second degree: Secondary | ICD-10-CM | POA: Diagnosis present

## 2024-08-10 DIAGNOSIS — Z7901 Long term (current) use of anticoagulants: Secondary | ICD-10-CM

## 2024-08-10 DIAGNOSIS — I471 Supraventricular tachycardia, unspecified: Secondary | ICD-10-CM | POA: Diagnosis present

## 2024-08-10 DIAGNOSIS — K219 Gastro-esophageal reflux disease without esophagitis: Secondary | ICD-10-CM | POA: Diagnosis present

## 2024-08-10 DIAGNOSIS — Z635 Disruption of family by separation and divorce: Secondary | ICD-10-CM

## 2024-08-10 DIAGNOSIS — Z91148 Patient's other noncompliance with medication regimen for other reason: Secondary | ICD-10-CM

## 2024-08-10 DIAGNOSIS — E785 Hyperlipidemia, unspecified: Secondary | ICD-10-CM | POA: Diagnosis present

## 2024-08-10 LAB — TROPONIN I (HIGH SENSITIVITY)
Troponin I (High Sensitivity): 16 ng/L (ref <18)
Troponin I (High Sensitivity): 18 ng/L — ABNORMAL HIGH (ref <18)

## 2024-08-10 LAB — CBC
HCT: 40.6 % (ref 39.0–52.0)
Hemoglobin: 13.2 g/dL (ref 13.0–17.0)
MCH: 28.6 pg (ref 26.0–34.0)
MCHC: 32.5 g/dL (ref 30.0–36.0)
MCV: 88.1 fL (ref 80.0–100.0)
Platelets: 267 K/uL (ref 150–400)
RBC: 4.61 MIL/uL (ref 4.22–5.81)
RDW: 13.8 % (ref 11.5–15.5)
WBC: 9.1 K/uL (ref 4.0–10.5)
nRBC: 0 % (ref 0.0–0.2)

## 2024-08-10 LAB — BASIC METABOLIC PANEL WITH GFR
Anion gap: 12 (ref 5–15)
BUN: 14 mg/dL (ref 8–23)
CO2: 24 mmol/L (ref 22–32)
Calcium: 8.6 mg/dL — ABNORMAL LOW (ref 8.9–10.3)
Chloride: 103 mmol/L (ref 98–111)
Creatinine, Ser: 0.85 mg/dL (ref 0.61–1.24)
GFR, Estimated: >60 mL/min (ref >60)
Glucose, Bld: 117 mg/dL — ABNORMAL HIGH (ref 70–99)
Potassium: 4.7 mmol/L (ref 3.5–5.1)
Sodium: 139 mmol/L (ref 135–145)

## 2024-08-10 LAB — PROTIME-INR
INR: 1.2 (ref 0.8–1.2)
Prothrombin Time: 16 s — ABNORMAL HIGH (ref 11.4–15.2)

## 2024-08-10 LAB — BRAIN NATRIURETIC PEPTIDE: B Natriuretic Peptide: 234.2 pg/mL — ABNORMAL HIGH (ref 0.0–100.0)

## 2024-08-10 LAB — APTT: aPTT: 35 s (ref 24–36)

## 2024-08-10 LAB — MAGNESIUM: Magnesium: 2.1 mg/dL (ref 1.7–2.4)

## 2024-08-10 LAB — D-DIMER, QUANTITATIVE: D-Dimer, Quant: 1.54 ug{FEU}/mL — ABNORMAL HIGH (ref 0.00–0.50)

## 2024-08-10 LAB — TSH: TSH: 0.683 u[IU]/mL (ref 0.350–4.500)

## 2024-08-10 MED ORDER — THEOPHYLLINE ER 400 MG PO TB24: 400.0000 mg | ORAL_TABLET | Freq: Every day | ORAL | Status: DC

## 2024-08-10 MED ORDER — ALBUTEROL SULFATE (2.5 MG/3ML) 0.083% IN NEBU
2.5000 mg | INHALATION_SOLUTION | Freq: Four times a day (QID) | RESPIRATORY_TRACT | Status: DC | PRN
  Administered 2024-08-10 – 2024-08-11 (×2): 2.5 mg via RESPIRATORY_TRACT
  Filled 2024-08-10 (×2): qty 3

## 2024-08-10 MED ORDER — IOHEXOL 350 MG/ML SOLN
75.0000 mL | Freq: Once | INTRAVENOUS | Status: AC | PRN
  Administered 2024-08-10: 75 mL via INTRAVENOUS

## 2024-08-10 MED ORDER — DILTIAZEM HCL 25 MG/5ML IV SOLN
20.0000 mg | Freq: Once | INTRAVENOUS | Status: AC
  Administered 2024-08-10: 20 mg via INTRAVENOUS
  Filled 2024-08-10: qty 5

## 2024-08-10 MED ORDER — APIXABAN 5 MG PO TABS
5.0000 mg | ORAL_TABLET | Freq: Two times a day (BID) | ORAL | Status: DC
  Administered 2024-08-10 – 2024-08-16 (×12): 5 mg via ORAL
  Filled 2024-08-10 (×5): qty 1
  Filled 2024-08-10: qty 2
  Filled 2024-08-10 (×7): qty 1

## 2024-08-10 MED ORDER — ATORVASTATIN CALCIUM 80 MG PO TABS
80.0000 mg | ORAL_TABLET | Freq: Every day | ORAL | Status: DC
  Administered 2024-08-11 – 2024-08-16 (×6): 80 mg via ORAL
  Filled 2024-08-10: qty 1
  Filled 2024-08-10: qty 4
  Filled 2024-08-10 (×4): qty 1

## 2024-08-10 MED ORDER — PANTOPRAZOLE SODIUM 20 MG PO TBEC
20.0000 mg | DELAYED_RELEASE_TABLET | Freq: Every day | ORAL | Status: DC
  Administered 2024-08-11 – 2024-08-16 (×6): 20 mg via ORAL
  Filled 2024-08-10 (×7): qty 1

## 2024-08-10 MED ORDER — TAMSULOSIN HCL 0.4 MG PO CAPS
0.4000 mg | ORAL_CAPSULE | Freq: Every day | ORAL | Status: DC
  Administered 2024-08-11 – 2024-08-16 (×6): 0.4 mg via ORAL
  Filled 2024-08-10 (×6): qty 1

## 2024-08-10 MED ORDER — FLUTICASONE FUROATE-VILANTEROL 100-25 MCG/ACT IN AEPB: 1.0000 | INHALATION_SPRAY | Freq: Every day | RESPIRATORY_TRACT | Status: DC

## 2024-08-10 MED ORDER — LOSARTAN POTASSIUM 50 MG PO TABS: 50.0000 mg | ORAL_TABLET | Freq: Every day | ORAL | Status: DC

## 2024-08-10 MED ORDER — DILTIAZEM HCL ER COATED BEADS 120 MG PO CP24
120.0000 mg | ORAL_CAPSULE | Freq: Every day | ORAL | Status: DC
Start: 2024-08-11 — End: 2024-08-12
  Administered 2024-08-11: 120 mg via ORAL
  Filled 2024-08-10 (×2): qty 1

## 2024-08-10 MED ORDER — MONTELUKAST SODIUM 10 MG PO TABS
10.0000 mg | ORAL_TABLET | Freq: Every day | ORAL | Status: DC
  Administered 2024-08-10 – 2024-08-15 (×6): 10 mg via ORAL
  Filled 2024-08-10 (×6): qty 1

## 2024-08-10 MED ORDER — ONDANSETRON HCL 4 MG/2ML IJ SOLN: 4.0000 mg | Freq: Four times a day (QID) | INTRAMUSCULAR | Status: DC | PRN

## 2024-08-10 MED ORDER — DILTIAZEM HCL-DEXTROSE 125-5 MG/125ML-% IV SOLN (PREMIX)
5.0000 mg/h | INTRAVENOUS | Status: DC
  Administered 2024-08-10: 5 mg/h via INTRAVENOUS
  Administered 2024-08-11 (×2): 10 mg/h via INTRAVENOUS
  Filled 2024-08-10 (×3): qty 125

## 2024-08-10 MED ORDER — DIGOXIN 0.25 MG/ML IJ SOLN
0.2500 mg | INTRAMUSCULAR | Status: DC | PRN
  Administered 2024-08-11: 0.25 mg via INTRAVENOUS
  Filled 2024-08-10 (×2): qty 2

## 2024-08-10 MED ORDER — SODIUM CHLORIDE 0.9 % IV BOLUS
500.0000 mL | Freq: Once | INTRAVENOUS | Status: AC
  Administered 2024-08-10: 500 mL via INTRAVENOUS

## 2024-08-10 MED ORDER — ACETAMINOPHEN 325 MG PO TABS: 650.0000 mg | ORAL_TABLET | ORAL | Status: DC | PRN

## 2024-08-10 NOTE — H&P (Signed)
 History and Physical   EDWARDS MCKELVIE FMW:[id number] DOB: 1955/12/01 DOA: 08/10/2024  PCP: [facility]   Patient coming from: Home/PCP  Chief Complaint: Tachycardia  HPI: Bryan Kelley is a 68 y.o. male with medical history significant of atrial flutter, hypertension, GERD, CAD, BPH, alcohol  use, glaucoma, subclinical hypothyroidism, asthma, COPD, chronic respiratory failure with hypoxia presenting with tachycardia from PCP.  Patient recently admitted 9/25-9/30.  Present with chest pain and possible STEMI.  STEMI was ruled out by cath showing nonobstructive CAD.  Patient was in new onset atrial flutter with RVR initially treated with IV diltiazem  and heparin  infusion which was transition to p.o. diltiazem  and Eliquis .  Patient also treated for COPD exacerbation and noted to have low TSH and is being followed for subclinical hyperthyroidism.  Patient had not felt too well since discharge and only picked up/filled his prescriptions yesterday which would be about a week without the new medications including diltiazem  and Eliquis .  Took 1 Eliquis  and had some nausea and did not take his other medication.  Has had some tachycardia at home in the last few days.  Was seen at [facility] today and heart rate noted to be around 160 so he was sent to the ED.  Reports dyspnea on exertion.  Denies fevers, chills, chest pain, abdominal pain, constipation, diarrhea.  ED Course: Vital signs in the ED notable for heart rate in the 150s, blood pressure in the 110s-120s systolic, respiratory rate in the 20s, requiring home 3 L.  Lab workup included BMP with glucose 117, calcium  8.6.  Magnesium  normal.  CBC within normal limits.  PT 16, INR normal.  PTT pending.  Troponin normal with repeat pending.  D-dimer pending.  TSH normal.  BNP pending.  Patient started on diltiazem  infusion in the ED.  Review of Systems: As per HPI otherwise all other systems reviewed and are negative.  Past Medical History:   Diagnosis Date   Acute bronchitis 05/07/2009   Qualifier: Diagnosis of   By: Antonetta MD, Margaret         Acute on chronic hypoxic respiratory failure (HCC) 10/06/2020   Acute on chronic respiratory failure with hypercapnia (HCC) 12/06/2019   Acute on chronic respiratory failure with hypoxia and hypercapnia (HCC) 07/28/2024   Acute respiratory disease due to COVID-19 virus 12/06/2019   Asthma    COPD (chronic obstructive pulmonary disease) (HCC)    COPD with acute exacerbation (HCC) 10/29/2019   Depression    GERD (gastroesophageal reflux disease)    Inferior ST segment elevation 07/28/2024   Sinus tachycardia 07/30/2024   Vision loss, left eye     Past Surgical History:  Procedure Laterality Date   EYE SURGERY  2009, about 8011,8010   steel in left eye on the job, legally blind    LEFT HEART CATH AND CORONARY ANGIOGRAPHY N/A 07/28/2024   Procedure: LEFT HEART CATH AND CORONARY ANGIOGRAPHY;  Surgeon: Anner Alm ORN, MD;  Location: [facility] INVASIVE CV LAB;  Service: Cardiovascular;  Laterality: N/A;    Social History  reports that he quit smoking about 2 years ago. His smoking use included cigarettes. He started smoking about 51 years ago. He has a 24.5 pack-year smoking history. He has never used smokeless tobacco. He reports current alcohol  use of about 2.0 standard drinks of alcohol  per week. He reports that he does not use drugs.  Allergies  Allergen Reactions   Aspirin     Family History  Problem Relation Age of Onset  Diabetes Mother    Hypertension Mother    Stroke Mother    Asthma Father    Diabetes Father    Diabetes Sister    Hypertension Sister    Diabetes Brother    Hypertension Brother   Reviewed on admission  Prior to Admission medications   Medication Sig Start Date End Date Taking? Authorizing Provider  acetaminophen  (TYLENOL ) 325 MG tablet Take 2 tablets (650 mg total) by mouth every 6 (six) hours as needed for mild pain, fever or headache. 07/05/23    Danton Reyes DASEN, MD  albuterol  (VENTOLIN  HFA) 108 (90 Base) MCG/ACT inhaler Inhale 2 puffs into the lungs every 6 (six) hours as needed for wheezing or shortness of breath. 11/30/19   Edelmiro, [place], MD  apixaban  (ELIQUIS ) 5 MG TABS tablet Take 1 tablet (5 mg total) by mouth 2 (two) times daily. 08/02/24   Josette Ade, MD  atorvastatin  (LIPITOR ) 80 MG tablet Take 1 tablet (80 mg total) by mouth daily. 08/02/24   Josette Ade, MD  brimonidine  (ALPHAGAN ) 0.2 % ophthalmic solution SMARTSIG:In Eye(s) 04/05/24   [provider]  brinzolamide  (AZOPT ) 1 % ophthalmic suspension Place 1 drop into the left eye 3 (three) times daily. 04/04/24   [provider]  budesonide -formoterol  (SYMBICORT ) 80-4.5 MCG/ACT inhaler Inhale 2 puffs into the lungs 2 (two) times daily. 08/02/24   Josette Ade, MD  diltiazem  (CARDIZEM  CD) 120 MG 24 hr capsule Take 1 capsule (120 mg total) by mouth daily. 08/02/24   Josette Ade, MD  HYDROcodone -acetaminophen  (NORCO/VICODIN) 5-325 MG tablet Take 1 tablet by mouth every 6 (six) hours as needed for severe pain (pain score 7-10). 08/02/24   Josette Ade, MD  ipratropium-albuterol  (DUONEB) 0.5-2.5 (3) MG/3ML SOLN Take 3 mLs by nebulization 4 (four) times daily as needed. 06/03/24   [provider]  losartan  (COZAAR ) 50 MG tablet Take 1 tablet (50 mg total) by mouth daily. 08/02/24   Josette Ade, MD  montelukast  (SINGULAIR ) 10 MG tablet Take 1 tablet (10 mg total) by mouth at bedtime. 08/02/24 09/01/24  Josette Ade, MD  pantoprazole  (PROTONIX ) 20 MG tablet Take 1 tablet (20 mg total) by mouth daily. 08/02/24 09/01/24  Josette Ade, MD  polyethylene glycol (MIRALAX  / GLYCOLAX ) 17 g packet Take 17 g by mouth 2 (two) times daily. 08/02/24   Josette Ade, MD  predniSONE  (DELTASONE ) 20 MG tablet 3 tabs po day 1; 2 tabs po day 2,3; 1 tab po day 4,5; 1/2 tab po day6,7 08/03/24   Josette Ade, MD  sildenafil (VIAGRA) 100 MG tablet Take  100 mg by mouth as needed for erectile dysfunction. 07/26/24   [provider]  tamsulosin  (FLOMAX ) 0.4 MG CAPS capsule Take 1 capsule (0.4 mg total) by mouth daily. 07/05/23   Danton Reyes DASEN, MD  theophylline  (UNIPHYL) 400 MG 24 hr tablet Take 1 tablet (400 mg total) by mouth at bedtime. 08/02/24   Josette Ade, MD    Physical Exam: Vitals:   08/10/24 1630 08/10/24 1645 08/10/24 1700 08/10/24 1715  BP: (!) 149/101     Pulse: (!) 161 (!) 160 (!) 160 (!) 160  Resp: (!) 29 (!) 24 20 (!) 24  Temp:      SpO2: 99% 100% 100% 100%  Weight:      Height:        Physical Exam Constitutional:      General: He is not in acute distress.    Appearance: Normal appearance.  HENT:     Head:  Normocephalic and atraumatic.     Mouth/Throat:     Mouth: Mucous membranes are moist.     Pharynx: Oropharynx is clear.  Eyes:     Extraocular Movements: Extraocular movements intact.     Pupils: Pupils are equal, round, and reactive to light.  Cardiovascular:     Rate and Rhythm: Regular rhythm. Tachycardia present.     Pulses: Normal pulses.     Heart sounds: Normal heart sounds.  Pulmonary:     Effort: Pulmonary effort is normal. No respiratory distress.     Breath sounds: Normal breath sounds.  Abdominal:     General: Bowel sounds are normal. There is no distension.     Palpations: Abdomen is soft.     Tenderness: There is no abdominal tenderness.  Musculoskeletal:        General: No swelling or deformity.  Skin:    General: Skin is warm and dry.  Neurological:     General: No focal deficit present.     Mental Status: Mental status is at baseline.    Labs on Admission: I have personally reviewed following labs and imaging studies  CBC: Recent Labs  Lab 08/10/24 1456  WBC 9.1  HGB 13.2  HCT 40.6  MCV 88.1  PLT 267    Basic Metabolic Panel: Recent Labs  Lab 08/10/24 1456  NA 139  K 4.7  CL 103  CO2 24  GLUCOSE 117*  BUN 14  CREATININE 0.85  CALCIUM  8.6*  MG  2.1    GFR: Estimated Creatinine Clearance: 84.6 mL/min (by C-G formula based on SCr of 0.85 mg/dL).  Liver Function Tests: No results for input(s): AST, ALT, ALKPHOS, BILITOT, PROT, ALBUMIN in the last 168 hours.  Urine analysis: No results found for: COLORURINE, APPEARANCEUR, LABSPEC, PHURINE, GLUCOSEU, HGBUR, BILIRUBINUR, KETONESUR, PROTEINUR, UROBILINOGEN, NITRITE, LEUKOCYTESUR  Radiological Exams on Admission: DG Chest 2 View Result Date: 08/10/2024 CLINICAL DATA:  Tachycardia. EXAM: CHEST - 2 VIEW COMPARISON:  November 13, 2023. FINDINGS: The heart size and mediastinal contours are within normal limits. Both lungs are clear. The visualized skeletal structures are unremarkable. IMPRESSION: No active cardiopulmonary disease. Electronically Signed   By: Lynwood Landy Raddle M.D.   On: 08/10/2024 15:41   EKG: Independently reviewed.  Initial EKG showed SVT possible A-flutter with RVR at 150 bpm.  Somewhat limited by significant baseline wander.  Repeat EKG later after treatment showed atrial flutter with baseline wander at 84 bpm.  Nonspecific T wave changes.  Assessment/Plan Active Problems:   Atrial flutter with rapid ventricular response, new onset(HCC)   CAD , nonobstructive on cath 07/28/2024   COPD (chronic obstructive pulmonary disease) (HCC)   Chronic hypoxemic respiratory failure (HCC)   Essential hypertension   BPH (benign prostatic hyperplasia)   Asthma   GERD (gastroesophageal reflux disease)   Alcohol  abuse   Glaucoma   GERD without esophagitis   Subclinical hyperthyroidism   Atrial flutter with RVR > Newly diagnosed during recent admission and patient was discharged on diltiazem  and Eliquis  but had not picked these prescriptions up until yesterday.  He has only taken 1 dose of Eliquis . > Found to be tachycardic at PCP appointment today in the 160s and sent to the ED.  Confirmed to be in atrial flutter with RVR in the ED and started on  IV diltiazem  again.  Heart rate improving in the ED. - Monitor in progressive unit overnight - Continue with diltiazem  infusion - Continue with Eliquis  - Restart home p.o. diltiazem  tomorrow -  No reason for repeat echocardiogram - Discussed need for continued adherence with these 2 medications and this chronic condition, will need close cardiology follow-up for further discussions - Supportive care  Hypertension - Continue diltiazem  as above - Continue home losartan   GERD - Continue home PPI  Nonobstructive CAD - Continue losartan , atorvastatin , Eliquis   BPH - Continue tamsulosin   Alcohol  use - Noted  Glaucoma - Continue eyedrops when confirmed  Subclinical hyperthyroidism - Repeat TSH normal - Continue outpatient follow-up  COPD Asthma Chronic respiratory failure with hypoxia > On home 3 L. - Continue home oxygen  - Replace home Symbicort  or formulary Breo - Continue home theophylline  and Singulair   DVT prophylaxis: Eliquis  Code Status:   Full Family Communication:  None on admission  Disposition Plan:   Patient is from:  Home  Anticipated DC to:  Home  Anticipated DC date:  1 to 2 days  Anticipated DC barriers: None  Consults called:  None Admission status:  Observation, progressive  Severity of Illness: The appropriate patient status for this patient is OBSERVATION. Observation status is judged to be reasonable and necessary in order to provide the required intensity of service to ensure the patient's safety. The patient's presenting symptoms, physical exam findings, and initial radiographic and laboratory data in the context of their medical condition is felt to place them at decreased risk for further clinical deterioration. Furthermore, it is anticipated that the patient will be medically stable for discharge from the hospital within 2 midnights of admission.    Marsa KATHEE Scurry MD [facility]  How to contact the [facility]H Attending or Consulting  provider 7A - 7P or covering provider during after hours 7P -7A, for this patient?   Check the care team in [facility] and look for a) attending/consulting TRH provider listed and b) the TRH team listed Log into [email or web address] and use [facility] universal password to access. If you do not have the password, please contact the hospital operator. Locate the TR[facility] provider you are looking for under [facility] and page to a number that you can be directly reached. If you still have difficulty reaching the provider, please page the [facility] (Director on Call) for the Hospitalists listed on amion for assistance.  08/10/2024, 5:25 PM

## 2024-08-10 NOTE — ED Provider Notes (Signed)
 [facility] Provider Note    Event Date/Time   First MD Initiated Contact with Patient 08/10/24 1459     (approximate)   History   Tachycardia   HPI  Bryan Kelley is a 68 y.o. male  ignificant of COPD on home O2 at 3 L nasal cannula, anxiety/depression, known LUL pulmonary nodule, recently admitted as a code STEMI found to have nonobstructive CAD found to have a flutter with RVR discharged on diltiazem  who presents from his primary care physician's office with palpitations found to be in a flutter.  Patient states that he has felt unwell since discharge but has not been able to fill any of his medications.  He recently was able to get the bottles of prescriptions yesterday and took a single dose of his Eliquis  felt nauseous did not take any of his other medications.  He has had continued palpitations for the last several days.  Denies any chest pain.  He has shortness of breath with exertion but none currently while lying in bed.  Denies any abdominal pain or changes in urinary or bowel habits.  He was seen at a clinic today and they noted that his heart rate was 160 and sent him to the emergency department.  Denies any SI HI or AVHs      Physical Exam   Triage Vital Signs: ED Triage Vitals  Encounter Vitals Group     BP 08/10/24 1448 (!) 122/92     Girls Systolic BP Percentile --      Girls Diastolic BP Percentile --      Boys Systolic BP Percentile --      Boys Diastolic BP Percentile --      Pulse Rate 08/10/24 1448 (!) 158     Resp 08/10/24 1448 14     Temp 08/10/24 1448 97.9 F (36.6 C)     Temp src --      SpO2 08/10/24 1448 100 %     Weight 08/10/24 1449 180 lb (81.6 kg)     Height 08/10/24 1449 5' 6 (1.676 m)     Head Circumference --      Peak Flow --      Pain Score 08/10/24 1449 0     Pain Loc --      Pain Education --      Exclude from Growth Chart --     Most recent vital signs: Vitals:   08/10/24 1700 08/10/24 1715  BP:     Pulse: (!) 160 (!) 160  Resp: 20 (!) 24  Temp:    SpO2: 100% 100%    Nursing Triage Note reviewed. Vital signs reviewed and patients oxygen  saturation is normoxic  General: Patient is well nourished, well developed, awake and alert, Head: Normocephalic and atraumatic Eyes: Normal inspection, extraocular muscles intact, no conjunctival pallor Ear, nose, throat: Normal external exam Neck: Normal range of motion Respiratory: Patient is in no respiratory distress, lungs CTAB Cardiovascular: Patient is tachycardic, irregularly irregular GI: Abd SNT with no guarding or rebound  Back: Normal inspection of the back with good strength and range of motion throughout all ext Extremities: pulses intact with good cap refills, no LE pitting edema or calf tenderness Neuro: The patient is alert and oriented to person, place, and time, appropriately conversive, with 5/5 bilat UE/LE strength, no gross motor or sensory defects noted. Coordination appears to be adequate. Skin: Warm, dry, and intact Psych: normal mood and affect, no SI or HI  ED Results / Procedures / Treatments   Labs (all labs ordered are listed, but only abnormal results are displayed) Labs Reviewed  BASIC METABOLIC PANEL WITH GFR - Abnormal; Notable for the following components:      Result Value   Glucose, Bld 117 (*)    Calcium  8.6 (*)    All other components within normal limits  PROTIME-INR - Abnormal; Notable for the following components:   Prothrombin Time 16.0 (*)    All other components within normal limits  D-DIMER, QUANTITATIVE - Abnormal; Notable for the following components:   D-Dimer, Quant 1.54 (*)    All other components within normal limits  CBC  TSH  MAGNESIUM   APTT  BRAIN NATRIURETIC PEPTIDE  TROPONIN I (HIGH SENSITIVITY)  TROPONIN I (HIGH SENSITIVITY)     EKG EKG and rhythm strip are interpreted by myself:   EKG: A-fib at heart rate of 158, normal QRS duration, QTc 443, nonspecific ST segments  and T waves no ectopy EKG not consistent with Acute STEMI Rhythm strip: afib in lead II  Second EKG EKG and rhythm strip are interpreted by myself:   EKG: A flutter at heart rate of 84, normal QRS duration, QTc 399, nonspecific ST segments and T waves no ectopy EKG not consistent with Acute STEMI Rhythm strip: A flutter in lead II   RADIOLOGY Xray chest: No acute abnormalities on my independent review interpretation and radiologist agrees    PROCEDURES:  Critical Care performed: Yes, see critical care procedure note(s)  .Critical Care  Performed by: Nicholaus Rolland BRAVO, MD Authorized by: Nicholaus Rolland BRAVO, MD   Critical care provider statement:    Critical care time (minutes):  32   Critical care was necessary to treat or prevent imminent or life-threatening deterioration of the following conditions:  Cardiac failure   Critical care was time spent personally by me on the following activities:  Development of treatment plan with patient or surrogate, discussions with consultants, evaluation of patient's response to treatment, examination of patient, ordering and review of laboratory studies, ordering and review of radiographic studies, ordering and performing treatments and interventions, pulse oximetry, re-evaluation of patient's condition and review of old charts   Care discussed with: admitting provider   Comments:     A flutter with rapid ventricular rate requiring diltiazem  bolus reassessment and repeat EKG and ultimately diltiazem  drip    MEDICATIONS ORDERED IN ED: Medications  diltiazem  (CARDIZEM ) 125 mg in dextrose  5% 125 mL (1 mg/mL) infusion (10 mg/hr Intravenous Rate/Dose Change 08/10/24 1720)  sodium chloride  0.9 % bolus 500 mL (0 mLs Intravenous Stopped 08/10/24 1559)  diltiazem  (CARDIZEM ) injection 20 mg (20 mg Intravenous Given 08/10/24 1525)     IMPRESSION / MDM / ASSESSMENT AND PLAN / ED COURSE                                Differential diagnosis includes,  but is not limited to, arrhythmia, PE, ACS, electrolyte derangement, anemia  ED course: Patient presents with an irregular rhythm with a rate of 160 given prior history suspect a flutter versus A-fib as opposed to supraventricular tachycardia.  Reassured that he is not hypotensive.  He does have ST elevations on his EKG however given recent cath do not suspect this is consistent with ACS and patient denies any chest pain.  Troponin is not elevated and he has no leukocytosis or profound electrolyte derangements.  I did send a  D-dimer as he has not been compliant with his blood thinner and this is currently pending.  Patient was bolused with 0.25 mg/kg diltiazem  and was rate controlled for the time but subsequently heart rate up trended again to the 160s.  This did temporarily drop his blood pressure to the systolic high 90s.  I have placed an order for diltiazem  drip will call him in for admission at this time but suspect his reoccurrence of a flutter was secondary to medication noncompliance   Clinical Course as of 08/10/24 1725  Wed Aug 10, 2024  1723 D-Dimer, Quant(!): 1.54 Elevated will order the CT PE.  Case discussed with hospitalist for admission [HD]    Clinical Course User Index [HD] Nicholaus Rolland BRAVO, MD   -- Risk: 5 This patient has a high risk of morbidity due to further diagnostic testing or treatment. Rationale: This patient's evaluation and management involve a high risk of morbidity due to the potential severity of presenting symptoms, need for diagnostic testing, and/or initiation of treatment that may require close monitoring. The differential includes conditions with potential for significant deterioration or requiring escalation of care. Treatment decisions in the ED, including medication administration, procedural interventions, or disposition planning, reflect this level of risk. COPA: 5 The patient has the following acute or chronic illness/injury that poses a possible threat to  life or bodily function: [X] : The patient has a potentially serious acute condition or an acute exacerbation of a chronic illness requiring urgent evaluation and management in the Emergency Department. The clinical presentation necessitates immediate consideration of life-threatening or function-threatening diagnoses, even if they are ultimately ruled out.   FINAL CLINICAL IMPRESSION(S) / ED DIAGNOSES   Final diagnoses:  Atrial flutter with rapid ventricular response (HCC)  Non compliance w medication regimen     Rx / DC Orders   ED Discharge Orders     None        Note:  This document was prepared using Dragon voice recognition software and may include unintentional dictation errors.   Nicholaus Rolland BRAVO, MD 08/10/24 1725

## 2024-08-10 NOTE — ED Notes (Signed)
 Patient transported to CT

## 2024-08-10 NOTE — ED Triage Notes (Signed)
 Pt comes in vial [facility] with with complaints of rapid heart rate. According to EMS the pt was recently discharged from the hospital due to a cardiac event. Pt was admitted on 09/25 and discharged on 09/30. Pt was prescribed eliquis  and cardizem  on discharge, but hasn't taken his medication yet. Pt was seen at [facility] today, and found to have a HR of 160. Pt with no complaints of pain or SOB. Pt is on 3L [place] O2 at baseline.Pt is alert and oriented x4 with no signs of acute distress at this time.

## 2024-08-10 NOTE — ED Notes (Signed)
 This RN attempted to reach out to MD Seena about Diltiazem  drip--regarding blood pressure and unchanged heart rate since initiating drip. MD did not answer message. The secretary attempted to page MD, but no response.

## 2024-08-11 DIAGNOSIS — K219 Gastro-esophageal reflux disease without esophagitis: Secondary | ICD-10-CM | POA: Diagnosis present

## 2024-08-11 DIAGNOSIS — Z9981 Dependence on supplemental oxygen: Secondary | ICD-10-CM | POA: Diagnosis not present

## 2024-08-11 DIAGNOSIS — E782 Mixed hyperlipidemia: Secondary | ICD-10-CM | POA: Diagnosis not present

## 2024-08-11 DIAGNOSIS — I10 Essential (primary) hypertension: Secondary | ICD-10-CM | POA: Diagnosis present

## 2024-08-11 DIAGNOSIS — I471 Supraventricular tachycardia, unspecified: Secondary | ICD-10-CM | POA: Diagnosis present

## 2024-08-11 DIAGNOSIS — Z7951 Long term (current) use of inhaled steroids: Secondary | ICD-10-CM | POA: Diagnosis not present

## 2024-08-11 DIAGNOSIS — Z91148 Patient's other noncompliance with medication regimen for other reason: Secondary | ICD-10-CM | POA: Diagnosis not present

## 2024-08-11 DIAGNOSIS — F1010 Alcohol abuse, uncomplicated: Secondary | ICD-10-CM | POA: Diagnosis present

## 2024-08-11 DIAGNOSIS — Z8616 Personal history of COVID-19: Secondary | ICD-10-CM | POA: Diagnosis not present

## 2024-08-11 DIAGNOSIS — E0590 Thyrotoxicosis, unspecified without thyrotoxic crisis or storm: Secondary | ICD-10-CM | POA: Diagnosis not present

## 2024-08-11 DIAGNOSIS — Z9861 Coronary angioplasty status: Secondary | ICD-10-CM | POA: Diagnosis not present

## 2024-08-11 DIAGNOSIS — F32A Depression, unspecified: Secondary | ICD-10-CM | POA: Diagnosis present

## 2024-08-11 DIAGNOSIS — N400 Enlarged prostate without lower urinary tract symptoms: Secondary | ICD-10-CM | POA: Diagnosis present

## 2024-08-11 DIAGNOSIS — J9611 Chronic respiratory failure with hypoxia: Secondary | ICD-10-CM | POA: Diagnosis not present

## 2024-08-11 DIAGNOSIS — E785 Hyperlipidemia, unspecified: Secondary | ICD-10-CM

## 2024-08-11 DIAGNOSIS — Z87891 Personal history of nicotine dependence: Secondary | ICD-10-CM | POA: Diagnosis present

## 2024-08-11 DIAGNOSIS — F419 Anxiety disorder, unspecified: Secondary | ICD-10-CM | POA: Diagnosis present

## 2024-08-11 DIAGNOSIS — Z833 Family history of diabetes mellitus: Secondary | ICD-10-CM | POA: Diagnosis not present

## 2024-08-11 DIAGNOSIS — I4892 Unspecified atrial flutter: Secondary | ICD-10-CM | POA: Diagnosis not present

## 2024-08-11 DIAGNOSIS — J441 Chronic obstructive pulmonary disease with (acute) exacerbation: Secondary | ICD-10-CM | POA: Diagnosis not present

## 2024-08-11 DIAGNOSIS — Z8249 Family history of ischemic heart disease and other diseases of the circulatory system: Secondary | ICD-10-CM | POA: Diagnosis not present

## 2024-08-11 DIAGNOSIS — J449 Chronic obstructive pulmonary disease, unspecified: Secondary | ICD-10-CM | POA: Diagnosis not present

## 2024-08-11 DIAGNOSIS — E0580 Other thyrotoxicosis without thyrotoxic crisis or storm: Secondary | ICD-10-CM | POA: Diagnosis present

## 2024-08-11 DIAGNOSIS — Z823 Family history of stroke: Secondary | ICD-10-CM | POA: Diagnosis not present

## 2024-08-11 DIAGNOSIS — I4891 Unspecified atrial fibrillation: Secondary | ICD-10-CM | POA: Diagnosis present

## 2024-08-11 DIAGNOSIS — I441 Atrioventricular block, second degree: Secondary | ICD-10-CM | POA: Diagnosis present

## 2024-08-11 DIAGNOSIS — Z7901 Long term (current) use of anticoagulants: Secondary | ICD-10-CM | POA: Diagnosis not present

## 2024-08-11 DIAGNOSIS — Z825 Family history of asthma and other chronic lower respiratory diseases: Secondary | ICD-10-CM | POA: Diagnosis not present

## 2024-08-11 DIAGNOSIS — I2510 Atherosclerotic heart disease of native coronary artery without angina pectoris: Secondary | ICD-10-CM | POA: Diagnosis present

## 2024-08-11 DIAGNOSIS — Z79899 Other long term (current) drug therapy: Secondary | ICD-10-CM | POA: Diagnosis not present

## 2024-08-11 LAB — CBC
HCT: 37.2 % — ABNORMAL LOW (ref 39.0–52.0)
Hemoglobin: 12 g/dL — ABNORMAL LOW (ref 13.0–17.0)
MCH: 28.6 pg (ref 26.0–34.0)
MCHC: 32.3 g/dL (ref 30.0–36.0)
MCV: 88.8 fL (ref 80.0–100.0)
Platelets: 240 K/uL (ref 150–400)
RBC: 4.19 MIL/uL — ABNORMAL LOW (ref 4.22–5.81)
RDW: 14 % (ref 11.5–15.5)
WBC: 8.8 K/uL (ref 4.0–10.5)
nRBC: 0 % (ref 0.0–0.2)

## 2024-08-11 LAB — BASIC METABOLIC PANEL WITH GFR
Anion gap: 6 (ref 5–15)
BUN: 17 mg/dL (ref 8–23)
CO2: 27 mmol/L (ref 22–32)
Calcium: 8.3 mg/dL — ABNORMAL LOW (ref 8.9–10.3)
Chloride: 105 mmol/L (ref 98–111)
Creatinine, Ser: 1.07 mg/dL (ref 0.61–1.24)
GFR, Estimated: >60 mL/min (ref >60)
Glucose, Bld: 134 mg/dL — ABNORMAL HIGH (ref 70–99)
Potassium: 4.1 mmol/L (ref 3.5–5.1)
Sodium: 138 mmol/L (ref 135–145)

## 2024-08-11 MED ORDER — DIGOXIN 0.25 MG/ML IJ SOLN
0.2500 mg | Freq: Once | INTRAMUSCULAR | Status: AC
Start: 2024-08-11 — End: 2024-08-11
  Administered 2024-08-11: 0.25 mg via INTRAVENOUS
  Filled 2024-08-11: qty 2

## 2024-08-11 MED ORDER — MELATONIN 5 MG PO TABS
5.0000 mg | ORAL_TABLET | Freq: Every day | ORAL | Status: DC
Start: 2024-08-11 — End: 2024-08-16
  Administered 2024-08-11 – 2024-08-15 (×6): 5 mg via ORAL
  Filled 2024-08-11 (×6): qty 1

## 2024-08-11 MED ORDER — IPRATROPIUM-ALBUTEROL 0.5-2.5 (3) MG/3ML IN SOLN
3.0000 mL | Freq: Four times a day (QID) | RESPIRATORY_TRACT | Status: DC | PRN
  Administered 2024-08-11 – 2024-08-13 (×6): 3 mL via RESPIRATORY_TRACT
  Filled 2024-08-11 (×7): qty 3

## 2024-08-11 MED ORDER — METOPROLOL TARTRATE 25 MG PO TABS
25.0000 mg | ORAL_TABLET | Freq: Four times a day (QID) | ORAL | Status: DC
  Administered 2024-08-11 – 2024-08-13 (×7): 25 mg via ORAL
  Filled 2024-08-11 (×7): qty 1

## 2024-08-11 MED ORDER — BRIMONIDINE TARTRATE 0.2 % OP SOLN
1.0000 [drp] | Freq: Two times a day (BID) | OPHTHALMIC | Status: DC
  Administered 2024-08-11 – 2024-08-16 (×11): 1 [drp] via OPHTHALMIC
  Filled 2024-08-11 (×2): qty 5

## 2024-08-11 MED ORDER — FLUTICASONE FUROATE-VILANTEROL 100-25 MCG/ACT IN AEPB
1.0000 | INHALATION_SPRAY | Freq: Every day | RESPIRATORY_TRACT | Status: DC
  Filled 2024-08-11: qty 28

## 2024-08-11 MED ORDER — BRINZOLAMIDE 1 % OP SUSP
1.0000 [drp] | Freq: Two times a day (BID) | OPHTHALMIC | Status: DC
  Administered 2024-08-11 – 2024-08-16 (×11): 1 [drp] via OPHTHALMIC
  Filled 2024-08-11: qty 10

## 2024-08-11 NOTE — Consult Note (Signed)
 Cardiology Consultation   Patient ID: STARK AGUINAGA MRN: [id number]; DOB: 1956-10-20  Admit date: 08/10/2024 Date of Consult: 08/11/2024  PCP:  [facility]   [facility] Providers Cardiologist:  None      Patient Profile: DADRIAN BALLANTINE is a 68 y.o. male with a hx of COPD on home 3L, GERD, HTN who is being seen 08/11/2024 for the evaluation of Afib RVR at the request of Dr. Kandis.  History of Present Illness: Mr. Cleland was recently in the hospital 9/25-9/30 September for inferior STEMI, COPD exacerbation, Afib RVR. Cardiac cath showed nonocclusive CAD, most notable lesion was a small/diminutive 90% stenosis but no occlusive disease. STEMI diagnosis excluded. Afib noted in the cath lab with rates up to 150 s/p adenosine  started on IV dilt. CHADSVASC of 3. IV heparin  was changed to Eliquis . IV dilt was changed to oral dilt. Echo showed LVEF 65-70%, no WMA, G1DD. The patient converted to NSR prior to discharge.   The patient presented to the ER 08/10/24 with rapid heart rate to the 160s.[facility] was [facility]. He did not pick up his medications until a week after discharge. He took one dose of Eliquis  prior to coming in.  He reported DOE. No chest pain, fever, chills, nausea.  In the ER HR 150s, BP normal, RR 20s, on home 3L O2. Labs showed BG 117, normal Mag, normal CBC. HS trop 16>18. Ddimer 1.54. BNP 234. EKG showed SVT 159, likely Aflutter. He was started on IV dilt with improvement of HR. Repeat EKG showed Afib/flutter 84bpm. Hear rate continued to be high and cardiology was asked to see.   Past Medical History:  Diagnosis Date   Acute bronchitis 05/07/2009   Qualifier: Diagnosis of   By: Antonetta MD, Margaret         Acute on chronic hypoxic respiratory failure (HCC) 10/06/2020   Acute on chronic respiratory failure with hypercapnia (HCC) 12/06/2019   Acute on chronic respiratory failure with hypoxia and hypercapnia (HCC) 07/28/2024   Acute respiratory disease  due to COVID-19 virus 12/06/2019   Asthma    COPD (chronic obstructive pulmonary disease) (HCC)    COPD with acute exacerbation (HCC) 10/29/2019   Depression    GERD (gastroesophageal reflux disease)    Inferior ST segment elevation 07/28/2024   Sinus tachycardia 07/30/2024   Vision loss, left eye     Past Surgical History:  Procedure Laterality Date   EYE SURGERY  2009, about [phone]   steel in left eye on the job, legally blind    LEFT HEART CATH AND CORONARY ANGIOGRAPHY N/A 07/28/2024   Procedure: LEFT HEART CATH AND CORONARY ANGIOGRAPHY;  Surgeon: Anner Alm ORN, MD;  Location: [facility] INVASIVE CV LAB;  Service: Cardiovascular;  Laterality: N/A;     Home Medications:  Prior to Admission medications   Medication Sig Start Date End Date Taking? Authorizing Provider  acetaminophen  (TYLENOL ) 325 MG tablet Take 2 tablets (650 mg total) by mouth every 6 (six) hours as needed for mild pain, fever or headache. 07/05/23  Yes Danton Reyes DASEN, MD  albuterol  (VENTOLIN  HFA) 108 [phone] Base) MCG/ACT inhaler Inhale 2 puffs into the lungs every 6 (six) hours as needed for wheezing or shortness of breath. 11/30/19  Yes Edelmiro, [place], MD  atorvastatin  (LIPITOR ) 80 MG tablet Take 1 tablet (80 mg total) by mouth daily. 08/02/24  Yes Wieting, Richard, MD  brimonidine  (ALPHAGAN ) 0.2 % ophthalmic solution Place 1 drop into the left eye 2 (two)  times daily. 04/05/24  Yes [provider]  brinzolamide  (AZOPT ) 1 % ophthalmic suspension Place 1 drop into the left eye 2 (two) times daily. 04/04/24  Yes [provider]  diltiazem  (CARDIZEM  CD) 120 MG 24 hr capsule Take 1 capsule (120 mg total) by mouth daily. 08/02/24  Yes Wieting, Richard, MD  ipratropium-albuterol  (DUONEB) 0.5-2.5 (3) MG/3ML SOLN Take 3 mLs by nebulization 4 (four) times daily as needed. 06/03/24  Yes [provider]  losartan  (COZAAR ) 50 MG tablet Take 1 tablet (50 mg total) by mouth daily. 08/02/24  Yes Wieting, Richard,  MD  montelukast  (SINGULAIR ) 10 MG tablet Take 1 tablet (10 mg total) by mouth at bedtime. 08/02/24 09/01/24 Yes Wieting, Richard, MD  pantoprazole  (PROTONIX ) 20 MG tablet Take 1 tablet (20 mg total) by mouth daily. 08/02/24 09/01/24 Yes Wieting, Richard, MD  polyethylene glycol (MIRALAX  / GLYCOLAX ) 17 g packet Take 17 g by mouth 2 (two) times daily. 08/02/24  Yes Wieting, Richard, MD  sildenafil (VIAGRA) 100 MG tablet Take 100 mg by mouth as needed for erectile dysfunction. 07/26/24  Yes [provider]  tamsulosin  (FLOMAX ) 0.4 MG CAPS capsule Take 1 capsule (0.4 mg total) by mouth daily. 07/05/23  Yes Danton Reyes DASEN, MD  apixaban  (ELIQUIS ) 5 MG TABS tablet Take 1 tablet (5 mg total) by mouth 2 (two) times daily. Patient not taking: Reported on 08/10/2024 08/02/24   Josette Ade, MD  budesonide -formoterol  (SYMBICORT ) 80-4.5 MCG/ACT inhaler Inhale 2 puffs into the lungs 2 (two) times daily. Patient not taking: Reported on 08/10/2024 08/02/24   Josette Ade, MD  HYDROcodone -acetaminophen  (NORCO/VICODIN) 5-325 MG tablet Take 1 tablet by mouth every 6 (six) hours as needed for severe pain (pain score 7-10). Patient not taking: Reported on 08/10/2024 08/02/24   Josette Ade, MD  predniSONE  (DELTASONE ) 20 MG tablet 3 tabs po day 1; 2 tabs po day 2,3; 1 tab po day 4,5; 1/2 tab po day6,7 Patient not taking: Reported on 08/10/2024 08/03/24   Josette Ade, MD  theophylline  (UNIPHYL) 400 MG 24 hr tablet Take 1 tablet (400 mg total) by mouth at bedtime. Patient not taking: Reported on 08/10/2024 08/02/24   Josette Ade, MD    Scheduled Meds:  apixaban   5 mg Oral BID   atorvastatin   80 mg Oral Daily   diltiazem   120 mg Oral Daily   melatonin  5 mg Oral QHS   montelukast   10 mg Oral QHS   pantoprazole   20 mg Oral Daily   tamsulosin   0.4 mg Oral Daily   Continuous Infusions:  diltiazem  (CARDIZEM ) infusion 15 mg/hr (08/11/24 0737)   PRN Meds: acetaminophen , albuterol , digoxin,  ondansetron  (ZOFRAN ) IV  Allergies:    Allergies  Allergen Reactions   Aspirin     Social History:   Social History   Socioeconomic History   Marital status: Legally Separated    Spouse name: Not on file   Number of children: Not on file   Years of education: Not on file   Highest education level: Not on file  Occupational History   Not on file  Tobacco Use   Smoking status: Former    Current packs/day: 0.00    Average packs/day: 0.5 packs/day for 49.0 years (24.5 ttl pk-yrs)    Types: Cigarettes    Start date: 03/1973    Quit date: 03/2022    Years since quitting: 2.4   Smokeless tobacco: Never  Vaping Use   Vaping status: Never Used  Substance and Sexual Activity  Alcohol  use: Yes    Alcohol /week: 2.0 standard drinks of alcohol     Types: 2 Cans of beer per week   Drug use: No   Sexual activity: Not on file  Other Topics Concern   Not on file  Social History Narrative   Not on file   Social Drivers of Health   Financial Resource Strain: Not on file  Food Insecurity: No Food Insecurity (07/29/2024)   Hunger Vital Sign    Worried About Running Out of Food in the Last Year: Never true    Ran Out of Food in the Last Year: Never true  Transportation Needs: No Transportation Needs (07/29/2024)   PRAPARE - Administrator, Civil Service (Medical): No    Lack of Transportation (Non-Medical): No  Physical Activity: Not on file  Stress: Not on file  Social Connections: Unknown (07/29/2024)   Social Connection and Isolation Panel    Frequency of Communication with Friends and Family: Patient declined    Frequency of Social Gatherings with Friends and Family: Not on file    Attends Religious Services: Patient declined    Active Member of Clubs or Organizations: Patient declined    Attends Banker Meetings: Patient declined    Marital Status: Patient declined  Intimate Partner Violence: Not At Risk (07/29/2024)   Humiliation, Afraid, Rape, and  Kick questionnaire    Fear of Current or Ex-Partner: No    Emotionally Abused: No    Physically Abused: No    Sexually Abused: No    Family History:    Family History  Problem Relation Age of Onset   Diabetes Mother    Hypertension Mother    Stroke Mother    Asthma Father    Diabetes Father    Diabetes Sister    Hypertension Sister    Diabetes Brother    Hypertension Brother      ROS:  Please see the history of present illness.   All other ROS reviewed and negative.     Physical Exam/Data: Vitals:   08/11/24 0130 08/11/24 0430 08/11/24 0530 08/11/24 0720  BP: 111/79 113/79 100/81 96/76  Pulse: 83 81 (!) 161 (!) 158  Resp: 20 20 19 16   Temp:  98.2 F (36.8 C)    TempSrc:  Oral    SpO2: 100% 100% 100% 100%  Weight:      Height:       No intake or output data in the 24 hours ending 08/11/24 0809    08/10/2024    2:49 PM 07/28/2024    8:54 PM 07/04/2023   12:03 AM  Last 3 Weights  Weight (lbs) 180 lb 176 lb 5.9 oz 170 lb  Weight (kg) 81.647 kg 80 kg 77.111 kg     Body mass index is 29.05 kg/m.  General:  Well nourished, well developed, in no acute distress HEENT: normal Neck: no JVD Vascular: No carotid bruits; Distal pulses 2+ bilaterally Cardiac:  normal S1, S2; tachycardia; no murmur  Lungs:  clear to auscultation bilaterally, no wheezing, rhonchi or rales  Abd: soft, nontender, no hepatomegaly  Ext: no edema Musculoskeletal:  No deformities, BUE and BLE strength normal and equal Skin: warm and dry  Neuro:  CNs 2-12 intact, no focal abnormalities noted Psych:  Normal affect   EKG:  The EKG was personally reviewed and demonstrates:  Aflutter 156bpm Telemetry:  Telemetry was personally reviewed and demonstrates:  Aflutter HR 150s  Relevant CV Studies:  Echo 07/2024  1. Left ventricular ejection fraction, by estimation, is 65 to 70%. The  left ventricle has normal function. The left ventricle has no regional  wall motion abnormalities. Left  ventricular diastolic parameters are  consistent with Grade I diastolic  dysfunction (impaired relaxation).   2. Right ventricular systolic function is normal. The right ventricular  size is normal.   3. The mitral valve is normal in structure. No evidence of mitral valve  regurgitation.   4. The aortic valve was not well visualized. Aortic valve regurgitation  is not visualized.   5. The inferior vena cava is normal in size with greater than 50%  respiratory variability, suggesting right atrial pressure of 3 mmHg.   LHC 07/2024    Mid LAD lesion is 50% stenosed with 90% stenosed side branch in 2nd Diag.   Ost RCA to Prox RCA lesion is 30% stenosed. Prox RCA lesion is 30% stenosed. Prox RCA to Mid RCA lesion is 25% stenosed.   There is hyperdynamic left ventricular systolic function.  The left ventricular ejection fraction is greater than 65% by visual estimate. LV end diastolic pressure is severely elevated.   A-fib/flutter with RVR developed upon completion of procedure => flutter/fib waves noted with IV adenosine  12 mg => rate improved with 10 mg IV diltiazem    Dominance: Right      FINDINGS Diffuse moderate CAD: The most significant lesion being 90% stenosis of a tiny second diagonal branch associated with segmental eccentric 50% calcified LAD stenosis. Not flow-limiting.  LCx has 2 major OM branches and a small AV groove branch with this with small PL-no notable disease. RCA has tandem 30% proximal lesions and a 20% proximal to mid lesion with TIMI-3 flow down relatively small PDA with trivial posterolateral branches. Hyperdynamic LV significant ectopy with catheter insertion. EF estimated over 70%, and severely elevated LVEDP of 30 millimercury. Tachyarrhythmia originally suspected to be SVT but with 12 mg IV adenosine , this confirmed the presence of flutter/fib waves. He was then given 10 mg IV diltiazem  with improved heart rate into the 110s.  => Likely A-fib RVR Suspect AECOPD  exacerbation exacerbated by tachyarrhythmia.  This could explain his chest pain.  He had notable chest pain when tachycardic that improved with rate control.     RECOMMENDATIONS Admit to ICU under TRH service-[name], MD with [facility] and PCCM consultation Check 2D echocardiogram the morning and cycle troponin levels   He will be started on diltiazem  drip after 10 mg IV bolus-titrate for rate control and then convert to p.o. I have stopped his home amlodipine  and started losartan  50 mg daily; would not start aspirin given the plans for DOAC. Reassess volume status in the morning, but he has already had 400 mL out after 40 mg IV Lasix  PCCM to assist TRH with management of a AECOPD Started high-dose statin based on diffuse CAD.   Recommend to resume Rivaroxaban, at currently prescribed dose and frequency.   Patient will be started on IV heparin  starting 2 hours after TR band removal and depending on timing/duration of his arrhythmia, could consider DOAC on discharge.  I think for ease of use, Xarelto may be a better option.        Laboratory Data: High Sensitivity Troponin:   Recent Labs  Lab 07/28/24 2252 07/29/24 0133 07/29/24 0426 08/10/24 1456 08/10/24 1643  TROPONINIHS 16 19* 15 16 18*     Chemistry Recent Labs  Lab 08/10/24 1456 08/11/24 0544  NA 139 138  K  4.7 4.1  CL 103 105  CO2 24 27  GLUCOSE 117* 134*  BUN 14 17  CREATININE 0.85 1.07  CALCIUM  8.6* 8.3*  MG 2.1  --   GFRNONAA >60 >60  ANIONGAP 12 6    No results for input(s): PROT, ALBUMIN, AST, ALT, ALKPHOS, BILITOT in the last 168 hours. Lipids No results for input(s): CHOL, TRIG, HDL, LABVLDL, LDLCALC, CHOLHDL in the last 168 hours.  Hematology Recent Labs  Lab 08/10/24 1456 08/11/24 0544  WBC 9.1 8.8  RBC 4.61 4.19*  HGB 13.2 12.0*  HCT 40.6 37.2*  MCV 88.1 88.8  MCH 28.6 28.6  MCHC 32.5 32.3  RDW 13.8 14.0  PLT 267 240   Thyroid  Recent Labs  Lab  08/10/24 1456  TSH 0.683    BNP Recent Labs  Lab 08/10/24 1643  BNP 234.2*    DDimer  Recent Labs  Lab 08/10/24 1643  DDIMER 1.54*    Radiology/Studies:  CT Angio Chest PE W and/or Wo Contrast Result Date: 08/10/2024 CLINICAL DATA:  Tachycardia, elevated D-dimer.  Atrial flutter. EXAM: CT ANGIOGRAPHY CHEST WITH CONTRAST TECHNIQUE: Multidetector CT imaging of the chest was performed using the standard protocol during bolus administration of intravenous contrast. Multiplanar CT image reconstructions and MIPs were obtained to evaluate the vascular anatomy. RADIATION DOSE REDUCTION: This exam was performed according to the departmental dose-optimization program which includes automated exposure control, adjustment of the mA and/or kV according to patient size and/or use of iterative reconstruction technique. CONTRAST:  75mL OMNIPAQUE  IOHEXOL  350 MG/ML SOLN COMPARISON:  January 04, 2024. FINDINGS: Cardiovascular: Satisfactory opacification of the pulmonary arteries to the segmental level. No evidence of pulmonary embolism. Normal heart size. No pericardial effusion. Coronary artery calcifications are noted. Mediastinum/Nodes: No enlarged mediastinal, hilar, or axillary lymph nodes. Thyroid gland, trachea, and esophagus demonstrate no significant findings. Lungs/Pleura: No pneumothorax or pleural effusion is noted. Minimal bibasilar subsegmental atelectasis or scarring is noted. Mild emphysematous disease is noted. Upper Abdomen: No acute abnormality. Musculoskeletal: No chest wall abnormality. No acute or significant osseous findings. Review of the MIP images confirms the above findings. IMPRESSION: 1. No definite evidence of pulmonary embolus. 2. Coronary artery calcifications are noted suggesting coronary artery disease. 3. Minimal bibasilar subsegmental atelectasis or scarring is noted. 4. Emphysema. Emphysema (ICD10-J43.9). Electronically Signed   By: Lynwood Landy Raddle M.D.   On: 08/10/2024 19:12   DG  Chest 2 View Result Date: 08/10/2024 CLINICAL DATA:  Tachycardia. EXAM: CHEST - 2 VIEW COMPARISON:  November 13, 2023. FINDINGS: The heart size and mediastinal contours are within normal limits. Both lungs are clear. The visualized skeletal structures are unremarkable. IMPRESSION: No active cardiopulmonary disease. Electronically Signed   By: Lynwood Landy Raddle M.D.   On: 08/10/2024 15:41     Assessment and Plan:  Afib RVR - presented with tachycardia found to be in Afib/flutter with heart rates in the 150s - Eliquis  5mg  BID. He was not taking this PTA. He waited one week to pick up meds after d/c. He took one dose prior to admission - he is on IV dilt 15mg /hr,  dilt 120mg  daily, IV digoxin - he is asymptomatic - prior echo with normal EF - can consider stopping oral dilt and trying metoprolol, but will likely need to consider IV amiodarone given persistent rates in the 150s  Nonobstructive CAD - no chest pain reported - continue statin  HTN - low on IV dilt and oral dilt - losartan  held  COPD on 3L  O2 - stable on 3L O2    For questions or updates, please contact [facility] Please consult [email or web address] for contact info under      Signed, Jessicia Napolitano VEAR Fishman, PA-C  08/11/2024 8:09 AM

## 2024-08-11 NOTE — ED Notes (Signed)
 Messaged MD via secure chat regarding pt's HR being in the 150's and BP of 96/76 with parameters not meeting titration requirements on diltiazem .

## 2024-08-11 NOTE — Care Management Obs Status (Signed)
 MEDICARE OBSERVATION STATUS NOTIFICATION   Patient Details  Name: Bryan Kelley MRN: [id number] Date of Birth: [birth year]   Medicare Observation Status Notification Given:  Yes    [name] 08/11/2024, 12:16 PM

## 2024-08-11 NOTE — Progress Notes (Signed)
 Patient transferred to bed, able to stand and walk standby d/t IV diltiazem . Pt on home O2 at 3L. Alert and oriented x 4. Skin intact. Provided with box lunch. Call bell in reach, bed alarm on, bed locked and low. NAD. Tele box 5.

## 2024-08-11 NOTE — ED Notes (Signed)
 MD at bedside.

## 2024-08-11 NOTE — Progress Notes (Signed)
 PROGRESS NOTE    Bryan Kelley  FMW:[id number] DOB: 02-13-1956 DOA: 08/10/2024 PCP: [facility]     Brief Narrative:   Bryan Kelley is a 68 y.o. male with medical history significant of atrial flutter, hypertension, GERD, CAD, BPH, alcohol  use, glaucoma, subclinical hypothyroidism, asthma, COPD, chronic respiratory failure with hypoxia presenting with tachycardia from PCP.   Patient recently admitted 9/25-9/30.  Present with chest pain and possible STEMI.  STEMI was ruled out by cath showing nonobstructive CAD.  Patient was in new onset atrial flutter with RVR initially treated with IV diltiazem  and heparin  infusion which was transition to p.o. diltiazem  and Eliquis .  Patient also treated for COPD exacerbation and noted to have low TSH and is being followed for subclinical hyperthyroidism.   Patient had not felt too well since discharge and only picked up/filled his prescriptions yesterday which would be about a week without the new medications including diltiazem  and Eliquis .  Took 1 Eliquis  and had some nausea and did not take his other medication.  Has had some tachycardia at home in the last few days.  Was seen at [facility] today and heart rate noted to be around 160 so he was sent to the ED.   Reports dyspnea on exertion.  Denies fevers, chills, chest pain, abdominal pain, constipation, diarrhea.     Assessment & Plan:   Principal Problem:   Atrial flutter with rapid ventricular response, new onset(HCC) Active Problems:   CAD , nonobstructive on cath 07/28/2024   COPD (chronic obstructive pulmonary disease) (HCC)   Chronic hypoxemic respiratory failure (HCC)   Essential hypertension   BPH (benign prostatic hyperplasia)   Asthma   GERD (gastroesophageal reflux disease)   Alcohol  abuse   Tobacco abuse   Glaucoma   GERD without esophagitis   Subclinical hyperthyroidism  # A-fib/flutter with rvr Recent diagnosis, not compliant w/ d/c meds, overnight started on  dilt gtt but remains in rvr with relative hypotension, fortunately asymptomatic - cardiology consult, possible switch to amio - cont apixaban   # HTN Hypotensive as above - holding home losartan   # COPD Stable - home inhalers  # Non-obstructive CAD On recent cath - home atorva  # Chronic hypoxic respiratory failure Stable on home 3 liters - breo for home symbicort   # BPH - home flomax    DVT prophylaxis: therapeutic apixaban  Code Status: full Family Communication: none at bedside  Level of care: Progressive Status is: Observation    Consultants:  cardiology  Procedures: none  Antimicrobials:  none    Subjective: Reports feeling fine, no palpitations or chest pain, dyspnea is at baseline  Objective: Vitals:   08/11/24 0130 08/11/24 0430 08/11/24 0530 08/11/24 0720  BP: 111/79 113/79 100/81 96/76  Pulse: 83 81 (!) 161 (!) 158  Resp: 20 20 19 16   Temp:  98.2 F (36.8 C)    TempSrc:  Oral    SpO2: 100% 100% 100% 100%  Weight:      Height:       No intake or output data in the 24 hours ending 08/11/24 0814 Filed Weights   08/10/24 1449  Weight: 81.6 kg    Examination:  General exam: Appears calm and comfortable  Respiratory system: faint exp wheeze, normal wob Cardiovascular system: S1 & S2 heard, tachycardic, irreg Gastrointestinal system: Abdomen is nondistended, soft and nontender.   Central nervous system: Alert and oriented. No focal neurological deficits. Extremities: Symmetric 5 x 5 power. Skin: No rashes, lesions or ulcers Psychiatry:  Judgement and insight appear normal. Mood & affect appropriate.     Data Reviewed: I have personally reviewed following labs and imaging studies  CBC: Recent Labs  Lab 08/10/24 1456 08/11/24 0544  WBC 9.1 8.8  HGB 13.2 12.0*  HCT 40.6 37.2*  MCV 88.1 88.8  PLT 267 240   Basic Metabolic Panel: Recent Labs  Lab 08/10/24 1456 08/11/24 0544  NA 139 138  K 4.7 4.1  CL 103 105  CO2 24 27   GLUCOSE 117* 134*  BUN 14 17  CREATININE 0.85 1.07  CALCIUM  8.6* 8.3*  MG 2.1  --    GFR: Estimated Creatinine Clearance: 67.2 mL/min (by C-G formula based on SCr of 1.07 mg/dL). Liver Function Tests: No results for input(s): AST, ALT, ALKPHOS, BILITOT, PROT, ALBUMIN in the last 168 hours. No results for input(s): LIPASE, AMYLASE in the last 168 hours. No results for input(s): AMMONIA in the last 168 hours. Coagulation Profile: Recent Labs  Lab 08/10/24 1456  INR 1.2   Cardiac Enzymes: No results for input(s): CKTOTAL, CKMB, CKMBINDEX, TROPONINI in the last 168 hours. BNP (last 3 results) No results for input(s): PROBNP in the last 8760 hours. HbA1C: No results for input(s): HGBA1C in the last 72 hours. CBG: No results for input(s): GLUCAP in the last 168 hours. Lipid Profile: No results for input(s): CHOL, HDL, LDLCALC, TRIG, CHOLHDL, LDLDIRECT in the last 72 hours. Thyroid Function Tests: Recent Labs    08/10/24 1456  TSH 0.683   Anemia Panel: No results for input(s): VITAMINB12, FOLATE, FERRITIN, TIBC, IRON, RETICCTPCT in the last 72 hours. Urine analysis: No results found for: COLORURINE, APPEARANCEUR, LABSPEC, PHURINE, GLUCOSEU, HGBUR, BILIRUBINUR, KETONESUR, PROTEINUR, UROBILINOGEN, NITRITE, LEUKOCYTESUR Sepsis Labs: @LABRCNTIP (procalcitonin:4,lacticidven:4)  )No results found for this or any previous visit (from the past 240 hours).       Radiology Studies: CT Angio Chest PE W and/or Wo Contrast Result Date: 08/10/2024 CLINICAL DATA:  Tachycardia, elevated D-dimer.  Atrial flutter. EXAM: CT ANGIOGRAPHY CHEST WITH CONTRAST TECHNIQUE: Multidetector CT imaging of the chest was performed using the standard protocol during bolus administration of intravenous contrast. Multiplanar CT image reconstructions and MIPs were obtained to evaluate the vascular anatomy. RADIATION DOSE REDUCTION:  This exam was performed according to the departmental dose-optimization program which includes automated exposure control, adjustment of the mA and/or kV according to patient size and/or use of iterative reconstruction technique. CONTRAST:  75mL OMNIPAQUE  IOHEXOL  350 MG/ML SOLN COMPARISON:  January 04, 2024. FINDINGS: Cardiovascular: Satisfactory opacification of the pulmonary arteries to the segmental level. No evidence of pulmonary embolism. Normal heart size. No pericardial effusion. Coronary artery calcifications are noted. Mediastinum/Nodes: No enlarged mediastinal, hilar, or axillary lymph nodes. Thyroid gland, trachea, and esophagus demonstrate no significant findings. Lungs/Pleura: No pneumothorax or pleural effusion is noted. Minimal bibasilar subsegmental atelectasis or scarring is noted. Mild emphysematous disease is noted. Upper Abdomen: No acute abnormality. Musculoskeletal: No chest wall abnormality. No acute or significant osseous findings. Review of the MIP images confirms the above findings. IMPRESSION: 1. No definite evidence of pulmonary embolus. 2. Coronary artery calcifications are noted suggesting coronary artery disease. 3. Minimal bibasilar subsegmental atelectasis or scarring is noted. 4. Emphysema. Emphysema (ICD10-J43.9). Electronically Signed   By: Lynwood Landy Raddle M.D.   On: 08/10/2024 19:12   DG Chest 2 View Result Date: 08/10/2024 CLINICAL DATA:  Tachycardia. EXAM: CHEST - 2 VIEW COMPARISON:  November 13, 2023. FINDINGS: The heart size and mediastinal contours are within normal limits. Both lungs are  clear. The visualized skeletal structures are unremarkable. IMPRESSION: No active cardiopulmonary disease. Electronically Signed   By: Lynwood Landy Raddle M.D.   On: 08/10/2024 15:41        Scheduled Meds:  apixaban   5 mg Oral BID   atorvastatin   80 mg Oral Daily   diltiazem   120 mg Oral Daily   melatonin  5 mg Oral QHS   montelukast   10 mg Oral QHS   pantoprazole   20 mg Oral Daily    tamsulosin   0.4 mg Oral Daily   Continuous Infusions:  diltiazem  (CARDIZEM ) infusion 15 mg/hr (08/11/24 0737)     LOS: 0 days     Devaughn KATHEE Ban, MD [facility]   If 7PM-7AM, please contact night-coverage [email or web address] Password [email or web address] 08/11/2024, 8:14 AM

## 2024-08-11 NOTE — ED Notes (Signed)
 This RN assisted pt with removing home clothes and placing them in bag. Pt has robe from home that he prefers to wear. This RN assisted with putting on robe and applying non skid socks. Pt very appreciative. Pt denies any pain or further needs at this time. Reports he wants to try and take a nap.

## 2024-08-12 DIAGNOSIS — I2510 Atherosclerotic heart disease of native coronary artery without angina pectoris: Secondary | ICD-10-CM | POA: Diagnosis not present

## 2024-08-12 DIAGNOSIS — E782 Mixed hyperlipidemia: Secondary | ICD-10-CM | POA: Diagnosis not present

## 2024-08-12 DIAGNOSIS — Z91148 Patient's other noncompliance with medication regimen for other reason: Secondary | ICD-10-CM

## 2024-08-12 DIAGNOSIS — I4892 Unspecified atrial flutter: Secondary | ICD-10-CM | POA: Diagnosis not present

## 2024-08-12 LAB — BASIC METABOLIC PANEL WITH GFR
Anion gap: 6 (ref 5–15)
BUN: 14 mg/dL (ref 8–23)
CO2: 29 mmol/L (ref 22–32)
Calcium: 8.3 mg/dL — ABNORMAL LOW (ref 8.9–10.3)
Chloride: 102 mmol/L (ref 98–111)
Creatinine, Ser: 0.94 mg/dL (ref 0.61–1.24)
GFR, Estimated: >60 mL/min (ref >60)
Glucose, Bld: 134 mg/dL — ABNORMAL HIGH (ref 70–99)
Potassium: 4.4 mmol/L (ref 3.5–5.1)
Sodium: 137 mmol/L (ref 135–145)

## 2024-08-12 MED ORDER — DILTIAZEM HCL ER COATED BEADS 120 MG PO CP24
240.0000 mg | ORAL_CAPSULE | Freq: Every day | ORAL | Status: DC
Start: 2024-08-12 — End: 2024-08-16
  Administered 2024-08-12 – 2024-08-16 (×5): 240 mg via ORAL
  Filled 2024-08-12 (×5): qty 2

## 2024-08-12 MED ORDER — LACTULOSE 10 GM/15ML PO SOLN
20.0000 g | Freq: Once | ORAL | Status: AC
  Administered 2024-08-12: 20 g via ORAL
  Filled 2024-08-12: qty 30

## 2024-08-12 NOTE — Progress Notes (Signed)
 PROGRESS NOTE    Bryan Kelley  FMW:[id number] DOB: 1956-05-25 DOA: 08/10/2024 PCP: [facility]     Brief Narrative:   Bryan Kelley is a 68 y.o. male with medical history significant of atrial flutter, hypertension, GERD, CAD, BPH, alcohol  use, glaucoma, subclinical hypothyroidism, asthma, COPD, chronic respiratory failure with hypoxia presenting with tachycardia from PCP.   Patient recently admitted 9/25-9/30.  Present with chest pain and possible STEMI.  STEMI was ruled out by cath showing nonobstructive CAD.  Patient was in new onset atrial flutter with RVR initially treated with IV diltiazem  and heparin  infusion which was transition to p.o. diltiazem  and Eliquis .  Patient also treated for COPD exacerbation and noted to have low TSH and is being followed for subclinical hyperthyroidism.   Patient had not felt too well since discharge and only picked up/filled his prescriptions yesterday which would be about a week without the new medications including diltiazem  and Eliquis .  Took 1 Eliquis  and had some nausea and did not take his other medication.  Has had some tachycardia at home in the last few days.  Was seen at [facility] today and heart rate noted to be around 160 so he was sent to the ED.   Reports dyspnea on exertion.  Denies fevers, chills, chest pain, abdominal pain, constipation, diarrhea.     Assessment & Plan:   Principal Problem:   Atrial flutter with rapid ventricular response, new onset(HCC) Active Problems:   CAD , nonobstructive on cath 07/28/2024   COPD (chronic obstructive pulmonary disease) (HCC)   Chronic hypoxemic respiratory failure (HCC)   Essential hypertension   BPH (benign prostatic hyperplasia)   Asthma   GERD (gastroesophageal reflux disease)   Alcohol  abuse   Personal history of tobacco use, presenting hazards to health   Glaucoma   GERD without esophagitis   Subclinical hyperthyroidism   Non compliance w medication regimen  #  A-fib/flutter with rvr Recent diagnosis, not compliant w/ d/c meds, overnight on admission on dilt gtt but remained in rvr, dig added and metop, now rate controlled, cardiology following - planning on tee/dccv on Monday if doesn't convert to sinus before then - continue apixaban  - weaning down dilt gtt, continuing oral dit and metop  # HTN Bps low normal - holding home losartan  - dilt/metop as above  # COPD Stable - home inhalers  # Non-obstructive CAD On recent cath - home atorva  # Chronic hypoxic respiratory failure Stable on home 3 liters - breo for home symbicort   # BPH - home flomax    DVT prophylaxis: therapeutic apixaban  Code Status: full Family Communication: none at bedside  Level of care: Progressive Status is: inpt    Consultants:  cardiology  Procedures: none  Antimicrobials:  none    Subjective: Reports feeling fine, no palpitations or chest pain, dyspnea is at baseline  Objective: Vitals:   08/11/24 2206 08/12/24 0044 08/12/24 0447 08/12/24 0710  BP: (!) 118/99 119/71 116/65 107/71  Pulse: 81 82 83 83  Resp: 18 18 18    Temp: 97.7 F (36.5 C) 98.9 F (37.2 C) 99 F (37.2 C)   TempSrc: Oral Oral Oral   SpO2: 100% 99% 100%   Weight:      Height:        Intake/Output Summary (Last 24 hours) at 08/12/2024 1218 Last data filed at 08/12/2024 1043 Gross per 24 hour  Intake 360 ml  Output 0 ml  Net 360 ml   Filed Weights   08/10/24  1449  Weight: 81.6 kg    Examination:  General exam: Appears calm and comfortable  Respiratory system: faint exp wheeze, normal wob Cardiovascular system: S1 & S2 heard, tachycardic, irreg Gastrointestinal system: Abdomen is nondistended, soft and nontender.   Central nervous system: Alert and oriented. No focal neurological deficits. Extremities: Symmetric 5 x 5 power. Skin: No rashes, lesions or ulcers Psychiatry: Judgement and insight appear normal. Mood & affect appropriate.     Data  Reviewed: I have personally reviewed following labs and imaging studies  CBC: Recent Labs  Lab 08/10/24 1456 08/11/24 0544  WBC 9.1 8.8  HGB 13.2 12.0*  HCT 40.6 37.2*  MCV 88.1 88.8  PLT 267 240   Basic Metabolic Panel: Recent Labs  Lab 08/10/24 1456 08/11/24 0544 08/12/24 0503  NA 139 138 137  K 4.7 4.1 4.4  CL 103 105 102  CO2 24 27 29   GLUCOSE 117* 134* 134*  BUN 14 17 14   CREATININE 0.85 1.07 0.94  CALCIUM  8.6* 8.3* 8.3*  MG 2.1  --   --    GFR: Estimated Creatinine Clearance: 76.5 mL/min (by C-G formula based on SCr of 0.94 mg/dL). Liver Function Tests: No results for input(s): AST, ALT, ALKPHOS, BILITOT, PROT, ALBUMIN in the last 168 hours. No results for input(s): LIPASE, AMYLASE in the last 168 hours. No results for input(s): AMMONIA in the last 168 hours. Coagulation Profile: Recent Labs  Lab 08/10/24 1456  INR 1.2   Cardiac Enzymes: No results for input(s): CKTOTAL, CKMB, CKMBINDEX, TROPONINI in the last 168 hours. BNP (last 3 results) No results for input(s): PROBNP in the last 8760 hours. HbA1C: No results for input(s): HGBA1C in the last 72 hours. CBG: No results for input(s): GLUCAP in the last 168 hours. Lipid Profile: No results for input(s): CHOL, HDL, LDLCALC, TRIG, CHOLHDL, LDLDIRECT in the last 72 hours. Thyroid Function Tests: Recent Labs    08/10/24 1456  TSH 0.683   Anemia Panel: No results for input(s): VITAMINB12, FOLATE, FERRITIN, TIBC, IRON, RETICCTPCT in the last 72 hours. Urine analysis: No results found for: COLORURINE, APPEARANCEUR, LABSPEC, PHURINE, GLUCOSEU, HGBUR, BILIRUBINUR, KETONESUR, PROTEINUR, UROBILINOGEN, NITRITE, LEUKOCYTESUR Sepsis Labs: @LABRCNTIP (procalcitonin:4,lacticidven:4)  )No results found for this or any previous visit (from the past 240 hours).       Radiology Studies: CT Angio Chest PE W and/or Wo Contrast Result  Date: 08/10/2024 CLINICAL DATA:  Tachycardia, elevated D-dimer.  Atrial flutter. EXAM: CT ANGIOGRAPHY CHEST WITH CONTRAST TECHNIQUE: Multidetector CT imaging of the chest was performed using the standard protocol during bolus administration of intravenous contrast. Multiplanar CT image reconstructions and MIPs were obtained to evaluate the vascular anatomy. RADIATION DOSE REDUCTION: This exam was performed according to the departmental dose-optimization program which includes automated exposure control, adjustment of the mA and/or kV according to patient size and/or use of iterative reconstruction technique. CONTRAST:  75mL OMNIPAQUE  IOHEXOL  350 MG/ML SOLN COMPARISON:  January 04, 2024. FINDINGS: Cardiovascular: Satisfactory opacification of the pulmonary arteries to the segmental level. No evidence of pulmonary embolism. Normal heart size. No pericardial effusion. Coronary artery calcifications are noted. Mediastinum/Nodes: No enlarged mediastinal, hilar, or axillary lymph nodes. Thyroid gland, trachea, and esophagus demonstrate no significant findings. Lungs/Pleura: No pneumothorax or pleural effusion is noted. Minimal bibasilar subsegmental atelectasis or scarring is noted. Mild emphysematous disease is noted. Upper Abdomen: No acute abnormality. Musculoskeletal: No chest wall abnormality. No acute or significant osseous findings. Review of the MIP images confirms the above findings. IMPRESSION: 1. No definite evidence  of pulmonary embolus. 2. Coronary artery calcifications are noted suggesting coronary artery disease. 3. Minimal bibasilar subsegmental atelectasis or scarring is noted. 4. Emphysema. Emphysema (ICD10-J43.9). Electronically Signed   By: Lynwood Landy Raddle M.D.   On: 08/10/2024 19:12   DG Chest 2 View Result Date: 08/10/2024 CLINICAL DATA:  Tachycardia. EXAM: CHEST - 2 VIEW COMPARISON:  November 13, 2023. FINDINGS: The heart size and mediastinal contours are within normal limits. Both lungs are clear.  The visualized skeletal structures are unremarkable. IMPRESSION: No active cardiopulmonary disease. Electronically Signed   By: Lynwood Landy Raddle M.D.   On: 08/10/2024 15:41        Scheduled Meds:  apixaban   5 mg Oral BID   atorvastatin   80 mg Oral Daily   brimonidine   1 drop Left Eye BID   brinzolamide   1 drop Left Eye BID   diltiazem   240 mg Oral Daily   fluticasone  furoate-vilanterol  1 puff Inhalation Daily   melatonin  5 mg Oral QHS   metoprolol tartrate  25 mg Oral Q6H   montelukast   10 mg Oral QHS   pantoprazole   20 mg Oral Daily   tamsulosin   0.4 mg Oral Daily   Continuous Infusions:  diltiazem  (CARDIZEM ) infusion 2.5 mg/hr (08/12/24 1120)     LOS: 1 day     Devaughn KATHEE Ban, MD [facility]   If 7PM-7AM, please contact night-coverage [email or web address] Password [email or web address] 08/12/2024, 12:18 PM

## 2024-08-12 NOTE — Progress Notes (Signed)
 Progress Note  Patient Name: Bryan Kelley Date of Encounter: 08/12/2024 [facility] Cardiologist: Alm Clay, MD   Interval Summary    He remains in Aflutter with controlled rates. He is overall feeling well.   Vital Signs Vitals:   08/11/24 2206 08/12/24 0044 08/12/24 0447 08/12/24 0710  BP: (!) 118/99 119/71 116/65 107/71  Pulse: 81 82 83 83  Resp: 18 18 18    Temp: 97.7 F (36.5 C) 98.9 F (37.2 C) 99 F (37.2 C)   TempSrc: Oral Oral Oral   SpO2: 100% 99% 100%   Weight:      Height:        Intake/Output Summary (Last 24 hours) at 08/12/2024 1049 Last data filed at 08/12/2024 1043 Gross per 24 hour  Intake 360 ml  Output 0 ml  Net 360 ml      08/10/2024    2:49 PM 07/28/2024    8:54 PM 07/04/2023   12:03 AM  Last 3 Weights  Weight (lbs) 180 lb 176 lb 5.9 oz 170 lb  Weight (kg) 81.647 kg 80 kg 77.111 kg      Telemetry/ECG   Afib/flutter HR 80s - Personally Reviewed  Physical Exam  GEN: No acute distress.   Neck: No JVD Cardiac: Reg Irreg, no murmurs, rubs, or gallops.  Respiratory: Clear to auscultation bilaterally. GI: Soft, nontender, non-distended  MS: No edema  Relevant CV Studies:   Echo 07/2024   1. Left ventricular ejection fraction, by estimation, is 65 to 70%. The  left ventricle has normal function. The left ventricle has no regional  wall motion abnormalities. Left ventricular diastolic parameters are  consistent with Grade I diastolic  dysfunction (impaired relaxation).   2. Right ventricular systolic function is normal. The right ventricular  size is normal.   3. The mitral valve is normal in structure. No evidence of mitral valve  regurgitation.   4. The aortic valve was not well visualized. Aortic valve regurgitation  is not visualized.   5. The inferior vena cava is normal in size with greater than 50%  respiratory variability, suggesting right atrial pressure of 3 mmHg.    LHC 07/2024    Mid LAD lesion is 50%  stenosed with 90% stenosed side branch in 2nd Diag.   Ost RCA to Prox RCA lesion is 30% stenosed. Prox RCA lesion is 30% stenosed. Prox RCA to Mid RCA lesion is 25% stenosed.   There is hyperdynamic left ventricular systolic function.  The left ventricular ejection fraction is greater than 65% by visual estimate. LV end diastolic pressure is severely elevated.   A-fib/flutter with RVR developed upon completion of procedure => flutter/fib waves noted with IV adenosine  12 mg => rate improved with 10 mg IV diltiazem    Dominance: Right      FINDINGS Diffuse moderate CAD: The most significant lesion being 90% stenosis of a tiny second diagonal branch associated with segmental eccentric 50% calcified LAD stenosis. Not flow-limiting.  LCx has 2 major OM branches and a small AV groove branch with this with small PL-no notable disease. RCA has tandem 30% proximal lesions and a 20% proximal to mid lesion with TIMI-3 flow down relatively small PDA with trivial posterolateral branches. Hyperdynamic LV significant ectopy with catheter insertion. EF estimated over 70%, and severely elevated LVEDP of 30 millimercury. Tachyarrhythmia originally suspected to be SVT but with 12 mg IV adenosine , this confirmed the presence of flutter/fib waves. He was then given 10 mg IV diltiazem  with improved heart  rate into the 110s.  => Likely A-fib RVR Suspect AECOPD exacerbation exacerbated by tachyarrhythmia.  This could explain his chest pain.  He had notable chest pain when tachycardic that improved with rate control.     RECOMMENDATIONS Admit to ICU under TRH service-[name], MD with [facility] and PCCM consultation Check 2D echocardiogram the morning and cycle troponin levels   He will be started on diltiazem  drip after 10 mg IV bolus-titrate for rate control and then convert to p.o. I have stopped his home amlodipine  and started losartan  50 mg daily; would not start aspirin given the plans for DOAC.  Reassess volume status in the morning, but he has already had 400 mL out after 40 mg IV Lasix  PCCM to assist TRH with management of a AECOPD Started high-dose statin based on diffuse CAD.   Recommend to resume Rivaroxaban, at currently prescribed dose and frequency.   Patient will be started on IV heparin  starting 2 hours after TR band removal and depending on timing/duration of his arrhythmia, could consider DOAC on discharge.  I think for ease of use, Xarelto may be a better option.      Assessment & Plan   Afib RVR - presented with tachycardia found to be in Afib/flutter with heart rates in the 150s - Eliquis  5mg  BID. He was not taking this PTA. He waited one week to pick up meds after d/c. He took one dose prior to admission - he was started on IV dilt 15mg /hr,  dilt 120mg  daily, IV digoxin - he is asymptomatic - prior echo with normal EF - he is down to 5mg /hr on IV dilt>try and wean. Increase oral dilt to 240mg  daily - continue Lopressor 25mg  Q6H - patient reports he will be compliant with Eliquis  and would like to pursue TEE/DCCV on Monday   Nonobstructive CAD - no chest pain reported - continue statin   HTN - ditiazem and metoprolol - losartan  held   COPD on 3L O2 - stable on 3L O2    For questions or updates, please contact [facility] Please consult [email or web address] for contact info under         Signed, Azhia Siefken VEAR Fishman, PA-C

## 2024-08-12 NOTE — Care Management Important Message (Signed)
 Important Message  Patient Details  Name: Bryan Kelley MRN: [id number] Date of Birth: 1956-04-23   Important Message Given:  Yes - Medicare IM     Bryan Kelley 08/12/2024, 4:07 PM

## 2024-08-13 DIAGNOSIS — J449 Chronic obstructive pulmonary disease, unspecified: Secondary | ICD-10-CM | POA: Diagnosis not present

## 2024-08-13 DIAGNOSIS — I2510 Atherosclerotic heart disease of native coronary artery without angina pectoris: Secondary | ICD-10-CM | POA: Diagnosis not present

## 2024-08-13 DIAGNOSIS — J9611 Chronic respiratory failure with hypoxia: Secondary | ICD-10-CM | POA: Diagnosis not present

## 2024-08-13 DIAGNOSIS — I4892 Unspecified atrial flutter: Secondary | ICD-10-CM | POA: Diagnosis not present

## 2024-08-13 LAB — BASIC METABOLIC PANEL WITH GFR
Anion gap: 6 (ref 5–15)
BUN: 13 mg/dL (ref 8–23)
CO2: 30 mmol/L (ref 22–32)
Calcium: 8.5 mg/dL — ABNORMAL LOW (ref 8.9–10.3)
Chloride: 101 mmol/L (ref 98–111)
Creatinine, Ser: 0.77 mg/dL (ref 0.61–1.24)
GFR, Estimated: >60 mL/min (ref >60)
Glucose, Bld: 156 mg/dL — ABNORMAL HIGH (ref 70–99)
Potassium: 4 mmol/L (ref 3.5–5.1)
Sodium: 137 mmol/L (ref 135–145)

## 2024-08-13 LAB — MAGNESIUM: Magnesium: 2 mg/dL (ref 1.7–2.4)

## 2024-08-13 MED ORDER — IPRATROPIUM-ALBUTEROL 0.5-2.5 (3) MG/3ML IN SOLN
3.0000 mL | Freq: Three times a day (TID) | RESPIRATORY_TRACT | Status: DC
  Administered 2024-08-13 – 2024-08-15 (×6): 3 mL via RESPIRATORY_TRACT
  Filled 2024-08-13 (×5): qty 3

## 2024-08-13 MED ORDER — PREDNISONE 20 MG PO TABS
40.0000 mg | ORAL_TABLET | Freq: Every day | ORAL | Status: DC
  Administered 2024-08-14 – 2024-08-16 (×3): 40 mg via ORAL
  Filled 2024-08-13 (×3): qty 2

## 2024-08-13 MED ORDER — METOPROLOL TARTRATE 50 MG PO TABS
50.0000 mg | ORAL_TABLET | Freq: Four times a day (QID) | ORAL | Status: DC
  Administered 2024-08-13 – 2024-08-16 (×11): 50 mg via ORAL
  Filled 2024-08-13 (×7): qty 1
  Filled 2024-08-13: qty 2
  Filled 2024-08-13 (×3): qty 1

## 2024-08-13 MED ORDER — METOPROLOL TARTRATE 25 MG PO TABS: 50.0000 mg | ORAL_TABLET | Freq: Four times a day (QID) | ORAL | Status: DC

## 2024-08-13 MED ORDER — IPRATROPIUM-ALBUTEROL 0.5-2.5 (3) MG/3ML IN SOLN: 3.0000 mL | Freq: Three times a day (TID) | RESPIRATORY_TRACT | Status: DC

## 2024-08-13 MED ORDER — LACTULOSE 10 GM/15ML PO SOLN
20.0000 g | Freq: Once | ORAL | Status: AC
  Administered 2024-08-13: 20 g via ORAL
  Filled 2024-08-13: qty 30

## 2024-08-13 NOTE — Progress Notes (Signed)
 PROGRESS NOTE    Bryan Kelley  FMW:[id number] DOB: 1956-10-30 DOA: 08/10/2024 PCP: [facility]     Brief Narrative:   Bryan Kelley is a 68 y.o. male with medical history significant of atrial flutter, hypertension, GERD, CAD, BPH, alcohol  use, glaucoma, subclinical hypothyroidism, asthma, COPD, chronic respiratory failure with hypoxia presenting with tachycardia from PCP.   Patient recently admitted 9/25-9/30.  Present with chest pain and possible STEMI.  STEMI was ruled out by cath showing nonobstructive CAD.  Patient was in new onset atrial flutter with RVR initially treated with IV diltiazem  and heparin  infusion which was transition to p.o. diltiazem  and Eliquis .  Patient also treated for COPD exacerbation and noted to have low TSH and is being followed for subclinical hyperthyroidism.   Patient had not felt too well since discharge and only picked up/filled his prescriptions yesterday which would be about a week without the new medications including diltiazem  and Eliquis .  Took 1 Eliquis  and had some nausea and did not take his other medication.  Has had some tachycardia at home in the last few days.  Was seen at [facility] today and heart rate noted to be around 160 so he was sent to the ED.   Reports dyspnea on exertion.  Denies fevers, chills, chest pain, abdominal pain, constipation, diarrhea.     Assessment & Plan:   Principal Problem:   Atrial flutter with rapid ventricular response, new onset(HCC) Active Problems:   CAD , nonobstructive on cath 07/28/2024   COPD (chronic obstructive pulmonary disease) (HCC)   Chronic hypoxemic respiratory failure (HCC)   Essential hypertension   BPH (benign prostatic hyperplasia)   Asthma   GERD (gastroesophageal reflux disease)   Alcohol  abuse   Personal history of tobacco use, presenting hazards to health   Glaucoma   GERD without esophagitis   Subclinical hyperthyroidism   Non compliance w medication regimen  #  A-fib/flutter with rvr Recent diagnosis, not compliant w/ d/c meds, overnight on admission on dilt gtt but remained in rvr, dig added and metop, now rate controlled, cardiology following - planning on tee/dccv on Monday if doesn't convert to sinus before then - continue apixaban  - continuing oral dit and metop  # HTN Bps low normal - holding home losartan  - dilt/metop as above  # COPD With chest tightness and wheeze today. CT few days ago nothing acute. Refuses breo for home symbicort  - duonebs standing - start prednisone  40 every day - respiratory swab  # Non-obstructive CAD On recent cath. asymptomatic - home atorva  # Chronic hypoxic respiratory failure Stable on home 3 liters  # BPH - home flomax    DVT prophylaxis: therapeutic apixaban  Code Status: full Family Communication: none at bedside  Level of care: Progressive Status is: inpt    Consultants:  cardiology  Procedures: TEE DCCV pending  Antimicrobials:  none    Subjective: Reports chest tightness  Objective: Vitals:   08/13/24 0012 08/13/24 0438 08/13/24 0856 08/13/24 1135  BP: 94/63 116/71 113/71 (!) 124/97  Pulse: 83 83 83 98  Resp: 16 20  17   Temp: 97.9 F (36.6 C) 98.2 F (36.8 C) 98.9 F (37.2 C) 98.5 F (36.9 C)  TempSrc:   Oral Oral  SpO2: 100% 100% 100% 100%  Weight:      Height:        Intake/Output Summary (Last 24 hours) at 08/13/2024 1239 Last data filed at 08/13/2024 1138 Gross per 24 hour  Intake 800.9 ml  Output  300 ml  Net 500.9 ml   Filed Weights   08/10/24 1449  Weight: 81.6 kg    Examination:  General exam: Appears calm and comfortable  Respiratory system: faint exp wheeze, normal wob Cardiovascular system: S1 & S2 heard, tachycardic, irreg Gastrointestinal system: Abdomen is nondistended, soft and nontender.   Central nervous system: Alert and oriented. No focal neurological deficits. Extremities: Symmetric 5 x 5 power. Skin: No rashes, lesions or  ulcers Psychiatry: Judgement and insight appear normal. Mood & affect appropriate.     Data Reviewed: I have personally reviewed following labs and imaging studies  CBC: Recent Labs  Lab 08/10/24 1456 08/11/24 0544  WBC 9.1 8.8  HGB 13.2 12.0*  HCT 40.6 37.2*  MCV 88.1 88.8  PLT 267 240   Basic Metabolic Panel: Recent Labs  Lab 08/10/24 1456 08/11/24 0544 08/12/24 0503 08/13/24 0336  NA 139 138 137 137  K 4.7 4.1 4.4 4.0  CL 103 105 102 101  CO2 24 27 29 30   GLUCOSE 117* 134* 134* 156*  BUN 14 17 14 13   CREATININE 0.85 1.07 0.94 0.77  CALCIUM  8.6* 8.3* 8.3* 8.5*  MG 2.1  --   --  2.0   GFR: Estimated Creatinine Clearance: 89.9 mL/min (by C-G formula based on SCr of 0.77 mg/dL). Liver Function Tests: No results for input(s): AST, ALT, ALKPHOS, BILITOT, PROT, ALBUMIN in the last 168 hours. No results for input(s): LIPASE, AMYLASE in the last 168 hours. No results for input(s): AMMONIA in the last 168 hours. Coagulation Profile: Recent Labs  Lab 08/10/24 1456  INR 1.2   Cardiac Enzymes: No results for input(s): CKTOTAL, CKMB, CKMBINDEX, TROPONINI in the last 168 hours. BNP (last 3 results) No results for input(s): PROBNP in the last 8760 hours. HbA1C: No results for input(s): HGBA1C in the last 72 hours. CBG: No results for input(s): GLUCAP in the last 168 hours. Lipid Profile: No results for input(s): CHOL, HDL, LDLCALC, TRIG, CHOLHDL, LDLDIRECT in the last 72 hours. Thyroid Function Tests: Recent Labs    08/10/24 1456  TSH 0.683   Anemia Panel: No results for input(s): VITAMINB12, FOLATE, FERRITIN, TIBC, IRON, RETICCTPCT in the last 72 hours. Urine analysis: No results found for: COLORURINE, APPEARANCEUR, LABSPEC, PHURINE, GLUCOSEU, HGBUR, BILIRUBINUR, KETONESUR, PROTEINUR, UROBILINOGEN, NITRITE, LEUKOCYTESUR Sepsis Labs: @LABRCNTIP (procalcitonin:4,lacticidven:4)  )No  results found for this or any previous visit (from the past 240 hours).       Radiology Studies: No results found.       Scheduled Meds:  apixaban   5 mg Oral BID   atorvastatin   80 mg Oral Daily   brimonidine   1 drop Left Eye BID   brinzolamide   1 drop Left Eye BID   diltiazem   240 mg Oral Daily   fluticasone  furoate-vilanterol  1 puff Inhalation Daily   ipratropium-albuterol   3 mL Nebulization TID   melatonin  5 mg Oral QHS   metoprolol tartrate  25 mg Oral Q6H   montelukast   10 mg Oral QHS   pantoprazole   20 mg Oral Daily   [START ON 08/14/2024] predniSONE   40 mg Oral Q breakfast   tamsulosin   0.4 mg Oral Daily   Continuous Infusions:     LOS: 2 days     Devaughn KATHEE Ban, MD [facility]   If 7PM-7AM, please contact night-coverage [email or web address] Password [facility] 08/13/2024, 12:39 PM

## 2024-08-13 NOTE — Plan of Care (Signed)

## 2024-08-13 NOTE — Plan of Care (Signed)
  Problem: Education: Goal: Knowledge of General Education information will improve Description: Including pain rating scale, medication(s)/side effects and non-pharmacologic comfort measures Outcome: Progressing   Problem: Health Behavior/Discharge Planning: Goal: Ability to manage health-related needs will improve Outcome: Progressing   Problem: Clinical Measurements: Goal: Ability to maintain clinical measurements within normal limits will improve Outcome: Progressing   Problem: Clinical Measurements: Goal: Will remain free from infection Outcome: Progressing   Problem: Clinical Measurements: Goal: Respiratory complications will improve Outcome: Progressing   

## 2024-08-13 NOTE — Progress Notes (Signed)
 Progress Note  Patient Name: Bryan Kelley Date of Encounter: 08/13/2024 [facility] Cardiologist: Alm Clay, MD   Interval Summary    Still in Aflutter with HR in the 80s. He is overall stable with no chest pain or palpitations. Said he had some SOB overnight and was given a duoneb with improvement.   Vital Signs Vitals:   08/12/24 1600 08/12/24 2037 08/13/24 0012 08/13/24 0438  BP: 102/79 114/77 94/63 116/71  Pulse: 81 83 83 83  Resp: (!) 22 20 16 20   Temp: 98.8 F (37.1 C) 98 F (36.7 C) 97.9 F (36.6 C) 98.2 F (36.8 C)  TempSrc:      SpO2: 100% 100% 100% 100%  Weight:      Height:        Intake/Output Summary (Last 24 hours) at 08/13/2024 0746 Last data filed at 08/13/2024 0400 Gross per 24 hour  Intake 1040.9 ml  Output --  Net 1040.9 ml      08/10/2024    2:49 PM 07/28/2024    8:54 PM 07/04/2023   12:03 AM  Last 3 Weights  Weight (lbs) 180 lb 176 lb 5.9 oz 170 lb  Weight (kg) 81.647 kg 80 kg 77.111 kg      Telemetry/ECG  Aflutter HR 80s - Personally Reviewed  Physical Exam  GEN: No acute distress.   Neck: No JVD Cardiac: Reg Irreg, no murmurs, rubs, or gallops.  Respiratory: Clear to auscultation bilaterally. GI: Soft, nontender, non-distended  MS: No edema  Relevant CV Studies:   Echo 07/2024   1. Left ventricular ejection fraction, by estimation, is 65 to 70%. The  left ventricle has normal function. The left ventricle has no regional  wall motion abnormalities. Left ventricular diastolic parameters are  consistent with Grade I diastolic  dysfunction (impaired relaxation).   2. Right ventricular systolic function is normal. The right ventricular  size is normal.   3. The mitral valve is normal in structure. No evidence of mitral valve  regurgitation.   4. The aortic valve was not well visualized. Aortic valve regurgitation  is not visualized.   5. The inferior vena cava is normal in size with greater than 50%  respiratory  variability, suggesting right atrial pressure of 3 mmHg.    LHC 07/2024    Mid LAD lesion is 50% stenosed with 90% stenosed side branch in 2nd Diag.   Ost RCA to Prox RCA lesion is 30% stenosed. Prox RCA lesion is 30% stenosed. Prox RCA to Mid RCA lesion is 25% stenosed.   There is hyperdynamic left ventricular systolic function.  The left ventricular ejection fraction is greater than 65% by visual estimate. LV end diastolic pressure is severely elevated.   A-fib/flutter with RVR developed upon completion of procedure => flutter/fib waves noted with IV adenosine  12 mg => rate improved with 10 mg IV diltiazem    Dominance: Right      FINDINGS Diffuse moderate CAD: The most significant lesion being 90% stenosis of a tiny second diagonal branch associated with segmental eccentric 50% calcified LAD stenosis. Not flow-limiting.  LCx has 2 major OM branches and a small AV groove branch with this with small PL-no notable disease. RCA has tandem 30% proximal lesions and a 20% proximal to mid lesion with TIMI-3 flow down relatively small PDA with trivial posterolateral branches. Hyperdynamic LV significant ectopy with catheter insertion. EF estimated over 70%, and severely elevated LVEDP of 30 millimercury. Tachyarrhythmia originally suspected to be SVT but with 12 mg  IV adenosine , this confirmed the presence of flutter/fib waves. He was then given 10 mg IV diltiazem  with improved heart rate into the 110s.  => Likely A-fib RVR Suspect AECOPD exacerbation exacerbated by tachyarrhythmia.  This could explain his chest pain.  He had notable chest pain when tachycardic that improved with rate control.     RECOMMENDATIONS Admit to ICU under TR[facility] service-[name], MD with [facility] and PCCM consultation Check 2D echocardiogram the morning and cycle troponin levels   He will be started on diltiazem  drip after 10 mg IV bolus-titrate for rate control and then convert to p.o. I have stopped his  home amlodipine  and started losartan  50 mg daily; would not start aspirin given the plans for DOAC. Reassess volume status in the morning, but he has already had 400 mL out after 40 mg IV Lasix  PCCM to assist TRH with management of a AECOPD Started high-dose statin based on diffuse CAD.   Recommend to resume Rivaroxaban, at currently prescribed dose and frequency.   Patient will be started on IV heparin  starting 2 hours after TR band removal and depending on timing/duration of his arrhythmia, could consider DOAC on discharge.  I think for ease of use, Xarelto may be a better option.    Assessment & Plan   Afib RVR - presented with tachycardia found to be in Afib/flutter with heart rates in the 150s - PTA he was not taking Eliquis  5mg  BID regularly. He waited one week to pick up meds after d/c. He took one dose prior to admission - he was started on IV dilt 15mg /hr,  dilt 120mg  daily, IV digoxin with improvement of rates - he remains in Aflutter with rates in the 80s - he is asymptomatic - IV dilt d/c'd. Continue oral dilt to 240mg  daily and lopressor 25mg  Q6H - prior echo with normal EF - patient reports he will be compliant with Eliquis  and would like to pursue TEE/DCCV on Monday   Nonobstructive CAD - no chest pain reported - continue statin   HTN - ditiazem and metoprolol - losartan  held   COPD on 3L O2 - stable on 3L O2    For questions or updates, please contact [facility] Please consult [email or web address] for contact info under         Signed, Haydon Dorris VEAR Fishman, PA-C

## 2024-08-14 DIAGNOSIS — I4892 Unspecified atrial flutter: Secondary | ICD-10-CM | POA: Diagnosis not present

## 2024-08-14 DIAGNOSIS — I2510 Atherosclerotic heart disease of native coronary artery without angina pectoris: Secondary | ICD-10-CM | POA: Diagnosis not present

## 2024-08-14 DIAGNOSIS — I10 Essential (primary) hypertension: Secondary | ICD-10-CM | POA: Diagnosis not present

## 2024-08-14 DIAGNOSIS — Z9861 Coronary angioplasty status: Secondary | ICD-10-CM | POA: Diagnosis not present

## 2024-08-14 LAB — RESP PANEL BY RT-PCR (RSV, FLU A&B, COVID)  RVPGX2
Influenza A by PCR: NEGATIVE
Influenza B by PCR: NEGATIVE
Resp Syncytial Virus by PCR: NEGATIVE
SARS Coronavirus 2 by RT PCR: NEGATIVE

## 2024-08-14 LAB — RESPIRATORY PANEL BY PCR

## 2024-08-14 NOTE — Plan of Care (Signed)

## 2024-08-14 NOTE — Progress Notes (Signed)
 PROGRESS NOTE    Bryan Kelley  FMW:[id number] DOB: December 09, 1955 DOA: 08/10/2024 PCP: [facility]     Brief Narrative:   Bryan Kelley is a 68 y.o. male with medical history significant of atrial flutter, hypertension, GERD, CAD, BPH, alcohol  use, glaucoma, subclinical hypothyroidism, asthma, COPD, chronic respiratory failure with hypoxia presenting with tachycardia from PCP.   Patient recently admitted 9/25-9/30.  Present with chest pain and possible STEMI.  STEMI was ruled out by cath showing nonobstructive CAD.  Patient was in new onset atrial flutter with RVR initially treated with IV diltiazem  and heparin  infusion which was transition to p.o. diltiazem  and Eliquis .  Patient also treated for COPD exacerbation and noted to have low TSH and is being followed for subclinical hyperthyroidism.   Patient had not felt too well since discharge and only picked up/filled his prescriptions yesterday which would be about a week without the new medications including diltiazem  and Eliquis .  Took 1 Eliquis  and had some nausea and did not take his other medication.  Has had some tachycardia at home in the last few days.  Was seen at [facility] today and heart rate noted to be around 160 so he was sent to the ED.   Reports dyspnea on exertion.  Denies fevers, chills, chest pain, abdominal pain, constipation, diarrhea.     Assessment & Plan:   Principal Problem:   Atrial flutter with rapid ventricular response, new onset(HCC) Active Problems:   CAD , nonobstructive on cath 07/28/2024   COPD (chronic obstructive pulmonary disease) (HCC)   Chronic hypoxemic respiratory failure (HCC)   Essential hypertension   BPH (benign prostatic hyperplasia)   Asthma   GERD (gastroesophageal reflux disease)   Alcohol  abuse   Personal history of tobacco use, presenting hazards to health   Glaucoma   GERD without esophagitis   Subclinical hyperthyroidism   Non compliance w medication regimen  #  A-fib/flutter with rvr Recent diagnosis, not compliant w/ d/c meds, overnight on admission on dilt gtt but remained in rvr, dig added and metop, now rate controlled, cardiology following - planning on tee/dccv tomorrow, currently in flutter - continue apixaban  - continuing oral dit and metop  # HTN Bps low normal - holding home losartan  - dilt/metop as above  # COPD With chest tightness and wheeze 10/11. CT few days ago nothing acute. Refuses breo for home symbicort . Respiratory panels negative. Improving today with oral steroids and breathing treatments - duonebs standing - start prednisone  40 every day  # Non-obstructive CAD On recent cath. asymptomatic - home atorva  # Chronic hypoxic respiratory failure Stable on home 3 liters  # BPH - home flomax    DVT prophylaxis: therapeutic apixaban  Code Status: full Family Communication: none at bedside  Level of care: Progressive Status is: inpt    Consultants:  cardiology  Procedures: TEE DCCV pending  Antimicrobials:  none    Subjective: Reports chest tightness improved, no pain  Objective: Vitals:   08/13/24 2019 08/13/24 2331 08/14/24 0417 08/14/24 0750  BP: 104/68 106/62 125/62   Pulse: 79 79 80   Resp: 18 18 17    Temp: 98.1 F (36.7 C) 98.5 F (36.9 C) (!) 97.5 F (36.4 C)   TempSrc:  Oral    SpO2: 100% 92% 100% 100%  Weight:      Height:        Intake/Output Summary (Last 24 hours) at 08/14/2024 1207 Last data filed at 08/13/2024 1300 Gross per 24 hour  Intake 120  ml  Output --  Net 120 ml   Filed Weights   08/10/24 1449  Weight: 81.6 kg    Examination:  General exam: Appears calm and comfortable  Respiratory system: no wheeze, normal wob Cardiovascular system: S1 & S2 heard, tachycardic, irreg Gastrointestinal system: Abdomen is nondistended, soft and nontender.   Central nervous system: Alert and oriented. No focal neurological deficits. Extremities: Symmetric 5 x 5 power. Skin:  No rashes, lesions or ulcers Psychiatry: Judgement and insight appear normal. Mood & affect appropriate.     Data Reviewed: I have personally reviewed following labs and imaging studies  CBC: Recent Labs  Lab 08/10/24 1456 08/11/24 0544  WBC 9.1 8.8  HGB 13.2 12.0*  HCT 40.6 37.2*  MCV 88.1 88.8  PLT 267 240   Basic Metabolic Panel: Recent Labs  Lab 08/10/24 1456 08/11/24 0544 08/12/24 0503 08/13/24 0336  NA 139 138 137 137  K 4.7 4.1 4.4 4.0  CL 103 105 102 101  CO2 24 27 29 30   GLUCOSE 117* 134* 134* 156*  BUN 14 17 14 13   CREATININE 0.85 1.07 0.94 0.77  CALCIUM  8.6* 8.3* 8.3* 8.5*  MG 2.1  --   --  2.0   GFR: Estimated Creatinine Clearance: 89.9 mL/min (by C-G formula based on SCr of 0.77 mg/dL). Liver Function Tests: No results for input(s): AST, ALT, ALKPHOS, BILITOT, PROT, ALBUMIN in the last 168 hours. No results for input(s): LIPASE, AMYLASE in the last 168 hours. No results for input(s): AMMONIA in the last 168 hours. Coagulation Profile: Recent Labs  Lab 08/10/24 1456  INR 1.2   Cardiac Enzymes: No results for input(s): CKTOTAL, CKMB, CKMBINDEX, TROPONINI in the last 168 hours. BNP (last 3 results) No results for input(s): PROBNP in the last 8760 hours. HbA1C: No results for input(s): HGBA1C in the last 72 hours. CBG: No results for input(s): GLUCAP in the last 168 hours. Lipid Profile: No results for input(s): CHOL, HDL, LDLCALC, TRIG, CHOLHDL, LDLDIRECT in the last 72 hours. Thyroid Function Tests: No results for input(s): TSH, T4TOTAL, FREET4, T3FREE, THYROIDAB in the last 72 hours.  Anemia Panel: No results for input(s): VITAMINB12, FOLATE, FERRITIN, TIBC, IRON, RETICCTPCT in the last 72 hours. Urine analysis: No results found for: COLORURINE, APPEARANCEUR, LABSPEC, PHURINE, GLUCOSEU, HGBUR, BILIRUBINUR, KETONESUR, PROTEINUR, UROBILINOGEN, NITRITE,  LEUKOCYTESUR Sepsis Labs: @LABRCNTIP (procalcitonin:4,lacticidven:4)  ) Recent Results (from the past 240 hours)  Respiratory (~20 pathogens) panel by PCR     Status: None   Collection Time: 08/13/24  8:14 PM   Specimen: Nasopharyngeal Swab; Respiratory  Result Value Ref Range Status   Adenovirus NOT DETECTED NOT DETECTED Final   Coronavirus 229E NOT DETECTED NOT DETECTED Final    Comment: (NOTE) The Coronavirus on the Respiratory Panel, DOES NOT test for the novel  Coronavirus (2019 nCoV)    Coronavirus HKU1 NOT DETECTED NOT DETECTED Final   Coronavirus NL63 NOT DETECTED NOT DETECTED Final   Coronavirus OC43 NOT DETECTED NOT DETECTED Final   Metapneumovirus NOT DETECTED NOT DETECTED Final   Rhinovirus / Enterovirus NOT DETECTED NOT DETECTED Final   Influenza A NOT DETECTED NOT DETECTED Final   Influenza B NOT DETECTED NOT DETECTED Final   Parainfluenza Virus 1 NOT DETECTED NOT DETECTED Final   Parainfluenza Virus 2 NOT DETECTED NOT DETECTED Final   Parainfluenza Virus 3 NOT DETECTED NOT DETECTED Final   Parainfluenza Virus 4 NOT DETECTED NOT DETECTED Final   Respiratory Syncytial Virus NOT DETECTED NOT DETECTED Final   Bordetella  pertussis NOT DETECTED NOT DETECTED Final   Bordetella Parapertussis NOT DETECTED NOT DETECTED Final   Chlamydophila pneumoniae NOT DETECTED NOT DETECTED Final   Mycoplasma pneumoniae NOT DETECTED NOT DETECTED Final    Comment: Performed at [facility], [address] [place], [place] [address]         Radiology Studies: No results found.       Scheduled Meds:  apixaban   5 mg Oral BID   atorvastatin   80 mg Oral Daily   brimonidine   1 drop Left Eye BID   brinzolamide   1 drop Left Eye BID   diltiazem   240 mg Oral Daily   fluticasone  furoate-vilanterol  1 puff Inhalation Daily   ipratropium-albuterol   3 mL Nebulization TID   melatonin  5 mg Oral QHS   metoprolol tartrate  50 mg Oral Q6H   montelukast   10 mg Oral QHS    pantoprazole   20 mg Oral Daily   predniSONE   40 mg Oral Q breakfast   tamsulosin   0.4 mg Oral Daily   Continuous Infusions:     LOS: 3 days     Devaughn KATHEE Ban, MD [facility]   If 7PM-7AM, please contact night-coverage [email or web address] Password [email or web address] 08/14/2024, 12:07 PM

## 2024-08-14 NOTE — Plan of Care (Signed)

## 2024-08-14 NOTE — Progress Notes (Signed)
 Cardiology Progress Note  Patient ID: Bryan Kelley MRN: [id number] DOB: 1956/04/12 Date of Encounter: 08/14/2024 Primary Cardiologist: Alm Clay, MD  Subjective   Chief Complaint: None.   HPI: Remains in atrial flutter.  Shortness of breath improved.  ROS:  All other ROS reviewed and negative. Pertinent positives noted in the HPI.     Telemetry  Overnight telemetry shows atrial flutter heart rate 100 to 120 bpm, which I personally reviewed.    Physical Exam   Vitals:   08/13/24 2019 08/13/24 2331 08/14/24 0417 08/14/24 0750  BP: 104/68 106/62 125/62   Pulse: 79 79 80   Resp: 18 18 17    Temp: 98.1 F (36.7 C) 98.5 F (36.9 C) (!) 97.5 F (36.4 C)   TempSrc:  Oral    SpO2: 100% 92% 100% 100%  Weight:      Height:        Intake/Output Summary (Last 24 hours) at 08/14/2024 1213 Last data filed at 08/13/2024 1300 Gross per 24 hour  Intake 120 ml  Output --  Net 120 ml       08/10/2024    2:49 PM 07/28/2024    8:54 PM 07/04/2023   12:03 AM  Last 3 Weights  Weight (lbs) 180 lb 176 lb 5.9 oz 170 lb  Weight (kg) 81.647 kg 80 kg 77.111 kg    Body mass index is 29.05 kg/m.  General: Well nourished, well developed, in no acute distress Head: Atraumatic, normal size  Eyes: PEERLA, EOMI  Neck: Supple, no JVD Endocrine: No thryomegaly Cardiac: Normal S1, S2; irregular rhythm, no murmurs Lungs: Rhonchi bilaterally Abd: Soft, nontender, no hepatomegaly  Ext: No edema, pulses 2+ Musculoskeletal: No deformities, BUE and BLE strength normal and equal Skin: Warm and dry, no rashes   Neuro: Alert and oriented to person, place, time, and situation, CNII-XII grossly intact, no focal deficits  Psych: Normal mood and affect   Cardiac Studies  TTE 07/29/2024  1. Left ventricular ejection fraction, by estimation, is 65 to 70%. The  left ventricle has normal function. The left ventricle has no regional  wall motion abnormalities. Left ventricular diastolic parameters are   consistent with Grade I diastolic  dysfunction (impaired relaxation).   2. Right ventricular systolic function is normal. The right ventricular  size is normal.   3. The mitral valve is normal in structure. No evidence of mitral valve  regurgitation.   4. The aortic valve was not well visualized. Aortic valve regurgitation  is not visualized.   5. The inferior vena cava is normal in size with greater than 50%  respiratory variability, suggesting right atrial pressure of 3 mmHg.   LHC 925/2025 Diffuse moderate CAD: The most significant lesion being 90% stenosis of a tiny second diagonal branch associated with segmental eccentric 50% calcified LAD stenosis. Not flow-limiting.  LCx has 2 major OM branches and a small AV groove branch with this with small PL-no notable disease. RCA has tandem 30% proximal lesions and a 20% proximal to mid lesion with TIMI-3 flow down relatively small PDA with trivial posterolateral branches. Hyperdynamic LV significant ectopy with catheter insertion. EF estimated over 70%, and severely elevated LVEDP of 30 millimercury. Tachyarrhythmia originally suspected to be SVT but with 12 mg IV adenosine , this confirmed the presence of flutter/fib waves. He was then given 10 mg IV diltiazem  with improved heart rate into the 110s.  => Likely A-fib RVR Suspect AECOPD exacerbation exacerbated by tachyarrhythmia.  This could explain his chest pain.  He had notable chest pain when tachycardic that improved with rate control.   Patient Profile  Bryan Kelley is a 68 y.o. male with COPD, chronic respiratory failure on home oxygen , atrial flutter hypertension, CAD admitted on 08/12/2024 with atrial flutter with RVR.  Assessment & Plan   # Atrial flutter with RVR - Plan for TEE/cardioversion tomorrow.  N.p.o. at midnight. - Continue metoprolol to tartrate 50 mg Q6 hourly and diltiazem  240 mg daily.  If rates remain uncontrolled can increase as needed medication. - Continue  Eliquis  5 mg twice daily  Informed Consent   Shared Decision Making/Informed Consent   The risks [stroke, cardiac arrhythmias rarely resulting in the need for a temporary or permanent pacemaker, skin irritation or burns, esophageal damage, perforation (1:10,000 risk), bleeding, pharyngeal hematoma as well as other potential complications associated with conscious sedation including aspiration, arrhythmia, respiratory failure and death], benefits (treatment guidance, restoration of normal sinus rhythm, diagnostic support) and alternatives of a transesophageal echocardiogram guided cardioversion were discussed in detail with Bryan Kelley and he is willing to proceed.     # CAD - Recent admission with chest pain and atrial flutter.  Cath showed 90% diagonal lesion that was managed medically. - No angina. - On beta-blocker and Lipitor .  # COPD # Chronic respiratory failure - Lungs improved today.  Per hospital medicine.      For questions or updates, please contact [facility] Please consult [email or web address] for contact info under         Signed, Darryle T. Barbaraann, MD, [facility]  [facility]  08/14/2024 12:13 PM

## 2024-08-15 DIAGNOSIS — I4892 Unspecified atrial flutter: Secondary | ICD-10-CM | POA: Diagnosis not present

## 2024-08-15 MED ORDER — IPRATROPIUM-ALBUTEROL 0.5-2.5 (3) MG/3ML IN SOLN
3.0000 mL | Freq: Four times a day (QID) | RESPIRATORY_TRACT | Status: DC | PRN
  Administered 2024-08-15 – 2024-08-16 (×4): 3 mL via RESPIRATORY_TRACT
  Filled 2024-08-15 (×4): qty 3

## 2024-08-15 MED ORDER — MAGNESIUM CITRATE PO SOLN
1.0000 | Freq: Once | ORAL | Status: AC
  Administered 2024-08-15: 1 via ORAL
  Filled 2024-08-15: qty 296

## 2024-08-15 NOTE — Progress Notes (Signed)
  Progress Note  Patient Name: DAVAN NAWABI Date of Encounter: 08/15/2024 [facility] Cardiologist: Alm Clay, MD   Interval Summary   Felt short of breath late last night corresponding to atrial flutter with 2:1 AV block and heart rates around 150 bpm.  He feels better this morning.  He denies chest pain and palpitations.  Vital Signs Vitals:   08/15/24 1000 08/15/24 1144 08/15/24 1320 08/15/24 1526  BP:  (!) 128/102 112/75 101/66  Pulse:  (!) 107  76  Resp: 20 18 (!) 24 16  Temp:  98 F (36.7 C)  97.8 F (36.6 C)  TempSrc:  Oral    SpO2:  94%  100%  Weight:      Height:        Intake/Output Summary (Last 24 hours) at 08/15/2024 1601 Last data filed at 08/15/2024 1414 Gross per 24 hour  Intake 1260 ml  Output --  Net 1260 ml      08/10/2024    2:49 PM 07/28/2024    8:54 PM 07/04/2023   12:03 AM  Last 3 Weights  Weight (lbs) 180 lb 176 lb 5.9 oz 170 lb  Weight (kg) 81.647 kg 80 kg 77.111 kg      Telemetry/ECG  Atrial flutter with variable AV block; periods of 2:1 AV block noted overnight - Personally Reviewed  Physical Exam  GEN: No acute distress.   Neck: No JVD Cardiac: Irregular rhythm without murmurs. Respiratory: Breath sounds with bilateral wheezes. GI: Soft, nontender, non-distended  MS: No edema  Assessment & Plan  Atrial flutter: Overall, ventricular rate control is better though Mr. Matsuo continues to have spikes of heart rates up to 150 bpm with associated dyspnea.  I agree that he would benefit from restoration of sinus rhythm with TEE-guided cardioversion.  This has been scheduled for tomorrow based on anesthesia availability.  In the meantime, continue current regimen of apixaban , diltiazem , and metoprolol tartrate.  COPD exacerbation: Significant wheezing still noted on exam today.  Continue steroids per primary team as well as inhaled therapies.  Coronary artery disease: Recent catheterization showed 90% D2 stenosis and  mild-moderate nonobstructive disease involving the LAD and RCA.  Medical management recommended.  Continue apixaban  and lieu of aspirin and atorvastatin  80 mg daily.   Informed Consent   Shared Decision Making/Informed Consent   The risks [stroke, cardiac arrhythmias rarely resulting in the need for a temporary or permanent pacemaker, skin irritation or burns, esophageal damage, perforation (1:10,000 risk), bleeding, pharyngeal hematoma as well as other potential complications associated with conscious sedation including aspiration, arrhythmia, respiratory failure and death], benefits (treatment guidance, restoration of normal sinus rhythm, diagnostic support) and alternatives of a transesophageal echocardiogram guided cardioversion were discussed in detail with Mr. Hilgert and he is willing to proceed.    For questions or updates, please contact [facility] Please consult [email or web address] for contact info under [facility].  Signed, Lonni Hanson, MD

## 2024-08-15 NOTE — Plan of Care (Signed)

## 2024-08-15 NOTE — Progress Notes (Signed)
 Consent  for TEE signed by patient no concerns noted at this time. Consent in chart.

## 2024-08-15 NOTE — Progress Notes (Signed)
 PROGRESS NOTE    [name]  FMW:[id number] DOB: Nov 13, 1955 DOA: 08/10/2024 PCP: [facility]     Brief Narrative:   Bryan Kelley is a 68 y.o. male with medical history significant of atrial flutter, hypertension, GERD, CAD, BPH, alcohol  use, glaucoma, subclinical hypothyroidism, asthma, COPD, chronic respiratory failure with hypoxia presenting with tachycardia from PCP.   Patient recently admitted 9/25-9/30.  Present with chest pain and possible STEMI.  STEMI was ruled out by cath showing nonobstructive CAD.  Patient was in new onset atrial flutter with RVR initially treated with IV diltiazem  and heparin  infusion which was transition to p.o. diltiazem  and Eliquis .  Patient also treated for COPD exacerbation and noted to have low TSH and is being followed for subclinical hyperthyroidism.   Patient had not felt too well since discharge and only picked up/filled his prescriptions yesterday which would be about a week without the new medications including diltiazem  and Eliquis .  Took 1 Eliquis  and had some nausea and did not take his other medication.  Has had some tachycardia at home in the last few days.  Was seen at [facility] today and heart rate noted to be around 160 so he was sent to the ED.   Reports dyspnea on exertion.  Denies fevers, chills, chest pain, abdominal pain, constipation, diarrhea.     Assessment & Plan:   Principal Problem:   Atrial flutter with rapid ventricular response, new onset(HCC) Active Problems:   CAD , nonobstructive on cath 07/28/2024   COPD (chronic obstructive pulmonary disease) (HCC)   Chronic hypoxemic respiratory failure (HCC)   Essential hypertension   BPH (benign prostatic hyperplasia)   Asthma   GERD (gastroesophageal reflux disease)   Alcohol  abuse   Personal history of tobacco use, presenting hazards to health   Glaucoma   GERD without esophagitis   Subclinical hyperthyroidism   Non compliance w medication regimen  #  A-fib/flutter with rvr Recent diagnosis, not compliant w/ d/c meds, overnight on admission on dilt gtt but remained in rvr, dig added and metop, now rate controlled, cardiology following - planning on tee/dccv tomorrow, currently in flutter. No anesthesiology to perform today. - continue apixaban  - continuing oral dit and metop  # HTN Bps low normal - holding home losartan  - dilt/metop as above  # COPD With chest tightness and wheeze 10/11. CT few days ago nothing acute. Refuses breo for home symbicort . Respiratory panels negative. Improving today with oral steroids and breathing treatments - duonebs prn - continue prednisone  40 every day  # Non-obstructive CAD On recent cath. asymptomatic - home atorva  # Chronic hypoxic respiratory failure Stable on home 3 liters  # BPH - home flomax    DVT prophylaxis: therapeutic apixaban  Code Status: full Family Communication: none at bedside  Level of care: Progressive Status is: inpt    Consultants:  cardiology  Procedures: TEE DCCV pending  Antimicrobials:  none    Subjective: Reports breathing ok after breathing treatment  Objective: Vitals:   08/15/24 0747 08/15/24 1000 08/15/24 1144 08/15/24 1320  BP:   (!) 128/102 112/75  Pulse:   (!) 107   Resp:  20 18 (!) 24  Temp:   98 F (36.7 C)   TempSrc:   Oral   SpO2: 99%  94%   Weight:      Height:        Intake/Output Summary (Last 24 hours) at 08/15/2024 1343 Last data filed at 08/15/2024 1011 Gross per 24 hour  Intake  780 ml  Output --  Net 780 ml   Filed Weights   08/10/24 1449  Weight: 81.6 kg    Examination:  General exam: Appears calm and comfortable  Respiratory system: faint exp wheeze, normal wob Cardiovascular system: S1 & S2 heard, irreg Gastrointestinal system: Abdomen is nondistended, soft and nontender.   Central nervous system: Alert and oriented. No focal neurological deficits. Extremities: Symmetric 5 x 5 power. Skin: No rashes,  lesions or ulcers Psychiatry: Judgement and insight appear normal. Mood & affect appropriate.     Data Reviewed: I have personally reviewed following labs and imaging studies  CBC: Recent Labs  Lab 08/10/24 1456 08/11/24 0544  WBC 9.1 8.8  HGB 13.2 12.0*  HCT 40.6 37.2*  MCV 88.1 88.8  PLT 267 240   Basic Metabolic Panel: Recent Labs  Lab 08/10/24 1456 08/11/24 0544 08/12/24 0503 08/13/24 0336  NA 139 138 137 137  K 4.7 4.1 4.4 4.0  CL 103 105 102 101  CO2 24 27 29 30   GLUCOSE 117* 134* 134* 156*  BUN 14 17 14 13   CREATININE 0.85 1.07 0.94 0.77  CALCIUM  8.6* 8.3* 8.3* 8.5*  MG 2.1  --   --  2.0   GFR: Estimated Creatinine Clearance: 89.9 mL/min (by C-G formula based on SCr of 0.77 mg/dL). Liver Function Tests: No results for input(s): AST, ALT, ALKPHOS, BILITOT, PROT, ALBUMIN in the last 168 hours. No results for input(s): LIPASE, AMYLASE in the last 168 hours. No results for input(s): AMMONIA in the last 168 hours. Coagulation Profile: Recent Labs  Lab 08/10/24 1456  INR 1.2   Cardiac Enzymes: No results for input(s): CKTOTAL, CKMB, CKMBINDEX, TROPONINI in the last 168 hours. BNP (last 3 results) No results for input(s): PROBNP in the last 8760 hours. HbA1C: No results for input(s): HGBA1C in the last 72 hours. CBG: No results for input(s): GLUCAP in the last 168 hours. Lipid Profile: No results for input(s): CHOL, HDL, LDLCALC, TRIG, CHOLHDL, LDLDIRECT in the last 72 hours. Thyroid Function Tests: No results for input(s): TSH, T4TOTAL, FREET4, T3FREE, THYROIDAB in the last 72 hours.  Anemia Panel: No results for input(s): VITAMINB12, FOLATE, FERRITIN, TIBC, IRON, RETICCTPCT in the last 72 hours. Urine analysis: No results found for: COLORURINE, APPEARANCEUR, LABSPEC, PHURINE, GLUCOSEU, HGBUR, BILIRUBINUR, KETONESUR, PROTEINUR, UROBILINOGEN, NITRITE,  LEUKOCYTESUR Sepsis Labs: @LABRCNTIP (procalcitonin:4,lacticidven:4)  ) Recent Results (from the past 240 hours)  Resp panel by RT-PCR (RSV, Flu A&B, Covid) Anterior Nasal Swab     Status: None   Collection Time: 08/13/24  8:14 PM   Specimen: Anterior Nasal Swab  Result Value Ref Range Status   SARS Coronavirus 2 by RT PCR NEGATIVE NEGATIVE Final   Influenza A by PCR NEGATIVE NEGATIVE Final   Influenza B by PCR NEGATIVE NEGATIVE Final    Comment: (NOTE) The Xpert Xpress SARS-CoV-2/FLU/RSV plus assay is intended as an aid in the diagnosis of influenza from Nasopharyngeal swab specimens and should not be used as a sole basis for treatment. Nasal washings and aspirates are unacceptable for Xpert Xpress SARS-CoV-2/FLU/RSV testing.  Fact Sheet for Patients: BloggerCourse.com  Fact Sheet for Healthcare Providers: SeriousBroker.it  This test is not yet approved or cleared by the [place]  FDA and has been authorized for detection and/or diagnosis of SARS-CoV-2 by FDA under an Emergency Use Authorization (EUA). This EUA will remain in effect (meaning this test can be used) for the duration of the COVID-19 declaration under Section 564(b)(1) of the Act, 21 U.S.C. section  360bbb-3(b)(1), unless the authorization is terminated or revoked.     Resp Syncytial Virus by PCR NEGATIVE NEGATIVE Final    Comment: (NOTE) Fact Sheet for Patients: BloggerCourse.com  Fact Sheet for Healthcare Providers: SeriousBroker.it  This test is not yet approved or cleared by the [place]  FDA and has been authorized for detection and/or diagnosis of SARS-CoV-2 by FDA under an Emergency Use Authorization (EUA). This EUA will remain in effect (meaning this test can be used) for the duration of the COVID-19 declaration under Section 564(b)(1) of the Act, 21 U.S.C. section 360bbb-3(b)(1), unless the  authorization is terminated or revoked.  Performed at [facility], [address] [place], [place] [address]   Respiratory (~20 pathogens) panel by PCR     Status: None   Collection Time: 08/13/24  8:14 PM   Specimen: Nasopharyngeal Swab; Respiratory  Result Value Ref Range Status   Adenovirus NOT DETECTED NOT DETECTED Final   Coronavirus 229E NOT DETECTED NOT DETECTED Final    Comment: (NOTE) The Coronavirus on the Respiratory Panel, DOES NOT test for the novel  Coronavirus (2019 nCoV)    Coronavirus HKU1 NOT DETECTED NOT DETECTED Final   Coronavirus NL63 NOT DETECTED NOT DETECTED Final   Coronavirus OC43 NOT DETECTED NOT DETECTED Final   Metapneumovirus NOT DETECTED NOT DETECTED Final   Rhinovirus / Enterovirus NOT DETECTED NOT DETECTED Final   Influenza A NOT DETECTED NOT DETECTED Final   Influenza B NOT DETECTED NOT DETECTED Final   Parainfluenza Virus 1 NOT DETECTED NOT DETECTED Final   Parainfluenza Virus 2 NOT DETECTED NOT DETECTED Final   Parainfluenza Virus 3 NOT DETECTED NOT DETECTED Final   Parainfluenza Virus 4 NOT DETECTED NOT DETECTED Final   Respiratory Syncytial Virus NOT DETECTED NOT DETECTED Final   Bordetella pertussis NOT DETECTED NOT DETECTED Final   Bordetella Parapertussis NOT DETECTED NOT DETECTED Final   Chlamydophila pneumoniae NOT DETECTED NOT DETECTED Final   Mycoplasma pneumoniae NOT DETECTED NOT DETECTED Final    Comment: Performed at [facility], [address], [place], [place] [address]         Radiology Studies: No results found.       Scheduled Meds:  apixaban   5 mg Oral BID   atorvastatin   80 mg Oral Daily   brimonidine   1 drop Left Eye BID   brinzolamide   1 drop Left Eye BID   diltiazem   240 mg Oral Daily   fluticasone  furoate-vilanterol  1 puff Inhalation Daily   melatonin  5 mg Oral QHS   metoprolol tartrate  50 mg Oral Q6H   montelukast   10 mg Oral QHS   pantoprazole   20 mg Oral Daily   predniSONE   40 mg  Oral Q breakfast   tamsulosin   0.4 mg Oral Daily   Continuous Infusions:     LOS: 4 days     Devaughn KATHEE Ban, MD [facility]   If 7PM-7AM, please contact night-coverage [email or web address] Password [email or web address] 08/15/2024, 1:43 PM

## 2024-08-15 NOTE — TOC CM/SW Note (Signed)
 Transition of Care [facility]) - Inpatient Brief Assessment   Patient Details  Name: Bryan Kelley MRN: [id number] Date of Birth: 02/12/1956  Transition of Care [facility]) CM/SW Contact:    Lauraine JAYSON Carpen, LCSW Phone Number: 08/15/2024, 10:14 AM   Clinical Narrative: CSW reviewed chart. No TOC needs identified so far. CSW will continue to follow progress. Please place [facility] consult if any needs arise.  Transition of Care Asessment: Insurance and Status: Insurance coverage has been reviewed Patient has primary care physician: Yes Home environment has been reviewed: Single family home Prior level of function:: Not documented Prior/Current Home Services: No current home services Social Drivers of Health Review: SDOH reviewed no interventions necessary Readmission risk has been reviewed: Yes Transition of care needs: no transition of care needs at this time

## 2024-08-16 ENCOUNTER — Inpatient Hospital Stay (HOSPITAL_COMMUNITY)
Admit: 2024-08-16 | Discharge: 2024-08-16 | Disposition: A | Discharge status: Home / Self Care | Attending: Medical | Admitting: Medical

## 2024-08-16 ENCOUNTER — Inpatient Hospital Stay: Admitting: Certified Registered"

## 2024-08-16 ENCOUNTER — Encounter
Admission: EM | Disposition: A | Discharge status: Home / Self Care | Payer: Self-pay | Source: Ambulatory Visit | Attending: Obstetrics and Gynecology

## 2024-08-16 ENCOUNTER — Ambulatory Visit: Attending: Physician Assistant | Admitting: Physician Assistant

## 2024-08-16 DIAGNOSIS — J449 Chronic obstructive pulmonary disease, unspecified: Secondary | ICD-10-CM | POA: Diagnosis not present

## 2024-08-16 DIAGNOSIS — I4892 Unspecified atrial flutter: Secondary | ICD-10-CM

## 2024-08-16 DIAGNOSIS — E785 Hyperlipidemia, unspecified: Secondary | ICD-10-CM

## 2024-08-16 DIAGNOSIS — I2510 Atherosclerotic heart disease of native coronary artery without angina pectoris: Secondary | ICD-10-CM | POA: Diagnosis not present

## 2024-08-16 HISTORY — PX: CARDIOVERSION: SHX1299

## 2024-08-16 HISTORY — PX: TEE WITHOUT CARDIOVERSION: SHX5443

## 2024-08-16 LAB — ECHO TEE

## 2024-08-16 SURGERY — ECHOCARDIOGRAM, TRANSESOPHAGEAL
Anesthesia: General

## 2024-08-16 MED ORDER — BUDESONIDE-FORMOTEROL FUMARATE 80-4.5 MCG/ACT IN AERO: 2.0000 | INHALATION_SPRAY | Freq: Two times a day (BID) | RESPIRATORY_TRACT | 0 refills | Status: DC

## 2024-08-16 MED ORDER — SODIUM CHLORIDE 0.9 % IV SOLN: INTRAVENOUS | Status: DC

## 2024-08-16 MED ORDER — PROPOFOL 10 MG/ML IV BOLUS
INTRAVENOUS | Status: DC | PRN
  Administered 2024-08-16: 70 mg via INTRAVENOUS
  Administered 2024-08-16 (×4): 20 mg via INTRAVENOUS

## 2024-08-16 MED ORDER — PREDNISONE 20 MG PO TABS: 40.0000 mg | ORAL_TABLET | Freq: Every day | ORAL | 0 refills | Status: AC

## 2024-08-16 MED ORDER — LIDOCAINE HCL (CARDIAC) PF 100 MG/5ML IV SOSY
PREFILLED_SYRINGE | INTRAVENOUS | Status: DC | PRN
  Administered 2024-08-16: 50 mg via INTRATRACHEAL

## 2024-08-16 MED ORDER — ORAL CARE MOUTH RINSE: 15.0000 mL | OROMUCOSAL | Status: DC | PRN

## 2024-08-16 NOTE — Progress Notes (Signed)
*  PRELIMINARY RESULTS* Echocardiogram Echocardiogram Transesophageal has been performed.  Bryan Kelley 08/16/2024, 12:25 PM

## 2024-08-16 NOTE — Transfer of Care (Signed)
 Immediate Anesthesia Transfer of Care Note  Patient: Bryan Kelley  Procedure(s) Performed: ECHOCARDIOGRAM, TRANSESOPHAGEAL CARDIOVERSION  Patient Location: Cath Lab  Anesthesia Type:General  Level of Consciousness: drowsy and patient cooperative  Airway & Oxygen  Therapy: Patient Spontanous Breathing and Patient connected to face mask oxygen   Post-op Assessment: Report given to RN, Post -op Vital signs reviewed and stable, and Patient moving all extremities X 4  Post vital signs: Reviewed and stable  Last Vitals:  Vitals Value Taken Time  BP 96/61 08/16/24 12:30  Temp    Pulse 69 08/16/24 12:32  Resp 18 08/16/24 12:32  SpO2 99 % 08/16/24 12:32    Last Pain:  Vitals:   08/16/24 1134  TempSrc: Temporal  PainSc: 0-No pain      Patients Stated Pain Goal: 0 (08/15/24 0527)  Complications: No notable events documented.

## 2024-08-16 NOTE — Progress Notes (Signed)
 Rounding Note   Patient Name: Bryan Kelley Date of Encounter: 08/16/2024  [facility] Cardiologist: Alm Clay, MD   Subjective Sitting up in bed eating lunch during exam. Reports feeling much better s/p cardioversion. Maintaining sinus rhythm. Denies chest pain and shortness of breath.  Remains on 3 L supplemental O2, which is his baseline requirement. He endorses some lightheadedness and dizziness after receiving anesthesia.  Scheduled Meds:  apixaban   5 mg Oral BID   atorvastatin   80 mg Oral Daily   brimonidine   1 drop Left Eye BID   brinzolamide   1 drop Left Eye BID   diltiazem   240 mg Oral Daily   fluticasone  furoate-vilanterol  1 puff Inhalation Daily   melatonin  5 mg Oral QHS   montelukast   10 mg Oral QHS   pantoprazole   20 mg Oral Daily   predniSONE   40 mg Oral Q breakfast   tamsulosin   0.4 mg Oral Daily   Continuous Infusions:  PRN Meds: acetaminophen , ipratropium-albuterol , ondansetron  (ZOFRAN ) IV, mouth rinse   Vital Signs  Vitals:   08/16/24 1232 08/16/24 1235 08/16/24 1250 08/16/24 1305  BP:  103/60 102/64 103/74  Pulse: 69 69 63 63  Resp: 18 16 17 16   Temp:      TempSrc:      SpO2: 99% 99% 98% 99%  Weight:      Height:        Intake/Output Summary (Last 24 hours) at 08/16/2024 1423 Last data filed at 08/16/2024 1007 Gross per 24 hour  Intake 180 ml  Output 200 ml  Net -20 ml      08/10/2024    2:49 PM 07/28/2024    8:54 PM 07/04/2023   12:03 AM  Last 3 Weights  Weight (lbs) 180 lb 176 lb 5.9 oz 170 lb  Weight (kg) 81.647 kg 80 kg 77.111 kg      Telemetry Sinus rhythm with rate in 70s - Personally Reviewed  ECG  Sinus rhythm, rate 69 bpm- Personally Reviewed  Physical Exam GEN: No acute distress.   Neck: No JVD Cardiac: RRR, no murmurs, rubs, or gallops.  Respiratory: Faint wheezing bilaterally. GI: Soft, nontender, non-distended  MS: No edema; No deformity. Neuro:  Nonfocal  Psych: Normal affect   Labs High  Sensitivity Troponin:   Recent Labs  Lab 07/28/24 2252 07/29/24 0133 07/29/24 0426 08/10/24 1456 08/10/24 1643  TROPONINIHS 16 19* 15 16 18*     Chemistry Recent Labs  Lab 08/10/24 1456 08/11/24 0544 08/12/24 0503 08/13/24 0336  NA 139 138 137 137  K 4.7 4.1 4.4 4.0  CL 103 105 102 101  CO2 24 27 29 30   GLUCOSE 117* 134* 134* 156*  BUN 14 17 14 13   CREATININE 0.85 1.07 0.94 0.77  CALCIUM  8.6* 8.3* 8.3* 8.5*  MG 2.1  --   --  2.0  GFRNONAA >60 >60 >60 >60  ANIONGAP 12 6 6 6     Lipids No results for input(s): CHOL, TRIG, HDL, LABVLDL, LDLCALC, CHOLHDL in the last 168 hours.  Hematology Recent Labs  Lab 08/10/24 1456 08/11/24 0544  WBC 9.1 8.8  RBC 4.61 4.19*  HGB 13.2 12.0*  HCT 40.6 37.2*  MCV 88.1 88.8  MCH 28.6 28.6  MCHC 32.5 32.3  RDW 13.8 14.0  PLT 267 240   Thyroid  Recent Labs  Lab 08/10/24 1456  TSH 0.683    BNP Recent Labs  Lab 08/10/24 1643  BNP 234.2*    DDimer  Recent  Labs  Lab 08/10/24 1643  DDIMER 1.54*     Radiology   Cardiac Studies TEE 08/16/2024  1. Left ventricular ejection fraction, by estimation, is 60 to 65%. The  left ventricle has normal function.   2. No left atrial/left atrial appendage thrombus was detected. The LAA  emptying velocity was 40 cm/s.   TTE 07/29/2024  1. Left ventricular ejection fraction, by estimation, is 65 to 70%. The  left ventricle has normal function. The left ventricle has no regional  wall motion abnormalities. Left ventricular diastolic parameters are  consistent with Grade I diastolic  dysfunction (impaired relaxation).   2. Right ventricular systolic function is normal. The right ventricular  size is normal.   3. The mitral valve is normal in structure. No evidence of mitral valve  regurgitation.   4. The aortic valve was not well visualized. Aortic valve regurgitation  is not visualized.   5. The inferior vena cava is normal in size with greater than 50%  respiratory  variability, suggesting right atrial pressure of 3 mmHg.    LHC 07/28/2024 Diffuse moderate CAD: The most significant lesion being 90% stenosis of a tiny second diagonal branch associated with segmental eccentric 50% calcified LAD stenosis. Not flow-limiting.  LCx has 2 major OM branches and a small AV groove branch with this with small PL-no notable disease. RCA has tandem 30% proximal lesions and a 20% proximal to mid lesion with TIMI-3 flow down relatively small PDA with trivial posterolateral branches. Hyperdynamic LV significant ectopy with catheter insertion. EF estimated over 70%, and severely elevated LVEDP of 30 millimercury. Tachyarrhythmia originally suspected to be SVT but with 12 mg IV adenosine , this confirmed the presence of flutter/fib waves. He was then given 10 mg IV diltiazem  with improved heart rate into the 110s.  => Likely A-fib RVR Suspect AECOPD exacerbation exacerbated by tachyarrhythmia.  This could explain his chest pain.  He had notable chest pain when tachycardic that improved with rate control.  Patient Profile   68 y.o. male with hx of COPD, chronic respiratory failure on home oxygen , atrial flutter, hypertension, CAD being evaluated for atrial flutter with RVR.  Assessment & Plan   Atrial flutter with RVR - Originally presented 10/8 with tachycardia and found to be in atrial flutter with heart rates up to 150s bpm.  He had not been taking Eliquis  as prescribed at home. - Difficult rate control with ongoing spikes in HR up to 150s bpm - Underwent successful TEE/DCCV today - Maintaining sinus rhythm - Will discontinue metoprolol to avoid bradycardia - Continue Eliquis  5 mg twice daily and diltiazem  240 mg daily  CAD - Recent admission for chest pain and atrial flutter in 07/2024. Left heart cath on 9/25 showed 90% stenosis of a second diagonal branch associated with segmental and non-flow limiting 50% calcified LAD stenosis. Medically managed. - No angina -  Continue statin therapy and Eliquis  in lieu of aspirin  Hyperlipidemia - LDL 78 on 07/29/2024 - Continue Lipitor  80 mg daily  COPD/Chronic respiratory failure - Stable on home oxygen  requirement of 3 L   For questions or updates, please contact [facility] Please consult [email or web address] for contact info under       Signed, Lesley LITTIE Maffucci, PA-C  08/16/2024, 2:23 PM

## 2024-08-16 NOTE — H&P (Signed)
 History and Physical Interval Note:  08/16/2024 11:14 AM  Bryan Kelley  has presented today for TEE/DCCV, with the diagnosis of persistent atrial flutter.  The various methods of treatment have been discussed with the patient and family. After consideration of risks, benefits and other options for treatment, the patient has consented to  Procedure(s): ECHOCARDIOGRAM, TRANSESOPHAGEAL (N/A) CARDIOVERSION (N/A) as a diagnostic and therapeutic intervention.  The patient's history has been reviewed, patient examined, no change in status, stable for surgery.  I have reviewed the patient's chart and labs.  Questions were answered to the patient's satisfaction.     Caron [facility]

## 2024-08-16 NOTE — CV Procedure (Signed)
   TRANSESOPHAGEAL ECHOCARDIOGRAM GUIDED DIRECT CURRENT CARDIOVERSION  NAME:  Bryan Kelley    MRN: [id number] DOB:  [birth year]    ADMIT DATE: 08/10/2024  INDICATIONS: Symptomatic atrial flutter  PROCEDURE:   Informed consent was obtained prior to the procedure. The risks, benefits and alternatives for the procedure were discussed and the patient comprehended these risks.  Risks include, but are not limited to, cough, sore throat, vomiting, nausea, somnolence, esophageal and stomach trauma or perforation, bleeding, low blood pressure, aspiration, pneumonia, infection, trauma to the teeth and death.    After a procedural time-out, the oropharynx was anesthetized and the patient was sedated by the anesthesia service. The transesophageal probe was inserted in the esophagus and stomach without difficulty and multiple views were obtained.  COMPLICATIONS:    Complications: No complications Patient tolerated procedure well.  KEY FINDINGS:  Normal LV function.  No LAA thrombus.  Trivial AI, MR, TR, PI. Full Report to follow.   CARDIOVERSION:     Indications:  Symptomatic Atrial Flutter  Procedure Details:  Once the TEE was complete, the patient had the defibrillator pads placed in the anterior and posterior position. Once an appropriate level of sedation was confirmed, the patient was cardioverted x 1 with 200J of biphasic synchronized energy.  The patient converted to NSR.  There were no apparent complications.  The patient had normal neuro status and respiratory status post procedure with vitals stable as recorded elsewhere.  Adequate airway was maintained throughout and vital signs monitored per protocol.  Signed, Caron Poser, MD

## 2024-08-16 NOTE — Plan of Care (Signed)

## 2024-08-16 NOTE — Anesthesia Postprocedure Evaluation (Signed)
 Anesthesia Post Note  Patient: Bryan Kelley  Procedure(s) Performed: ECHOCARDIOGRAM, TRANSESOPHAGEAL CARDIOVERSION  Patient location during evaluation: PACU Anesthesia Type: General Level of consciousness: awake and alert Pain management: pain level controlled Vital Signs Assessment: post-procedure vital signs reviewed and stable Respiratory status: spontaneous breathing Cardiovascular status: stable Anesthetic complications: no   No notable events documented.   Last Vitals:  Vitals:   08/16/24 1250 08/16/24 1305  BP: 102/64 103/74  Pulse: 63 63  Resp: 17 16  Temp:    SpO2: 98% 99%    Last Pain:  Vitals:   08/16/24 1305  TempSrc:   PainSc: 0-No pain                 VAN STAVEREN,Aulton Routt

## 2024-08-16 NOTE — Progress Notes (Signed)
 Patient alert and oriented, tee and cardioversion today.  Walked around nursing station post procedure with no complications.  IV d/c and telemetry d/c.  VSS.  Discharge paperwork given to patient.  Patient being sent to discharge lounge to wait for his ride.  He has his home 02 tank with him but will be using our tank while he waits.

## 2024-08-16 NOTE — Anesthesia Preprocedure Evaluation (Signed)
 Anesthesia Evaluation  Patient identified by MRN, date of birth, ID band Patient awake    Reviewed: Allergy & Precautions, NPO status , Patient's Chart, lab work & pertinent test results  Airway Mallampati: II  TM Distance: >3 FB Neck ROM: full    Dental  (+) Missing   Pulmonary neg pulmonary ROS, resolved, COPD,  COPD inhaler, former smoker   Pulmonary exam normal  + decreased breath sounds      Cardiovascular Exercise Tolerance: Good hypertension, + CAD  negative cardio ROS Normal cardiovascular exam+ dysrhythmias Atrial Fibrillation  Rhythm:Regular Rate:Abnormal     Neuro/Psych    Depression    Blind OS negative neurological ROS  negative psych ROS   GI/Hepatic negative GI ROS, Neg liver ROS,GERD  Medicated,,  Endo/Other  negative endocrine ROS Hyperthyroidism   Renal/GU negative Renal ROS  negative genitourinary   Musculoskeletal negative musculoskeletal ROS (+)    Abdominal Normal abdominal exam  (+)   Peds negative pediatric ROS (+)  Hematology negative hematology ROS (+)   Anesthesia Other Findings Past Medical History: 05/07/2009: Acute bronchitis     Comment:  Qualifier: Diagnosis of   By: Antonetta MD, Margaret      10/06/2020: Acute on chronic hypoxic respiratory failure (HCC) 12/06/2019: Acute on chronic respiratory failure with hypercapnia  (HCC) 07/28/2024: Acute on chronic respiratory failure with hypoxia and  hypercapnia (HCC) 12/06/2019: Acute respiratory disease due to COVID-19 virus No date: Asthma No date: COPD (chronic obstructive pulmonary disease) (HCC) 10/29/2019: COPD with acute exacerbation (HCC) No date: Depression No date: GERD (gastroesophageal reflux disease) 07/28/2024: Inferior ST segment elevation 07/30/2024: Sinus tachycardia No date: Vision loss, left eye  Past Surgical History: 2009, about 8011,8010: EYE SURGERY     Comment:  steel in left eye on the job, legally  blind  07/28/2024: LEFT HEART CATH AND CORONARY ANGIOGRAPHY; N/A     Comment:  Procedure: LEFT HEART CATH AND CORONARY ANGIOGRAPHY;                Surgeon: Anner Alm ORN, MD;  Location: [facility] INVASIVE               CV LAB;  Service: Cardiovascular;  Laterality: N/A;  BMI    Body Mass Index: 29.05 kg/m      Reproductive/Obstetrics negative OB ROS                              Anesthesia Physical Anesthesia Plan  ASA: 3  Anesthesia Plan: General   Post-op Pain Management:    Induction: Intravenous  PONV Risk Score and Plan: Propofol infusion and TIVA  Airway Management Planned: Natural Airway and Nasal Cannula  Additional Equipment:   Intra-op Plan:   Post-operative Plan:   Informed Consent: I have reviewed the patients History and Physical, chart, labs and discussed the procedure including the risks, benefits and alternatives for the proposed anesthesia with the patient or authorized representative who has indicated his/her understanding and acceptance.     Dental Advisory Given  Plan Discussed with: CRNA  Anesthesia Plan Comments:         Anesthesia Quick Evaluation

## 2024-08-16 NOTE — Discharge Summary (Signed)
 Bryan Kelley FMW:[id number] DOB: [birth year] DOA: 08/10/2024  PCP: [facility]  Admit date: 08/10/2024 Discharge date: 08/16/2024  Time spent: 35 minutes  Recommendations for Outpatient Follow-up:  Pcp f/u Cardiology (EP) f/u 2-3 weeks, they are planning to call patient to schedule     Discharge Diagnoses:  Principal Problem:   Atrial flutter with rapid ventricular response, new onset(HCC) Active Problems:   CAD , nonobstructive on cath 07/28/2024   COPD (chronic obstructive pulmonary disease) (HCC)   Chronic hypoxemic respiratory failure (HCC)   Essential hypertension   BPH (benign prostatic hyperplasia)   Asthma   GERD (gastroesophageal reflux disease)   Alcohol  abuse   Personal history of tobacco use, presenting hazards to health   Glaucoma   GERD without esophagitis   Subclinical hyperthyroidism   Non compliance w medication regimen   Discharge Condition: stable  Diet recommendation: heart healthy  Filed Weights   08/10/24 1449  Weight: 81.6 kg    History of present illness:  From admission h and p Bryan Kelley is a 68 y.o. male with medical history significant of atrial flutter, hypertension, GERD, CAD, BPH, alcohol  use, glaucoma, subclinical hypothyroidism, asthma, COPD, chronic respiratory failure with hypoxia presenting with tachycardia from PCP.   Patient recently admitted 9/25-9/30.  Present with chest pain and possible STEMI.  STEMI was ruled out by cath showing nonobstructive CAD.  Patient was in new onset atrial flutter with RVR initially treated with IV diltiazem  and heparin  infusion which was transition to p.o. diltiazem  and Eliquis .  Patient also treated for COPD exacerbation and noted to have low TSH and is being followed for subclinical hyperthyroidism.   Patient had not felt too well since discharge and only picked up/filled his prescriptions yesterday which would be about a week without the new medications including diltiazem  and  Eliquis .  Took 1 Eliquis  and had some nausea and did not take his other medication.  Has had some tachycardia at home in the last few days.  Was seen at [facility] today and heart rate noted to be around 160 so he was sent to the ED.   Reports dyspnea on exertion.  Denies fevers, chills, chest pain, abdominal pain, constipation, diarrhea.   ED Course: Vital signs in the ED notable for heart rate in the 150s, blood pressure in the 110s-120s systolic, respiratory rate in the 20s, requiring home 3 L.   Lab workup included BMP with glucose 117, calcium  8.6.  Magnesium  normal.  CBC within normal limits.  PT 16, INR normal.  PTT pending.  Troponin normal with repeat pending.  D-dimer pending.  TSH normal.  BNP pending.   Patient started on diltiazem  infusion in the ED.  Hospital Course:   # A-fib/flutter with rvr Recent diagnosis, not compliant w/ d/c meds, overnight on admission on dilt gtt but remained in rvr, dig added and metop, now rate controlled, cardiology following. TEE DCCV performed on 10/14, successful. - d/c on apixaban , diltiazem , with EP f/u 2-3 weeks (cardiology to arrange) - compliance encouraged   # HTN Bps low normal - holding home losartan  - dilt as above   # COPD with exacerbation With chest tightness and wheeze 10/11. CT few days ago nothing acute. Refuses breo for home symbicort . Respiratory panels negative. Improving with oral steroids and breathing treatments - refilled home controller inhaler as he wasn't taking - prednisone  burst 3 more days   # Non-obstructive CAD On recent cath. asymptomatic - home atorva   # Chronic hypoxic  respiratory failure Stable on home 3 liters     Procedures: TEE DCCV   Consultations: cardiology  Discharge Exam: Vitals:   08/16/24 1305 08/16/24 1623  BP: 103/74   Pulse: 63 78  Resp: 16   Temp:    SpO2: 99% 100%    General: NAD Cardiovascular: RRR Respiratory: CTAB  Discharge Instructions   Discharge Instructions      Diet - low sodium heart healthy   Complete by: As directed    Increase activity slowly   Complete by: As directed       Allergies as of 08/16/2024       Reactions   Aspirin         Medication List     STOP taking these medications    HYDROcodone -acetaminophen  5-325 MG tablet Commonly known as: NORCO/VICODIN   losartan  50 MG tablet Commonly known as: COZAAR        TAKE these medications    acetaminophen  325 MG tablet Commonly known as: TYLENOL  Take 2 tablets (650 mg total) by mouth every 6 (six) hours as needed for mild pain, fever or headache.   albuterol  108 (90 Base) MCG/ACT inhaler Commonly known as: VENTOLIN  HFA Inhale 2 puffs into the lungs every 6 (six) hours as needed for wheezing or shortness of breath.   apixaban  5 MG Tabs tablet Commonly known as: ELIQUIS  Take 1 tablet (5 mg total) by mouth 2 (two) times daily.   atorvastatin  80 MG tablet Commonly known as: LIPITOR  Take 1 tablet (80 mg total) by mouth daily.   brimonidine  0.2 % ophthalmic solution Commonly known as: ALPHAGAN  Place 1 drop into the left eye 2 (two) times daily.   brinzolamide  1 % ophthalmic suspension Commonly known as: AZOPT  Place 1 drop into the left eye 2 (two) times daily.   budesonide -formoterol  80-4.5 MCG/ACT inhaler Commonly known as: Symbicort  Inhale 2 puffs into the lungs 2 (two) times daily.   diltiazem  120 MG 24 hr capsule Commonly known as: CARDIZEM  CD Take 1 capsule (120 mg total) by mouth daily.   ipratropium-albuterol  0.5-2.5 (3) MG/3ML Soln Commonly known as: DUONEB Take 3 mLs by nebulization 4 (four) times daily as needed.   montelukast  10 MG tablet Commonly known as: SINGULAIR  Take 1 tablet (10 mg total) by mouth at bedtime.   pantoprazole  20 MG tablet Commonly known as: PROTONIX  Take 1 tablet (20 mg total) by mouth daily.   polyethylene glycol 17 g packet Commonly known as: MIRALAX  / GLYCOLAX  Take 17 g by mouth 2 (two) times daily.    predniSONE  20 MG tablet Commonly known as: DELTASONE  Take 2 tablets (40 mg total) by mouth daily with breakfast for 3 days. Start taking on: August 17, 2024 What changed:  how much to take how to take this when to take this additional instructions   sildenafil 100 MG tablet Commonly known as: VIAGRA Take 100 mg by mouth as needed for erectile dysfunction.   tamsulosin  0.4 MG Caps capsule Commonly known as: FLOMAX  Take 1 capsule (0.4 mg total) by mouth daily.   theophylline  400 MG 24 hr tablet Commonly known as: UNIPHYL Take 1 tablet (400 mg total) by mouth at bedtime.       Allergies  Allergen Reactions   Aspirin     Follow-up Information     Riddle, Suzann, NP Follow up.   Specialty: Cardiology Why: in 2-3 weeks Contact information: [address] [place] [address] [phone]         [facility] Follow up.   Why: 1 week Contact information: [address] [place]m Solon [place] [address] [phone]         Theotis Lavelle BRAVO, MD Follow up.   Specialty: Specialist Contact information: [address] [place] [address] [phone]                  The results of significant diagnostics from this hospitalization (including imaging, microbiology, ancillary and laboratory) are listed below for reference.    Significant Diagnostic Studies: ECHO TEE Result Date: 08/16/2024    TRANSESOPHOGEAL ECHO REPORT   Patient Name:   OTONIEL MYHAND Date of Exam: 08/16/2024 Medical Rec #:  [id number]     Height:       66.0 in Accession #:    [id number]    Weight:       180.0 lb Date of Birth:  02-04-1956     BSA:          1.912 m Patient Age:    68 years      BP:           125/87 mmHg Patient Gender: M             HR:           79 bpm. Exam Location:  [facility] Procedure: Transesophageal Echo, Cardiac Doppler and Color Doppler (Both            Spectral and Color Flow Doppler were utilized during procedure). Indications:     Atrial  Flutter  History:         Patient has prior history of Echocardiogram examinations, most                  recent 07/29/2024. COPD.  Sonographer:     Christopher Furnace Referring Phys:  [id number] CADENCE H FURTH Diagnosing Phys: Caron Poser PROCEDURE: The transesophogeal probe was passed without difficulty through the esophogus of the patient. Sedation performed by different physician. The patient's vital signs; including heart rate, blood pressure, and oxygen  saturation; remained stable throughout the procedure. The patient developed no complications during the procedure. A successful direct current cardioversion was performed at 200 joules with 1 attempt. Transgastric views not obtained.  IMPRESSIONS  1. Left ventricular ejection fraction, by estimation, is 60 to 65%. The left ventricle has normal function.  2. No left atrial/left atrial appendage thrombus was detected. The LAA emptying velocity was 40 cm/s. Conclusion(s)/Recommendation(s): Normal biventricular function without evidence of hemodynamically significant valvular heart disease. No LA/LAA thrombus identified. Successful cardioversion performed with restoration of normal sinus rhythm. FINDINGS  Left Ventricle: Left ventricular ejection fraction, by estimation, is 60 to 65%. The left ventricle has normal function. The left ventricular internal cavity size was normal in size. Right Ventricle: The right ventricular size is not well visualized. Right vetricular wall thickness was not assessed. Right ventricular systolic function was not well visualized. Left Atrium: Left atrial size was not assessed. No left atrial/left atrial appendage thrombus was detected. The LAA emptying velocity was 40 cm/s. Right Atrium: Right atrial size was not assessed. Pericardium: There is no evidence of pericardial effusion. Mitral Valve: The mitral valve is normal in structure. Trivial mitral valve regurgitation. No evidence of mitral valve stenosis. Tricuspid Valve: The tricuspid  valve is normal in structure. Tricuspid valve regurgitation is trivial. No evidence of tricuspid stenosis. Aortic Valve: The aortic valve is tricuspid. Aortic valve regurgitation is trivial. No aortic stenosis is present. Pulmonic Valve: The pulmonic valve was  normal in structure. Pulmonic valve regurgitation is trivial. No evidence of pulmonic stenosis. Aorta: The aortic root and ascending aorta are structurally normal, with no evidence of dilitation. There is mild (Grade II) plaque. IAS/Shunts: No atrial level shunt detected by color flow Doppler. Caron Poser Electronically signed by Caron Poser Signature Date/Time: 08/16/2024/12:37:09 PM    Final    CT Angio Chest PE W and/or Wo Contrast Result Date: 08/10/2024 CLINICAL DATA:  Tachycardia, elevated D-dimer.  Atrial flutter. EXAM: CT ANGIOGRAPHY CHEST WITH CONTRAST TECHNIQUE: Multidetector CT imaging of the chest was performed using the standard protocol during bolus administration of intravenous contrast. Multiplanar CT image reconstructions and MIPs were obtained to evaluate the vascular anatomy. RADIATION DOSE REDUCTION: This exam was performed according to the departmental dose-optimization program which includes automated exposure control, adjustment of the mA and/or kV according to patient size and/or use of iterative reconstruction technique. CONTRAST:  75mL OMNIPAQUE  IOHEXOL  350 MG/ML SOLN COMPARISON:  January 04, 2024. FINDINGS: Cardiovascular: Satisfactory opacification of the pulmonary arteries to the segmental level. No evidence of pulmonary embolism. Normal heart size. No pericardial effusion. Coronary artery calcifications are noted. Mediastinum/Nodes: No enlarged mediastinal, hilar, or axillary lymph nodes. Thyroid gland, trachea, and esophagus demonstrate no significant findings. Lungs/Pleura: No pneumothorax or pleural effusion is noted. Minimal bibasilar subsegmental atelectasis or scarring is noted. Mild emphysematous disease is noted.  Upper Abdomen: No acute abnormality. Musculoskeletal: No chest wall abnormality. No acute or significant osseous findings. Review of the MIP images confirms the above findings. IMPRESSION: 1. No definite evidence of pulmonary embolus. 2. Coronary artery calcifications are noted suggesting coronary artery disease. 3. Minimal bibasilar subsegmental atelectasis or scarring is noted. 4. Emphysema. Emphysema (ICD10-J43.9). Electronically Signed   By: Lynwood Landy Raddle M.D.   On: 08/10/2024 19:12   DG Chest 2 View Result Date: 08/10/2024 CLINICAL DATA:  Tachycardia. EXAM: CHEST - 2 VIEW COMPARISON:  November 13, 2023. FINDINGS: The heart size and mediastinal contours are within normal limits. Both lungs are clear. The visualized skeletal structures are unremarkable. IMPRESSION: No active cardiopulmonary disease. Electronically Signed   By: Lynwood Landy Raddle M.D.   On: 08/10/2024 15:41   DG Abd 2 Views Result Date: 07/30/2024 CLINICAL DATA:  Abdominal pain EXAM: ABDOMEN - 2 VIEW COMPARISON:  01/24/2009 FINDINGS: Large amount of stool throughout the colon. Mild gaseous distention of the colon. No organomegaly or free air. IMPRESSION: Large stool burden throughout the colon with gaseous distention of the colon. Electronically Signed   By: Franky Crease M.D.   On: 07/30/2024 19:07   ECHOCARDIOGRAM COMPLETE Result Date: 07/29/2024    ECHOCARDIOGRAM REPORT   Patient Name:   ACIE CUSTIS Date of Exam: 07/29/2024 Medical Rec #:  [id number]     Height:       70.0 in Accession #:    [id number]    Weight:       176.4 lb Date of Birth:  12-11-1955     BSA:          1.979 m Patient Age:    68 years      BP:           112/69 mmHg Patient Gender: M             HR:           83 bpm. Exam Location:  [facility] Procedure: 2D Echo, Cardiac Doppler, Color Doppler and Intracardiac            Opacification Agent (Both  Spectral and Color Flow Doppler were            utilized during procedure). Indications:     Atrial Fibrillation I48.91                   Congestive Heart Failure I50.9  History:         Patient has prior history of Echocardiogram examinations, most                  recent 05/10/2021. Acute MI; Arrythmias:Atrial Fibrillation.  Sonographer:     Rosina Dunk Referring Phys:  [id number] DAVID W HARDING Diagnosing Phys: Redell Cave MD IMPRESSIONS  1. Left ventricular ejection fraction, by estimation, is 65 to 70%. The left ventricle has normal function. The left ventricle has no regional wall motion abnormalities. Left ventricular diastolic parameters are consistent with Grade I diastolic dysfunction (impaired relaxation).  2. Right ventricular systolic function is normal. The right ventricular size is normal.  3. The mitral valve is normal in structure. No evidence of mitral valve regurgitation.  4. The aortic valve was not well visualized. Aortic valve regurgitation is not visualized.  5. The inferior vena cava is normal in size with greater than 50% respiratory variability, suggesting right atrial pressure of 3 mmHg. FINDINGS  Left Ventricle: Left ventricular ejection fraction, by estimation, is 65 to 70%. The left ventricle has normal function. The left ventricle has no regional wall motion abnormalities. Definity  contrast agent was given IV to delineate the left ventricular  endocardial borders. The left ventricular internal cavity size was normal in size. There is no left ventricular hypertrophy. Left ventricular diastolic parameters are consistent with Grade I diastolic dysfunction (impaired relaxation). Right Ventricle: The right ventricular size is normal. No increase in right ventricular wall thickness. Right ventricular systolic function is normal. Left Atrium: Left atrial size was normal in size. Right Atrium: Right atrial size was normal in size. Pericardium: There is no evidence of pericardial effusion. Mitral Valve: The mitral valve is normal in structure. No evidence of mitral valve regurgitation. MV peak gradient, 2.3 mmHg.  The mean mitral valve gradient is 1.0 mmHg. Tricuspid Valve: The tricuspid valve is grossly normal. Tricuspid valve regurgitation is not demonstrated. Aortic Valve: The aortic valve was not well visualized. Aortic valve regurgitation is not visualized. Aortic valve mean gradient measures 4.0 mmHg. Aortic valve peak gradient measures 7.4 mmHg. Aortic valve area, by VTI measures 3.71 cm. Pulmonic Valve: The pulmonic valve was not well visualized. Pulmonic valve regurgitation is not visualized. Aorta: The aortic root is normal in size and structure. Venous: The inferior vena cava is normal in size with greater than 50% respiratory variability, suggesting right atrial pressure of 3 mmHg. IAS/Shunts: No atrial level shunt detected by color flow Doppler.  LEFT VENTRICLE PLAX 2D LVIDd:         4.20 cm     Diastology LV PW:         1.00 cm     LV e' medial:    8.92 cm/s LV IVS:        0.90 cm     LV E/e' medial:  6.5 LVOT diam:     2.20 cm     LV e' lateral:   9.25 cm/s LV SV:         104         LV E/e' lateral: 6.2 LV SV Index:   53 LVOT Area:     3.80 cm  LV Volumes (MOD)  LV vol d, MOD A2C: 60.0 ml LV vol d, MOD A4C: 77.6 ml LV vol s, MOD A2C: 14.1 ml LV vol s, MOD A4C: 14.7 ml LV SV MOD A2C:     45.9 ml LV SV MOD A4C:     77.6 ml LV SV MOD BP:      54.2 ml RIGHT VENTRICLE RV Basal diam:  4.20 cm RV Mid diam:    2.80 cm RV S prime:     12.10 cm/s TAPSE (M-mode): 2.1 cm LEFT ATRIUM           Index        RIGHT ATRIUM           Index LA Vol (A2C): 34.4 ml 17.39 ml/m  RA Area:     14.00 cm                                    RA Volume:   37.80 ml  19.10 ml/m  AORTIC VALVE                    PULMONIC VALVE AV Area (Vmax):    3.72 cm     PV Vmax:        0.93 m/s AV Area (Vmean):   3.66 cm     PV Vmean:       60.500 cm/s AV Area (VTI):     3.71 cm     PV VTI:         0.178 m AV Vmax:           136.00 cm/s  PV Peak grad:   3.4 mmHg AV Vmean:          94.600 cm/s  PV Mean grad:   2.0 mmHg AV VTI:            0.281 m       RVOT Peak grad: 2 mmHg AV Peak Grad:      7.4 mmHg AV Mean Grad:      4.0 mmHg LVOT Vmax:         133.00 cm/s LVOT Vmean:        91.200 cm/s LVOT VTI:          0.274 m LVOT/AV VTI ratio: 0.98  AORTA Ao Root diam: 3.30 cm MITRAL VALVE MV Area (PHT): 3.24 cm    SHUNTS MV Area VTI:   4.82 cm    Systemic VTI:  0.27 m MV Peak grad:  2.3 mmHg    Systemic Diam: 2.20 cm MV Mean grad:  1.0 mmHg    Pulmonic VTI:  0.113 m MV Vmax:       0.75 m/s MV Vmean:      48.2 cm/s MV Decel Time: 234 msec MV E velocity: 57.65 cm/s MV A velocity: 74.70 cm/s MV E/A ratio:  0.77 Redell Cave MD Electronically signed by Redell Cave MD Signature Date/Time: 07/29/2024/12:45:15 PM    Final    CARDIAC CATHETERIZATION Addendum Date: 07/28/2024   Mid LAD lesion is 50% stenosed with 90% stenosed side branch in 2nd Diag.   Ost RCA to Prox RCA lesion is 30% stenosed. Prox RCA lesion is 30% stenosed. Prox RCA to Mid RCA lesion is 25% stenosed.   There is hyperdynamic left ventricular systolic function.  The left ventricular ejection fraction is greater than 65% by visual estimate. LV end diastolic pressure is severely elevated.  A-fib/flutter with RVR developed upon completion of procedure => flutter/fib waves noted with IV adenosine  12 mg => rate improved with 10 mg IV diltiazem  Dominance: Right  FINDINGS Diffuse moderate CAD: The most significant lesion being 90% stenosis of a tiny second diagonal branch associated with segmental eccentric 50% calcified LAD stenosis. Not flow-limiting. LCx has 2 major OM branches and a small AV groove branch with this with small PL-no notable disease. RCA has tandem 30% proximal lesions and a 20% proximal to mid lesion with TIMI-3 flow down relatively small PDA with trivial posterolateral branches. Hyperdynamic LV significant ectopy with catheter insertion. EF estimated over 70%, and severely elevated LVEDP of 30 millimercury. Tachyarrhythmia originally suspected to be SVT but with 12 mg IV adenosine ,  this confirmed the presence of flutter/fib waves. He was then given 10 mg IV diltiazem  with improved heart rate into the 110s.  => Likely A-fib RVR Suspect AECOPD exacerbation exacerbated by tachyarrhythmia.  This could explain his chest pain.  He had notable chest pain when tachycardic that improved with rate control. RECOMMENDATIONS Admit to ICU under TRH service-[name], MD with [facility] and PCCM consultation Check 2D echocardiogram the morning and cycle troponin levels   He will be started on diltiazem  drip after 10 mg IV bolus-titrate for rate control and then convert to p.o. I have stopped his home amlodipine  and started losartan  50 mg daily; would not start aspirin given the plans for DOAC. Reassess volume status in the morning, but he has already had 400 mL out after 40 mg IV Lasix  PCCM to assist TRH with management of a AECOPD Started high-dose statin based on diffuse CAD.   Recommend to resume Rivaroxaban, at currently prescribed dose and frequency.   Patient will be started on IV heparin  starting 2 hours after TR band removal and depending on timing/duration of his arrhythmia, could consider DOAC on discharge.  I think for ease of use, Xarelto may be a better option. Alm MICAEL Clay, MD, MS Alm Clay, M.D., M.S. Interventional Cardiologist [facility]  [address]; [address] [place], [place]  [address] [phone]           Fax [phone]   Result Date: 07/28/2024 Table formatting from the original result was not included. Images from the original result were not included.   Mid LAD lesion is 50% stenosed with 90% stenosed side branch in 2nd Diag.   Ost RCA to Prox RCA lesion is 30% stenosed. Prox RCA lesion is 30% stenosed. Prox RCA to Mid RCA lesion is 25% stenosed.   There is hyperdynamic left ventricular systolic function.  The left ventricular ejection fraction is greater than 65% by visual estimate. LV end diastolic pressure is  severely elevated.   A-fib/flutter with RVR developed upon completion of procedure => flutter/fib waves noted with IV adenosine  12 mg => rate improved with 10 mg IV diltiazem  Dominance: Right  FINDINGS Diffuse moderate CAD: The most significant lesion being 90% stenosis of a tiny second diagonal branch associated with segmental eccentric 50% calcified LAD stenosis. Not flow-limiting. LCx has 2 major OM branches and a small AV groove branch with this with small PL-no notable disease. RCA has tandem 30% proximal lesions and a 20% proximal to mid lesion with TIMI-3 flow down relatively small PDA with trivial posterolateral branches. Hyperdynamic LV significant ectopy with catheter insertion. EF estimated over 70%, and severely elevated LVEDP of 30 millimercury. Tachyarrhythmia originally suspected to be SVT but with  12 mg IV adenosine , this confirmed the presence of flutter/fib waves. He was then given 10 mg IV diltiazem  with improved heart rate into the 110s.  => Likely A-fib RVR Suspect AECOPD exacerbation exacerbated by tachyarrhythmia.  This could explain his chest pain.  He had notable chest pain when tachycardic that improved with rate control. RECOMMENDATIONS Admit to ICU under TRH service-[name], MD with [facility] and PCCM consultation Check 2D echocardiogram the morning and cycle troponin levels   He will be started on diltiazem  drip after 10 mg IV bolus-titrate for rate control and then convert to p.o. I have stopped his home amlodipine  and started losartan  50 mg daily; would not start aspirin given the plans for DOAC. Reassess volume status in the morning, but he has already had 400 mL out after 40 mg IV Lasix  PCCM to assist TRH with management of a AECOPD Started high-dose statin based on diffuse CAD.   Recommend to resume Rivaroxaban, at currently prescribed dose and frequency.   Patient will be started on IV heparin  starting 2 hours after TR band removal and depending on timing/duration  of his arrhythmia, could consider DOAC on discharge.  I think for ease of use, Xarelto may be a better option. Alm MICAEL Clay, MD, MS Alm Clay, M.D., M.S. Interventional Cardiologist [facility]  [address] [place], [place]  [address] [phone]           Fax [phone]    Microbiology: Recent Results (from the past 240 hours)  Resp panel by RT-PCR (RSV, Flu A&B, Covid) Anterior Nasal Swab     Status: None   Collection Time: 08/13/24  8:14 PM   Specimen: Anterior Nasal Swab  Result Value Ref Range Status   SARS Coronavirus 2 by RT PCR NEGATIVE NEGATIVE Final   Influenza A by PCR NEGATIVE NEGATIVE Final   Influenza B by PCR NEGATIVE NEGATIVE Final    Comment: (NOTE) The Xpert Xpress SARS-CoV-2/FLU/RSV plus assay is intended as an aid in the diagnosis of influenza from Nasopharyngeal swab specimens and should not be used as a sole basis for treatment. Nasal washings and aspirates are unacceptable for Xpert Xpress SARS-CoV-2/FLU/RSV testing.  Fact Sheet for Patients: BloggerCourse.com  Fact Sheet for Healthcare Providers: SeriousBroker.it  This test is not yet approved or cleared by the [place]  FDA and has been authorized for detection and/or diagnosis of SARS-CoV-2 by FDA under an Emergency Use Authorization (EUA). This EUA will remain in effect (meaning this test can be used) for the duration of the COVID-19 declaration under Section 564(b)(1) of the Act, 21 U.S.C. section 360bbb-3(b)(1), unless the authorization is terminated or revoked.     Resp Syncytial Virus by PCR NEGATIVE NEGATIVE Final    Comment: (NOTE) Fact Sheet for Patients: BloggerCourse.com  Fact Sheet for Healthcare Providers: SeriousBroker.it  This test is not yet approved or cleared by the [place]  FDA and has been authorized for detection  and/or diagnosis of SARS-CoV-2 by FDA under an Emergency Use Authorization (EUA). This EUA will remain in effect (meaning this test can be used) for the duration of the COVID-19 declaration under Section 564(b)(1) of the Act, 21 U.S.C. section 360bbb-3(b)(1), unless the authorization is terminated or revoked.  Performed at [facility], [address] [place], [place] [address]   Respiratory (~20 pathogens) panel by PCR     Status: None   Collection Time: 08/13/24  8:14 PM   Specimen: Nasopharyngeal  Swab; Respiratory  Result Value Ref Range Status   Adenovirus NOT DETECTED NOT DETECTED Final   Coronavirus 229E NOT DETECTED NOT DETECTED Final    Comment: (NOTE) The Coronavirus on the Respiratory Panel, DOES NOT test for the novel  Coronavirus (2019 nCoV)    Coronavirus HKU1 NOT DETECTED NOT DETECTED Final   Coronavirus NL63 NOT DETECTED NOT DETECTED Final   Coronavirus OC43 NOT DETECTED NOT DETECTED Final   Metapneumovirus NOT DETECTED NOT DETECTED Final   Rhinovirus / Enterovirus NOT DETECTED NOT DETECTED Final   Influenza A NOT DETECTED NOT DETECTED Final   Influenza B NOT DETECTED NOT DETECTED Final   Parainfluenza Virus 1 NOT DETECTED NOT DETECTED Final   Parainfluenza Virus 2 NOT DETECTED NOT DETECTED Final   Parainfluenza Virus 3 NOT DETECTED NOT DETECTED Final   Parainfluenza Virus 4 NOT DETECTED NOT DETECTED Final   Respiratory Syncytial Virus NOT DETECTED NOT DETECTED Final   Bordetella pertussis NOT DETECTED NOT DETECTED Final   Bordetella Parapertussis NOT DETECTED NOT DETECTED Final   Chlamydophila pneumoniae NOT DETECTED NOT DETECTED Final   Mycoplasma pneumoniae NOT DETECTED NOT DETECTED Final    Comment: Performed at [facility], [address], [place], [place] [address]     Labs: Basic Metabolic Panel: Recent Labs  Lab 08/10/24 1456 08/11/24 0544 08/12/24 0503 08/13/24 0336  NA 139 138 137 137  K 4.7 4.1 4.4 4.0  CL 103 105 102 101  CO2  24 27 29 30   GLUCOSE 117* 134* 134* 156*  BUN 14 17 14 13   CREATININE 0.85 1.07 0.94 0.77  CALCIUM  8.6* 8.3* 8.3* 8.5*  MG 2.1  --   --  2.0   Liver Function Tests: No results for input(s): AST, ALT, ALKPHOS, BILITOT, PROT, ALBUMIN in the last 168 hours. No results for input(s): LIPASE, AMYLASE in the last 168 hours. No results for input(s): AMMONIA in the last 168 hours. CBC: Recent Labs  Lab 08/10/24 1456 08/11/24 0544  WBC 9.1 8.8  HGB 13.2 12.0*  HCT 40.6 37.2*  MCV 88.1 88.8  PLT 267 240   Cardiac Enzymes: No results for input(s): CKTOTAL, CKMB, CKMBINDEX, TROPONINI in the last 168 hours. BNP: BNP (last 3 results) Recent Labs    11/12/23 2359 08/10/24 1643  BNP 13.9 234.2*    ProBNP (last 3 results) No results for input(s): PROBNP in the last 8760 hours.  CBG: No results for input(s): GLUCAP in the last 168 hours.     Signed:  Devaughn KATHEE Ban MD.  [facility] 08/16/2024, 4:32 PM

## 2024-08-17 ENCOUNTER — Encounter: Payer: Self-pay | Admitting: Cardiology

## 2024-08-28 ENCOUNTER — Other Ambulatory Visit: Payer: Self-pay

## 2024-08-28 ENCOUNTER — Emergency Department
Admission: EM | Admit: 2024-08-28 | Discharge: 2024-08-29 | Disposition: A | Discharge status: Home / Self Care | Attending: Emergency Medicine | Admitting: Emergency Medicine

## 2024-08-28 ENCOUNTER — Emergency Department

## 2024-08-28 DIAGNOSIS — I2510 Atherosclerotic heart disease of native coronary artery without angina pectoris: Secondary | ICD-10-CM | POA: Diagnosis not present

## 2024-08-28 DIAGNOSIS — R0602 Shortness of breath: Secondary | ICD-10-CM | POA: Insufficient documentation

## 2024-08-28 DIAGNOSIS — R079 Chest pain, unspecified: Secondary | ICD-10-CM | POA: Diagnosis present

## 2024-08-28 DIAGNOSIS — R0789 Other chest pain: Secondary | ICD-10-CM | POA: Insufficient documentation

## 2024-08-28 DIAGNOSIS — Z7901 Long term (current) use of anticoagulants: Secondary | ICD-10-CM | POA: Diagnosis not present

## 2024-08-28 DIAGNOSIS — J4489 Other specified chronic obstructive pulmonary disease: Secondary | ICD-10-CM | POA: Insufficient documentation

## 2024-08-28 DIAGNOSIS — I10 Essential (primary) hypertension: Secondary | ICD-10-CM | POA: Insufficient documentation

## 2024-08-28 LAB — BASIC METABOLIC PANEL WITH GFR
Anion gap: 8 (ref 5–15)
BUN: 18 mg/dL (ref 8–23)
CO2: 28 mmol/L (ref 22–32)
Calcium: 8.7 mg/dL — ABNORMAL LOW (ref 8.9–10.3)
Chloride: 99 mmol/L (ref 98–111)
Creatinine, Ser: 0.8 mg/dL (ref 0.61–1.24)
GFR, Estimated: >60 mL/min (ref >60)
Glucose, Bld: 139 mg/dL — ABNORMAL HIGH (ref 70–99)
Potassium: 3.9 mmol/L (ref 3.5–5.1)
Sodium: 135 mmol/L (ref 135–145)

## 2024-08-28 LAB — CBC
HCT: 36.3 % — ABNORMAL LOW (ref 39.0–52.0)
Hemoglobin: 11.5 g/dL — ABNORMAL LOW (ref 13.0–17.0)
MCH: 28.5 pg (ref 26.0–34.0)
MCHC: 31.7 g/dL (ref 30.0–36.0)
MCV: 89.9 fL (ref 80.0–100.0)
Platelets: 285 K/uL (ref 150–400)
RBC: 4.04 MIL/uL — ABNORMAL LOW (ref 4.22–5.81)
RDW: 13.6 % (ref 11.5–15.5)
WBC: 9.7 K/uL (ref 4.0–10.5)
nRBC: 0 % (ref 0.0–0.2)

## 2024-08-28 LAB — TROPONIN I (HIGH SENSITIVITY): Troponin I (High Sensitivity): 7 ng/L (ref <18)

## 2024-08-28 NOTE — ED Provider Notes (Signed)
 [facility] Provider Note    Event Date/Time   First MD Initiated Contact with Patient 08/28/24 2330     (approximate)   History   Chest Pain   HPI GAMAL TODISCO is a 68 y.o. male whose history is notable for a flutter, hypertension, GERD, CAD, alcohol  use, asthma, COPD, and multiple recent hospital admissions including an admission about a month ago for chest pain with possible STEMI, ruled out by catheterization which showed nonobstructive CAD.  It was then that he was diagnosed with new onset atrial flutter with RVR and he was transition to oral diltiazem  and Eliquis .  He was then admitted for about a week and discharged approximately 9 days ago for A-fib with RVR.  He was evaluated with TEE and then cardioverted approximately 12 days ago and discharged on Eliquis  and diltiazem  and has a follow-up appointment with electrophysiology in 2 to 3 weeks.  He uses 3 L of oxygen  by nasal cannula chronically due to his COPD.  The patient presents tonight for acute onset chest pain that he describes as sharp.  He said it is reproducible if he pushes on the left side of his chest as well as when he takes deep breaths or moves in certain positions.  He always is a little bit short of breath but it it is at his baseline.  He has not had any vomiting.  He denies abdominal pain.  Physical Exam   Triage Vital Signs: ED Triage Vitals  Encounter Vitals Group     BP 08/28/24 2118 128/77     Girls Systolic BP Percentile --      Girls Diastolic BP Percentile --      Boys Systolic BP Percentile --      Boys Diastolic BP Percentile --      Pulse Rate 08/28/24 2115 75     Resp 08/28/24 2115 18     Temp 08/28/24 2115 98.3 F (36.8 C)     Temp Source 08/28/24 2115 Oral     SpO2 08/28/24 2115 100 %     Weight 08/28/24 2117 81.6 kg (180 lb)     Height 08/28/24 2117 1.676 m (5' 6)     Head Circumference --      Peak Flow --      Pain Score 08/28/24 2117 7     Pain Loc --       Pain Education --      Exclude from Growth Chart --     Most recent vital signs: Vitals:   08/28/24 2115 08/28/24 2118  BP:  128/77  Pulse: 75   Resp: 18   Temp: 98.3 F (36.8 C)   SpO2: 100%     General: Awake, no distress.  Chronically ill but at his baseline and nontoxic appearance. CV:  Good peripheral perfusion.  Regular rate and rhythm, normal heart sounds. Resp:  Normal effort. Speaking easily and comfortably, no accessory muscle usage nor intercostal retractions.  Patient has diminished breath sounds throughout with expiratory wheezing throughout lung fields but also states that this is his baseline.  His oxygen  saturation is currently 100% on his standard 3 L by nasal cannula. Abd:  No distention.  No tenderness to palpation. Other:  Patient has highly reproducible point tenderness to palpation of the left side of the chest wall.   ED Results / Procedures / Treatments   Labs (all labs ordered are listed, but only abnormal results are displayed) Labs Reviewed  BASIC METABOLIC PANEL WITH GFR - Abnormal; Notable for the following components:      Result Value   Glucose, Bld 139 (*)    Calcium  8.7 (*)    All other components within normal limits  CBC - Abnormal; Notable for the following components:   RBC 4.04 (*)    Hemoglobin 11.5 (*)    HCT 36.3 (*)    All other components within normal limits  TROPONIN I (HIGH SENSITIVITY)     EKG  ED ECG REPORT I, Darleene Dome, the attending physician, personally viewed and interpreted this ECG.  Date: 08/28/2024 EKG Time: 21:12 Rate: 75 Rhythm: normal sinus rhythm QRS Axis: normal Intervals: normal ST/T Wave abnormalities: Non-specific ST segment / T-wave changes, but no clear evidence of acute ischemia. Narrative Interpretation: no definitive evidence of acute ischemia; does not meet STEMI criteria.    RADIOLOGY I independently viewed and interpreted the patient's two-view chest x-ray.  I do not see any opacities  on the lateral view but there is a questionable area of atelectasis versus consolidation in the left lower lobe on the AP view.  Radiology notes and indicated with arrows the left lower lobe area that the radiologist identified as atelectasis.   PROCEDURES:  Critical Care performed: No  Procedures    IMPRESSION / MDM / ASSESSMENT AND PLAN / ED COURSE  I reviewed the triage vital signs and the nursing notes.                              Differential diagnosis includes, but is not limited to, angina, ACS,  Patient's presentation is most consistent with acute presentation with potential threat to life or bodily function.  Labs/studies ordered: Troponin, BMP, CBC, EKG, two-view chest x-ray  Interventions/Medications given:  Medications  lidocaine  (LIDODERM ) 5 % 1 patch (has no administration in time range)  acetaminophen  (TYLENOL ) tablet 1,000 mg (has no administration in time range)  ipratropium-albuterol  (DUONEB) 0.5-2.5 (3) MG/3ML nebulizer solution 3 mL (has no administration in time range)    (Note:  hospital course my include additional interventions and/or labs/studies not listed above.)   Stable vital signs, EKG with no evidence of ischemia, area of atelectasis on chest x-ray but without any signs or symptoms of pneumonia.  He has highly reproducible left-sided chest wall tenderness to palpation suggestive of a musculoskeletal cause.  I thoroughly reviewed his medical record as documented above including his 2 recent hospitalizations and the discharge summaries associated with his visits.  Notably he has had a CTA chest to rule out pulmonary embolism within the last 3 weeks and has been on Eliquis  for approximately a month.  He had a reassuring TEE within the last couple of weeks when he underwent cardioversion back to sinus rhythm and he remains in sinus rhythm.  He has had a heart catheterization within the last month that showed nonobstructive CAD.  Given the highly  reproducible musculoskeletal nature of his pain as well as all of the reassuring workup he has had recently, I am reassured that his pain tonight does not represent an acute or emergent disease process.  I considered hospitalization but feel that his medical screening exam is very reassuring at this time and he is appropriate for close follow-up which is already planned as an outpatient.  He will continue his regular medications and I am prescribing Lidoderm  patches and Tylenol  for his discomfort.  Of note, he states he had a  history of hepatitis but I explained that a short course of acetaminophen  should be appropriate for him.  He is calling for a ride and is comfortable with the plan for discharge and I will give him a DuoNeb which he takes on a regular basis at home to make sure he is comfortable for his trip home.  I gave my usual and customary return precautions      FINAL CLINICAL IMPRESSION(S) / ED DIAGNOSES   Final diagnoses:  Chest wall pain     Rx / DC Orders   ED Discharge Orders          Ordered    lidocaine  (LIDODERM ) 5 %  Every 12 hours        08/29/24 0003             Note:  This document was prepared using Dragon voice recognition software and may include unintentional dictation errors.   Gordan Huxley, MD 08/29/24 [phone]

## 2024-08-28 NOTE — ED Triage Notes (Signed)
 Pt to ED via EMS from home, pt reports chest pain. On 3L [place] chronically.

## 2024-08-28 NOTE — ED Triage Notes (Signed)
 Patient brought into triage via wheelchair with complaints of chest pain. Patient states he was sitting watching TV tonight when he had a sudden onset of sharp chest pain. Patient states he was recently seen due to irregular heart rhythm and had to be shocked into normal rhythm. Patient on 3L [place] due to COPD. Patient is currently on eliquis .

## 2024-08-29 DIAGNOSIS — R0789 Other chest pain: Secondary | ICD-10-CM | POA: Diagnosis not present

## 2024-08-29 MED ORDER — LIDOCAINE 5 % EX PTCH
1.0000 | MEDICATED_PATCH | CUTANEOUS | Status: DC
  Administered 2024-08-29: 1 via TRANSDERMAL
  Filled 2024-08-29: qty 1

## 2024-08-29 MED ORDER — ACETAMINOPHEN 500 MG PO TABS
1000.0000 mg | ORAL_TABLET | Freq: Once | ORAL | Status: AC
  Administered 2024-08-29: 1000 mg via ORAL
  Filled 2024-08-29: qty 2

## 2024-08-29 MED ORDER — LIDOCAINE 5 % EX PTCH: 1.0000 | MEDICATED_PATCH | Freq: Two times a day (BID) | CUTANEOUS | 0 refills | Status: DC

## 2024-08-29 MED ORDER — IPRATROPIUM-ALBUTEROL 0.5-2.5 (3) MG/3ML IN SOLN
3.0000 mL | Freq: Once | RESPIRATORY_TRACT | Status: AC
  Administered 2024-08-29: 3 mL via RESPIRATORY_TRACT
  Filled 2024-08-29: qty 3

## 2024-08-29 NOTE — Discharge Instructions (Addendum)
 Continue taking all of the previously prescribed medications.  In addition, you should consider taking acetaminophen  (Tylenol ) 1000 mg 3 times a day.  Given your history of hepatitis, we do not recommend that you take more than this, but try it for several days and see if this helps with the chest wall discomfort.  In addition, try using the prescribed Lidoderm  patches as written (12 hours on, 12 hours off), as this should also help your discomfort.  Follow-up as scheduled with your cardiologist and electrophysiologist, and consider scheduling a follow-up appointment with your primary care provider.  Return to the emergency department if you develop new or worsening symptoms that concern you.

## 2024-08-29 NOTE — ED Provider Notes (Incomplete)
 [facility] Provider Note    Event Date/Time   First MD Initiated Contact with Patient 08/28/24 2330     (approximate)   History   Chest Pain   HPI Bryan Kelley is a 68 y.o. male whose history is notable for a flutter, hypertension, GERD, CAD, alcohol  use, asthma, COPD, and multiple recent hospital admissions including an admission about a month ago for chest pain with possible STEMI, ruled out by catheterization which showed nonobstructive CAD.  It was then that he was diagnosed with new onset atrial flutter with RVR and he was transition to oral diltiazem  and Eliquis .  He was then admitted for about a week and discharged approximately 9 days ago for A-fib with RVR.  He was evaluated with TEE and then cardioverted approximately 12 days ago and discharged on Eliquis  and diltiazem  and has a follow-up appointment with electrophysiology in 2 to 3 weeks.  He uses 3 L of oxygen  by nasal cannula chronically due to his COPD.  The patient presents tonight for acute onset chest pain that he describes as sharp.  He said it is reproducible if he pushes on the left side of his chest as well as when he takes deep breaths or moves in certain positions.  He always is a little bit short of breath but it it is at his baseline.  He has not had any vomiting.  He denies abdominal pain.  Physical Exam   Triage Vital Signs: ED Triage Vitals  Encounter Vitals Group     BP 08/28/24 2118 128/77     Girls Systolic BP Percentile --      Girls Diastolic BP Percentile --      Boys Systolic BP Percentile --      Boys Diastolic BP Percentile --      Pulse Rate 08/28/24 2115 75     Resp 08/28/24 2115 18     Temp 08/28/24 2115 98.3 F (36.8 C)     Temp Source 08/28/24 2115 Oral     SpO2 08/28/24 2115 100 %     Weight 08/28/24 2117 81.6 kg (180 lb)     Height 08/28/24 2117 1.676 m (5' 6)     Head Circumference --      Peak Flow --      Pain Score 08/28/24 2117 7     Pain Loc --       Pain Education --      Exclude from Growth Chart --     Most recent vital signs: Vitals:   08/28/24 2115 08/28/24 2118  BP:  128/77  Pulse: 75   Resp: 18   Temp: 98.3 F (36.8 C)   SpO2: 100%     General: Awake, no distress.  Chronically ill but at his baseline and nontoxic appearance. CV:  Good peripheral perfusion.  Regular rate and rhythm, normal heart sounds. Resp:  Normal effort. Speaking easily and comfortably, no accessory muscle usage nor intercostal retractions.  Patient has diminished breath sounds throughout with expiratory wheezing throughout lung fields but also states that this is his baseline.  His oxygen  saturation is currently 100% on his standard 3 L by nasal cannula. Abd:  No distention.  No tenderness to palpation. Other:  Patient has highly reproducible point tenderness to palpation of the left side of the chest wall.   ED Results / Procedures / Treatments   Labs (all labs ordered are listed, but only abnormal results are displayed) Labs Reviewed  BASIC METABOLIC PANEL WITH GFR - Abnormal; Notable for the following components:      Result Value   Glucose, Bld 139 (*)    Calcium  8.7 (*)    All other components within normal limits  CBC - Abnormal; Notable for the following components:   RBC 4.04 (*)    Hemoglobin 11.5 (*)    HCT 36.3 (*)    All other components within normal limits  TROPONIN I (HIGH SENSITIVITY)  TROPONIN I (HIGH SENSITIVITY)     EKG  ED ECG REPORT I, Darleene Dome, the attending physician, personally viewed and interpreted this ECG.  Date: 08/28/2024 EKG Time: 21:12 Rate: 75 Rhythm: normal sinus rhythm QRS Axis: normal Intervals: normal ST/T Wave abnormalities: Non-specific ST segment / T-wave changes, but no clear evidence of acute ischemia. Narrative Interpretation: no definitive evidence of acute ischemia; does not meet STEMI criteria.    RADIOLOGY I independently viewed and interpreted the patient's two-view chest  x-ray.  I do not see any opacities on the lateral view but there is a questionable area of atelectasis versus consolidation in the left lower lobe on the AP view.  Radiology notes and indicated with arrows the left lower lobe area that the radiologist identified as atelectasis.   PROCEDURES:  Critical Care performed: {CriticalCareYesNo:19197::Yes, see critical care procedure note(s),No}  Procedures    IMPRESSION / MDM / ASSESSMENT AND PLAN / ED COURSE  I reviewed the triage vital signs and the nursing notes.                              Differential diagnosis includes, but is not limited to, angina, ACS,  Patient's presentation is most consistent with {EM COPA:27473}  Labs/studies ordered: ***  Interventions/Medications given:  Medications - No data to display *** (Note:  hospital course my include additional interventions and/or labs/studies not listed above.)   ***  {**The patient is on the cardiac monitor to evaluate for evidence of arrhythmia and/or significant heart rate changes.**}       FINAL CLINICAL IMPRESSION(S) / ED DIAGNOSES   Final diagnoses:  None     Rx / DC Orders   ED Discharge Orders     None        Note:  This document was prepared using Dragon voice recognition software and may include unintentional dictation errors.

## 2024-09-01 ENCOUNTER — Ambulatory Visit: Attending: Cardiology | Admitting: Cardiology

## 2024-09-01 NOTE — Progress Notes (Unsigned)
 This encounter was created in error - please disregard.

## 2024-09-05 ENCOUNTER — Encounter: Payer: Self-pay | Admitting: Cardiology

## 2024-09-08 ENCOUNTER — Telehealth: Payer: Self-pay | Admitting: Cardiology

## 2024-09-08 NOTE — Telephone Encounter (Signed)
 Tried to call pt for a follow up appointment with S Riddle, there was a no show.

## 2024-09-08 NOTE — Telephone Encounter (Signed)
-----   Message from Suzann Riddle sent at 09/02/2024 11:42 AM EDT ----- Regarding: RE: epp referral from gollan I was scheduled to see him yesterday, perfectly OK to schedule with either myself or Parker. ----- Message ----- From: Jackson Berwyn ORN Sent: 09/02/2024  11:37 AM EDT To: Chantal Needle, NP Subject: epp referral from gollan                       Should I schedule with EPP for you or Parker. Gollan had a referral for you?

## 2024-09-15 ENCOUNTER — Inpatient Hospital Stay
Admission: EM | Admit: 2024-09-15 | Discharge: 2024-09-17 | DRG: 309 | Disposition: A | Discharge status: Home / Self Care | Source: Ambulatory Visit | Attending: Osteopathic Medicine | Admitting: Osteopathic Medicine

## 2024-09-15 ENCOUNTER — Other Ambulatory Visit: Payer: Self-pay

## 2024-09-15 ENCOUNTER — Other Ambulatory Visit: Payer: Self-pay | Admitting: Cardiovascular Disease

## 2024-09-15 ENCOUNTER — Emergency Department

## 2024-09-15 DIAGNOSIS — I4892 Unspecified atrial flutter: Secondary | ICD-10-CM | POA: Diagnosis not present

## 2024-09-15 DIAGNOSIS — I483 Typical atrial flutter: Secondary | ICD-10-CM

## 2024-09-15 DIAGNOSIS — R531 Weakness: Secondary | ICD-10-CM | POA: Diagnosis present

## 2024-09-15 DIAGNOSIS — Z6827 Body mass index (BMI) 27.0-27.9, adult: Secondary | ICD-10-CM

## 2024-09-15 DIAGNOSIS — J449 Chronic obstructive pulmonary disease, unspecified: Secondary | ICD-10-CM | POA: Diagnosis present

## 2024-09-15 DIAGNOSIS — J441 Chronic obstructive pulmonary disease with (acute) exacerbation: Secondary | ICD-10-CM | POA: Diagnosis not present

## 2024-09-15 DIAGNOSIS — E785 Hyperlipidemia, unspecified: Secondary | ICD-10-CM | POA: Diagnosis present

## 2024-09-15 DIAGNOSIS — Z825 Family history of asthma and other chronic lower respiratory diseases: Secondary | ICD-10-CM

## 2024-09-15 DIAGNOSIS — Z79899 Other long term (current) drug therapy: Secondary | ICD-10-CM

## 2024-09-15 DIAGNOSIS — I4819 Other persistent atrial fibrillation: Secondary | ICD-10-CM | POA: Diagnosis present

## 2024-09-15 DIAGNOSIS — H548 Legal blindness, as defined in USA: Secondary | ICD-10-CM | POA: Diagnosis present

## 2024-09-15 DIAGNOSIS — Z886 Allergy status to analgesic agent status: Secondary | ICD-10-CM

## 2024-09-15 DIAGNOSIS — Z87891 Personal history of nicotine dependence: Secondary | ICD-10-CM

## 2024-09-15 DIAGNOSIS — J9611 Chronic respiratory failure with hypoxia: Secondary | ICD-10-CM | POA: Diagnosis present

## 2024-09-15 DIAGNOSIS — Z8616 Personal history of COVID-19: Secondary | ICD-10-CM

## 2024-09-15 DIAGNOSIS — I2510 Atherosclerotic heart disease of native coronary artery without angina pectoris: Secondary | ICD-10-CM | POA: Diagnosis not present

## 2024-09-15 DIAGNOSIS — Z833 Family history of diabetes mellitus: Secondary | ICD-10-CM

## 2024-09-15 DIAGNOSIS — Z9861 Coronary angioplasty status: Secondary | ICD-10-CM

## 2024-09-15 DIAGNOSIS — K219 Gastro-esophageal reflux disease without esophagitis: Secondary | ICD-10-CM | POA: Diagnosis present

## 2024-09-15 DIAGNOSIS — E663 Overweight: Secondary | ICD-10-CM | POA: Diagnosis present

## 2024-09-15 DIAGNOSIS — F32A Depression, unspecified: Secondary | ICD-10-CM | POA: Diagnosis present

## 2024-09-15 DIAGNOSIS — I4891 Unspecified atrial fibrillation: Secondary | ICD-10-CM

## 2024-09-15 DIAGNOSIS — I252 Old myocardial infarction: Secondary | ICD-10-CM

## 2024-09-15 DIAGNOSIS — N400 Enlarged prostate without lower urinary tract symptoms: Secondary | ICD-10-CM | POA: Diagnosis present

## 2024-09-15 DIAGNOSIS — Z8249 Family history of ischemic heart disease and other diseases of the circulatory system: Secondary | ICD-10-CM

## 2024-09-15 DIAGNOSIS — Z823 Family history of stroke: Secondary | ICD-10-CM

## 2024-09-15 DIAGNOSIS — Z9981 Dependence on supplemental oxygen: Secondary | ICD-10-CM

## 2024-09-15 DIAGNOSIS — Z7901 Long term (current) use of anticoagulants: Secondary | ICD-10-CM

## 2024-09-15 DIAGNOSIS — R0602 Shortness of breath: Secondary | ICD-10-CM

## 2024-09-15 DIAGNOSIS — I10 Essential (primary) hypertension: Secondary | ICD-10-CM | POA: Diagnosis present

## 2024-09-15 LAB — RESP PANEL BY RT-PCR (RSV, FLU A&B, COVID)  RVPGX2
Influenza A by PCR: NEGATIVE
Influenza B by PCR: NEGATIVE
Resp Syncytial Virus by PCR: NEGATIVE
SARS Coronavirus 2 by RT PCR: NEGATIVE

## 2024-09-15 LAB — PROTIME-INR
INR: 1.4 — ABNORMAL HIGH (ref 0.8–1.2)
Prothrombin Time: 17.8 s — ABNORMAL HIGH (ref 11.4–15.2)

## 2024-09-15 LAB — CBC
HCT: 37.4 % — ABNORMAL LOW (ref 39.0–52.0)
Hemoglobin: 12 g/dL — ABNORMAL LOW (ref 13.0–17.0)
MCH: 28.4 pg (ref 26.0–34.0)
MCHC: 32.1 g/dL (ref 30.0–36.0)
MCV: 88.4 fL (ref 80.0–100.0)
Platelets: 321 K/uL (ref 150–400)
RBC: 4.23 MIL/uL (ref 4.22–5.81)
RDW: 13.6 % (ref 11.5–15.5)
WBC: 7.5 K/uL (ref 4.0–10.5)
nRBC: 0 % (ref 0.0–0.2)

## 2024-09-15 LAB — BASIC METABOLIC PANEL WITH GFR
Anion gap: 10 (ref 5–15)
BUN: 12 mg/dL (ref 8–23)
CO2: 26 mmol/L (ref 22–32)
Calcium: 9.4 mg/dL (ref 8.9–10.3)
Chloride: 101 mmol/L (ref 98–111)
Creatinine, Ser: 0.84 mg/dL (ref 0.61–1.24)
GFR, Estimated: >60 mL/min (ref >60)
Glucose, Bld: 119 mg/dL — ABNORMAL HIGH (ref 70–99)
Potassium: 3.9 mmol/L (ref 3.5–5.1)
Sodium: 137 mmol/L (ref 135–145)

## 2024-09-15 LAB — TROPONIN T, HIGH SENSITIVITY
Troponin T High Sensitivity: 20 ng/L — ABNORMAL HIGH (ref 0–19)
Troponin T High Sensitivity: 18 ng/L (ref 0–19)

## 2024-09-15 MED ORDER — FLUTICASONE FUROATE-VILANTEROL 100-25 MCG/ACT IN AEPB
1.0000 | INHALATION_SPRAY | Freq: Every day | RESPIRATORY_TRACT | Status: DC
  Filled 2024-09-15: qty 28

## 2024-09-15 MED ORDER — DILTIAZEM HCL 25 MG/5ML IV SOLN
15.0000 mg | Freq: Once | INTRAVENOUS | Status: AC
  Administered 2024-09-15: 15 mg via INTRAVENOUS
  Filled 2024-09-15: qty 5

## 2024-09-15 MED ORDER — PANTOPRAZOLE SODIUM 20 MG PO TBEC
20.0000 mg | DELAYED_RELEASE_TABLET | Freq: Every day | ORAL | Status: DC
  Administered 2024-09-15 – 2024-09-17 (×2): 20 mg via ORAL
  Filled 2024-09-15 (×3): qty 1

## 2024-09-15 MED ORDER — ACETAMINOPHEN 325 MG PO TABS: 650.0000 mg | ORAL_TABLET | Freq: Four times a day (QID) | ORAL | Status: DC | PRN

## 2024-09-15 MED ORDER — LEVALBUTEROL HCL 0.63 MG/3ML IN NEBU
0.6300 mg | INHALATION_SOLUTION | Freq: Once | RESPIRATORY_TRACT | Status: AC
  Administered 2024-09-15: 0.63 mg via RESPIRATORY_TRACT
  Filled 2024-09-15: qty 3

## 2024-09-15 MED ORDER — MONTELUKAST SODIUM 10 MG PO TABS
10.0000 mg | ORAL_TABLET | Freq: Every day | ORAL | Status: DC
  Administered 2024-09-15 – 2024-09-16 (×2): 10 mg via ORAL
  Filled 2024-09-15 (×2): qty 1

## 2024-09-15 MED ORDER — DILTIAZEM HCL ER COATED BEADS 120 MG PO CP24
120.0000 mg | ORAL_CAPSULE | Freq: Every day | ORAL | Status: DC
  Administered 2024-09-17: 120 mg via ORAL
  Filled 2024-09-15 (×2): qty 1

## 2024-09-15 MED ORDER — DILTIAZEM LOAD VIA INFUSION
10.0000 mg | Freq: Once | INTRAVENOUS | Status: AC
  Administered 2024-09-15: 10 mg via INTRAVENOUS
  Filled 2024-09-15: qty 10

## 2024-09-15 MED ORDER — ATORVASTATIN CALCIUM 80 MG PO TABS
80.0000 mg | ORAL_TABLET | Freq: Every day | ORAL | Status: DC
  Administered 2024-09-17: 80 mg via ORAL
  Filled 2024-09-15: qty 1

## 2024-09-15 MED ORDER — DILTIAZEM HCL-DEXTROSE 125-5 MG/125ML-% IV SOLN (PREMIX)
5.0000 mg/h | INTRAVENOUS | Status: DC
  Administered 2024-09-15: 5 mg/h via INTRAVENOUS
  Administered 2024-09-16: 15 mg/h via INTRAVENOUS
  Filled 2024-09-15 (×2): qty 125

## 2024-09-15 MED ORDER — SENNOSIDES-DOCUSATE SODIUM 8.6-50 MG PO TABS: 1.0000 | ORAL_TABLET | Freq: Every evening | ORAL | Status: DC | PRN

## 2024-09-15 MED ORDER — IPRATROPIUM-ALBUTEROL 0.5-2.5 (3) MG/3ML IN SOLN
3.0000 mL | Freq: Four times a day (QID) | RESPIRATORY_TRACT | Status: DC | PRN
  Administered 2024-09-15 – 2024-09-17 (×6): 3 mL via RESPIRATORY_TRACT
  Filled 2024-09-15 (×5): qty 3

## 2024-09-15 MED ORDER — AMIODARONE LOAD VIA INFUSION
150.0000 mg | Freq: Once | INTRAVENOUS | Status: AC
  Administered 2024-09-15: 150 mg via INTRAVENOUS
  Filled 2024-09-15: qty 83.34

## 2024-09-15 MED ORDER — AMIODARONE HCL IN DEXTROSE 360-4.14 MG/200ML-% IV SOLN
60.0000 mg/h | INTRAVENOUS | Status: DC
  Administered 2024-09-15 (×2): 60 mg/h via INTRAVENOUS
  Filled 2024-09-15 (×2): qty 200

## 2024-09-15 MED ORDER — PREDNISONE 20 MG PO TABS
40.0000 mg | ORAL_TABLET | Freq: Once | ORAL | Status: AC
  Administered 2024-09-15: 40 mg via ORAL
  Filled 2024-09-15: qty 2

## 2024-09-15 MED ORDER — AMIODARONE HCL IN DEXTROSE 360-4.14 MG/200ML-% IV SOLN
30.0000 mg/h | INTRAVENOUS | Status: DC
  Administered 2024-09-15 – 2024-09-16 (×2): 30 mg/h via INTRAVENOUS

## 2024-09-15 MED ORDER — ONDANSETRON HCL 4 MG PO TABS: 4.0000 mg | ORAL_TABLET | Freq: Four times a day (QID) | ORAL | Status: DC | PRN

## 2024-09-15 MED ORDER — APIXABAN 5 MG PO TABS: 5.0000 mg | ORAL_TABLET | Freq: Two times a day (BID) | ORAL | Status: DC

## 2024-09-15 MED ORDER — APIXABAN 5 MG PO TABS
5.0000 mg | ORAL_TABLET | Freq: Two times a day (BID) | ORAL | Status: DC
  Administered 2024-09-15 – 2024-09-17 (×3): 5 mg via ORAL
  Filled 2024-09-15 (×3): qty 1

## 2024-09-15 MED ORDER — ONDANSETRON HCL 4 MG/2ML IJ SOLN: 4.0000 mg | Freq: Four times a day (QID) | INTRAMUSCULAR | Status: DC | PRN

## 2024-09-15 MED ORDER — POLYETHYLENE GLYCOL 3350 17 G PO PACK: 17.0000 g | PACK | Freq: Every day | ORAL | Status: DC | PRN

## 2024-09-15 MED ORDER — ACETAMINOPHEN 650 MG RE SUPP: 650.0000 mg | Freq: Four times a day (QID) | RECTAL | Status: DC | PRN

## 2024-09-15 MED ORDER — HYDRALAZINE HCL 20 MG/ML IJ SOLN: 5.0000 mg | Freq: Four times a day (QID) | INTRAMUSCULAR | Status: DC | PRN

## 2024-09-15 NOTE — Assessment & Plan Note (Addendum)
 Home diltiazem  120 mg p.o. daily resumed Tolazine 5 mg IV every 6 hours as needed for SBP > 160, 5 days ordered

## 2024-09-15 NOTE — Assessment & Plan Note (Signed)
 Heart cath on 07/28/2024: No PCI done.  Recommended to resume home medication including losartan , rosuvastatin, and home anticoagulation.

## 2024-09-15 NOTE — Assessment & Plan Note (Signed)
 Home atorvastatin  80 mg nightly resume

## 2024-09-15 NOTE — ED Notes (Signed)
 Called CCMD to initiate cardiac monitoring.

## 2024-09-15 NOTE — Assessment & Plan Note (Signed)
 Patient been compliant with home diltiazem  120 mg p.o. Patient is status post cardioversion on 08/16/2024 Cardiology has been consulted and plans for cardioversion tomorrow N.p.o. after midnight Home diltiazem  120 mg p.o. daily resume

## 2024-09-15 NOTE — Assessment & Plan Note (Signed)
 Home PPI resumed

## 2024-09-15 NOTE — Assessment & Plan Note (Signed)
 Not in acute exacerbation Continue oxygen  3 L as needed Home long-acting inhaler equivalent resumed

## 2024-09-15 NOTE — Hospital Course (Addendum)
 Hospital course / significant events:   Bryan Kelley is a 68 year old male with history of hyperlipidemia, hypertension, atrial flutter, and atrial fibrillation on anticoagulation, GERD, BPH, erectile dysfunction, COPD on chronic 3 L nasal cannula, who presents to the ED for chief concerns of increased heart rate and shortness of breath at [facility] where he was for a regular checkup and sent to the ED.  Admitted to hosptialist 11/13 w/ cardiology consult for Afib/flutter. Cardioverted 11/14 and on amio IV --> po later in the day.    Consultants:  Cardiology   Procedures/Surgeries: 09/16/24 TEE cardioversion       ASSESSMENT & PLAN:   Persistent atrial fibrillation/flutter: likely require rhythm control strategy going forward.   oral amiodarone as directed  Given his relatively young age and chronic lung disease, he may not be a good candidate for long-term amiodarone therapy.  Follow w/ EP outpatient  continue apixaban  5 mg twice daily  Continue cardizem  120 mg    Coronary artery disease and noncardiac chest pain: cardiac catheterization 2 months ago, which showed mild-moderate, nonobstructive disease other than a relatively small diagonal that had significant ostial stenosis (not amenable to intervention.   apixaban   high intensity statin  NO aspirin     COPD (chronic obstructive pulmonary disease)  Chronic hypoxic respiratory failure on home O2 3L [place]  Not in acute exacerbation Continue home oxygen  3 L as needed Home inhaler resumed   Essential hypertension Home diltiazem  120 mg p.o. daily    GERD (gastroesophageal reflux disease) Home PPI    Hyperlipidemia Home atorvastatin  80 mg nightly   Generalized weakness PT/OT eval   overweight based on BMI: Body mass index is 27.43 kg/m.[name] Significantly low or high BMI is associated with higher medical risk.  Underweight - under 18  overweight - 25 to 29 obese - 30 or more Class 1 obesity: BMI of 30.0  to 34 Class 2 obesity: BMI of 35.0 to 39 Class 3 obesity: BMI of 40.0 to 49 Super Morbid Obesity: BMI 50-59 Super-super Morbid Obesity: BMI 60+ Healthy nutrition and physical activity advised as adjunct to other disease management and risk reduction treatments    DVT prophylaxis: Eliquis  IV fluids: no continuous IV fluids  Nutrition: cardiac Central lines / other devices: none  Code Status: FULL CODE ACP documentation reviewed:  none on file in [facility]  TOC needs: TBD Medical barriers to dispo: cardiac monitoring post cardioversion. Expected medical readiness for discharge tomorrow.

## 2024-09-15 NOTE — Consult Note (Signed)
 Cardiology Consultation   Patient ID: Bryan Kelley MRN: [id number]; DOB: 09/17/1956  Admit date: 09/15/2024 Date of Consult: 09/15/2024  PCP:  [employer]   [facility] Providers Cardiologist:  Alm Clay, MD  Electrophysiology APP:  Riddle, Suzann, NP   Physician requesting consult : Dr. Sherre   Patient Profile: Bryan Kelley is a 68 y.o. male with a hx of COPD on home 3 L, GERD, hypertension, atrial fibrillation/flutter presenting with tachycardia, shortness of breath, recurrence of his atrial flutter  History of Present Illness: Bryan Kelley was recently hospitalized October 9 with discharge October 14 for atrial flutter requiring TEE cardioversion, normal sinus rhythm restored  Reports that he was in good state of health until 1 week ago started developing worsening shortness of breath Presented to see primary care today, they noted he was more short of breath with tachycardia and was referred to the emergency room  EKG in the ER documenting atrial flutter with rapid rate, mild improvement in rate with diltiazem  boluses and infusion limited secondary to hypotension  He reports compliance with his Eliquis , does his own medications, takes this twice a day  Reports he is wheezy, requesting to use his inhaler  Other hospitalization September 25 through September 30 for inferior STEMI, COPD exacerbation, atrial fibrillation with RVR Cardiac cath showed nonocclusive CAD, most notable lesion was a small/diminutive 90% stenosis but no occlusive disease. STEMI diagnosis excluded. Afib noted in the cath lab with rates up to 150 s/p adenosine  started on IV dilt. CHADSVASC of 3. IV heparin  was changed to Eliquis . IV dilt was changed to oral dilt. Echo showed LVEF 65-70%, no WMA, G1DD. The patient converted to NSR prior to discharge.    Past Medical History:  Diagnosis Date   Acute bronchitis 05/07/2009   Qualifier: Diagnosis of   By: Antonetta MD, Margaret          Acute on chronic hypoxic respiratory failure (HCC) 10/06/2020   Acute on chronic respiratory failure with hypercapnia (HCC) 12/06/2019   Acute on chronic respiratory failure with hypoxia and hypercapnia (HCC) 07/28/2024   Acute respiratory disease due to COVID-19 virus 12/06/2019   Asthma    COPD (chronic obstructive pulmonary disease) (HCC)    COPD with acute exacerbation (HCC) 10/29/2019   Depression    GERD (gastroesophageal reflux disease)    Inferior ST segment elevation 07/28/2024   Sinus tachycardia 07/30/2024   Vision loss, left eye     Past Surgical History:  Procedure Laterality Date   CARDIOVERSION N/A 08/16/2024   Procedure: CARDIOVERSION;  Surgeon: Darliss Rogue, MD;  Location: [facility] ORS;  Service: Cardiovascular;  Laterality: N/A;   EYE SURGERY  2009, about [id number],8010   steel in left eye on the job, legally blind    LEFT HEART CATH AND CORONARY ANGIOGRAPHY N/A 07/28/2024   Procedure: LEFT HEART CATH AND CORONARY ANGIOGRAPHY;  Surgeon: Clay Alm ORN, MD;  Location: [facility] INVASIVE CV LAB;  Service: Cardiovascular;  Laterality: N/A;   TEE WITHOUT CARDIOVERSION N/A 08/16/2024   Procedure: ECHOCARDIOGRAM, TRANSESOPHAGEAL;  Surgeon: Darliss Rogue, MD;  Location: [facility] ORS;  Service: Cardiovascular;  Laterality: N/A;     Home Medications:  Prior to Admission medications   Medication Sig Start Date End Date Taking? Authorizing Provider  albuterol  (VENTOLIN  HFA) 108 (90 Base) MCG/ACT inhaler Inhale 2 puffs into the lungs every 6 (six) hours as needed for wheezing or shortness of breath. 11/30/19  Yes Edelmiro, [place], MD  apixaban  (ELIQUIS ) 5 MG TABS  tablet Take 1 tablet (5 mg total) by mouth 2 (two) times daily. 08/02/24  Yes Wieting, Richard, MD  atorvastatin  (LIPITOR ) 80 MG tablet Take 1 tablet (80 mg total) by mouth daily. 08/02/24  Yes Wieting, Richard, MD  brimonidine  (ALPHAGAN ) 0.2 % ophthalmic solution Place 1 drop into the left eye 2 (two) times daily. 04/05/24   Yes [provider]  brinzolamide  (AZOPT ) 1 % ophthalmic suspension Place 1 drop into the left eye 2 (two) times daily. 04/04/24  Yes [provider]  ipratropium-albuterol  (DUONEB) 0.5-2.5 (3) MG/3ML SOLN Take 3 mLs by nebulization 4 (four) times daily as needed. 06/03/24  Yes [provider]  polyethylene glycol (MIRALAX  / GLYCOLAX ) 17 g packet Take 17 g by mouth 2 (two) times daily. 08/02/24  Yes Wieting, Richard, MD  sildenafil (VIAGRA) 100 MG tablet Take 100 mg by mouth as needed for erectile dysfunction. 07/26/24  Yes [provider]  tamsulosin  (FLOMAX ) 0.4 MG CAPS capsule Take 1 capsule (0.4 mg total) by mouth daily. 07/05/23  Yes Danton Reyes DASEN, MD  acetaminophen  (TYLENOL ) 325 MG tablet Take 2 tablets (650 mg total) by mouth every 6 (six) hours as needed for mild pain, fever or headache. Patient not taking: Reported on 09/15/2024 07/05/23   Danton Reyes DASEN, MD  budesonide -formoterol  (SYMBICORT ) 80-4.5 MCG/ACT inhaler Inhale 2 puffs into the lungs 2 (two) times daily. Patient not taking: Reported on 09/15/2024 08/16/24   Kandis Devaughn Sayres, MD  diltiazem  (CARDIZEM  CD) 120 MG 24 hr capsule Take 1 capsule (120 mg total) by mouth daily. Patient not taking: Reported on 09/15/2024 08/02/24   Josette Ade, MD  lidocaine  (LIDODERM ) 5 % Place 1 patch onto the skin every 12 (twelve) hours. Remove & Discard patch within 12 hours or as directed by MD.  Ginnie the patch off for 12 hours before applying a new one. Patient not taking: Reported on 09/15/2024 08/29/24 08/29/25  Gordan Huxley, MD  montelukast  (SINGULAIR ) 10 MG tablet Take 1 tablet (10 mg total) by mouth at bedtime. 08/02/24 09/01/24  Josette Ade, MD  pantoprazole  (PROTONIX ) 20 MG tablet Take 1 tablet (20 mg total) by mouth daily. 08/02/24 09/01/24  Josette Ade, MD  theophylline  (UNIPHYL) 400 MG 24 hr tablet Take 1 tablet (400 mg total) by mouth at bedtime. Patient not taking: Reported on 08/10/2024  08/02/24   Josette Ade, MD    Scheduled Meds:  apixaban   5 mg Oral BID   [START ON 09/16/2024] atorvastatin   80 mg Oral Daily   [START ON 09/16/2024] diltiazem   120 mg Oral Daily   fluticasone  furoate-vilanterol  1 puff Inhalation Daily   montelukast   10 mg Oral QHS   pantoprazole   20 mg Oral Daily   Continuous Infusions:  diltiazem  (CARDIZEM ) infusion 15 mg/hr (09/15/24 1534)   PRN Meds: acetaminophen  **OR** acetaminophen , hydrALAZINE , ipratropium-albuterol , ondansetron  **OR** ondansetron  (ZOFRAN ) IV, polyethylene glycol, senna-docusate  Allergies:    Allergies  Allergen Reactions   Aspirin     Social History:   Social History   Socioeconomic History   Marital status: Legally Separated    Spouse name: Not on file   Number of children: Not on file   Years of education: Not on file   Highest education level: Not on file  Occupational History   Not on file  Tobacco Use   Smoking status: Former    Current packs/day: 0.00    Average packs/day: 0.5 packs/day for 49.0 years (24.5 ttl pk-yrs)    Types: Cigarettes  Start date: 03/1973    Quit date: 03/2022    Years since quitting: 2.5   Smokeless tobacco: Never  Vaping Use   Vaping status: Never Used  Substance and Sexual Activity   Alcohol  use: Yes    Alcohol /week: 2.0 standard drinks of alcohol     Types: 2 Cans of beer per week   Drug use: No   Sexual activity: Not on file  Other Topics Concern   Not on file  Social History Narrative   Not on file   Social Drivers of Health   Financial Resource Strain: Not on file  Food Insecurity: Patient Declined (08/11/2024)   Hunger Vital Sign    Worried About Running Out of Food in the Last Year: Patient declined    Ran Out of Food in the Last Year: Patient declined  Transportation Needs: Patient Declined (08/11/2024)   PRAPARE - Administrator, Civil Service (Medical): Patient declined    Lack of Transportation (Non-Medical): Patient declined  Physical  Activity: Not on file  Stress: Not on file  Social Connections: Patient Declined (08/11/2024)   Social Connection and Isolation Panel    Frequency of Communication with Friends and Family: Patient declined    Frequency of Social Gatherings with Friends and Family: Patient declined    Attends Religious Services: Patient declined    Database Administrator or Organizations: Patient declined    Attends Banker Meetings: Patient declined    Marital Status: Patient declined  Intimate Partner Violence: Patient Declined (08/11/2024)   Humiliation, Afraid, Rape, and Kick questionnaire    Fear of Current or Ex-Partner: Patient declined    Emotionally Abused: Patient declined    Physically Abused: Patient declined    Sexually Abused: Patient declined    Family History:    Family History  Problem Relation Age of Onset   Diabetes Mother    Hypertension Mother    Stroke Mother    Asthma Father    Diabetes Father    Diabetes Sister    Hypertension Sister    Diabetes Brother    Hypertension Brother      ROS:  Please see the history of present illness.  Review of Systems  Constitutional: Negative.   HENT: Negative.    Respiratory:  Positive for shortness of breath.   Cardiovascular: Negative.   Gastrointestinal: Negative.   Musculoskeletal: Negative.   Neurological: Negative.   Psychiatric/Behavioral: Negative.    All other systems reviewed and are negative.  Physical Exam/Data: Vitals:   09/15/24 1400 09/15/24 1415 09/15/24 1430 09/15/24 1445  BP:      Pulse: (!) 153 (!) 157 71 75  Resp: (!) 37 (!) 27 (!) 24 (!) 24  Temp:      TempSrc:      SpO2: 100% 100% 100% (!) 89%  Weight:      Height:       No intake or output data in the 24 hours ending 09/15/24 1537    09/15/2024   12:50 PM 08/28/2024    9:17 PM 08/10/2024    2:49 PM  Last 3 Weights  Weight (lbs) 170 lb 180 lb 180 lb  Weight (kg) 77.111 kg 81.647 kg 81.647 kg     Body mass index is 27.44 kg/m.   General:  Well nourished, well developed, in no acute distress HEENT: normal Neck: no JVD Vascular: No carotid bruits; Distal pulses 2+ bilaterally Cardiac:  normal S1, S2; RRR; no murmur  Lungs:  clear  to auscultation bilaterally, no wheezing, rhonchi or rales  Abd: soft, nontender, no hepatomegaly  Ext: no edema Musculoskeletal:  No deformities, BUE and BLE strength normal and equal Skin: warm and dry  Neuro:  CNs 2-12 intact, no focal abnormalities noted Psych:  Normal affect   EKG:  The EKG was personally reviewed and demonstrates: Atrial flutter with rate 140 bpm  Telemetry:  Telemetry was personally reviewed and demonstrates: Atrial fibrillation/flutter with a rate 120 up to 140 bpm  Relevant CV Studies:   Laboratory Data: High Sensitivity Troponin:   Recent Labs  Lab 08/28/24 2119  TROPONINIHS 7     Chemistry Recent Labs  Lab 09/15/24 1251  NA 137  K 3.9  CL 101  CO2 26  GLUCOSE 119*  BUN 12  CREATININE 0.84  CALCIUM  9.4  GFRNONAA >60  ANIONGAP 10    No results for input(s): PROT, ALBUMIN, AST, ALT, ALKPHOS, BILITOT in the last 168 hours. Lipids No results for input(s): CHOL, TRIG, HDL, LABVLDL, LDLCALC, CHOLHDL in the last 168 hours.  Hematology Recent Labs  Lab 09/15/24 1251  WBC 7.5  RBC 4.23  HGB 12.0*  HCT 37.4*  MCV 88.4  MCH 28.4  MCHC 32.1  RDW 13.6  PLT 321   Thyroid No results for input(s): TSH, FREET4 in the last 168 hours.  BNPNo results for input(s): BNP, PROBNP in the last 168 hours.  DDimer No results for input(s): DDIMER in the last 168 hours.  Radiology/Studies:  DG Chest 2 View Result Date: 09/15/2024 EXAM: 2 VIEW(S) XRAY OF THE CHEST 09/15/2024 01:23:46 PM COMPARISON: 08/28/2024 CLINICAL HISTORY: shortness of breath FINDINGS: LINES, TUBES AND DEVICES: Multiple wires and leads project over the chest on the frontal radiograph. LUNGS AND PLEURA: Similar left base airspace disease. Tiny left  pleural effusion. No pneumothorax. HEART AND MEDIASTINUM: Transverse aortic atherosclerosis. BONES AND SOFT TISSUES: No acute osseous abnormality. IMPRESSION: 1. Tiny left pleural effusion with similar adjacent left lower lobe airspace disease. This could represent atelectasis or early infection. Electronically signed by: Rockey Kilts MD 09/15/2024 02:02 PM EST RP Workstation: [id number]    Assessment and Plan: Atrial flutter with rapid rate Also history of atrial fibrillation Recent cardioversion mid October 2025, normal sinus rhythm restored at that time -Was unable to establish with EP as an outpatient - Worsening shortness of breath over the past week likely associated with recurrence of his atrial flutter with rapid rate, he was otherwise unaware of recurrence of his flutter - Reports compliance with his Eliquis  5 twice daily and diltiazem  ER 120 daily - Given low blood pressure, rapid rate on diltiazem  infusion, we will start  amiodarone bolus with infusion - Will continue low-dose diltiazem  infusion with the amiodarone simultaneously - Plan for cardioversion in the morning 7:30Am 09/16/24  with Dr. Argentina  History of smoking, COPD Reports wheezing, likely exacerbated by atrial flutter with rapid rate -Will pursue aggressive rate control as above - May need Xopenex nebulizer, prednisone  - If symptoms get worse, may need urgent cardioversion in the ER  3.  Coronary artery disease, nonobstructive on catheterization September 2025 No further ischemic workup needed at this time  Essential hypertension Continue diltiazem  infusion, hold oral diltiazem  at this time  Hyperlipidemia Continue Lipitor  80 daily   For questions or updates, please contact [facility] Please consult [email or web address] for contact info under    Signed, Dartagnan Beavers, MD  09/15/2024 3:37 PM

## 2024-09-15 NOTE — H&P (Signed)
 History and Physical   STRYKER VEASEY FMW:[id number] DOB: 10/26/1956 DOA: 09/15/2024  PCP: [employer]  Patient coming from: [facility]  I have personally briefly reviewed patient's old medical records in [facility] EMR.  Chief Concern: Shortness of breath, increased heart rate  HPI: [name] is a 68 year old male with history of hyperlipidemia, hypertension, atrial flutter, and atrial fibrillation on anticoagulation, GERD, BPH, erectile dysfunction, COPD on chronic 3 L nasal cannula, who presents to the ED for chief concerns of increased heart rate and shortness of breath at [facility].  Vitals in the ED showed t of 98.1, rr 24, hr 154, blood pressure 135/108, SpO2 92% on 3 L nasal cannula.  Serum sodium is 137, potassium 3.9, chloride 101, bicarb 26, BUN of 12, serum creatinine 0.84, eGFR greater than 60, nonfasting blood glucose 119, WBC 7.5, hemoglobin 12, platelets 321.  HS troponin is 20.  ED treatment: Diltiazem  10 mg IV one-time dose, diltiazem  15 mg IV one-time dose, diltiazem  gtt., Xopenex nebulizer, prednisone  40 mg PO one-time dose. ------------------------------------- At bedside, patient is able to tell me his first and last name, age, location, current calendar year.  Reports that he took all his medications this morning prior to presenting to the [facility].  He reports that this visit is a regular checkup.  He reports he was told that his heart rate was high and therefore he needed to go to the emergency department.  He denies left lower anterior chest wall discomfort this morning.  He endorses some shortness of breath.  He denies nausea, vomiting, abdominal pain, dysuria, hematuria, diarrhea, swelling of his lower extremity.  He denies trauma to his person.  Social history: He lives at home.  He denies tobacco, EtOH, recreational drug use.  He is retired.  ROS: Constitutional: no weight change, no  fever ENT/Mouth: no sore throat, no rhinorrhea Eyes: no eye pain, no vision changes Cardiovascular: no chest pain, no dyspnea,  no edema, no palpitations Respiratory: no cough, no sputum, no wheezing Gastrointestinal: no nausea, no vomiting, no diarrhea, no constipation Genitourinary: no urinary incontinence, no dysuria, no hematuria Musculoskeletal: no arthralgias, no myalgias Skin: no skin lesions, no pruritus, Neuro: no weakness, no loss of consciousness, no syncope Psych: no anxiety, no depression, no decrease appetite Heme/Lymph: no bruising, no bleeding  ED Course: Discussed with EDP, patient requiring hospitalization for chief concerns of atrial flutter.  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 Patient is status post cardioversion on 08/16/2024 Cardiology has been consulted and plans for cardioversion tomorrow N.p.o. after midnight Home diltiazem  120 mg p.o. daily resume  CAD , nonobstructive on cath 07/28/2024 Heart cath on 07/28/2024: No PCI done.  Recommended to resume home medication including losartan , rosuvastatin, and home anticoagulation.  COPD (chronic obstructive pulmonary disease) (HCC) Not in acute exacerbation Continue oxygen  3 L as needed Home long-acting inhaler equivalent resumed  Essential hypertension Home diltiazem  120 mg p.o. daily resumed Tolazine 5 mg IV every 6 hours as needed for SBP > 160, 5 days ordered  GERD (gastroesophageal reflux disease) Home PPI resumed  Hyperlipidemia Home atorvastatin  80 mg nightly resume  Chart reviewed.   DVT prophylaxis: Eliquis  5 mg p.o. twice daily  resumed Code Status: Full code Diet: Heart healthy; n.p.o. after midnight Family Communication: No Disposition Plan: Pending clinical course Consults called: Cardiology Admission status: PCU, observation  Past Medical History:  Diagnosis Date   Acute bronchitis 05/07/2009   Qualifier: Diagnosis of   By: Antonetta MD, Margaret         Acute on chronic hypoxic respiratory failure (HCC) 10/06/2020   Acute on chronic respiratory failure with hypercapnia (HCC) 12/06/2019   Acute on chronic respiratory failure with hypoxia and hypercapnia (HCC) 07/28/2024   Acute respiratory disease due to COVID-19 virus 12/06/2019   Asthma    COPD (chronic obstructive pulmonary disease) (HCC)    COPD with acute exacerbation (HCC) 10/29/2019   Depression    GERD (gastroesophageal reflux disease)    Inferior ST segment elevation 07/28/2024   Sinus tachycardia 07/30/2024   Vision loss, left eye    Past Surgical History:  Procedure Laterality Date   CARDIOVERSION N/A 08/16/2024   Procedure: CARDIOVERSION;  Surgeon: Darliss Rogue, MD;  Location: [facility] ORS;  Service: Cardiovascular;  Laterality: N/A;   EYE SURGERY  2009, about [phone]   steel in left eye on the job, legally blind    LEFT HEART CATH AND CORONARY ANGIOGRAPHY N/A 07/28/2024   Procedure: LEFT HEART CATH AND CORONARY ANGIOGRAPHY;  Surgeon: Anner Alm ORN, MD;  Location: [facility] INVASIVE CV LAB;  Service: Cardiovascular;  Laterality: N/A;   TEE WITHOUT CARDIOVERSION N/A 08/16/2024   Procedure: ECHOCARDIOGRAM, TRANSESOPHAGEAL;  Surgeon: Darliss Rogue, MD;  Location: [facility] ORS;  Service: Cardiovascular;  Laterality: N/A;   Social History:  reports that he quit smoking about 2 years ago. His smoking use included cigarettes. He started smoking about 51 years ago. He has a 24.5 pack-year smoking history. He has never used smokeless tobacco. He reports current alcohol  use of about 2.0 standard drinks of alcohol  per week. He reports that he does  not use drugs.  Allergies  Allergen Reactions   Aspirin    Family History  Problem Relation Age of Onset   Diabetes Mother    Hypertension Mother    Stroke Mother    Asthma Father    Diabetes Father    Diabetes Sister    Hypertension Sister    Diabetes Brother    Hypertension Brother    Family history: Family history reviewed and not pertinent.  Prior to Admission medications   Medication Sig Start Date End Date Taking? Authorizing Provider  acetaminophen  (TYLENOL ) 325 MG tablet Take 2 tablets (650 mg total) by mouth every 6 (six) hours as needed for mild pain, fever or headache. 07/05/23   Danton Reyes DASEN, MD  albuterol  (VENTOLIN  HFA) 108 [phone] Base) MCG/ACT inhaler Inhale 2 puffs into the lungs every 6 (six) hours as needed for wheezing or shortness of breath. 11/30/19   Edelmiro, [place], MD  apixaban  (ELIQUIS ) 5 MG TABS tablet Take 1 tablet (5 mg total) by mouth 2 (two) times daily. Patient not taking: Reported on 08/10/2024 08/02/24   Josette Ade, MD  atorvastatin  (LIPITOR ) 80 MG tablet Take 1 tablet (80 mg total) by mouth daily. 08/02/24   Josette Ade, MD  brimonidine  (ALPHAGAN ) 0.2 % ophthalmic solution Place 1 drop into the left eye 2 (two) times daily. 04/05/24   [provider]  brinzolamide  (AZOPT ) 1 % ophthalmic suspension Place 1 drop into the left eye 2 (two) times daily. Patient not taking: Reported on 09/15/2024 04/04/24  [provider]  budesonide -formoterol  (SYMBICORT ) 80-4.5 MCG/ACT inhaler Inhale 2 puffs into the lungs 2 (two) times daily. Patient not taking: Reported on 09/15/2024 08/16/24   Kandis Devaughn Sayres, MD  diltiazem  (CARDIZEM  CD) 120 MG 24 hr capsule Take 1 capsule (120 mg total) by mouth daily. 08/02/24   Josette Ade, MD  ipratropium-albuterol  (DUONEB) 0.5-2.5 (3) MG/3ML SOLN Take 3 mLs by nebulization 4 (four) times daily as needed. 06/03/24   [provider]  lidocaine  (LIDODERM ) 5 % Place 1 patch onto the skin every  12 (twelve) hours. Remove & Discard patch within 12 hours or as directed by MD.  Ginnie the patch off for 12 hours before applying a new one. Patient not taking: Reported on 09/15/2024 08/29/24 08/29/25  Gordan Huxley, MD  montelukast  (SINGULAIR ) 10 MG tablet Take 1 tablet (10 mg total) by mouth at bedtime. 08/02/24 09/01/24  Josette Ade, MD  pantoprazole  (PROTONIX ) 20 MG tablet Take 1 tablet (20 mg total) by mouth daily. 08/02/24 09/01/24  Josette Ade, MD  polyethylene glycol (MIRALAX  / GLYCOLAX ) 17 g packet Take 17 g by mouth 2 (two) times daily. 08/02/24   Josette Ade, MD  sildenafil (VIAGRA) 100 MG tablet Take 100 mg by mouth as needed for erectile dysfunction. 07/26/24   [provider]  tamsulosin  (FLOMAX ) 0.4 MG CAPS capsule Take 1 capsule (0.4 mg total) by mouth daily. 07/05/23   Danton Reyes DASEN, MD  theophylline  (UNIPHYL) 400 MG 24 hr tablet Take 1 tablet (400 mg total) by mouth at bedtime. Patient not taking: Reported on 08/10/2024 08/02/24   Josette Ade, MD   Physical Exam: Vitals:   09/15/24 1400 09/15/24 1415 09/15/24 1430 09/15/24 1445  BP:      Pulse: (!) 153 (!) 157 71 75  Resp: (!) 37 (!) 27 (!) 24 (!) 24  Temp:      TempSrc:      SpO2: 100% 100% 100% (!) 89%  Weight:      Height:       Constitutional: appears age-appropriate, NAD, calm Eyes: PERRL, lids and conjunctivae normal ENMT: Mucous membranes are moist. Posterior pharynx clear of any exudate or lesions. Age-appropriate dentition. Hearing appropriate Neck: normal, supple, no masses, no thyromegaly Respiratory: clear to auscultation bilaterally, no wheezing, no crackles. Normal respiratory effort. No accessory muscle use.  Cardiovascular: Regular rate and rhythm, no murmurs / rubs / gallops. No extremity edema. 2+ pedal pulses. No carotid bruits.  Abdomen: no tenderness, no masses palpated, no hepatosplenomegaly. Bowel sounds positive.  Musculoskeletal: no clubbing / cyanosis. No joint  deformity upper and lower extremities. Good ROM, no contractures, no atrophy. Normal muscle tone.  Skin: no rashes, lesions, ulcers. No induration Neurologic: Sensation intact. Strength 5/5 in all 4.  Psychiatric: Normal judgment and insight. Alert and oriented x 3. Normal mood.   EKG: independently reviewed, showing atrial flutter with 2:1 AV conduction, rate of 155, QTc 411  Chest x-ray on Admission: I personally reviewed and I agree with radiologist reading as below.  DG Chest 2 View Result Date: 09/15/2024 EXAM: 2 VIEW(S) XRAY OF THE CHEST 09/15/2024 01:23:46 PM COMPARISON: 08/28/2024 CLINICAL HISTORY: shortness of breath FINDINGS: LINES, TUBES AND DEVICES: Multiple wires and leads project over the chest on the frontal radiograph. LUNGS AND PLEURA: Similar left base airspace disease. Tiny left pleural effusion. No pneumothorax. HEART AND MEDIASTINUM: Transverse aortic atherosclerosis. BONES AND SOFT TISSUES: No acute osseous abnormality. IMPRESSION: 1. Tiny left pleural effusion with similar adjacent left lower lobe airspace disease.  This could represent atelectasis or early infection. Electronically signed by: Rockey Kilts MD 09/15/2024 02:02 PM EST RP Workstation: [id number]   Labs on Admission: I have personally reviewed following labs  CBC: Recent Labs  Lab 09/15/24 1251  WBC 7.5  HGB 12.0*  HCT 37.4*  MCV 88.4  PLT 321   Basic Metabolic Panel: Recent Labs  Lab 09/15/24 1251  NA 137  K 3.9  CL 101  CO2 26  GLUCOSE 119*  BUN 12  CREATININE 0.84  CALCIUM  9.4   GFR: Estimated Creatinine Clearance: 83.4 mL/min (by C-G formula based on SCr of 0.84 mg/dL).  This document was prepared using Dragon Voice Recognition software and may include unintentional dictation errors.  Dr. Sherre [facility]  If 7PM-7AM, please contact overnight-coverage provider If 7AM-7PM, please contact day attending provider [email or web address]  09/15/2024, 2:58 PM

## 2024-09-15 NOTE — ED Provider Notes (Signed)
 [facility] Provider Note    Event Date/Time   First MD Initiated Contact with Patient 09/15/24 1312     (approximate)   History   Shortness of Breath and Tachycardia   HPI  Bryan Kelley is a 68 y.o. male history of atrial flutter hypertension alcohol  use asthma COPD and previous concerns for acute cardiac disease   Patient reports starting about 3 days ago he started having a slight feeling of shortness of breath.  No fever he has had a bit of a cough though nonproductive.  He went to his doctor to get this checked out today he reports they told him his heart rate was really fast.  He was sent to the hospital for that reason.  Patient reports he did not realize his heart was racing.  He has been taking his prescribed medication including Eliquis  every day in the morning and at night for the last couple weeks  No chest pain except when he takes a deep breath he will get a little bit achiness under his left rib.  No fever no bodyaches  Physical Exam   Triage Vital Signs: ED Triage Vitals  Encounter Vitals Group     BP 09/15/24 1250 (!) 135/108     Girls Systolic BP Percentile --      Girls Diastolic BP Percentile --      Boys Systolic BP Percentile --      Boys Diastolic BP Percentile --      Pulse Rate 09/15/24 1248 (!) 154     Resp 09/15/24 1248 (!) 24     Temp 09/15/24 1248 98.1 F (36.7 C)     Temp Source 09/15/24 1248 Oral     SpO2 09/15/24 1248 92 %     Weight 09/15/24 1250 170 lb (77.1 kg)     Height 09/15/24 1250 5' 6 (1.676 m)     Head Circumference --      Peak Flow --      Pain Score 09/15/24 1248 6     Pain Loc --      Pain Education --      Exclude from Growth Chart --     Most recent vital signs: Vitals:   09/15/24 1430 09/15/24 1445  BP:    Pulse: 71 75  Resp: (!) 24 (!) 24  Temp:    SpO2: 100% (!) 89%     General: Awake, no distress.  He is pleasant his heart rate is quite tachycardic but he does not appear to be in  any distress. CV:  Good peripheral perfusion.  Regular tachycardia.  No murmur Resp:  Normal effort.  Mild expiratory wheezing.  No noted cough does not appear in distress speaks in full clear sentences. Abd:  No distention.  Soft nontender nondistended Other:  No lower extreme edema venous cords or congestion   ED Results / Procedures / Treatments   Labs (all labs ordered are listed, but only abnormal results are displayed) Labs Reviewed  BASIC METABOLIC PANEL WITH GFR - Abnormal; Notable for the following components:      Result Value   Glucose, Bld 119 (*)    All other components within normal limits  CBC - Abnormal; Notable for the following components:   Hemoglobin 12.0 (*)    HCT 37.4 (*)    All other components within normal limits  TROPONIN T, HIGH SENSITIVITY - Abnormal; Notable for the following components:   Troponin T High Sensitivity 20 (*)  All other components within normal limits  RESP PANEL BY RT-PCR (RSV, FLU A&B, COVID)  [id number]  PROTIME-INR  TROPONIN T, HIGH SENSITIVITY    RADIOLOGY  Chest x-ray inter by me is negative for acute  DG Chest 2 View Result Date: 09/15/2024 EXAM: 2 VIEW(S) XRAY OF THE CHEST 09/15/2024 01:23:46 PM COMPARISON: 08/28/2024 CLINICAL HISTORY: shortness of breath FINDINGS: LINES, TUBES AND DEVICES: Multiple wires and leads project over the chest on the frontal radiograph. LUNGS AND PLEURA: Similar left base airspace disease. Tiny left pleural effusion. No pneumothorax. HEART AND MEDIASTINUM: Transverse aortic atherosclerosis. BONES AND SOFT TISSUES: No acute osseous abnormality. IMPRESSION: 1. Tiny left pleural effusion with similar adjacent left lower lobe airspace disease. This could represent atelectasis or early infection. Electronically signed by: Rockey Kilts MD 09/15/2024 02:02 PM EST RP Workstation: [id number]   Radiology over read notes a tiny left pleural effusion and possible left lower lobe airspace  disease   PROCEDURES:  Critical Care performed: Yes, see critical care procedure note(s)  Procedures   MEDICATIONS ORDERED IN ED: Medications  diltiazem  (CARDIZEM ) 1 mg/mL load via infusion 10 mg (10 mg Intravenous Bolus from Bag 09/15/24 1411)    And  diltiazem  (CARDIZEM ) 125 mg in dextrose  5% 125 mL (1 mg/mL) infusion (15 mg/hr Intravenous Rate/Dose Change 09/15/24 1534)  atorvastatin  (LIPITOR ) tablet 80 mg (has no administration in time range)  diltiazem  (CARDIZEM  CD) 24 hr capsule 120 mg (has no administration in time range)  pantoprazole  (PROTONIX ) EC tablet 20 mg (20 mg Oral Given 09/15/24 1624)  polyethylene glycol (MIRALAX  / GLYCOLAX ) packet 17 g (has no administration in time range)  fluticasone  furoate-vilanterol (BREO ELLIPTA ) 100-25 MCG/ACT 1 puff (1 puff Inhalation Patient Refused/Not Given 09/15/24 1624)  ipratropium-albuterol  (DUONEB) 0.5-2.5 (3) MG/3ML nebulizer solution 3 mL (has no administration in time range)  montelukast  (SINGULAIR ) tablet 10 mg (has no administration in time range)  acetaminophen  (TYLENOL ) tablet 650 mg (has no administration in time range)    Or  acetaminophen  (TYLENOL ) suppository 650 mg (has no administration in time range)  ondansetron  (ZOFRAN ) tablet 4 mg (has no administration in time range)    Or  ondansetron  (ZOFRAN ) injection 4 mg (has no administration in time range)  senna-docusate (Senokot-S) tablet 1 tablet (has no administration in time range)  hydrALAZINE  (APRESOLINE ) injection 5 mg (has no administration in time range)  amiodarone (NEXTERONE) 1.8 mg/mL load via infusion 150 mg (has no administration in time range)    Followed by  amiodarone (NEXTERONE PREMIX) 360-4.14 MG/200ML-% (1.8 mg/mL) IV infusion (has no administration in time range)    Followed by  amiodarone (NEXTERONE PREMIX) 360-4.14 MG/200ML-% (1.8 mg/mL) IV infusion (has no administration in time range)  apixaban  (ELIQUIS ) tablet 5 mg (has no administration in time  range)  diltiazem  (CARDIZEM ) injection 15 mg (15 mg Intravenous Given 09/15/24 1340)  levalbuterol (XOPENEX) nebulizer solution 0.63 mg (0.63 mg Nebulization Given 09/15/24 1337)  predniSONE  (DELTASONE ) tablet 40 mg (40 mg Oral Given 09/15/24 1336)     IMPRESSION / MDM / ASSESSMENT AND PLAN / ED COURSE  I reviewed the triage vital signs and the nursing notes.                              Differential diagnosis includes, but is not limited to, with his mild wheezing and preceding shortness of breath consideration for COPD exacerbation, pneumonia, or pulmonary disease is considered.  Chest x-ray without obvious infiltrate [  later over read as possible small left lower infiltrate, for which I sent notification to admitting hospitalist-Dr. Cox acknowledged this message], atrial flutter etc. CHF etc. are considered.  Most recent echo showed normal biventricular function.  He is anticoagulated.  He does have A-fib with rapid ventricular response.  He does not report any systemic symptoms such as fevers and chills, reports a cough and has wheezing but his white count is normal.  He is not febrile.  This seems to argue somewhat against acute pneumonia, though could be present.   I discussed with cardiology Dr. Gollan, he advised to reasonable options 1 would be ED cardioversion the other would be to work to put the patient on the schedule for cardioversion possibly tomorrow.  I discussed with the patient risks benefits plan for how cardioversion will be performed, and patient reports that his #1 priority right now is that he would like something to eat he would like to defer the idea of shocking his heart till tomorrow.  He is comfortable with receiving IV medication.  Additionally though some of his symptoms make me think it is quite possible that he may be have been in A-fib for many days now, and he does not have a distinct point where he clearly point in to arrhythmia.  He would be relatively low risk for  ED cardioversion unless he reports significant compliance with medication  Patient's presentation is most consistent with acute presentation with potential threat to life or bodily function.  Reviewed medical records recent hospitalizations.  He has had previous CTA to rule out PE within about the last month.  He is anticoag coagulated   Reviewed previous discharge and admissions including discharge for chest pain and cardiac disease but then ultimately had catheterization showing nonobstructive coronary disease.  A-fib RVR, TEE, cardioversion   The patient is on the cardiac monitor to evaluate for evidence of arrhythmia and/or significant heart rate changes.  Consulted with cardiology Dr. Perla  Clinical Course as of 09/15/24 1627  Thu Sep 15, 2024  1312 EKG inter by me at 1300 heart rate 150 QRS 80 QTc 420 Suspicious for atrial flutter with 2-1 conduction.  Extreme tachycardia.  Nonspecific T wave abnormality felt likely rate related but cannot exclude ischemia [MQ]    Clinical Course User Index [MQ] Dicky Anes, MD   Consulted with patient accepted to hospital service at the care of Dr. Sherre  FINAL CLINICAL IMPRESSION(S) / ED DIAGNOSES   Final diagnoses:  Atrial fibrillation with RVR (HCC)  COPD with acute exacerbation [facility])     Rx / DC Orders   ED Discharge Orders          Ordered    Amb referral to [facility]        09/15/24 1438             Note:  This document was prepared using Dragon voice recognition software and may include unintentional dictation errors.   Dicky Anes, MD 09/15/24 [phone]

## 2024-09-15 NOTE — ED Triage Notes (Signed)
 Patient states he was sent over by [facility] for A-flutter and increased shortness of breath. Patient on baseline 3L [place].

## 2024-09-16 ENCOUNTER — Observation Stay: Admitting: Certified Registered Nurse Anesthetist

## 2024-09-16 ENCOUNTER — Encounter
Admission: EM | Disposition: A | Discharge status: Home / Self Care | Payer: Self-pay | Source: Home / Self Care | Attending: Osteopathic Medicine

## 2024-09-16 DIAGNOSIS — I2510 Atherosclerotic heart disease of native coronary artery without angina pectoris: Secondary | ICD-10-CM

## 2024-09-16 DIAGNOSIS — I4891 Unspecified atrial fibrillation: Secondary | ICD-10-CM | POA: Diagnosis not present

## 2024-09-16 DIAGNOSIS — I4892 Unspecified atrial flutter: Secondary | ICD-10-CM | POA: Diagnosis not present

## 2024-09-16 DIAGNOSIS — I483 Typical atrial flutter: Secondary | ICD-10-CM | POA: Diagnosis not present

## 2024-09-16 HISTORY — PX: CARDIOVERSION: SHX1299

## 2024-09-16 LAB — CBC
HCT: 32.2 % — ABNORMAL LOW (ref 39.0–52.0)
Hemoglobin: 10.5 g/dL — ABNORMAL LOW (ref 13.0–17.0)
MCH: 28.8 pg (ref 26.0–34.0)
MCHC: 32.6 g/dL (ref 30.0–36.0)
MCV: 88.5 fL (ref 80.0–100.0)
Platelets: 294 K/uL (ref 150–400)
RBC: 3.64 MIL/uL — ABNORMAL LOW (ref 4.22–5.81)
RDW: 13.5 % (ref 11.5–15.5)
WBC: 8.2 K/uL (ref 4.0–10.5)
nRBC: 0 % (ref 0.0–0.2)

## 2024-09-16 LAB — BASIC METABOLIC PANEL WITH GFR
Anion gap: 8 (ref 5–15)
BUN: 15 mg/dL (ref 8–23)
CO2: 29 mmol/L (ref 22–32)
Calcium: 8.7 mg/dL — ABNORMAL LOW (ref 8.9–10.3)
Chloride: 100 mmol/L (ref 98–111)
Creatinine, Ser: 0.86 mg/dL (ref 0.61–1.24)
GFR, Estimated: >60 mL/min (ref >60)
Glucose, Bld: 176 mg/dL — ABNORMAL HIGH (ref 70–99)
Potassium: 4.1 mmol/L (ref 3.5–5.1)
Sodium: 137 mmol/L (ref 135–145)

## 2024-09-16 SURGERY — CARDIOVERSION
Anesthesia: General

## 2024-09-16 MED ORDER — AMIODARONE HCL 200 MG PO TABS
200.0000 mg | ORAL_TABLET | Freq: Two times a day (BID) | ORAL | Status: DC
Start: 2024-09-23 — End: 2024-09-17
  Filled 2024-09-16: qty 1

## 2024-09-16 MED ORDER — TAMSULOSIN HCL 0.4 MG PO CAPS
0.4000 mg | ORAL_CAPSULE | Freq: Every day | ORAL | Status: DC
  Administered 2024-09-17: 0.4 mg via ORAL
  Filled 2024-09-16: qty 1

## 2024-09-16 MED ORDER — BRIMONIDINE TARTRATE 0.2 % OP SOLN
1.0000 [drp] | Freq: Two times a day (BID) | OPHTHALMIC | Status: DC
  Administered 2024-09-16 – 2024-09-17 (×2): 1 [drp] via OPHTHALMIC
  Filled 2024-09-16: qty 5

## 2024-09-16 MED ORDER — AMIODARONE HCL IN DEXTROSE 360-4.14 MG/200ML-% IV SOLN
INTRAVENOUS | Status: AC
  Filled 2024-09-16: qty 200

## 2024-09-16 MED ORDER — BRINZOLAMIDE 1 % OP SUSP
1.0000 [drp] | Freq: Two times a day (BID) | OPHTHALMIC | Status: DC
  Administered 2024-09-16 – 2024-09-17 (×2): 1 [drp] via OPHTHALMIC
  Filled 2024-09-16: qty 10

## 2024-09-16 MED ORDER — AMIODARONE HCL 200 MG PO TABS
400.0000 mg | ORAL_TABLET | Freq: Two times a day (BID) | ORAL | Status: DC
  Administered 2024-09-16 – 2024-09-17 (×2): 400 mg via ORAL
  Filled 2024-09-16 (×3): qty 2

## 2024-09-16 MED ORDER — PROPOFOL 10 MG/ML IV BOLUS
INTRAVENOUS | Status: DC | PRN
  Administered 2024-09-16: 60 mg via INTRAVENOUS
  Administered 2024-09-16: 40 mg via INTRAVENOUS

## 2024-09-16 MED ORDER — AMIODARONE HCL 200 MG PO TABS
400.0000 mg | ORAL_TABLET | Freq: Two times a day (BID) | ORAL | Status: DC
  Filled 2024-09-16: qty 2

## 2024-09-16 MED ORDER — AMIODARONE HCL 200 MG PO TABS
200.0000 mg | ORAL_TABLET | Freq: Every day | ORAL | Status: DC
  Filled 2024-09-16: qty 1

## 2024-09-16 MED ORDER — AMIODARONE HCL IN DEXTROSE 360-4.14 MG/200ML-% IV SOLN
30.0000 mg/h | INTRAVENOUS | Status: AC
  Administered 2024-09-16: 30 mg/h via INTRAVENOUS
  Filled 2024-09-16 (×2): qty 200

## 2024-09-16 MED ORDER — IPRATROPIUM-ALBUTEROL 0.5-2.5 (3) MG/3ML IN SOLN
RESPIRATORY_TRACT | Status: AC
  Filled 2024-09-16: qty 3

## 2024-09-16 MED ORDER — HYALURONIDASE HUMAN 150 UNIT/ML IJ SOLN
150.0000 [IU] | Freq: Once | INTRAMUSCULAR | Status: AC
  Administered 2024-09-16: 150 [IU] via SUBCUTANEOUS
  Filled 2024-09-16: qty 1

## 2024-09-16 MED ORDER — AMIODARONE HCL 200 MG PO TABS
200.0000 mg | ORAL_TABLET | Freq: Two times a day (BID) | ORAL | Status: DC
  Filled 2024-09-16: qty 1

## 2024-09-16 MED ORDER — AMIODARONE HCL 200 MG PO TABS
200.0000 mg | ORAL_TABLET | Freq: Every day | ORAL | Status: DC
Start: 2024-10-01 — End: 2024-09-17
  Filled 2024-09-16: qty 1

## 2024-09-16 MED ORDER — SODIUM CHLORIDE 0.9 % IV SOLN: INTRAVENOUS | Status: DC

## 2024-09-16 MED ORDER — PROPOFOL 10 MG/ML IV BOLUS
INTRAVENOUS | Status: AC
  Filled 2024-09-16: qty 20

## 2024-09-16 NOTE — Progress Notes (Addendum)
 PROGRESS NOTE    Bryan Kelley   FMW:[id number] DOB: May 31, 1956  DOA: 09/15/2024 Date of Service: 09/16/24 which is Kelley day 0  PCP: Bryan Kelley, Bryan Kelley course / significant events:   [name] is a 68 year old male with history of hyperlipidemia, hypertension, atrial flutter, and atrial fibrillation on anticoagulation, GERD, BPH, erectile dysfunction, COPD on chronic 3 L nasal cannula, who presents to the ED for chief concerns of increased heart rate and shortness of breath at [facility] where he was for a regular checkup and sent to the ED.  Admitted to hosptialist 11/13 w/ cardiology consult for Afib/flutter. Cardioverted 11/14 and on amio IV --> po later in the day.    Consultants:  Cardiology   Procedures/Surgeries: 09/16/24 TEE cardioversion       ASSESSMENT & PLAN:   Persistent atrial fibrillation/flutter: successful TEE-guided cardioversion this morning with maintenance of sinus rhythm. Will likely require rhythm control strategy going forward.   transition from IV to oral amiodarone this afternoon.  Given his relatively young age and chronic lung disease, he may not be a good candidate for long-term amiodarone therapy.  Follow w/ EP outpatient  continue apixaban  5 mg twice daily  Per cardiology, stable for discharge home later today. However, he is reluctant to go home on his own. Cardio ok to monitor him overnight while transition from IV to oral amiodarone. If he is maintaining sinus rhythm tomorrow, he can be discharged from a cardiac standpoint.    Coronary artery disease and noncardiac chest pain: cardiac catheterization 2 months ago, which showed mild-moderate, nonobstructive disease other than a relatively small diagonal that had significant ostial stenosis (not amenable to intervention.   apixaban   high intensity statin  NO aspirin     COPD (chronic obstructive pulmonary disease)  Not in acute  exacerbation Continue oxygen  3 L as needed Home long-acting inhaler equivalent resumed   Essential hypertension Home diltiazem  120 mg p.o. daily resumed Tolazine 5 mg IV every 6 hours as needed for SBP > 160, 5 days ordered   GERD (gastroesophageal reflux disease) Home PPI resumed   Hyperlipidemia Home atorvastatin  80 mg nightly resume  Generalized weakness PT/OT eval   overweight based on BMI: Body mass index is 27.43 kg/m.[name] Significantly low or high BMI is associated with higher medical risk.  Underweight - under 18  overweight - 25 to 29 obese - 30 or more Class 1 obesity: BMI of 30.0 to 34 Class 2 obesity: BMI of 35.0 to 39 Class 3 obesity: BMI of 40.0 to 49 Super Morbid Obesity: BMI 50-59 Super-super Morbid Obesity: BMI 60+ Healthy nutrition and physical activity advised as adjunct to other disease management and risk reduction treatments    DVT prophylaxis: Eliquis  IV fluids: no continuous IV fluids  Nutrition: cardiac Central lines / other devices: none  Code Status: FULL CODE ACP documentation reviewed:  none on file in [facility]  TOC needs: TBD Medical barriers to dispo: cardiac monitoring post cardioversion. Expected medical readiness for discharge tomorrow.              Subjective / Brief ROS:  Patient reports tired today, generally weak No chest pain or palpitations  Denies CP/SOB.  Pain controlled.  Denies new weakness.  Tolerating diet.  Reports no concerns w/ urination/defecation.   Family Communication: noen at this tiem    Objective Findings:  Vitals:   09/16/24 1300 09/16/24 1400 09/16/24 1601 09/16/24 1642  BP: 126/77 123/73 100/79 134/78  Pulse: 72 71 80 80  Resp: 19 16 (!) 26 19  Temp:   (!) 97.5 F (36.4 C) (!) 97.4 F (36.3 C)  TempSrc:   Oral Oral  SpO2: 100% 100% 99% 100%  Weight:      Height:        Intake/Output Summary (Last 24 hours) at 09/16/2024 1728 Last data filed at 09/16/2024 1658 Gross per 24 hour   Intake 1325.36 ml  Output 450 ml  Net 875.36 ml   Filed Weights   09/15/24 1250 09/16/24 0720  Weight: 77.1 kg 77.1 kg    Examination:  Physical Exam Cardiovascular:     Rate and Rhythm: Normal rate and regular rhythm.  Pulmonary:     Effort: Pulmonary effort is normal.     Breath sounds: Normal breath sounds.  Musculoskeletal:     Right lower leg: No edema.     Left lower leg: No edema.  Skin:    General: Skin is warm and dry.  Neurological:     Mental Status: He is alert.          Scheduled Medications:   amiodarone  400 mg Oral BID   Followed by   Bryan Kelley ON 09/23/2024] amiodarone  200 mg Oral BID   Followed by   Bryan Kelley ON 10/01/2024] amiodarone  200 mg Oral Daily   apixaban   5 mg Oral BID   atorvastatin   80 mg Oral Daily   diltiazem   120 mg Oral Daily   fluticasone  furoate-vilanterol  1 puff Inhalation Daily   montelukast   10 mg Oral QHS   pantoprazole   20 mg Oral Daily    Continuous Infusions:  amiodarone 30 mg/hr (09/16/24 1612)    PRN Medications:  acetaminophen  **OR** acetaminophen , hydrALAZINE , ipratropium-albuterol , ondansetron  **OR** ondansetron  (ZOFRAN ) IV, polyethylene glycol, senna-docusate  Antimicrobials from admission:  Anti-infectives (From admission, onward)    None           Data Reviewed:  I have personally reviewed the following...  CBC: Recent Labs  Lab 09/15/24 1251 09/16/24 0521  WBC 7.5 8.2  HGB 12.0* 10.5*  HCT 37.4* 32.2*  MCV 88.4 88.5  PLT 321 294   Basic Metabolic Panel: Recent Labs  Lab 09/15/24 1251 09/16/24 0521  NA 137 137  K 3.9 4.1  CL 101 100  CO2 26 29  GLUCOSE 119* 176*  BUN 12 15  CREATININE 0.84 0.86  CALCIUM  9.4 8.7*   GFR: Estimated Creatinine Clearance: 81.5 mL/min (by C-G formula based on SCr of 0.86 mg/dL). Liver Function Tests: No results for input(s): AST, ALT, ALKPHOS, BILITOT, PROT, ALBUMIN in the last 168 hours. No results for input(s): LIPASE, AMYLASE  in the last 168 hours. No results for input(s): AMMONIA in the last 168 hours. Coagulation Profile: Recent Labs  Lab 09/15/24 1650  INR 1.4*   Cardiac Enzymes: No results for input(s): CKTOTAL, CKMB, CKMBINDEX, TROPONINI in the last 168 hours. BNP (last 3 results) No results for input(s): PROBNP in the last 8760 hours. HbA1C: No results for input(s): HGBA1C in the last 72 hours. CBG: No results for input(s): GLUCAP in the last 168 hours. Lipid Profile: No results for input(s): CHOL, HDL, LDLCALC, TRIG, CHOLHDL, LDLDIRECT in the last 72 hours. Thyroid Function Tests: No results for input(s): TSH, T4TOTAL, FREET4, T3FREE, THYROIDAB in the last 72 hours. Anemia Panel: No results for input(s): VITAMINB12, FOLATE, FERRITIN, TIBC, IRON, RETICCTPCT in the last 72 hours. Most Recent Urinalysis On File:  No results found for: COLORURINE,  APPEARANCEUR, LABSPEC, PHURINE, GLUCOSEU, HGBUR, BILIRUBINUR, KETONESUR, PROTEINUR, UROBILINOGEN, NITRITE, LEUKOCYTESUR Sepsis Labs: @LABRCNTIP (procalcitonin:4,lacticidven:4) Microbiology: Recent Results (from the past 240 hours)  Resp panel by RT-PCR (RSV, Flu A&B, Covid) Anterior Nasal Swab     Status: None   Collection Time: 09/15/24  1:46 PM   Specimen: Anterior Nasal Swab  Result Value Ref Range Status   SARS Coronavirus 2 by RT PCR NEGATIVE NEGATIVE Final    Comment: (NOTE) SARS-CoV-2 target nucleic acids are NOT DETECTED.  The SARS-CoV-2 RNA is generally detectable in upper respiratory specimens during the acute phase of infection. The lowest concentration of SARS-CoV-2 viral copies this assay can detect is 138 copies/mL. A negative result does not preclude SARS-Cov-2 infection and should not be used as the sole basis for treatment or other patient management decisions. A negative result may occur with  improper specimen collection/handling, submission of specimen  other than nasopharyngeal swab, presence of viral mutation(s) within the areas targeted by this assay, and inadequate number of viral copies(<138 copies/mL). A negative result must be combined with clinical observations, patient history, and epidemiological information. The expected result is Negative.  Fact Sheet for Patients:  bloggercourse.com  Fact Sheet for Healthcare Providers:  seriousbroker.it  This test is no t yet approved or cleared by the [place]  FDA and  has been authorized for detection and/or diagnosis of SARS-CoV-2 by FDA under an Emergency Use Authorization (EUA). This EUA will remain  in effect (meaning this test can be used) for the duration of the COVID-19 declaration under Section 564(b)(1) of the Act, 21 U.S.C.section 360bbb-3(b)(1), unless the authorization is terminated  or revoked sooner.       Influenza A by PCR NEGATIVE NEGATIVE Final   Influenza B by PCR NEGATIVE NEGATIVE Final    Comment: (NOTE) The Xpert Xpress SARS-CoV-2/FLU/RSV plus assay is intended as an aid in the diagnosis of influenza from Nasopharyngeal swab specimens and should not be used as a sole basis for treatment. Nasal washings and aspirates are unacceptable for Xpert Xpress SARS-CoV-2/FLU/RSV testing.  Fact Sheet for Patients: bloggercourse.com  Fact Sheet for Healthcare Providers: seriousbroker.it  This test is not yet approved or cleared by the [place]  FDA and has been authorized for detection and/or diagnosis of SARS-CoV-2 by FDA under an Emergency Use Authorization (EUA). This EUA will remain in effect (meaning this test can be used) for the duration of the COVID-19 declaration under Section 564(b)(1) of the Act, 21 U.S.C. section 360bbb-3(b)(1), unless the authorization is terminated or revoked.     Resp Syncytial Virus by PCR NEGATIVE NEGATIVE Final     Comment: (NOTE) Fact Sheet for Patients: bloggercourse.com  Fact Sheet for Healthcare Providers: seriousbroker.it  This test is not yet approved or cleared by the [place]  FDA and has been authorized for detection and/or diagnosis of SARS-CoV-2 by FDA under an Emergency Use Authorization (EUA). This EUA will remain in effect (meaning this test can be used) for the duration of the COVID-19 declaration under Section 564(b)(1) of the Act, 21 U.S.C. section 360bbb-3(b)(1), unless the authorization is terminated or revoked.  Performed at [facility], [address] [place], [place] [address]       Radiology Studies last 3 days: DG Chest 2 View Result Date: 09/15/2024 EXAM: 2 VIEW(S) XRAY OF THE CHEST 09/15/2024 01:23:46 PM COMPARISON: 08/28/2024 CLINICAL HISTORY: shortness of breath FINDINGS: LINES, TUBES AND DEVICES: Multiple wires and leads project over the chest on the frontal radiograph. LUNGS AND PLEURA: Similar left base airspace  disease. Tiny left pleural effusion. No pneumothorax. HEART AND MEDIASTINUM: Transverse aortic atherosclerosis. BONES AND SOFT TISSUES: No acute osseous abnormality. IMPRESSION: 1. Tiny left pleural effusion with similar adjacent left lower lobe airspace disease. This could represent atelectasis or early infection. Electronically signed by: Rockey Kilts MD 09/15/2024 02:02 PM EST RP Workstation: [id number]       Laneta Blunt, DO [facility] 09/16/2024, 5:28 PM    Dictation software may have been used to generate the above note. Typos may occur and escape review in typed/dictated notes. Please contact Dr Blunt directly for clarity if needed.  Staff may message me via secure chat in Epic  but this may not receive an immediate response,  please page me for urgent matters!  If 7PM-7AM, please contact night coverage [email or web address]

## 2024-09-16 NOTE — H&P (Signed)
 History and Physical Interval Note:  09/16/2024 7:00 AM  Bryan Kelley  has presented today for DCCV, with the diagnosis of atrial flutter.  The various methods of treatment have been discussed with the patient and family. After consideration of risks, benefits and other options for treatment, the patient has consented to  Procedure(s): CARDIOVERSION (N/A) as a therapeutic intervention.  The patient's history has been reviewed, patient examined, no change in status, stable for surgery.  I have reviewed the patient's chart and labs.  Questions were answered to the patient's satisfaction.     Caron [facility]

## 2024-09-16 NOTE — CV Procedure (Signed)
   DIRECT CURRENT CARDIOVERSION  NAME:  MARKUS CASTEN    MRN: [id number] DOB:  [birth year]    ADMIT DATE: 09/15/2024  INDICATIONS: Symptomatic atrial flutter  PROCEDURE:   Informed consent was obtained prior to the procedure. The risks, benefits and alternatives for the procedure were discussed and the patient comprehended these risks.  Risks include, but are not limited to, cough, sore throat, vomiting, nausea, somnolence, bleeding, low blood pressure, aspiration, pneumonia, infection, and death  After a procedural time-out, the patient was sedated by the anesthesia service. Once an appropriate level of sedation was confirmed, the patient was cardioverted x 2 with 200J of biphasic synchronized energy.  Following the first discharge, he went from atrial flutter into atrial fibrillation. After a second discharge, the patient converted to NSR.  There were no apparent complications.  The patient had normal neuro status and respiratory status post procedure with vitals stable as recorded elsewhere.  Adequate airway was maintained throughout and vital signs monitored per protocol.  COMPLICATIONS:    Complications: No complications Patient tolerated procedure well.  Signed, Caron Poser, MD

## 2024-09-16 NOTE — Transfer of Care (Signed)
 Immediate Anesthesia Transfer of Care Note  Patient: Bryan Kelley  Procedure(s) Performed: CARDIOVERSION  Patient Location: [facility]  Anesthesia Type:General  Level of Consciousness: drowsy  Airway & Oxygen  Therapy: Patient Spontanous Breathing and Patient connected to nasal cannula oxygen   Post-op Assessment: Report given to RN and Post -op Vital signs reviewed and stable  Post vital signs: Reviewed and stable  Last Vitals:  Vitals Value Taken Time  BP 103/77 09/16/24 07:48  Temp    Pulse 66 09/16/24 07:51  Resp 20 09/16/24 07:51  SpO2 99 % 09/16/24 07:51    Last Pain:  Vitals:   09/16/24 0720  TempSrc: Oral  PainSc: 0-No pain         Complications: No notable events documented.

## 2024-09-16 NOTE — Progress Notes (Signed)
 Patient complained of SHOB and requested a nebulizer. Gave PRN duoneb, which he states he takes every six hours at home.

## 2024-09-16 NOTE — Progress Notes (Signed)
 Progress Note  Patient Name: Bryan Kelley Date of Encounter: 09/16/2024  Primary Cardiologist: Anner  Subjective   Status post assess with DCCV this morning.  Maintaining sinus rhythm.  Dyspnea improving.  No frank chest pain.  Feels generalized fatigue.  Inpatient Medications    Scheduled Meds:  amiodarone  400 mg Oral BID   Followed by   NOREEN ON 09/23/2024] amiodarone  200 mg Oral BID   Followed by   NOREEN ON 10/01/2024] amiodarone  200 mg Oral Daily   [MAR Hold] apixaban   5 mg Oral BID   [MAR Hold] atorvastatin   80 mg Oral Daily   [MAR Hold] diltiazem   120 mg Oral Daily   [MAR Hold] fluticasone  furoate-vilanterol  1 puff Inhalation Daily   [MAR Hold] montelukast   10 mg Oral QHS   [MAR Hold] pantoprazole   20 mg Oral Daily   Continuous Infusions:  sodium chloride      amiodarone     PRN Meds: [MAR Hold] acetaminophen  **OR** [MAR Hold] acetaminophen , [MAR Hold] hydrALAZINE , [MAR Hold] ipratropium-albuterol , [MAR Hold] ondansetron  **OR** [MAR Hold] ondansetron  (ZOFRAN ) IV, [MAR Hold] polyethylene glycol, [MAR Hold] senna-docusate   Vital Signs    Vitals:   09/16/24 1100 09/16/24 1200 09/16/24 1300 09/16/24 1400  BP: 123/77 118/70 126/77 123/73  Pulse:  73 72 71  Resp: 15 18 19 16   Temp:      TempSrc:      SpO2:  100% 100% 100%  Weight:      Height:        Intake/Output Summary (Last 24 hours) at 09/16/2024 1448 Last data filed at 09/16/2024 1200 Gross per 24 hour  Intake 1194.42 ml  Output 250 ml  Net 944.42 ml   Filed Weights   09/15/24 1250 09/16/24 0720  Weight: 77.1 kg 77.1 kg    Telemetry    Sinus rhythm - Personally Reviewed  ECG    Sinus rhythm with no acute ST-T changes - Personally Reviewed  Physical Exam   GEN: No acute distress.   Neck: No JVD. Cardiac: RRR, no murmurs, rubs, or gallops.  Respiratory: Mild wheezing bilaterally.  GI: Soft, nontender, non-distended.   MS: No edema; No deformity. Neuro:  Alert and oriented x  3; Nonfocal.  Psych: Normal affect.  Labs    Chemistry Recent Labs  Lab 09/15/24 1251 09/16/24 0521  NA 137 137  K 3.9 4.1  CL 101 100  CO2 26 29  GLUCOSE 119* 176*  BUN 12 15  CREATININE 0.84 0.86  CALCIUM  9.4 8.7*  GFRNONAA >60 >60  ANIONGAP 10 8     Hematology Recent Labs  Lab 09/15/24 1251 09/16/24 0521  WBC 7.5 8.2  RBC 4.23 3.64*  HGB 12.0* 10.5*  HCT 37.4* 32.2*  MCV 88.4 88.5  MCH 28.4 28.8  MCHC 32.1 32.6  RDW 13.6 13.5  PLT 321 294    Cardiac EnzymesNo results for input(s): TROPONINI in the last 168 hours. No results for input(s): TROPIPOC in the last 168 hours.   BNPNo results for input(s): BNP, PROBNP in the last 168 hours.   DDimer No results for input(s): DDIMER in the last 168 hours.   Radiology    DG Chest 2 View Result Date: 09/15/2024 IMPRESSION: 1. Tiny left pleural effusion with similar adjacent left lower lobe airspace disease. This could represent atelectasis or early infection. Electronically signed by: Rockey Kilts MD 09/15/2024 02:02 PM EST RP Workstation: [id number]    Cardiac Studies     Patient  Profile     68 y.o. male with history of nonobstructive CAD by LHC in 07/2024, chronic hypoxic respiratory failure on 3 L supplemental oxygen  secondary to COPD, A-fib/flutter status post prior TEE guided DCCV in 08/2024, HTN, HLD, normocytic anemia, and GERD who we are seeing for atrial flutter with RVR.  Assessment & Plan    1.  A-fib/flutter with RVR: - That is post SSL DCCV earlier this morning, maintaining sinus rhythm - Continue IV amiodarone through this evening with recommendation to transition to oral amiodarone tonight per usual 10 g load -Continue Cardizem  120 mg -Continue apixaban  5 mg twice daily, does not meet reduced dosing criteria - Recommend outpatient evaluation by EP for ongoing management of A-fib/flutter requiring multiple cardioversion - Recent TSH normal - Potassium at goal  2.  Nonobstructive CAD  with elevated troponin/HLD: - Recent LHC with nonobstructive CAD - Initial troponin minimally elevated at 20 with delta troponin 18 - Not consistent with ACS - No symptoms of chest pain - On apixaban  in lieu of aspirin - LDL 78 in 07/2024 - PTA atorvastatin  80 mg - No plans for inpatient ischemic evaluation       For questions or updates, please contact [facility] Please consult [email or web address] for contact info under Cardiology/STEMI.    Signed, Bernardino Bring, PA-C [facility] Pager: [phone] 09/16/2024, 2:48 PM

## 2024-09-16 NOTE — Anesthesia Preprocedure Evaluation (Signed)
 Anesthesia Evaluation  Patient identified by MRN, date of birth, ID band Patient awake    Reviewed: Allergy & Precautions, H&P , NPO status , Patient's Chart, lab work & pertinent test results, reviewed documented beta blocker date and time   Airway Mallampati: II   Neck ROM: full    Dental  (+) Poor Dentition   Pulmonary neg shortness of breath, asthma , neg sleep apnea, COPD,  COPD inhaler and oxygen  dependent, neg recent URI, Patient did not abstain from smoking., former smoker   Pulmonary exam normal        Cardiovascular Exercise Tolerance: Poor hypertension, + CAD  Atrial Fibrillation  Rhythm:regular Rate:Normal     Neuro/Psych  PSYCHIATRIC DISORDERS  Depression    negative neurological ROS     GI/Hepatic Neg liver ROS,GERD  Medicated,,  Endo/Other   Hyperthyroidism   Renal/GU negative Renal ROS  negative genitourinary   Musculoskeletal   Abdominal   Peds  Hematology negative hematology ROS (+)   Anesthesia Other Findings Past Medical History: 05/07/2009: Acute bronchitis     Comment:  Qualifier: Diagnosis of   By: Antonetta MD, Margaret      10/06/2020: Acute on chronic hypoxic respiratory failure (HCC) 12/06/2019: Acute on chronic respiratory failure with hypercapnia  (HCC) 07/28/2024: Acute on chronic respiratory failure with hypoxia and  hypercapnia (HCC) 12/06/2019: Acute respiratory disease due to COVID-19 virus No date: Asthma No date: COPD (chronic obstructive pulmonary disease) (HCC) 10/29/2019: COPD with acute exacerbation (HCC) No date: Depression No date: GERD (gastroesophageal reflux disease) 07/28/2024: Inferior ST segment elevation 07/30/2024: Sinus tachycardia No date: Vision loss, left eye Past Surgical History: 08/16/2024: CARDIOVERSION; N/A     Comment:  Procedure: CARDIOVERSION;  Surgeon: Darliss Rogue,               MD;  Location: [facility] ORS;  Service: Cardiovascular;                 Laterality: N/A; 2009, about 8011,8010: EYE SURGERY     Comment:  steel in left eye on the job, legally blind  07/28/2024: LEFT HEART CATH AND CORONARY ANGIOGRAPHY; N/A     Comment:  Procedure: LEFT HEART CATH AND CORONARY ANGIOGRAPHY;                Surgeon: Anner Alm ORN, MD;  Location: [facility] INVASIVE               CV LAB;  Service: Cardiovascular;  Laterality: N/A; 08/16/2024: TEE WITHOUT CARDIOVERSION; N/A     Comment:  Procedure: ECHOCARDIOGRAM, TRANSESOPHAGEAL;  Surgeon:               Darliss Rogue, MD;  Location: [facility] ORS;  Service:               Cardiovascular;  Laterality: N/A; BMI    Body Mass Index: 27.44 kg/m     Reproductive/Obstetrics negative OB ROS                              Anesthesia Physical Anesthesia Plan  ASA: 4  Anesthesia Plan: General   Post-op Pain Management:    Induction:   PONV Risk Score and Plan: 2  Airway Management Planned:   Additional Equipment:   Intra-op Plan:   Post-operative Plan:   Informed Consent: I have reviewed the patients History and Physical, chart, labs and discussed the procedure including the risks, benefits and alternatives for the proposed anesthesia with  the patient or authorized representative who has indicated his/her understanding and acceptance.     Dental Advisory Given  Plan Discussed with: CRNA  Anesthesia Plan Comments:         Anesthesia Quick Evaluation

## 2024-09-17 ENCOUNTER — Other Ambulatory Visit: Payer: Self-pay

## 2024-09-17 DIAGNOSIS — I483 Typical atrial flutter: Secondary | ICD-10-CM | POA: Diagnosis not present

## 2024-09-17 MED ORDER — AMIODARONE HCL 200 MG PO TABS
ORAL_TABLET | ORAL | 0 refills | Status: DC
  Filled 2024-09-17: qty 45, 20d supply, fill #0

## 2024-09-17 MED ORDER — DILTIAZEM HCL ER COATED BEADS 120 MG PO CP24
120.0000 mg | ORAL_CAPSULE | Freq: Every day | ORAL | 0 refills | Status: DC
  Filled 2024-09-17: qty 30, 30d supply, fill #0

## 2024-09-17 NOTE — Plan of Care (Signed)
  Problem: Education: Goal: Knowledge of General Education information will improve Description: Including pain rating scale, medication(s)/side effects and non-pharmacologic comfort measures Outcome: Progressing   Problem: Health Behavior/Discharge Planning: Goal: Ability to manage health-related needs will improve Outcome: Progressing   Problem: Clinical Measurements: Goal: Diagnostic test results will improve Outcome: Progressing Goal: Respiratory complications will improve Outcome: Progressing Goal: Cardiovascular complication will be avoided Outcome: Progressing   Problem: Activity: Goal: Risk for activity intolerance will decrease Outcome: Progressing   Problem: Coping: Goal: Level of anxiety will decrease Outcome: Progressing   Problem: Safety: Goal: Ability to remain free from injury will improve Outcome: Progressing   Problem: Education: Goal: Knowledge of disease or condition will improve Outcome: Progressing

## 2024-09-17 NOTE — Progress Notes (Signed)
 Telemetry reviewed. He remains in sinus rhythm with ventricular rates in the 70s to 90s bpm. No changes from plan outlined in progress note on 11/14. He should follow up with EP in 2 weeks (message has already been sent to the office for this to be scheduled).

## 2024-09-17 NOTE — Discharge Summary (Signed)
 Physician Discharge Summary   Patient: Bryan Kelley MRN: [id number]  DOB: 04-24-1956   Admit:     Date of Admission: 09/15/2024 Admitted from: home   Discharge: Date of discharge: 09/17/24 Disposition: Home Condition at discharge: good  CODE STATUS: FULL CODE     Discharge Physician: Laneta Blunt, DO [facility]     PCP: [facility]  Recommendations for Outpatient Follow-up:  Follow up with PCP [facility] in 1-2 weeks FOllow as directed w/ cardiology - electrophysiology for follow up cardioversion    Discharge Instructions     Amb referral to [facility]   Complete by: As directed    Diet - low sodium heart healthy   Complete by: As directed    Increase activity slowly   Complete by: As directed          Discharge Diagnoses: Principal Problem:   Atrial flutter (HCC) Active Problems:   COPD (chronic obstructive pulmonary disease) (HCC)   CAD , nonobstructive on cath 07/28/2024   GERD (gastroesophageal reflux disease)   BPH (benign prostatic hyperplasia)   Essential hypertension   Hyperlipidemia   SOB (shortness of breath)       Hospital course / significant events:   Bryan Kelley is a 68 year old male with history of hyperlipidemia, hypertension, atrial flutter, and atrial fibrillation on anticoagulation, GERD, BPH, erectile dysfunction, COPD on chronic 3 L nasal cannula, who presents to the ED for chief concerns of increased heart rate and shortness of breath at [facility] where he was for a regular checkup and sent to the ED.  Admitted to hosptialist 11/13 w/ cardiology consult for Afib/flutter. Cardioverted 11/14 and on amio IV --> po later in the day.    Consultants:  Cardiology   Procedures/Surgeries: 09/16/24 TEE cardioversion       ASSESSMENT & PLAN:   Persistent atrial fibrillation/flutter: likely require rhythm control strategy going forward.   oral amiodarone  as directed  Given his relatively young age and chronic lung disease, he may not be a good candidate for long-term amiodarone therapy.  Follow w/ EP outpatient  continue apixaban  5 mg twice daily  Continue cardizem  120 mg    Coronary artery disease and noncardiac chest pain: cardiac catheterization 2 months ago, which showed mild-moderate, nonobstructive disease other than a relatively small diagonal that had significant ostial stenosis (not amenable to intervention.   apixaban   high intensity statin  NO aspirin     Infiltrated IV R arm Elevation  Heat  Avoid NSAID in cardiac pt  Acetaminophen  ok for pain   COPD (chronic obstructive pulmonary disease)  Chronic hypoxic respiratory failure on home O2 3L [place]  Not in acute exacerbation Continue home oxygen  3 L as needed Home inhaler resumed   Essential hypertension Home diltiazem  120 mg p.o. daily    GERD (gastroesophageal reflux disease) Home PPI    Hyperlipidemia Home atorvastatin  80 mg nightly   Generalized weakness PT/OT eval   overweight based on BMI: Body mass index is 27.43 kg/m.[name] Significantly low or high BMI is associated with higher medical risk.  Underweight - under 18  overweight - 25 to 29 obese - 30 or more Class 1 obesity: BMI of 30.0 to 34 Class 2 obesity: BMI of 35.0 to 39 Class 3 obesity: BMI of 40.0 to 49 Super Morbid Obesity: BMI 50-59 Super-super Morbid Obesity: BMI 60+ Healthy nutrition and physical activity advised as adjunct to other disease management and risk  reduction treatments              Discharge Instructions  Allergies as of 09/17/2024       Reactions   Aspirin         Medication List     STOP taking these medications    theophylline  400 MG 24 hr tablet Commonly known as: UNIPHYL       TAKE these medications    acetaminophen  325 MG tablet Commonly known as: TYLENOL  Take 2 tablets (650 mg total) by mouth every 6 (six) hours as needed for mild pain, fever  or headache.   albuterol  108 (90 Base) MCG/ACT inhaler Commonly known as: VENTOLIN  HFA Inhale 2 puffs into the lungs every 6 (six) hours as needed for wheezing or shortness of breath.   amiodarone 200 MG tablet Commonly known as: PACERONE Take 2 tablets (400 mg total) by mouth 2 (two) times daily for 6 days, THEN 1 tablet (200 mg total) 2 (two) times daily for 7 days, THEN 1 tablet (200 mg total) daily for 7 days. Start taking on: September 17, 2024   apixaban  5 MG Tabs tablet Commonly known as: ELIQUIS  Take 1 tablet (5 mg total) by mouth 2 (two) times daily.   atorvastatin  80 MG tablet Commonly known as: LIPITOR  Take 1 tablet (80 mg total) by mouth daily.   brimonidine  0.2 % ophthalmic solution Commonly known as: ALPHAGAN  Place 1 drop into the left eye 2 (two) times daily.   brinzolamide  1 % ophthalmic suspension Commonly known as: AZOPT  Place 1 drop into the left eye 2 (two) times daily.   budesonide -formoterol  80-4.5 MCG/ACT inhaler Commonly known as: Symbicort  Inhale 2 puffs into the lungs 2 (two) times daily.   diltiazem  120 MG 24 hr capsule Commonly known as: CARDIZEM  CD Take 1 capsule (120 mg total) by mouth daily.   ipratropium-albuterol  0.5-2.5 (3) MG/3ML Soln Commonly known as: DUONEB Take 3 mLs by nebulization 4 (four) times daily as needed.   lidocaine  5 % Commonly known as: Lidoderm  Place 1 patch onto the skin every 12 (twelve) hours. Remove & Discard patch within 12 hours or as directed by MD.  Ginnie the patch off for 12 hours before applying a new one.   montelukast  10 MG tablet Commonly known as: SINGULAIR  Take 1 tablet (10 mg total) by mouth at bedtime.   pantoprazole  20 MG tablet Commonly known as: PROTONIX  Take 1 tablet (20 mg total) by mouth daily.   polyethylene glycol 17 g packet Commonly known as: MIRALAX  / GLYCOLAX  Take 17 g by mouth 2 (two) times daily.   sildenafil 100 MG tablet Commonly known as: VIAGRA Take 100 mg by mouth as needed  for erectile dysfunction.   tamsulosin  0.4 MG Caps capsule Commonly known as: FLOMAX  Take 1 capsule (0.4 mg total) by mouth daily.          Allergies  Allergen Reactions   Aspirin      Subjective: pt denies chest pain other than some pulling in in L lower ribs w/ movement. Reports R arm swelling due to IV infiltration. Otherwise no chest pain    Discharge Exam: BP 130/70 (BP Location: Right Arm)   Pulse 81   Temp 98.4 F (36.9 C) (Oral)   Resp 14   Ht 5' 6 (1.676 m)   Wt 77.1 kg   SpO2 100%   BMI 27.43 kg/m  General: Pt is alert, awake, not in acute distress Cardiovascular: RRR, S1/S2 +, no rubs, no gallops  Respiratory: CTA bilaterally, no wheezing, no rhonchi Abdominal: Soft, NT, ND, bowel sounds + Extremities: (+)RUE non pitting edema proximal to and surrounding IV insertions site (no IV in place now) and skin is tender but no erythema no cyanosis     The results of significant diagnostics from this hospitalization (including imaging, microbiology, ancillary and laboratory) are listed below for reference.     Microbiology: Recent Results (from the past 240 hours)  Resp panel by RT-PCR (RSV, Flu A&B, Covid) Anterior Nasal Swab     Status: None   Collection Time: 09/15/24  1:46 PM   Specimen: Anterior Nasal Swab  Result Value Ref Range Status   SARS Coronavirus 2 by RT PCR NEGATIVE NEGATIVE Final    Comment: (NOTE) SARS-CoV-2 target nucleic acids are NOT DETECTED.  The SARS-CoV-2 RNA is generally detectable in upper respiratory specimens during the acute phase of infection. The lowest concentration of SARS-CoV-2 viral copies this assay can detect is 138 copies/mL. A negative result does not preclude SARS-Cov-2 infection and should not be used as the sole basis for treatment or other patient management decisions. A negative result may occur with  improper specimen collection/handling, submission of specimen other than nasopharyngeal swab, presence of viral  mutation(s) within the areas targeted by this assay, and inadequate number of viral copies(<138 copies/mL). A negative result must be combined with clinical observations, patient history, and epidemiological information. The expected result is Negative.  Fact Sheet for Patients:  bloggercourse.com  Fact Sheet for Healthcare Providers:  seriousbroker.it  This test is no t yet approved or cleared by the [place]  FDA and  has been authorized for detection and/or diagnosis of SARS-CoV-2 by FDA under an Emergency Use Authorization (EUA). This EUA will remain  in effect (meaning this test can be used) for the duration of the COVID-19 declaration under Section 564(b)(1) of the Act, 21 U.S.C.section 360bbb-3(b)(1), unless the authorization is terminated  or revoked sooner.       Influenza A by PCR NEGATIVE NEGATIVE Final   Influenza B by PCR NEGATIVE NEGATIVE Final    Comment: (NOTE) The Xpert Xpress SARS-CoV-2/FLU/RSV plus assay is intended as an aid in the diagnosis of influenza from Nasopharyngeal swab specimens and should not be used as a sole basis for treatment. Nasal washings and aspirates are unacceptable for Xpert Xpress SARS-CoV-2/FLU/RSV testing.  Fact Sheet for Patients: bloggercourse.com  Fact Sheet for Healthcare Providers: seriousbroker.it  This test is not yet approved or cleared by the [place]  FDA and has been authorized for detection and/or diagnosis of SARS-CoV-2 by FDA under an Emergency Use Authorization (EUA). This EUA will remain in effect (meaning this test can be used) for the duration of the COVID-19 declaration under Section 564(b)(1) of the Act, 21 U.S.C. section 360bbb-3(b)(1), unless the authorization is terminated or revoked.     Resp Syncytial Virus by PCR NEGATIVE NEGATIVE Final    Comment: (NOTE) Fact Sheet for  Patients: bloggercourse.com  Fact Sheet for Healthcare Providers: seriousbroker.it  This test is not yet approved or cleared by the [place]  FDA and has been authorized for detection and/or diagnosis of SARS-CoV-2 by FDA under an Emergency Use Authorization (EUA). This EUA will remain in effect (meaning this test can be used) for the duration of the COVID-19 declaration under Section 564(b)(1) of the Act, 21 U.S.C. section 360bbb-3(b)(1), unless the authorization is terminated or revoked.  Performed at [facility], [address], [place], [place] [address]      Labs:  BNP (last 3 results) Recent Labs    11/12/23 2359 08/10/24 1643  BNP 13.9 234.2*   Basic Metabolic Panel: Recent Labs  Lab 09/15/24 1251 09/16/24 0521  NA 137 137  K 3.9 4.1  CL 101 100  CO2 26 29  GLUCOSE 119* 176*  BUN 12 15  CREATININE 0.84 0.86  CALCIUM  9.4 8.7*   Liver Function Tests: No results for input(s): AST, ALT, ALKPHOS, BILITOT, PROT, ALBUMIN in the last 168 hours. No results for input(s): LIPASE, AMYLASE in the last 168 hours. No results for input(s): AMMONIA in the last 168 hours. CBC: Recent Labs  Lab 09/15/24 1251 09/16/24 0521  WBC 7.5 8.2  HGB 12.0* 10.5*  HCT 37.4* 32.2*  MCV 88.4 88.5  PLT 321 294   Cardiac Enzymes: No results for input(s): CKTOTAL, CKMB, CKMBINDEX, TROPONINI in the last 168 hours. BNP: Invalid input(s): POCBNP CBG: No results for input(s): GLUCAP in the last 168 hours. D-Dimer No results for input(s): DDIMER in the last 72 hours. Hgb A1c No results for input(s): HGBA1C in the last 72 hours. Lipid Profile No results for input(s): CHOL, HDL, LDLCALC, TRIG, CHOLHDL, LDLDIRECT in the last 72 hours. Thyroid function studies No results for input(s): TSH, T4TOTAL, T3FREE, THYROIDAB in the last 72 hours.  Invalid input(s):  FREET3 Anemia work up No results for input(s): VITAMINB12, FOLATE, FERRITIN, TIBC, IRON, RETICCTPCT in the last 72 hours. Urinalysis No results found for: COLORURINE, APPEARANCEUR, LABSPEC, PHURINE, GLUCOSEU, HGBUR, BILIRUBINUR, KETONESUR, PROTEINUR, UROBILINOGEN, NITRITE, LEUKOCYTESUR Sepsis Labs Recent Labs  Lab 09/15/24 1251 09/16/24 0521  WBC 7.5 8.2   Microbiology Recent Results (from the past 240 hours)  Resp panel by RT-PCR (RSV, Flu A&B, Covid) Anterior Nasal Swab     Status: None   Collection Time: 09/15/24  1:46 PM   Specimen: Anterior Nasal Swab  Result Value Ref Range Status   SARS Coronavirus 2 by RT PCR NEGATIVE NEGATIVE Final    Comment: (NOTE) SARS-CoV-2 target nucleic acids are NOT DETECTED.  The SARS-CoV-2 RNA is generally detectable in upper respiratory specimens during the acute phase of infection. The lowest concentration of SARS-CoV-2 viral copies this assay can detect is 138 copies/mL. A negative result does not preclude SARS-Cov-2 infection and should not be used as the sole basis for treatment or other patient management decisions. A negative result may occur with  improper specimen collection/handling, submission of specimen other than nasopharyngeal swab, presence of viral mutation(s) within the areas targeted by this assay, and inadequate number of viral copies(<138 copies/mL). A negative result must be combined with clinical observations, patient history, and epidemiological information. The expected result is Negative.  Fact Sheet for Patients:  bloggercourse.com  Fact Sheet for Healthcare Providers:  seriousbroker.it  This test is no t yet approved or cleared by the [place]  FDA and  has been authorized for detection and/or diagnosis of SARS-CoV-2 by FDA under an Emergency Use Authorization (EUA). This EUA will remain  in effect (meaning this test can  be used) for the duration of the COVID-19 declaration under Section 564(b)(1) of the Act, 21 U.S.C.section 360bbb-3(b)(1), unless the authorization is terminated  or revoked sooner.       Influenza A by PCR NEGATIVE NEGATIVE Final   Influenza B by PCR NEGATIVE NEGATIVE Final    Comment: (NOTE) The Xpert Xpress SARS-CoV-2/FLU/RSV plus assay is intended as an aid in the diagnosis of influenza from Nasopharyngeal swab specimens and should not be used as a sole basis for treatment.  Nasal washings and aspirates are unacceptable for Xpert Xpress SARS-CoV-2/FLU/RSV testing.  Fact Sheet for Patients: bloggercourse.com  Fact Sheet for Healthcare Providers: seriousbroker.it  This test is not yet approved or cleared by the [place]  FDA and has been authorized for detection and/or diagnosis of SARS-CoV-2 by FDA under an Emergency Use Authorization (EUA). This EUA will remain in effect (meaning this test can be used) for the duration of the COVID-19 declaration under Section 564(b)(1) of the Act, 21 U.S.C. section 360bbb-3(b)(1), unless the authorization is terminated or revoked.     Resp Syncytial Virus by PCR NEGATIVE NEGATIVE Final    Comment: (NOTE) Fact Sheet for Patients: bloggercourse.com  Fact Sheet for Healthcare Providers: seriousbroker.it  This test is not yet approved or cleared by the [place]  FDA and has been authorized for detection and/or diagnosis of SARS-CoV-2 by FDA under an Emergency Use Authorization (EUA). This EUA will remain in effect (meaning this test can be used) for the duration of the COVID-19 declaration under Section 564(b)(1) of the Act, 21 U.S.C. section 360bbb-3(b)(1), unless the authorization is terminated or revoked.  Performed at [facility], [address] [place], [place] [address]    Imaging DG Chest 2 View Result Date:  09/15/2024 EXAM: 2 VIEW(S) XRAY OF THE CHEST 09/15/2024 01:23:46 PM COMPARISON: 08/28/2024 CLINICAL HISTORY: shortness of breath FINDINGS: LINES, TUBES AND DEVICES: Multiple wires and leads project over the chest on the frontal radiograph. LUNGS AND PLEURA: Similar left base airspace disease. Tiny left pleural effusion. No pneumothorax. HEART AND MEDIASTINUM: Transverse aortic atherosclerosis. BONES AND SOFT TISSUES: No acute osseous abnormality. IMPRESSION: 1. Tiny left pleural effusion with similar adjacent left lower lobe airspace disease. This could represent atelectasis or early infection. Electronically signed by: Rockey Kilts MD 09/15/2024 02:02 PM EST RP Workstation: [id number]      Time coordinating discharge: over 30 minutes  SIGNED:  Winslow Ederer DO [facility]

## 2024-09-17 NOTE — Progress Notes (Signed)
 Infiltration (swollen and painful to touch) noted on IV site on his right AC, Amiodarone infusion stopped, IV line removed. Pharmacy notified. Dr. Madison Cao MD informed and ordered the following: hyaluronidase Human (HYLENEX) injection 150 Units  1.Stop the infusion immediately. 2. Aspirate as much drug as possible from the IV catheter (if still in place). 3. Elevate the limb. 4. Apply cold compress (amiodarone extravasation causes local irritation and phlebitis; cold helps reduce inflammation and pain). 5. Avoid heat (heat may worsen tissue damage). 6. Monitor the site for:  Pain  Swelling  Erythema  Induration

## 2024-09-17 NOTE — Progress Notes (Signed)
 Physical Therapy Evaluation Patient Details Name: Bryan Kelley MRN: [id number] DOB: 05/06/1956 Today's Date: 09/17/2024  History of Present Illness  Pt is a 67 y/o male admitted secondary to SOB, increased HR and general weakness. Pt found to have a-flutter. Pt is s/p cardioversion on 09/16/24. PMH including but not limited to hyperlipidemia, hypertension, atrial flutter, and atrial fibrillation on anticoagulation, GERD, BPH, erectile dysfunction, COPD on chronic 3 L nasal cannula.   Clinical Impression  Pt presented supine in bed with HOB elevated, awake and willing to participate in therapy session. Prior to admission, pt reported that he was independent with all functional mobility and ADLs. Pt lives with his sister in a single level home with three STE (with rail). At the time of evaluation, pt able to complete bed mobility at a mod ind level, transfers with supervision and ambulated within his room for limited distances with supervision for safety without the need of an AD. Pt's HR was stable throughout (HR 90 bpm at rest, increasing to 98 bpm after ambulation). Pt on 3L of supplemental O2 throughout, which is his baseline. Pt would continue to benefit from skilled physical therapy services at this time while admitted and after d/c to address the below listed limitations in order to improve overall safety and independence with functional mobility.         If plan is discharge home, recommend the following: A little help with bathing/dressing/bathroom;Assistance with cooking/housework;Assist for transportation;Help with stairs or ramp for entrance   Can travel by private vehicle        Equipment Recommendations None recommended by PT  Recommendations for Other Services       Functional Status Assessment Patient has had a recent decline in their functional status and demonstrates the ability to make significant improvements in function in a reasonable and predictable amount of time.      Precautions / Restrictions Precautions Precautions: None Recall of Precautions/Restrictions: Intact Precaution/Restrictions Comments: on 3L of O2 at baseline Restrictions Weight Bearing Restrictions Per Provider Order: No      Mobility  Bed Mobility Overal bed mobility: Modified Independent                  Transfers Overall transfer level: Needs assistance Equipment used: None Transfers: Sit to/from Stand Sit to Stand: Supervision           General transfer comment: supervision for safety with transitional movement    Ambulation/Gait Ambulation/Gait assistance: Supervision Gait Distance (Feet): 30 Feet Assistive device: None Gait Pattern/deviations: Step-through pattern, Decreased stride length Gait velocity: decreased     General Gait Details: pt with slow, steady gait within his room and in narrow spaces; only able to tolerate short distance within his room due to increased DOE and WOB  Stairs            Wheelchair Mobility     Tilt Bed    Modified Rankin (Stroke Patients Only)       Balance Overall balance assessment: Needs assistance Sitting-balance support: Feet supported Sitting balance-Leahy Scale: Good     Standing balance support: During functional activity, No upper extremity supported Standing balance-Leahy Scale: Fair                               Pertinent Vitals/Pain Pain Assessment Pain Assessment: No/denies pain    Home Living Family/patient expects to be discharged to:: Private residence Living Arrangements: Other relatives (sister) Available Help at  Discharge: Family Type of Home: House Home Access: Stairs to enter Entrance Stairs-Rails: Right;Left;Can reach both Secretary/administrator of Steps: 3   Home Layout: One level Home Equipment: Grab bars - tub/shower Additional Comments: home O2    Prior Function Prior Level of Function : Independent/Modified Independent             Mobility  Comments: ambulatory without AD, but cannot tolerate long distances due to SOB/COPD ADLs Comments: independent; sister drives and completes IADLs     Extremity/Trunk Assessment   Upper Extremity Assessment Upper Extremity Assessment: Defer to OT evaluation;Overall WFL for tasks assessed    Lower Extremity Assessment Lower Extremity Assessment: Overall WFL for tasks assessed       Communication   Communication Communication: No apparent difficulties    Cognition Arousal: Alert Behavior During Therapy: Flat affect   PT - Cognitive impairments: No apparent impairments                         Following commands: Intact       Cueing Cueing Techniques: Verbal cues     General Comments General comments (skin integrity, edema, etc.): pt on 3L of supplemental O2 via Trainer throughout    Exercises     Assessment/Plan    PT Assessment Patient needs continued PT services  PT Problem List Decreased activity tolerance;Decreased mobility;Cardiopulmonary status limiting activity       PT Treatment Interventions DME instruction;Stair training;Gait training;Functional mobility training;Therapeutic exercise;Therapeutic activities;Balance training;Neuromuscular re-education;Patient/family education    PT Goals (Current goals can be found in the Care Plan section)  Acute Rehab PT Goals Patient Stated Goal: to feel better PT Goal Formulation: With patient Time For Goal Achievement: 10/01/24 Potential to Achieve Goals: Good    Frequency Min 2X/week     Co-evaluation               AM-PAC PT 6 Clicks Mobility  Outcome Measure Help needed turning from your back to your side while in a flat bed without using bedrails?: None Help needed moving from lying on your back to sitting on the side of a flat bed without using bedrails?: None Help needed moving to and from a bed to a chair (including a wheelchair)?: None Help needed standing up from a chair using your arms  (e.g., wheelchair or bedside chair)?: None Help needed to walk in hospital room?: A Little Help needed climbing 3-5 steps with a railing? : A Little 6 Click Score: 22    End of Session Equipment Utilized During Treatment: Oxygen  Activity Tolerance: Patient tolerated treatment well Patient left: in bed;with call bell/phone within reach Nurse Communication: Mobility status;Other (comment) (pt requesting nebulizer treatment) PT Visit Diagnosis: Other abnormalities of gait and mobility (R26.89)    Time: 9152-9099 PT Time Calculation (min) (ACUTE ONLY): 13 min   Charges:   PT Evaluation $PT Eval Low Complexity: 1 Low   PT General Charges $$ ACUTE PT VISIT: 1 Visit         Delon DELENA KLEIN, DPT  Acute Rehabilitation Services Office [phone]   Delon HERO Scottlyn Mchaney 09/17/2024, 9:53 AM

## 2024-09-17 NOTE — Evaluation (Signed)
 Occupational Therapy Evaluation Patient Details Name: Bryan Kelley MRN: [id number] DOB: December 24, 1955 Today's Date: 09/17/2024   History of Present Illness   Pt is a 68 y/o male admitted secondary to SOB, increased HR and general weakness. Pt found to have a-flutter. Pt is s/p cardioversion on 09/16/24. PMH including but not limited to hyperlipidemia, hypertension, atrial flutter, and atrial fibrillation on anticoagulation, GERD, BPH, erectile dysfunction, COPD on chronic 3 L nasal cannula.     Clinical Impressions Upon entering the room, pt seated on EOB and agreeable to OT evaluation. Pt is partially dressed and reports having dressed LB without assistance today. Pt endorses living at home with family and being Ind for self care. Family assists with IADLs and he is on home oxygen  of 3Ls via [place]. He ambulates short distances at baseline secondary to SOB. Pt demonstrates ambulation in room without use of AD into bathroom without LOB or assistance. Pt feels that he is at baseline and has no concerns about returning to home. Pt with no acute OT needs at this time. He returns to EOB and then supine independently. Call bell and all needed items within reach.      Functional Status Assessment   Patient has not had a recent decline in their functional status       Precautions/Restrictions   Precautions Precautions: None Recall of Precautions/Restrictions: Intact Precaution/Restrictions Comments: on 3L of O2 at baseline Restrictions Weight Bearing Restrictions Per Provider Order: No     Mobility Bed Mobility Overal bed mobility: Modified Independent                  Transfers Overall transfer level: Needs assistance Equipment used: None Transfers: Sit to/from Stand Sit to Stand: Modified independent (Device/Increase time)                  Balance Overall balance assessment: Needs assistance Sitting-balance support: Feet supported Sitting balance-Leahy Scale:  Good     Standing balance support: During functional activity, No upper extremity supported Standing balance-Leahy Scale: Fair                             ADL either performed or assessed with clinical judgement   ADL Overall ADL's : Modified independent                                             Vision Patient Visual Report: No change from baseline              Pertinent Vitals/Pain Pain Assessment Pain Assessment: No/denies pain     Extremity/Trunk Assessment Upper Extremity Assessment Upper Extremity Assessment: Overall WFL for tasks assessed   Lower Extremity Assessment Lower Extremity Assessment: Overall WFL for tasks assessed       Communication Communication Communication: No apparent difficulties   Cognition Arousal: Alert Behavior During Therapy: WFL for tasks assessed/performed Cognition: No apparent impairments                               Following commands: Intact                  Home Living Family/patient expects to be discharged to:: Private residence Living Arrangements: Other relatives (sister) Available Help at Discharge: Family Type of Home: House Home  Access: Stairs to enter [employer] of Steps: 3 Entrance Stairs-Rails: Right;Left;Can reach both Home Layout: One level     Bathroom Shower/Tub: Tub/shower unit         Home Equipment: Grab bars - tub/shower   Additional Comments: home O2      Prior Functioning/Environment Prior Level of Function : Independent/Modified Independent             Mobility Comments: ambulatory without AD, but cannot tolerate long distances due to SOB/COPD ADLs Comments: independent; sister drives and completes IADLs            OT Goals(Current goals can be found in the care plan section)   Acute Rehab OT Goals Patient Stated Goal: to go home OT Goal Formulation: With patient Time For Goal Achievement: 09/17/24 Potential to  Achieve Goals: Fair   AM-PAC OT 6 Clicks Daily Activity     Outcome Measure Help from another person eating meals?: None Help from another person taking care of personal grooming?: None Help from another person toileting, which includes using toliet, bedpan, or urinal?: None Help from another person bathing (including washing, rinsing, drying)?: None Help from another person to put on and taking off regular upper body clothing?: None Help from another person to put on and taking off regular lower body clothing?: None 6 Click Score: 24   End of Session    Activity Tolerance: Patient tolerated treatment well Patient left: in bed                   Time: 8687-8677 OT Time Calculation (min): 10 min Charges:  OT General Charges $OT Visit: 1 Visit OT Evaluation $OT Eval Low Complexity: 1 Low  Izetta Claude, MS, OTR/L , CBIS ascom [phone]  09/17/24, 1:30 PM

## 2024-09-26 ENCOUNTER — Encounter: Payer: Self-pay | Admitting: *Deleted

## 2024-09-27 ENCOUNTER — Ambulatory Visit: Admitting: Urology

## 2024-09-27 DIAGNOSIS — N400 Enlarged prostate without lower urinary tract symptoms: Secondary | ICD-10-CM

## 2024-10-01 ENCOUNTER — Inpatient Hospital Stay
Admission: EM | Admit: 2024-10-01 | Discharge: 2024-10-05 | DRG: 193 | Disposition: A | Attending: Internal Medicine | Admitting: Internal Medicine

## 2024-10-01 ENCOUNTER — Emergency Department

## 2024-10-01 ENCOUNTER — Other Ambulatory Visit: Payer: Self-pay

## 2024-10-01 DIAGNOSIS — I4891 Unspecified atrial fibrillation: Secondary | ICD-10-CM | POA: Diagnosis present

## 2024-10-01 DIAGNOSIS — J189 Pneumonia, unspecified organism: Secondary | ICD-10-CM

## 2024-10-01 DIAGNOSIS — I4892 Unspecified atrial flutter: Secondary | ICD-10-CM

## 2024-10-01 DIAGNOSIS — J441 Chronic obstructive pulmonary disease with (acute) exacerbation: Principal | ICD-10-CM

## 2024-10-01 LAB — APTT: aPTT: 88 s — ABNORMAL HIGH (ref 24–36)

## 2024-10-01 LAB — PROTIME-INR
INR: 1.2 (ref 0.8–1.2)
Prothrombin Time: 16.4 s — ABNORMAL HIGH (ref 11.4–15.2)

## 2024-10-01 LAB — CBC WITH DIFFERENTIAL/PLATELET
Abs Immature Granulocytes: 0.03 K/uL (ref 0.00–0.07)
Basophils Absolute: 0.1 K/uL (ref 0.0–0.1)
Basophils Relative: 1 %
Eosinophils Absolute: 0.6 K/uL — ABNORMAL HIGH (ref 0.0–0.5)
Eosinophils Relative: 7 %
HCT: 38.9 % — ABNORMAL LOW (ref 39.0–52.0)
Hemoglobin: 12.5 g/dL — ABNORMAL LOW (ref 13.0–17.0)
Immature Granulocytes: 0 %
Lymphocytes Relative: 30 %
Lymphs Abs: 2.7 K/uL (ref 0.7–4.0)
MCH: 28.3 pg (ref 26.0–34.0)
MCHC: 32.1 g/dL (ref 30.0–36.0)
MCV: 88.2 fL (ref 80.0–100.0)
Monocytes Absolute: 0.8 K/uL (ref 0.1–1.0)
Monocytes Relative: 9 %
Neutro Abs: 4.8 K/uL (ref 1.7–7.7)
Neutrophils Relative %: 53 %
Platelets: 442 K/uL — ABNORMAL HIGH (ref 150–400)
RBC: 4.41 MIL/uL (ref 4.22–5.81)
RDW: 14 % (ref 11.5–15.5)
WBC: 9.1 K/uL (ref 4.0–10.5)
nRBC: 0 % (ref 0.0–0.2)

## 2024-10-01 LAB — BASIC METABOLIC PANEL WITH GFR
Anion gap: 10 (ref 5–15)
BUN: 12 mg/dL (ref 8–23)
CO2: 30 mmol/L (ref 22–32)
Calcium: 9 mg/dL (ref 8.9–10.3)
Chloride: 99 mmol/L (ref 98–111)
Creatinine, Ser: 0.7 mg/dL (ref 0.61–1.24)
GFR, Estimated: >60 mL/min (ref >60)
Glucose, Bld: 110 mg/dL — ABNORMAL HIGH (ref 70–99)
Potassium: 4.9 mmol/L (ref 3.5–5.1)
Sodium: 138 mmol/L (ref 135–145)

## 2024-10-01 LAB — HEPARIN LEVEL (UNFRACTIONATED): Heparin Unfractionated: 0.43 [IU]/mL (ref 0.30–0.70)

## 2024-10-01 LAB — RESP PANEL BY RT-PCR (RSV, FLU A&B, COVID)  RVPGX2
Influenza A by PCR: NEGATIVE
Influenza B by PCR: NEGATIVE
Resp Syncytial Virus by PCR: NEGATIVE
SARS Coronavirus 2 by RT PCR: NEGATIVE

## 2024-10-01 LAB — MRSA NEXT GEN BY PCR, NASAL: MRSA by PCR Next Gen: NOT DETECTED

## 2024-10-01 LAB — TROPONIN T, HIGH SENSITIVITY
Troponin T High Sensitivity: 26 ng/L — ABNORMAL HIGH (ref 0–19)
Troponin T High Sensitivity: 22 ng/L — ABNORMAL HIGH (ref 0–19)

## 2024-10-01 MED ORDER — PREDNISONE 20 MG PO TABS
40.0000 mg | ORAL_TABLET | Freq: Every day | ORAL | Status: AC
  Administered 2024-10-02 – 2024-10-05 (×4): 40 mg via ORAL
  Filled 2024-10-01: qty 2
  Filled 2024-10-01: qty 4
  Filled 2024-10-01 (×2): qty 2

## 2024-10-01 MED ORDER — IPRATROPIUM-ALBUTEROL 0.5-2.5 (3) MG/3ML IN SOLN
6.0000 mL | Freq: Once | RESPIRATORY_TRACT | Status: AC
  Administered 2024-10-01: 6 mL via RESPIRATORY_TRACT
  Filled 2024-10-01: qty 6

## 2024-10-01 MED ORDER — SODIUM CHLORIDE 0.9 % IV SOLN
1.0000 g | Freq: Once | INTRAVENOUS | Status: AC
  Administered 2024-10-01: 1 g via INTRAVENOUS
  Filled 2024-10-01: qty 10

## 2024-10-01 MED ORDER — DILTIAZEM HCL 25 MG/5ML IV SOLN
15.0000 mg | Freq: Once | INTRAVENOUS | Status: AC
  Administered 2024-10-01: 15 mg via INTRAVENOUS
  Filled 2024-10-01: qty 5

## 2024-10-01 MED ORDER — BRIMONIDINE TARTRATE 0.2 % OP SOLN
1.0000 [drp] | Freq: Two times a day (BID) | OPHTHALMIC | Status: DC
  Administered 2024-10-01 – 2024-10-05 (×8): 1 [drp] via OPHTHALMIC
  Filled 2024-10-01: qty 5

## 2024-10-01 MED ORDER — IPRATROPIUM-ALBUTEROL 0.5-2.5 (3) MG/3ML IN SOLN
3.0000 mL | Freq: Four times a day (QID) | RESPIRATORY_TRACT | Status: DC
  Administered 2024-10-01 – 2024-10-05 (×14): 3 mL via RESPIRATORY_TRACT
  Filled 2024-10-01 (×14): qty 3

## 2024-10-01 MED ORDER — AMIODARONE HCL 200 MG PO TABS
200.0000 mg | ORAL_TABLET | Freq: Every day | ORAL | Status: DC
  Administered 2024-10-02 – 2024-10-05 (×4): 200 mg via ORAL
  Filled 2024-10-01 (×4): qty 1

## 2024-10-01 MED ORDER — TAMSULOSIN HCL 0.4 MG PO CAPS
0.4000 mg | ORAL_CAPSULE | Freq: Every day | ORAL | Status: DC
  Administered 2024-10-02 – 2024-10-05 (×4): 0.4 mg via ORAL
  Filled 2024-10-01 (×4): qty 1

## 2024-10-01 MED ORDER — DILTIAZEM HCL-DEXTROSE 125-5 MG/125ML-% IV SOLN (PREMIX)
5.0000 mg/h | INTRAVENOUS | Status: DC
  Administered 2024-10-01 – 2024-10-02 (×2): 7.5 mg/h via INTRAVENOUS
  Filled 2024-10-01: qty 125

## 2024-10-01 MED ORDER — BRINZOLAMIDE 1 % OP SUSP
1.0000 [drp] | Freq: Two times a day (BID) | OPHTHALMIC | Status: DC
  Administered 2024-10-01 – 2024-10-05 (×8): 1 [drp] via OPHTHALMIC
  Filled 2024-10-01: qty 10

## 2024-10-01 MED ORDER — HEPARIN BOLUS VIA INFUSION
4000.0000 [IU] | Freq: Once | INTRAVENOUS | Status: AC
  Administered 2024-10-01: 4000 [IU] via INTRAVENOUS
  Filled 2024-10-01: qty 4000

## 2024-10-01 MED ORDER — ALBUTEROL SULFATE (2.5 MG/3ML) 0.083% IN NEBU
2.5000 mg | INHALATION_SOLUTION | RESPIRATORY_TRACT | Status: DC | PRN
  Administered 2024-10-01 – 2024-10-04 (×5): 2.5 mg via RESPIRATORY_TRACT
  Filled 2024-10-01 (×4): qty 3

## 2024-10-01 MED ORDER — HEPARIN (PORCINE) 25000 UT/250ML-% IV SOLN
1300.0000 [IU]/h | INTRAVENOUS | Status: DC
  Administered 2024-10-01: 1150 [IU]/h via INTRAVENOUS
  Filled 2024-10-01 (×2): qty 250

## 2024-10-01 MED ORDER — MONTELUKAST SODIUM 10 MG PO TABS
10.0000 mg | ORAL_TABLET | Freq: Every day | ORAL | Status: DC
  Administered 2024-10-01 – 2024-10-04 (×4): 10 mg via ORAL
  Filled 2024-10-01 (×4): qty 1

## 2024-10-01 MED ORDER — ATORVASTATIN CALCIUM 80 MG PO TABS
80.0000 mg | ORAL_TABLET | Freq: Every day | ORAL | Status: DC
  Administered 2024-10-01 – 2024-10-05 (×5): 80 mg via ORAL
  Filled 2024-10-01: qty 1
  Filled 2024-10-01 (×2): qty 4
  Filled 2024-10-01 (×2): qty 1

## 2024-10-01 MED ORDER — SODIUM CHLORIDE 0.9 % IV SOLN
100.0000 mg | Freq: Two times a day (BID) | INTRAVENOUS | Status: DC
  Administered 2024-10-02 – 2024-10-03 (×3): 100 mg via INTRAVENOUS
  Filled 2024-10-01 (×6): qty 100

## 2024-10-01 MED ORDER — ACETAMINOPHEN 325 MG PO TABS: 650.0000 mg | ORAL_TABLET | ORAL | Status: DC | PRN

## 2024-10-01 MED ORDER — POLYETHYLENE GLYCOL 3350 17 G PO PACK
17.0000 g | PACK | Freq: Two times a day (BID) | ORAL | Status: DC
  Administered 2024-10-01 – 2024-10-04 (×3): 17 g via ORAL
  Filled 2024-10-01 (×6): qty 1

## 2024-10-01 MED ORDER — METHYLPREDNISOLONE SODIUM SUCC 125 MG IJ SOLR
125.0000 mg | Freq: Once | INTRAMUSCULAR | Status: AC
  Administered 2024-10-01: 125 mg via INTRAVENOUS
  Filled 2024-10-01: qty 2

## 2024-10-01 MED ORDER — DILTIAZEM HCL-DEXTROSE 125-5 MG/125ML-% IV SOLN (PREMIX)
5.0000 mg/h | INTRAVENOUS | Status: DC
  Administered 2024-10-01: 5 mg/h via INTRAVENOUS
  Filled 2024-10-01: qty 125

## 2024-10-01 MED ORDER — PANTOPRAZOLE SODIUM 20 MG PO TBEC
20.0000 mg | DELAYED_RELEASE_TABLET | Freq: Every day | ORAL | Status: DC
  Administered 2024-10-02 – 2024-10-05 (×4): 20 mg via ORAL
  Filled 2024-10-01 (×4): qty 1

## 2024-10-01 MED ORDER — SODIUM CHLORIDE 0.9 % IV SOLN
100.0000 mg | Freq: Once | INTRAVENOUS | Status: AC
  Administered 2024-10-01: 100 mg via INTRAVENOUS
  Filled 2024-10-01: qty 100

## 2024-10-01 MED ORDER — DILTIAZEM HCL 25 MG/5ML IV SOLN
20.0000 mg | Freq: Once | INTRAVENOUS | Status: AC
  Administered 2024-10-01: 20 mg via INTRAVENOUS
  Filled 2024-10-01: qty 5

## 2024-10-01 MED ORDER — SODIUM CHLORIDE 0.9 % IV SOLN: 500.0000 mg | INTRAVENOUS | Status: DC

## 2024-10-01 MED ORDER — SODIUM CHLORIDE 0.9 % IV SOLN
2.0000 g | INTRAVENOUS | Status: DC
  Administered 2024-10-02 – 2024-10-04 (×3): 2 g via INTRAVENOUS
  Filled 2024-10-01 (×4): qty 20

## 2024-10-01 MED ORDER — ONDANSETRON HCL 4 MG/2ML IJ SOLN
4.0000 mg | Freq: Four times a day (QID) | INTRAMUSCULAR | Status: DC | PRN
  Filled 2024-10-01: qty 2

## 2024-10-01 NOTE — H&P (Addendum)
 HISTORY AND PHYSICAL    Bryan Kelley   FMW:[id number] DOB: [birth year]   Date of Service: 10/01/24 Requesting physician/APP from ED: Treatment Team:  Attending Provider: [name], DO  PCP: [facility]     HPI: Bryan Kelley is a 68 y.o. male with history of a flutter/A-fib on Eliquis , history of COPD on 3 L nasal cannula at baseline, history of GERD, presenting with shortness of breath and cough.  States new productive cough with white sputum 2 days ago.  Has been having increasing shortness of breath at the time.  States he does not have any chest pain right now, only notes some left-sided chest discomfort when taking deep breath.  Has been compliant with all his medications including his amiodarone  and Eliquis .  Denies any fever.  No leg swelling.   Came to ED 10/01/24 and noted pneumonia and atrial fibrillation RVR which required initiation of diltiazem  infusion. CXR concerning for possible pneumonia, on personal review there is no lobar consolidate. ED also gave ceftriaxone  + doxycycline. Admitted to hospitalist.     Consultants:  Cardiology   Procedures: none      ASSESSMENT & PLAN:   Atrial fibrillation/flutter with RVR Likely d/t PNA/COPD exacerbation Treat underlying respiratory illness(es) as below Diltiazem  drip initiated in the ED just prior to admission. Will sign out to night tam, pt  may Pt on Eliquis  at home - will do heparin  drip here for now in case procedures needed oral amiodarone  Hold apixaban  5 mg twice daily, heparin  gor now Will consult cardiology   Coronary artery disease, nonobstructive cardiac catheterization 2 months ago showed mild-moderate, nonobstructive disease other than a relatively small diagonal that had significant ostial stenosis (not amenable to intervention.   Trend troponin now, expect somewhat elevated d/t demand ischemia   high intensity statin  NO aspirin      COPD (chronic obstructive pulmonary  disease) exacerbation due to Pneumonia Steroids Bronchodilators  Expectorant Tx pneumonia as below   Pneumonia, concern w/ recent hospitalization but symptoms developed >72h post discharge Neg COVID/flu/RSV CXR on personal review is not showing lobar consolidate, this may be COPD alone but given risk w/ recent admission will treat  MRSA coverage given recent hospitalization. Can anticipate deescalate if MRSA screen neg.  Ceftriaxone  + doxycycline (avoid  azithro w/ long qt) Legionella, strep pneumo, resp PCR, MRSA screen  Chronic hypoxic respiratory failure on home O2 3L [place] - currently on baseline O2 Continue  oxygen  3 L to titrate as needed   Essential hypertension Home diltiazem  120 mg p.o. daily held while on the drip   GERD (gastroesophageal reflux disease) PPI    Hyperlipidemia atorvastatin  80 mg nightly         overweight based on BMI: Body mass index is 27.43 kg/m.[name] Significantly low or high BMI is associated with higher medical risk.  Underweight - under 18  overweight - 25 to 29 obese - 30 or more Class 1 obesity: BMI of 30.0 to 34 Class 2 obesity: BMI of 35.0 to 39 Class 3 obesity: BMI of 40.0 to 49 Super Morbid Obesity: BMI 50-59 Super-super Morbid Obesity: BMI 60+ Healthy nutrition and physical activity advised as adjunct to other disease management and risk reduction treatments      DVT prophylaxis: heparin  Pertinent IV fluids/nutrition: cardiac diet, NPO p midnight  Central lines / invasive devices: none  Code Status: FULL CODE Family Communication: sister on speakerphone  Disposition: inpatient progressive unit  TOC needs: TBD Barriers to  discharge / significant pending items: Afib w/ drips as above                   Review of Systems:  Review of Systems  Constitutional:  Negative for chills, fever, malaise/fatigue and weight loss.  HENT:  Negative for sinus pain and sore throat.   Respiratory:  Positive for cough, sputum  production, shortness of breath and wheezing. Negative for hemoptysis.   Cardiovascular:  Positive for palpitations. Negative for chest pain, orthopnea and leg swelling.  Gastrointestinal:  Negative for heartburn, nausea and vomiting.  Genitourinary:  Negative for frequency and urgency.  Musculoskeletal:  Negative for myalgias.  Neurological:  Positive for weakness. Negative for dizziness and focal weakness.  Psychiatric/Behavioral:  Negative for depression.        has a past medical history of Acute bronchitis (05/07/2009), Acute on chronic hypoxic respiratory failure (HCC) (10/06/2020), Acute on chronic respiratory failure with hypercapnia (HCC) (12/06/2019), Acute on chronic respiratory failure with hypoxia and hypercapnia (HCC) (07/28/2024), Acute respiratory disease due to COVID-19 virus (12/06/2019), Asthma, COPD (chronic obstructive pulmonary disease) (HCC), COPD with acute exacerbation (HCC) (10/29/2019), Depression, GERD (gastroesophageal reflux disease), Inferior ST segment elevation (07/28/2024), Sinus tachycardia (07/30/2024), and Vision loss, left eye. (Not in an outpatient encounter)   Allergies  Allergen Reactions   Aspirin       family history includes Asthma in his father; Diabetes in his brother, father, mother, and sister; Hypertension in his brother, mother, and sister; Stroke in his mother. Past Surgical History:  Procedure Laterality Date   CARDIOVERSION N/A 08/16/2024   Procedure: CARDIOVERSION;  Surgeon: Darliss Rogue, MD;  Location: [facility] ORS;  Service: Cardiovascular;  Laterality: N/A;   CARDIOVERSION N/A 09/16/2024   Procedure: CARDIOVERSION;  Surgeon: Argentina Clap, MD;  Location: [facility] ORS;  Service: Cardiovascular;  Laterality: N/A;   EYE SURGERY  2009, about [id number],8010   steel in left eye on the job, legally blind    LEFT HEART CATH AND CORONARY ANGIOGRAPHY N/A 07/28/2024   Procedure: LEFT HEART CATH AND CORONARY ANGIOGRAPHY;  Surgeon: Anner Alm ORN, MD;  Location: [facility] INVASIVE CV LAB;  Service: Cardiovascular;  Laterality: N/A;   TEE WITHOUT CARDIOVERSION N/A 08/16/2024   Procedure: ECHOCARDIOGRAM, TRANSESOPHAGEAL;  Surgeon: Darliss Rogue, MD;  Location: [facility] ORS;  Service: Cardiovascular;  Laterality: N/A;          Objective Findings:  Vitals:   10/01/24 1556 10/01/24 1557 10/01/24 1558  Pulse: (!) 152    Resp: 20    Temp:   98.1 F (36.7 C)  TempSrc:   Oral  SpO2: 98%    Weight:  81.6 kg   Height:  5' 6 (1.676 m)     Intake/Output Summary (Last 24 hours) at 10/01/2024 1819 Last data filed at 10/01/2024 1754 Gross per 24 hour  Intake 100 ml  Output --  Net 100 ml   Filed Weights   10/01/24 1557  Weight: 81.6 kg    Examination:  Physical Exam Constitutional:      General: He is not in acute distress. Cardiovascular:     Rate and Rhythm: Tachycardia present. Rhythm irregular.  Pulmonary:     Breath sounds: Decreased breath sounds and wheezing present. No rhonchi or rales.  Musculoskeletal:     Right lower leg: No edema.     Left lower leg: No edema.  Skin:    General: Skin is warm and dry.  Neurological:     General: No focal deficit present.  Mental Status: He is alert and oriented to person, place, and time.  Psychiatric:        Mood and Affect: Mood normal.        Behavior: Behavior normal.          Scheduled Medications:    Continuous Infusions:  diltiazem  (CARDIZEM ) infusion     doxycycline (VIBRAMYCIN) IV 100 mg (10/01/24 1621)    PRN Medications:  acetaminophen , ondansetron  (ZOFRAN ) IV  Antimicrobials:  Anti-infectives (From admission, onward)    Start     Dose/Rate Route Frequency Ordered Stop   10/01/24 1700  cefTRIAXone  (ROCEPHIN ) 1 g in sodium chloride  0.9 % 100 mL IVPB        1 g 200 mL/hr over 30 Minutes Intravenous  Once 10/01/24 1653 10/01/24 1754   10/01/24 1630  doxycycline (VIBRAMYCIN) 100 mg in sodium chloride  0.9 % 250 mL IVPB        100 mg 125  mL/hr over 120 Minutes Intravenous  Once 10/01/24 1605             Data Reviewed: I have personally reviewed following labs and imaging studies  CBC: Recent Labs  Lab 10/01/24 1600  WBC 9.1  NEUTROABS 4.8  HGB 12.5*  HCT 38.9*  MCV 88.2  PLT 442*   Basic Metabolic Panel: Recent Labs  Lab 10/01/24 1600  NA 138  K 4.9  CL 99  CO2 30  GLUCOSE 110*  BUN 12  CREATININE 0.70  CALCIUM  9.0   GFR: Estimated Creatinine Clearance: 89.9 mL/min (by C-G formula based on SCr of 0.7 mg/dL). Liver Function Tests: No results for input(s): AST, ALT, ALKPHOS, BILITOT, PROT, ALBUMIN in the last 168 hours. No results for input(s): LIPASE, AMYLASE in the last 168 hours. No results for input(s): AMMONIA in the last 168 hours. Coagulation Profile: No results for input(s): INR, PROTIME in the last 168 hours. Cardiac Enzymes: No results for input(s): CKTOTAL, CKMB, CKMBINDEX, TROPONINI in the last 168 hours. BNP (last 3 results) No results for input(s): PROBNP in the last 8760 hours. HbA1C: No results for input(s): HGBA1C in the last 72 hours. CBG: No results for input(s): GLUCAP in the last 168 hours. Lipid Profile: No results for input(s): CHOL, HDL, LDLCALC, TRIG, CHOLHDL, LDLDIRECT in the last 72 hours. Thyroid  Function Tests: No results for input(s): TSH, T4TOTAL, FREET4, T3FREE, THYROIDAB in the last 72 hours. Anemia Panel: No results for input(s): VITAMINB12, FOLATE, FERRITIN, TIBC, IRON, RETICCTPCT in the last 72 hours. Most Recent Urinalysis On File:  No results found for: COLORURINE, APPEARANCEUR, LABSPEC, PHURINE, GLUCOSEU, HGBUR, BILIRUBINUR, KETONESUR, PROTEINUR, UROBILINOGEN, NITRITE, LEUKOCYTESUR Sepsis Labs: @LABRCNTIP (procalcitonin:4,lacticidven:4)  Recent Results (from the past 240 hours)  Resp panel by RT-PCR (RSV, Flu A&B, Covid) Anterior Nasal Swab     Status: None    Collection Time: 10/01/24  4:00 PM   Specimen: Anterior Nasal Swab  Result Value Ref Range Status   SARS Coronavirus 2 by RT PCR NEGATIVE NEGATIVE Final    Comment: (NOTE) SARS-CoV-2 target nucleic acids are NOT DETECTED.  The SARS-CoV-2 RNA is generally detectable in upper respiratory specimens during the acute phase of infection. The lowest concentration of SARS-CoV-2 viral copies this assay can detect is 138 copies/mL. A negative result does not preclude SARS-Cov-2 infection and should not be used as the sole basis for treatment or other patient management decisions. A negative result may occur with  improper specimen collection/handling, submission of specimen other than nasopharyngeal swab, presence of viral mutation(s) within the  areas targeted by this assay, and inadequate number of viral copies(<138 copies/mL). A negative result must be combined with clinical observations, patient history, and epidemiological information. The expected result is Negative.  Fact Sheet for Patients:  bloggercourse.com  Fact Sheet for Healthcare Providers:  seriousbroker.it  This test is no t yet approved or cleared by the [place]  FDA and  has been authorized for detection and/or diagnosis of SARS-CoV-2 by FDA under an Emergency Use Authorization (EUA). This EUA will remain  in effect (meaning this test can be used) for the duration of the COVID-19 declaration under Section 564(b)(1) of the Act, 21 U.S.C.section 360bbb-3(b)(1), unless the authorization is terminated  or revoked sooner.       Influenza A by PCR NEGATIVE NEGATIVE Final   Influenza B by PCR NEGATIVE NEGATIVE Final    Comment: (NOTE) The Xpert Xpress SARS-CoV-2/FLU/RSV plus assay is intended as an aid in the diagnosis of influenza from Nasopharyngeal swab specimens and should not be used as a sole basis for treatment. Nasal washings and aspirates are unacceptable for  Xpert Xpress SARS-CoV-2/FLU/RSV testing.  Fact Sheet for Patients: bloggercourse.com  Fact Sheet for Healthcare Providers: seriousbroker.it  This test is not yet approved or cleared by the [place]  FDA and has been authorized for detection and/or diagnosis of SARS-CoV-2 by FDA under an Emergency Use Authorization (EUA). This EUA will remain in effect (meaning this test can be used) for the duration of the COVID-19 declaration under Section 564(b)(1) of the Act, 21 U.S.C. section 360bbb-3(b)(1), unless the authorization is terminated or revoked.     Resp Syncytial Virus by PCR NEGATIVE NEGATIVE Final    Comment: (NOTE) Fact Sheet for Patients: bloggercourse.com  Fact Sheet for Healthcare Providers: seriousbroker.it  This test is not yet approved or cleared by the [place]  FDA and has been authorized for detection and/or diagnosis of SARS-CoV-2 by FDA under an Emergency Use Authorization (EUA). This EUA will remain in effect (meaning this test can be used) for the duration of the COVID-19 declaration under Section 564(b)(1) of the Act, 21 U.S.C. section 360bbb-3(b)(1), unless the authorization is terminated or revoked.  Performed at [facility], [address], [place], [place] [address]          Radiology Studies: DG Chest 1 View Result Date: 10/01/2024 CLINICAL DATA:  Short of breath. EXAM: CHEST  1 VIEW COMPARISON:  09/15/2024 and older studies.  CT, 08/10/2024. FINDINGS: Cardiac silhouette is normal in size. No mediastinal or hilar masses. Left lung base opacity appears increased from the most recent prior chest radiograph. Remainder of the lungs is clear. No pneumothorax. Skeletal structures are grossly intact. IMPRESSION: 1. Left lung base opacity appears increased from the most recent prior study. Prior study lateral view demonstrated a small  associated pleural effusion. Findings consistent with left lower lobe pneumonia in the proper clinical setting. Electronically Signed   By: Alm Parkins M.D.   On: 10/01/2024 16:42             LOS: 0 days       Andreyah Natividad, DO [facility] 10/01/2024, 6:19 PM    Dictation software may have been used to generate the above note. Typos may occur and escape review in typed/dictated notes. Please contact Dr Marsa directly for clarity if needed.  Staff may message me via secure chat in Epic  but this may not receive an immediate response,  please page me for urgent matters!  If 7PM-7AM, please contact night coverage  [email or web address]

## 2024-10-01 NOTE — ED Notes (Signed)
Pt given milk and graham crackers.

## 2024-10-01 NOTE — ED Triage Notes (Signed)
 Pt BIB [facility] from home c/o worsening sob for the past 2 days. Pt does have COPD and wears 3L [place] chronically. 73% on 3L.  Pt endorses a cough with white frothy phlegm Pt endorses L sided chest pain. Recently had a cardioversion and has been in afib RVR 60-160 with EMS. EMS gave 1.25 solumedrol, 2 albuterol 's and one duo neb. Pt was tripoding upon EMS arrival but improved once he received treatment. AxO. NAD. Speaking in full sentences.  97.8 oral temp 151 HR

## 2024-10-01 NOTE — Consult Note (Addendum)
 Pharmacy Consult Note - Anticoagulation  Pharmacy Consult for Heparin  drip  Indication: atrial fibrillation Allergies  Allergen Reactions   Aspirin     PATIENT MEASUREMENTS: Height: 5' 6 (167.6 cm) Weight: 81.6 kg (180 lb) IBW/kg (Calculated) : 63.8 HEPARIN  DW (KG): 80.3  VITAL SIGNS: Temp: 98.1 F (36.7 C) (11/29 1558) Temp Source: Oral (11/29 1558) BP: 128/88 (11/29 1830) Pulse Rate: 145 (11/29 1830)  Recent Labs    10/01/24 1600  HGB 12.5*  HCT 38.9*  PLT 442*  CREATININE 0.70    Estimated Creatinine Clearance: 89.9 mL/min (by C-G formula based on SCr of 0.7 mg/dL).  PAST MEDICAL HISTORY: Past Medical History:  Diagnosis Date   Acute bronchitis 05/07/2009   Qualifier: Diagnosis of   By: Antonetta MD, Margaret         Acute on chronic hypoxic respiratory failure (HCC) 10/06/2020   Acute on chronic respiratory failure with hypercapnia (HCC) 12/06/2019   Acute on chronic respiratory failure with hypoxia and hypercapnia (HCC) 07/28/2024   Acute respiratory disease due to COVID-19 virus 12/06/2019   Asthma    COPD (chronic obstructive pulmonary disease) (HCC)    COPD with acute exacerbation (HCC) 10/29/2019   Depression    GERD (gastroesophageal reflux disease)    Inferior ST segment elevation 07/28/2024   Sinus tachycardia 07/30/2024   Vision loss, left eye     Medications:  (Not in a hospital admission)  Scheduled:   [START ON 10/02/2024] amiodarone   200 mg Oral Daily   atorvastatin   80 mg Oral Daily   brimonidine   1 drop Left Eye BID   brinzolamide   1 drop Left Eye BID   heparin   4,000 Units Intravenous Once   ipratropium-albuterol   3 mL Nebulization Q6H WA   methylPREDNISolone  (SOLU-MEDROL ) injection  125 mg Intravenous Once   montelukast   10 mg Oral QHS   [START ON 10/02/2024] pantoprazole   20 mg Oral Daily   polyethylene glycol  17 g Oral BID   [START ON 10/02/2024] predniSONE   40 mg Oral Q breakfast   [START ON 10/02/2024] tamsulosin   0.4 mg Oral  Daily   Infusions:   [START ON 10/02/2024] cefTRIAXone  (ROCEPHIN )  IV     diltiazem  (CARDIZEM ) infusion 10 mg/hr (10/01/24 1908)   [START ON 10/02/2024] doxycycline (VIBRAMYCIN) IV     heparin      PRN: acetaminophen , albuterol , ondansetron  (ZOFRAN ) IV  ASSESSMENT: 68 y.o. male with PMH a flutter/A-fib on Eliquis , history of COPD  is presenting with SOB.  Pharmacy has been consulted to initiate and manage heparin  intravenous infusion. Last dose of Eliquis  was 11/28 @ 10AM. Confirmed that patient has not taken dose today.   Goal(s) of therapy: Heparin  level 0.3 - 0.7 units/mL aPTT 66 - 102 seconds Monitor platelets by anticoagulation protocol: Yes   Baseline anticoagulation labs: Recent Labs    10/01/24 1600  HGB 12.5*  PLT 442*    PLAN:  Give 4000 units bolus x1; then start heparin  infusion at 1150 units/hour.  Check aPTT in 6 hours.  Continue to titrate by aPTT until heparin  level and aPTT correlate, then titrate by heparin  level alone.  Check heparin  level with next AM labs.  Continue to monitor CBC daily while on heparin  infusion.   Annabella LOISE Banks, PharmD Clinical Pharmacist 10/01/2024 7:14 PM

## 2024-10-01 NOTE — ED Provider Notes (Signed)
 [name] Provider Note    Event Date/Time   First MD Initiated Contact with Patient 10/01/24 1554     (approximate)   History   Shortness of Breath   HPI  Bryan Kelley is a 68 y.o. male with history of a flutter/A-fib on Eliquis , history of COPD on 3 L nasal cannula at baseline, history of GERD, presenting with shortness of breath and cough.  States new productive cough with white sputum 2 days ago.  Has been having increasing shortness of breath at the time.  States he does not have any chest pain right now, only notes some left-sided chest discomfort when taking deep breath.  Has been compliant with all his medications including his amiodarone  and Eliquis .  Denies any fever.  No leg swelling, no nausea vomiting or diarrhea or urinary symptoms.   Per independent history from EMS, is noted to be satting in the 70s on 3 L, they gave him 125 Solu-Medrol , 1 DuoNeb and 2 albuterol , he was tripoding initially for them, after treatment states that his symptoms are improving.  He was found to be tachycardic to 151.      On independent chart review, he was admitted for persistent A-fib as a flutter, was placed on Amio drip, was cardioverted back to normal sinus rhythm, was discharged on oral amiodarone .  Had noted left sided chest discomfort with deep inspiration during his last admission as well.  He was discharged with instructions to follow-up with EP for long-term management of his A-flutter/atrial fibrillation.  Physical Exam   Triage Vital Signs: ED Triage Vitals  Encounter Vitals Group     BP --      Girls Systolic BP Percentile --      Girls Diastolic BP Percentile --      Boys Systolic BP Percentile --      Boys Diastolic BP Percentile --      Pulse Rate 10/01/24 1556 (!) 152     Resp 10/01/24 1556 20     Temp 10/01/24 1558 98.1 F (36.7 C)     Temp Source 10/01/24 1558 Oral     SpO2 10/01/24 1556 98 %     Weight 10/01/24 1557 180 lb (81.6 kg)      Height 10/01/24 1557 5' 6 (1.676 m)     Head Circumference --      Peak Flow --      Pain Score 10/01/24 1556 6     Pain Loc --      Pain Education --      Exclude from Growth Chart --     Most recent vital signs: Vitals:   10/01/24 1556 10/01/24 1558  Pulse: (!) 152   Resp: 20   Temp:  98.1 F (36.7 C)  SpO2: 98%      General: Awake, no distress.  CV:  Good peripheral perfusion.  Resp:  Normal effort.  Not in respiratory distress, he has wheezing bilaterally, worse on the left Abd:  No distention.  Soft nontender Other:  No lower extremity edema, no unilateral calf sign or tenderness   ED Results / Procedures / Treatments   Labs (all labs ordered are listed, but only abnormal results are displayed) Labs Reviewed  CBC WITH DIFFERENTIAL/PLATELET - Abnormal; Notable for the following components:      Result Value   Hemoglobin 12.5 (*)    HCT 38.9 (*)    Platelets 442 (*)    Eosinophils Absolute 0.6 (*)  All other components within normal limits  BASIC METABOLIC PANEL WITH GFR - Abnormal; Notable for the following components:   Glucose, Bld 110 (*)    All other components within normal limits  TROPONIN T, HIGH SENSITIVITY - Abnormal; Notable for the following components:   Troponin T High Sensitivity 26 (*)    All other components within normal limits  RESP PANEL BY RT-PCR (RSV, FLU A&B, COVID)  RVPGX2  TROPONIN T, HIGH SENSITIVITY     EKG  EKG shows, EKG read is sinus tachycardia but appears to be more in a flutter RVR, rate 150, widened QRS, QTc is 538, no obvious ischemic ST elevation, T wave flattening in 1, V2, this is changed compared to prior   RADIOLOGY On my independent interpretation, x-ray shows left opacity   PROCEDURES:  Critical Care performed: Yes, see critical care procedure note(s)  .Critical Care  Performed by: Waymond Lorelle Cummins, MD Authorized by: Waymond Lorelle Cummins, MD   Critical care provider statement:    Critical care time (minutes):   40   Critical care was time spent personally by me on the following activities:  Development of treatment plan with patient or surrogate, discussions with consultants, evaluation of patient's response to treatment, examination of patient, ordering and review of laboratory studies, ordering and review of radiographic studies, ordering and performing treatments and interventions, pulse oximetry, re-evaluation of patient's condition and review of old charts    MEDICATIONS ORDERED IN ED: Medications  doxycycline (VIBRAMYCIN) 100 mg in sodium chloride  0.9 % 250 mL IVPB (100 mg Intravenous New Bag/Given 10/01/24 1621)  cefTRIAXone  (ROCEPHIN ) 1 g in sodium chloride  0.9 % 100 mL IVPB (1 g Intravenous New Bag/Given 10/01/24 1722)  diltiazem  (CARDIZEM ) 125 mg in dextrose  5% 125 mL (1 mg/mL) infusion (has no administration in time range)  ipratropium-albuterol  (DUONEB) 0.5-2.5 (3) MG/3ML nebulizer solution 6 mL (6 mLs Nebulization Given 10/01/24 1610)  diltiazem  (CARDIZEM ) injection 15 mg (15 mg Intravenous Given 10/01/24 1610)  diltiazem  (CARDIZEM ) injection 20 mg (20 mg Intravenous Given 10/01/24 1720)     IMPRESSION / MDM / ASSESSMENT AND PLAN / ED COURSE  I reviewed the triage vital signs and the nursing notes.                              Differential diagnosis includes, but is not limited to, COPD exacerbation, viral illness, pneumonia, arrhythmia, atypical ACS, did consider PE but patient does not any unilateral calf swelling or tenderness, states that has been compliant with his Eliquis .  Suspect symptoms are secondary to COPD exacerbation and his A- flutter with RVR.  His blood pressure stable at this time, will give him a dose of IV Dilt here, will give him 2 more DuoNebs.  IV doxycycline for COPD exacerbation.  Reassess.  Patient's presentation is most consistent with acute presentation with potential threat to life or bodily function.  Independent interpretation of labs and imaging below.   Patient persistently tachycardic despite 2 boluses of IV Dilt, placed on a diltiazem  drip.  Seen a lot better after the DuoNebs.  Chest x-ray shows left lower lobe pneumonia, will add ceftriaxone  to his antibiotic regimen.  Given his pneumonia, COPD exacerbation, a flutter with RVR, he will need to be admitted for further management.  Consult the hospitalist will admit the patient.  He is admitted.  The patient is on the cardiac monitor to evaluate for evidence of arrhythmia and/or significant heart  rate changes.   Clinical Course as of 10/01/24 1742  Sat Oct 01, 2024  1653 DG Chest 1 View IMPRESSION: 1. Left lung base opacity appears increased from the most recent prior study. Prior study lateral view demonstrated a small associated pleural effusion. Findings consistent with left lower lobe pneumonia in the proper clinical setting.   [TT]  1653 Independent review of labs, no leukocytosis, troponin is mildly elevated, electrolytes not severely deranged, respiratory viral swab is negative. [TT]  1653 Chest x-ray shows left lower lobe pneumonia, given his new productive cough, will add on ceftriaxone  to his antibiotic regimen. [TT]  1707 Patient still tachycardic to 140s after IV Dilt, will give him another dose of IV Dilt here then put him on a dill drip. [TT]  1732 Second dose of IV Dilt, heart rate still elevated, will put him on dill drip. [TT]    Clinical Course User Index [TT] Waymond Lorelle Cummins, MD     FINAL CLINICAL IMPRESSION(S) / ED DIAGNOSES   Final diagnoses:  COPD exacerbation (HCC)  Pneumonia of left lower lobe due to infectious organism  Atrial flutter with rapid ventricular response (HCC)     Rx / DC Orders   ED Discharge Orders     None        Note:  This document was prepared using Dragon voice recognition software and may include unintentional dictation errors.    Waymond Lorelle Cummins, MD 10/01/24 [phone]

## 2024-10-02 DIAGNOSIS — J189 Pneumonia, unspecified organism: Secondary | ICD-10-CM

## 2024-10-02 DIAGNOSIS — I4892 Unspecified atrial flutter: Secondary | ICD-10-CM | POA: Diagnosis not present

## 2024-10-02 DIAGNOSIS — I4891 Unspecified atrial fibrillation: Secondary | ICD-10-CM | POA: Diagnosis not present

## 2024-10-02 LAB — CBC
HCT: 38.8 % — ABNORMAL LOW (ref 39.0–52.0)
Hemoglobin: 12.2 g/dL — ABNORMAL LOW (ref 13.0–17.0)
MCH: 28 pg (ref 26.0–34.0)
MCHC: 31.4 g/dL (ref 30.0–36.0)
MCV: 89.2 fL (ref 80.0–100.0)
Platelets: 374 K/uL (ref 150–400)
RBC: 4.35 MIL/uL (ref 4.22–5.81)
RDW: 13.6 % (ref 11.5–15.5)
WBC: 8.1 K/uL (ref 4.0–10.5)
nRBC: 0 % (ref 0.0–0.2)

## 2024-10-02 LAB — APTT
aPTT: 59 s — ABNORMAL HIGH (ref 24–36)
aPTT: 82 s — ABNORMAL HIGH (ref 24–36)

## 2024-10-02 LAB — HEPARIN LEVEL (UNFRACTIONATED): Heparin Unfractionated: 0.37 [IU]/mL (ref 0.30–0.70)

## 2024-10-02 LAB — STREP PNEUMONIAE URINARY ANTIGEN: Strep Pneumo Urinary Antigen: NEGATIVE

## 2024-10-02 MED ORDER — LACTULOSE 10 GM/15ML PO SOLN
20.0000 g | Freq: Every day | ORAL | Status: DC | PRN
  Administered 2024-10-02 – 2024-10-05 (×4): 20 g via ORAL
  Filled 2024-10-02 (×4): qty 30

## 2024-10-02 MED ORDER — HEPARIN BOLUS VIA INFUSION
1200.0000 [IU] | Freq: Once | INTRAVENOUS | Status: AC
  Administered 2024-10-02: 1200 [IU] via INTRAVENOUS
  Filled 2024-10-02: qty 1200

## 2024-10-02 MED ORDER — APIXABAN 5 MG PO TABS
5.0000 mg | ORAL_TABLET | Freq: Two times a day (BID) | ORAL | Status: DC
  Administered 2024-10-02 – 2024-10-05 (×6): 5 mg via ORAL
  Filled 2024-10-02 (×6): qty 1

## 2024-10-02 MED ORDER — DILTIAZEM HCL ER COATED BEADS 120 MG PO CP24
120.0000 mg | ORAL_CAPSULE | Freq: Every day | ORAL | Status: DC
  Filled 2024-10-02: qty 1

## 2024-10-02 MED ORDER — DILTIAZEM HCL ER COATED BEADS 180 MG PO CP24
180.0000 mg | ORAL_CAPSULE | Freq: Every day | ORAL | Status: DC
  Administered 2024-10-02 – 2024-10-04 (×3): 180 mg via ORAL
  Filled 2024-10-02 (×3): qty 1

## 2024-10-02 NOTE — Consult Note (Signed)
 Pharmacy Consult Note - Anticoagulation  Pharmacy Consult for Heparin  drip  Indication: atrial fibrillation Allergies  Allergen Reactions   Aspirin     PATIENT MEASUREMENTS: Height: 5' 6 (167.6 cm) Weight: 81.6 kg (180 lb) IBW/kg (Calculated) : 63.8 HEPARIN  DW (KG): 80.3  VITAL SIGNS: Temp: 97.8 F (36.6 C) (11/30 0929) Temp Source: Oral (11/30 0929) BP: 138/80 (11/30 0830) Pulse Rate: 66 (11/30 0830)  Recent Labs    10/01/24 1600 10/01/24 1600 10/01/24 2100 10/02/24 0214 10/02/24 0900  HGB 12.5*  --  12.2*  --   --   HCT 38.9*  --  38.8*  --   --   PLT 442*  --  374  --   --   APTT  --   --  88*   < > 82*  LABPROT  --   --  16.4*  --   --   INR  --   --  1.2  --   --   HEPARINUNFRC  --    < > 0.43  --  0.37  CREATININE 0.70  --   --   --   --    < > = values in this interval not displayed.    Estimated Creatinine Clearance: 89.9 mL/min (by C-G formula based on SCr of 0.7 mg/dL).  PAST MEDICAL HISTORY: Past Medical History:  Diagnosis Date   Acute bronchitis 05/07/2009   Qualifier: Diagnosis of   By: Antonetta MD, Margaret         Acute on chronic hypoxic respiratory failure (HCC) 10/06/2020   Acute on chronic respiratory failure with hypercapnia (HCC) 12/06/2019   Acute on chronic respiratory failure with hypoxia and hypercapnia (HCC) 07/28/2024   Acute respiratory disease due to COVID-19 virus 12/06/2019   Asthma    COPD (chronic obstructive pulmonary disease) (HCC)    COPD with acute exacerbation (HCC) 10/29/2019   Depression    GERD (gastroesophageal reflux disease)    Inferior ST segment elevation 07/28/2024   Sinus tachycardia 07/30/2024   Vision loss, left eye     Medications:  (Not in a hospital admission)  Scheduled:   amiodarone   200 mg Oral Daily   atorvastatin   80 mg Oral Daily   brimonidine   1 drop Left Eye BID   brinzolamide   1 drop Left Eye BID   ipratropium-albuterol   3 mL Nebulization Q6H WA   montelukast   10 mg Oral QHS    pantoprazole   20 mg Oral Daily   polyethylene glycol  17 g Oral BID   predniSONE   40 mg Oral Q breakfast   tamsulosin   0.4 mg Oral Daily   Infusions:   cefTRIAXone  (ROCEPHIN )  IV     diltiazem  (CARDIZEM ) infusion 5 mg/hr (10/02/24 0943)   doxycycline  (VIBRAMYCIN ) IV Stopped (10/02/24 0754)   heparin  1,300 Units/hr (10/02/24 0304)   PRN: acetaminophen , albuterol , ondansetron  (ZOFRAN ) IV  ASSESSMENT: 68 y.o. male with PMH a flutter/A-fib on Eliquis , history of COPD  is presenting with SOB.  Pharmacy has been consulted to initiate and manage heparin  intravenous infusion. Last dose of Eliquis  was 11/28 @ 10AM. Confirmed that patient has not taken dose today.   Goal(s) of therapy: Heparin  level 0.3 - 0.7 units/mL aPTT 66 - 102 seconds Monitor platelets by anticoagulation protocol: Yes   Baseline anticoagulation labs: Recent Labs    10/01/24 1600 10/01/24 2100 10/02/24 0214 10/02/24 0900  APTT  --  88* 59* 82*  INR  --  1.2  --   --  HGB 12.5* 12.2*  --   --   PLT 442* 374  --   --    1130 0214 aPTT 59, subtherapeutic  1130 0900 aPTT 82, HL 0.37, therapeutic x1 and correlating  PLAN: 1130 0900 aPTT 82, HL 0.37, therapeutic x1 and correlating continue heparin  infusion at 1300 units/hour. Check confirmatory HL in 6 hrs Continue to monitor CBC daily while on heparin  infusion.   Allean Haas PharmD Clinical Pharmacist 10/02/2024

## 2024-10-02 NOTE — Consult Note (Signed)
 Pharmacy Consult Note - Anticoagulation  Pharmacy Consult for Heparin  drip  Indication: atrial fibrillation Allergies  Allergen Reactions   Aspirin     PATIENT MEASUREMENTS: Height: 5' 6 (167.6 cm) Weight: 81.6 kg (180 lb) IBW/kg (Calculated) : 63.8 HEPARIN  DW (KG): 80.3  VITAL SIGNS: Temp: 98.1 F (36.7 C) (11/30 0002) Temp Source: Oral (11/29 2012) BP: 140/79 (11/30 0130) Pulse Rate: 72 (11/30 0130)  Recent Labs    10/01/24 1600 10/01/24 1600 10/01/24 2100 10/02/24 0214  HGB 12.5*  --  12.2*  --   HCT 38.9*  --  38.8*  --   PLT 442*  --  374  --   APTT  --    < > 88* 59*  LABPROT  --   --  16.4*  --   INR  --   --  1.2  --   HEPARINUNFRC  --   --  0.43  --   CREATININE 0.70  --   --   --    < > = values in this interval not displayed.    Estimated Creatinine Clearance: 89.9 mL/min (by C-G formula based on SCr of 0.7 mg/dL).  PAST MEDICAL HISTORY: Past Medical History:  Diagnosis Date   Acute bronchitis 05/07/2009   Qualifier: Diagnosis of   By: Antonetta MD, Margaret         Acute on chronic hypoxic respiratory failure (HCC) 10/06/2020   Acute on chronic respiratory failure with hypercapnia (HCC) 12/06/2019   Acute on chronic respiratory failure with hypoxia and hypercapnia (HCC) 07/28/2024   Acute respiratory disease due to COVID-19 virus 12/06/2019   Asthma    COPD (chronic obstructive pulmonary disease) (HCC)    COPD with acute exacerbation (HCC) 10/29/2019   Depression    GERD (gastroesophageal reflux disease)    Inferior ST segment elevation 07/28/2024   Sinus tachycardia 07/30/2024   Vision loss, left eye     Medications:  (Not in a hospital admission)  Scheduled:   amiodarone   200 mg Oral Daily   atorvastatin   80 mg Oral Daily   brimonidine   1 drop Left Eye BID   brinzolamide   1 drop Left Eye BID   ipratropium-albuterol   3 mL Nebulization Q6H WA   montelukast   10 mg Oral QHS   pantoprazole   20 mg Oral Daily   polyethylene glycol  17 g  Oral BID   predniSONE   40 mg Oral Q breakfast   tamsulosin   0.4 mg Oral Daily   Infusions:   cefTRIAXone  (ROCEPHIN )  IV     diltiazem  (CARDIZEM ) infusion 10 mg/hr (10/02/24 0054)   doxycycline (VIBRAMYCIN) IV     heparin  1,150 Units/hr (10/01/24 2007)   PRN: acetaminophen , albuterol , ondansetron  (ZOFRAN ) IV  ASSESSMENT: 68 y.o. male with PMH a flutter/A-fib on Eliquis , history of COPD  is presenting with SOB.  Pharmacy has been consulted to initiate and manage heparin  intravenous infusion. Last dose of Eliquis  was 11/28 @ 10AM. Confirmed that patient has not taken dose today.   Goal(s) of therapy: Heparin  level 0.3 - 0.7 units/mL aPTT 66 - 102 seconds Monitor platelets by anticoagulation protocol: Yes   Baseline anticoagulation labs: Recent Labs    10/01/24 1600 10/01/24 2100 10/02/24 0214  APTT  --  88* 59*  INR  --  1.2  --   HGB 12.5* 12.2*  --   PLT 442* 374  --    1130 0214 aPTT 59, subtherapeutic   PLAN: Give 1200 units bolus x 1 Increase  heparin  infusion to 1300 units/hour. Recheck aPTT in 6 hours after rate change Continue to titrate by aPTT until heparin  level and aPTT correlate, then titrate by heparin  level alone. Check heparin  level with next AM labs. Continue to monitor CBC daily while on heparin  infusion.   Rankin CANDIE Dills, PharmD, MBA 10/02/2024 2:50 AM

## 2024-10-02 NOTE — ED Notes (Signed)
 Called pharmacy to send medication.  Charge RN attempting third IV for medication administration management.

## 2024-10-02 NOTE — Consult Note (Signed)
 Cardiology Consultation   Patient ID: Bryan Kelley MRN: [id number]; DOB: February 16, 1956  Admit date: 10/01/2024 Date of Consult: 10/02/2024  PCP:  [employer]   [facility] Providers Cardiologist:  Alm Clay, MD  Electrophysiology APP:  Riddle, Suzann, NP     Patient Profile: Bryan Kelley is a 68 y.o. male with a hx of COPD on 3L O2, GERD, nonobstructive CAD, Afib/flutter who is being seen 10/02/2024 for the evaluation of Aflutter at the request of Dr. Marsa.  History of Present Illness: Bryan Kelley was admitted late September 2025 with COPD exacerbation, chest pain s/p emergent cath, new Aflutter with RVR, acute on chronic hypoxic respiratory failure. Patient converted with IV dilttiazem and switched to oral cardizem  and Eliquis .  LHC showed diffuse moderate CAD. Echo showed LVEF 65-70%, no WMA, G1DD.   The patient was hospitalized October 2025 for aflutter requiring TEE cardioversion, restored to NSR.  Presented back to the ER 09/15/24 with worsening SOB and tachycardia found to be back in Aflutter with rapid heart rate started on IV dilt. Due to low BP and persistent tachycardia, he was placed on IV amiodarone . He underwent successful DCCV. He was discharged on 09/17/24.  The patient presented back to the ER 10/01/24 with progressive SOB, cough and chills. No chest pain reported. He did not upper abdomen pain on the left side. He reports compliance with medications.  EMS was called, they noted the patient to be satting in the 70s on 3L O2. He was given IV solumedrol, duoneb and albuterol  and he was tachcardic to the 150s. He was brought to the ER for further evaluation.  In the ER labs showed WBC 8.1, Hgb 12.5, plt 442, HS trop 26>22. EKG showed aflutter HR 150s. CXR showed left lower lobe PNA. Respiratory panel negative. He was started on IV dilt, antibiotics and admitted.    Past Medical History:  Diagnosis Date   Acute bronchitis 05/07/2009    Qualifier: Diagnosis of   By: Antonetta MD, Margaret         Acute on chronic hypoxic respiratory failure (HCC) 10/06/2020   Acute on chronic respiratory failure with hypercapnia (HCC) 12/06/2019   Acute on chronic respiratory failure with hypoxia and hypercapnia (HCC) 07/28/2024   Acute respiratory disease due to COVID-19 virus 12/06/2019   Asthma    COPD (chronic obstructive pulmonary disease) (HCC)    COPD with acute exacerbation (HCC) 10/29/2019   Depression    GERD (gastroesophageal reflux disease)    Inferior ST segment elevation 07/28/2024   Sinus tachycardia 07/30/2024   Vision loss, left eye     Past Surgical History:  Procedure Laterality Date   CARDIOVERSION N/A 08/16/2024   Procedure: CARDIOVERSION;  Surgeon: Darliss Rogue, MD;  Location: [facility] ORS;  Service: Cardiovascular;  Laterality: N/A;   CARDIOVERSION N/A 09/16/2024   Procedure: CARDIOVERSION;  Surgeon: Argentina Clap, MD;  Location: [facility] ORS;  Service: Cardiovascular;  Laterality: N/A;   EYE SURGERY  2009, about [phone],8010   steel in left eye on the job, legally blind    LEFT HEART CATH AND CORONARY ANGIOGRAPHY N/A 07/28/2024   Procedure: LEFT HEART CATH AND CORONARY ANGIOGRAPHY;  Surgeon: Clay Alm ORN, MD;  Location: [facility] INVASIVE CV LAB;  Service: Cardiovascular;  Laterality: N/A;   TEE WITHOUT CARDIOVERSION N/A 08/16/2024   Procedure: ECHOCARDIOGRAM, TRANSESOPHAGEAL;  Surgeon: Darliss Rogue, MD;  Location: [facility] ORS;  Service: Cardiovascular;  Laterality: N/A;     Home Medications:  Prior to Admission medications  Medication Sig Start Date End Date Taking? Authorizing Provider  albuterol  (VENTOLIN  HFA) 108 (90 Base) MCG/ACT inhaler Inhale 2 puffs into the lungs every 6 (six) hours as needed for wheezing or shortness of breath. 11/30/19  Yes Edelmiro, Washington, MD  amiodarone  (PACERONE ) 200 MG tablet Take 2 tablets (400 mg total) by mouth 2 (two) times daily for 6 days, THEN 1 tablet (200 mg total) 2  (two) times daily for 7 days, THEN 1 tablet (200 mg total) daily for 7 days. 09/17/24 10/07/24 Yes Alexander, Natalie, DO  apixaban  (ELIQUIS ) 5 MG TABS tablet Take 1 tablet (5 mg total) by mouth 2 (two) times daily. 08/02/24  Yes Wieting, Richard, MD  atorvastatin  (LIPITOR ) 80 MG tablet Take 1 tablet (80 mg total) by mouth daily. 08/02/24  Yes Wieting, Richard, MD  brimonidine  (ALPHAGAN ) 0.2 % ophthalmic solution Place 1 drop into the left eye 2 (two) times daily. 04/05/24  Yes [provider]  diltiazem  (CARDIZEM  CD) 120 MG 24 hr capsule Take 1 capsule (120 mg total) by mouth daily. 09/17/24  Yes Alexander, Natalie, DO  ipratropium-albuterol  (DUONEB) 0.5-2.5 (3) MG/3ML SOLN Take 3 mLs by nebulization 4 (four) times daily as needed. 06/03/24  Yes [provider]  polyethylene glycol (MIRALAX  / GLYCOLAX ) 17 g packet Take 17 g by mouth 2 (two) times daily. 08/02/24  Yes Wieting, Richard, MD  sildenafil (VIAGRA) 100 MG tablet Take 100 mg by mouth as needed for erectile dysfunction. 07/26/24  Yes [provider]  tamsulosin  (FLOMAX ) 0.4 MG CAPS capsule Take 1 capsule (0.4 mg total) by mouth daily. 07/05/23  Yes Danton Reyes DASEN, MD  acetaminophen  (TYLENOL ) 325 MG tablet Take 2 tablets (650 mg total) by mouth every 6 (six) hours as needed for mild pain, fever or headache. Patient not taking: Reported on 09/15/2024 07/05/23   Danton Reyes DASEN, MD  brinzolamide  (AZOPT ) 1 % ophthalmic suspension Place 1 drop into the left eye 2 (two) times daily. Patient not taking: Reported on 10/01/2024 04/04/24   [provider]  budesonide -formoterol  (SYMBICORT ) 80-4.5 MCG/ACT inhaler Inhale 2 puffs into the lungs 2 (two) times daily. Patient not taking: Reported on 09/15/2024 08/16/24   Wouk, Devaughn Sayres, MD  lidocaine  (LIDODERM ) 5 % Place 1 patch onto the skin every 12 (twelve) hours. Remove & Discard patch within 12 hours or as directed by MD.  Ginnie the patch off for 12 hours before applying  a new one. Patient not taking: Reported on 09/15/2024 08/29/24 08/29/25  Gordan Huxley, MD  montelukast  (SINGULAIR ) 10 MG tablet Take 1 tablet (10 mg total) by mouth at bedtime. 08/02/24 09/01/24  Josette Ade, MD  pantoprazole  (PROTONIX ) 20 MG tablet Take 1 tablet (20 mg total) by mouth daily. 08/02/24 09/01/24  Josette Ade, MD    Scheduled Meds:  amiodarone   200 mg Oral Daily   atorvastatin   80 mg Oral Daily   brimonidine   1 drop Left Eye BID   brinzolamide   1 drop Left Eye BID   ipratropium-albuterol   3 mL Nebulization Q6H WA   montelukast   10 mg Oral QHS   pantoprazole   20 mg Oral Daily   polyethylene glycol  17 g Oral BID   predniSONE   40 mg Oral Q breakfast   tamsulosin   0.4 mg Oral Daily   Continuous Infusions:  cefTRIAXone  (ROCEPHIN )  IV     diltiazem  (CARDIZEM ) infusion 7.5 mg/hr (10/02/24 0534)   doxycycline (VIBRAMYCIN) IV Stopped (10/02/24 0754)   heparin  1,300 Units/hr (10/02/24 0304)  PRN Meds: acetaminophen , albuterol , ondansetron  (ZOFRAN ) IV  Allergies:    Allergies  Allergen Reactions   Aspirin     Social History:   Social History   Socioeconomic History   Marital status: Legally Separated    Spouse name: Not on file   Number of children: Not on file   Years of education: Not on file   Highest education level: Not on file  Occupational History   Not on file  Tobacco Use   Smoking status: Former    Current packs/day: 0.00    Average packs/day: 0.5 packs/day for 49.0 years (24.5 ttl pk-yrs)    Types: Cigarettes    Start date: 03/1973    Quit date: 03/2022    Years since quitting: 2.5   Smokeless tobacco: Never  Vaping Use   Vaping status: Never Used  Substance and Sexual Activity   Alcohol  use: Yes    Alcohol /week: 2.0 standard drinks of alcohol     Types: 2 Cans of beer per week   Drug use: No   Sexual activity: Not on file  Other Topics Concern   Not on file  Social History Narrative   Not on file   Social Drivers of Health    Financial Resource Strain: Not on file  Food Insecurity: Patient Declined (08/11/2024)   Hunger Vital Sign    Worried About Running Out of Food in the Last Year: Patient declined    Ran Out of Food in the Last Year: Patient declined  Transportation Needs: Patient Declined (08/11/2024)   PRAPARE - Administrator, Civil Service (Medical): Patient declined    Lack of Transportation (Non-Medical): Patient declined  Physical Activity: Not on file  Stress: Not on file  Social Connections: Patient Declined (08/11/2024)   Social Connection and Isolation Panel    Frequency of Communication with Friends and Family: Patient declined    Frequency of Social Gatherings with Friends and Family: Patient declined    Attends Religious Services: Patient declined    Database Administrator or Organizations: Patient declined    Attends Banker Meetings: Patient declined    Marital Status: Patient declined  Intimate Partner Violence: Patient Declined (08/11/2024)   Humiliation, Afraid, Rape, and Kick questionnaire    Fear of Current or Ex-Partner: Patient declined    Emotionally Abused: Patient declined    Physically Abused: Patient declined    Sexually Abused: Patient declined    Family History:    Family History  Problem Relation Age of Onset   Diabetes Mother    Hypertension Mother    Stroke Mother    Asthma Father    Diabetes Father    Diabetes Sister    Hypertension Sister    Diabetes Brother    Hypertension Brother      ROS:  Please see the history of present illness.   All other ROS reviewed and negative.     Physical Exam/Data: Vitals:   10/02/24 0530 10/02/24 0700 10/02/24 0730 10/02/24 0738  BP: (!) 141/74 137/77 136/79   Pulse: 71 76 77   Resp: 19 (!) 24 (!) 22   Temp:      TempSrc:      SpO2:   99% 99%  Weight:      Height:        Intake/Output Summary (Last 24 hours) at 10/02/2024 0855 Last data filed at 10/02/2024 0754 Gross per 24 hour   Intake 708.85 ml  Output 550 ml  Net 158.85  ml      10/01/2024    3:57 PM 09/16/2024    7:20 AM 09/15/2024   12:50 PM  Last 3 Weights  Weight (lbs) 180 lb 169 lb 15.6 oz 170 lb  Weight (kg) 81.647 kg 77.1 kg 77.111 kg     Body mass index is 29.05 kg/m.  General:  Well nourished, well developed, in no acute distress HEENT: normal Neck: no JVD Vascular: No carotid bruits; Distal pulses 2+ bilaterally Cardiac:  normal S1, S2; RRR; no murmur  Lungs:  diffuse wheezing Abd: soft, nontender, no hepatomegaly  Ext: no edema Musculoskeletal:  No deformities, BUE and BLE strength normal and equal Skin: warm and dry  Neuro:  CNs 2-12 intact, no focal abnormalities noted Psych:  Normal affect   EKG:  The EKG was personally reviewed and demonstrates:  Aflutter 150sbpm Telemetry:  Telemetry was personally reviewed and demonstrates:  Aflutter>NSR 11/29 around 8pm  Relevant CV Studies:  Echo TEE 08/16/24 1. Left ventricular ejection fraction, by estimation, is 60 to 65%. The  left ventricle has normal function.   2. No left atrial/left atrial appendage thrombus was detected. The LAA  emptying velocity was 40 cm/s.   Conclusion(s)/Recommendation(s): Normal biventricular function without  evidence of hemodynamically significant valvular heart disease. No LA/LAA  thrombus identified. Successful cardioversion performed with restoration  of normal sinus rhythm.   Echo 07/2024 1. Left ventricular ejection fraction, by estimation, is 65 to 70%. The  left ventricle has normal function. The left ventricle has no regional  wall motion abnormalities. Left ventricular diastolic parameters are  consistent with Grade I diastolic  dysfunction (impaired relaxation).   2. Right ventricular systolic function is normal. The right ventricular  size is normal.   3. The mitral valve is normal in structure. No evidence of mitral valve  regurgitation.   4. The aortic valve was not well visualized.  Aortic valve regurgitation  is not visualized.   5. The inferior vena cava is normal in size with greater than 50%  respiratory variability, suggesting right atrial pressure of 3 mmHg.    LHC 07/2024  Mid LAD lesion is 50% stenosed with 90% stenosed side branch in 2nd Diag.   Ost RCA to Prox RCA lesion is 30% stenosed. Prox RCA lesion is 30% stenosed. Prox RCA to Mid RCA lesion is 25% stenosed.   There is hyperdynamic left ventricular systolic function.  The left ventricular ejection fraction is greater than 65% by visual estimate. LV end diastolic pressure is severely elevated.   A-fib/flutter with RVR developed upon completion of procedure => flutter/fib waves noted with IV adenosine  12 mg => rate improved with 10 mg IV diltiazem    Dominance: Right      FINDINGS Diffuse moderate CAD: The most significant lesion being 90% stenosis of a tiny second diagonal branch associated with segmental eccentric 50% calcified LAD stenosis. Not flow-limiting.  LCx has 2 major OM branches and a small AV groove branch with this with small PL-no notable disease. RCA has tandem 30% proximal lesions and a 20% proximal to mid lesion with TIMI-3 flow down relatively small PDA with trivial posterolateral branches. Hyperdynamic LV significant ectopy with catheter insertion. EF estimated over 70%, and severely elevated LVEDP of 30 millimercury. Tachyarrhythmia originally suspected to be SVT but with 12 mg IV adenosine , this confirmed the presence of flutter/fib waves. He was then given 10 mg IV diltiazem  with improved heart rate into the 110s.  => Likely A-fib RVR Suspect AECOPD exacerbation exacerbated  by tachyarrhythmia.  This could explain his chest pain.  He had notable chest pain when tachycardic that improved with rate control.     RECOMMENDATIONS Admit to ICU under [facility] service-[name], MD with [facility] and PCCM consultation Check 2D echocardiogram the morning and cycle troponin levels    He will be started on diltiazem  drip after 10 mg IV bolus-titrate for rate control and then convert to p.o. I have stopped his home amlodipine  and started losartan  50 mg daily; would not start aspirin given the plans for DOAC. Reassess volume status in the morning, but he has already had 400 mL out after 40 mg IV Lasix  PCCM to assist TRH with management of a AECOPD Started high-dose statin based on diffuse CAD.   Recommend to resume Rivaroxaban, at currently prescribed dose and frequency.   Patient will be started on IV heparin  starting 2 hours after TR band removal and depending on timing/duration of his arrhythmia, could consider DOAC on discharge.  I think for ease of use, Xarelto may be a better option.      Laboratory Data: High Sensitivity Troponin:  No results for input(s): TROPONINIHS in the last 720 hours.   Chemistry Recent Labs  Lab 10/01/24 1600  NA 138  K 4.9  CL 99  CO2 30  GLUCOSE 110*  BUN 12  CREATININE 0.70  CALCIUM  9.0  GFRNONAA >60  ANIONGAP 10    No results for input(s): PROT, ALBUMIN, AST, ALT, ALKPHOS, BILITOT in the last 168 hours. Lipids No results for input(s): CHOL, TRIG, HDL, LABVLDL, LDLCALC, CHOLHDL in the last 168 hours.  Hematology Recent Labs  Lab 10/01/24 1600 10/01/24 2100  WBC 9.1 8.1  RBC 4.41 4.35  HGB 12.5* 12.2*  HCT 38.9* 38.8*  MCV 88.2 89.2  MCH 28.3 28.0  MCHC 32.1 31.4  RDW 14.0 13.6  PLT 442* 374   Thyroid  No results for input(s): TSH, FREET4 in the last 168 hours.  BNPNo results for input(s): BNP, PROBNP in the last 168 hours.  DDimer No results for input(s): DDIMER in the last 168 hours.  Radiology/Studies:  DG Chest 1 View Result Date: 10/01/2024 CLINICAL DATA:  Short of breath. EXAM: CHEST  1 VIEW COMPARISON:  09/15/2024 and older studies.  CT, 08/10/2024. FINDINGS: Cardiac silhouette is normal in size. No mediastinal or hilar masses. Left lung base opacity appears increased from the  most recent prior chest radiograph. Remainder of the lungs is clear. No pneumothorax. Skeletal structures are grossly intact. IMPRESSION: 1. Left lung base opacity appears increased from the most recent prior study. Prior study lateral view demonstrated a small associated pleural effusion. Findings consistent with left lower lobe pneumonia in the proper clinical setting. Electronically Signed   By: Alm Parkins M.D.   On: 10/01/2024 16:42     Assessment and Plan:  Aflutter RVR - s/p DCCV x 2, most recent 09/16/24 - presented back to the ER with SOB, chills, cough and tachycardia, found to be back in rapid aflutter in the setting of COPD exacerbation and PNA started on IV dilt - patient converted to NSR last night ~ 8PM, he remains in NSR - PTA amiodarone  200mg  daily - Eliquis  5mg  BID>IV heparin  in case of need for procedures. Can switch back to Eliquis  - wean IV dilt and restart PTA dilt 120mg  daily  COPD exacerbation PNA Chronic hypoxic respiratory failure on 3L O2 baseline - abx, steroids and duonebs per IM  CAD - HS trop 26>22 - Nonobstructive  CAD seen on cath 07/2024 - Lipitor  80mg  daily - No ASA given Eliquis  - suspect supply demand mismatch  HTN - PTA dilt held for IV dilt   For questions or updates, please contact [facility] Please consult [email or web address] for contact info under      Signed, Atilla Zollner VEAR Fishman, PA-C  10/02/2024 8:55 AM

## 2024-10-02 NOTE — ED Notes (Signed)
 Advised nurse that patient has ready bed

## 2024-10-02 NOTE — Progress Notes (Addendum)
 PROGRESS NOTE    Bryan Kelley   FMW:[id number] DOB: [birth year]  DOA: 10/01/2024 Date of Service: 10/02/24 which is hospital day 1  PCP: [facility], [facility] course / significant events:   HPI: Bryan Kelley is a 68 y.o. male with history of a flutter/A-fib on Eliquis , history of COPD on 3 L nasal cannula at baseline, history of GERD, presenting with shortness of breath and cough.  States new productive cough with white sputum 2 days ago.  Has been having increasing shortness of breath at the time.  States he does not have any chest pain right now, only notes some left-sided chest discomfort when taking deep breath.  Has been compliant with all his medications including his amiodarone  and Eliquis .  Denies any fever.  No leg swelling.     11/29: Came to ED - pneumonia and atrial fibrillation RVR which required initiation of diltiazem  infusion. CXR concerning for possible pneumonia, on personal review there is no lobar consolidate. ED also gave ceftriaxone  + doxycycline. Admitted to hospitalist continuing dilt gtt and abx 11/30: converted to NSR overnight. Cardiology consult --> cont amiodarone , can switch back to eliquis , wean IV dilt and restart po, no procedures planned at this time. Continue abx      Consultants:  Cardiology  Procedures/Surgeries: none      ASSESSMENT & PLAN:   Atrial fibrillation/flutter with RVR Likely d/t PNA/COPD exacerbation Treat underlying respiratory illness(es) as below Cardiology following  Dilt IV --> po home dose Hepartin IV --> eliquis  home dose Continue home dose oral amiodarone  No further intervention per cardiology    Coronary artery disease, nonobstructive cardiac catheterization 2 months ago showed mild-moderate, nonobstructive disease other than a relatively small diagonal that had significant ostial stenosis (not amenable to intervention.   Eliquis  instead of ASA high intensity statin      COPD (chronic  obstructive pulmonary disease) exacerbation due to Pneumonia Steroids Bronchodilators  Expectorant Tx pneumonia as below    Pneumonia, concern w/ recent hospitalization but symptoms developed >72h post discharge Neg COVID/flu/RSV CXR on personal review is not showing lobar consolidate, this may be COPD alone but given risk w/ recent admission will treat  MRSA coverage given recent hospitalization. Can anticipate deescalate if MRSA screen neg.  Ceftriaxone  + doxycycline (avoid  azithro w/ long qt) Legionella, strep pneumo, resp PCR, MRSA screen   Chronic hypoxic respiratory failure on home O2 3L Bryan Kelley - currently on baseline O2 Continue [place] oxygen  3 L to titrate as needed   Essential hypertension Home diltiazem  120 mg p.o. daily resumed   GERD (gastroesophageal reflux disease) PPI    Hyperlipidemia atorvastatin  80 mg nightly     Overweight / class 1 obesity based on BMI: Body mass index is 29.05 kg/m.[name] Significantly low or high BMI is associated with higher medical risk.  Underweight - under 18  overweight - 25 to 29 obese - 30 or more Class 1 obesity: BMI of 30.0 to 34 Class 2 obesity: BMI of 35.0 to 39 Class 3 obesity: BMI of 40.0 to 49 Super Morbid Obesity: BMI 50-59 Super-super Morbid Obesity: BMI 60+ Healthy nutrition and physical activity advised as adjunct to other disease management and risk reduction treatments    DVT prophylaxis: eliquis   IV fluids: no continuous IV fluids  Nutrition: cardiac Central lines / other devices: none  Code Status: FULL CODE ACP documentation reviewed:  none on file in [facility]  TOC needs: TBD expect none Medical barriers to dispo:  24h off drip and if doing well can go . Expected medical readiness for discharge tomorrow.              Subjective / Brief ROS:  Patient reports feeling good today other than cough  Denies CP/SOB.  Pain controlled.  Denies new weakness.  Tolerating diet.  Reports no concerns w/  urination/defecation.   Family Communication: none at this time     Objective Findings:  Vitals:   10/02/24 1000 10/02/24 1030 10/02/24 1320 10/02/24 1527  BP: 134/75 133/63 (!) 144/76 (!) 143/72  Pulse: 74 70 71   Resp: 18 20 17    Temp:   97.8 F (36.6 C)   TempSrc:   Oral   SpO2:   99%   Weight:      Height:        Intake/Output Summary (Last 24 hours) at 10/02/2024 1615 Last data filed at 10/02/2024 1502 Gross per 24 hour  Intake 1169.31 ml  Output 550 ml  Net 619.31 ml   Filed Weights   10/01/24 1557  Weight: 81.6 kg    Examination:  Physical Exam Constitutional:      General: He is not in acute distress.    Appearance: He is not ill-appearing.  Cardiovascular:     Rate and Rhythm: Normal rate and regular rhythm.  Pulmonary:     Effort: Pulmonary effort is normal.     Breath sounds: Decreased breath sounds and wheezing present. No rhonchi or rales.  Musculoskeletal:     Right lower leg: No edema.     Left lower leg: No edema.  Neurological:     General: No focal deficit present.     Mental Status: He is alert and oriented to person, place, and time.  Psychiatric:        Mood and Affect: Mood normal.        Behavior: Behavior normal.          Scheduled Medications:   amiodarone   200 mg Oral Daily   apixaban   5 mg Oral BID   atorvastatin   80 mg Oral Daily   brimonidine   1 drop Left Eye BID   brinzolamide   1 drop Left Eye BID   diltiazem   180 mg Oral Daily   ipratropium-albuterol   3 mL Nebulization Q6H WA   montelukast   10 mg Oral QHS   pantoprazole   20 mg Oral Daily   polyethylene glycol  17 g Oral BID   predniSONE   40 mg Oral Q breakfast   tamsulosin   0.4 mg Oral Daily    Continuous Infusions:  cefTRIAXone  (ROCEPHIN )  IV     doxycycline (VIBRAMYCIN) IV Stopped (10/02/24 0754)    PRN Medications:  acetaminophen , albuterol , ondansetron  (ZOFRAN ) IV  Antimicrobials from admission:  Anti-infectives (From admission, onward)    Start      Dose/Rate Route Frequency Ordered Stop   10/02/24 1700  cefTRIAXone  (ROCEPHIN ) 2 g in sodium chloride  0.9 % 100 mL IVPB        2 g 200 mL/hr over 30 Minutes Intravenous Every 24 hours 10/01/24 1832 10/07/24 1659   10/02/24 0500  doxycycline (VIBRAMYCIN) 100 mg in sodium chloride  0.9 % 250 mL IVPB        100 mg 125 mL/hr over 120 Minutes Intravenous Every 12 hours 10/01/24 1908 10/06/24 1659   10/01/24 1845  azithromycin  (ZITHROMAX ) 500 mg in sodium chloride  0.9 % 250 mL IVPB  Status:  Discontinued        500 mg  250 mL/hr over 60 Minutes Intravenous Every 24 hours 10/01/24 1832 10/01/24 1906   10/01/24 1700  cefTRIAXone  (ROCEPHIN ) 1 g in sodium chloride  0.9 % 100 mL IVPB        1 g 200 mL/hr over 30 Minutes Intravenous  Once 10/01/24 1653 10/01/24 1754   10/01/24 1630  doxycycline  (VIBRAMYCIN ) 100 mg in sodium chloride  0.9 % 250 mL IVPB        100 mg 125 mL/hr over 120 Minutes Intravenous  Once 10/01/24 1605 10/01/24 1918           Data Reviewed:  I have personally reviewed the following...  CBC: Recent Labs  Lab 10/01/24 1600 10/01/24 2100  WBC 9.1 8.1  NEUTROABS 4.8  --   HGB 12.5* 12.2*  HCT 38.9* 38.8*  MCV 88.2 89.2  PLT 442* 374   Basic Metabolic Panel: Recent Labs  Lab 10/01/24 1600  NA 138  K 4.9  CL 99  CO2 30  GLUCOSE 110*  BUN 12  CREATININE 0.70  CALCIUM  9.0   GFR: Estimated Creatinine Clearance: 89.9 mL/min (by C-G formula based on SCr of 0.7 mg/dL). Liver Function Tests: No results for input(s): AST, ALT, ALKPHOS, BILITOT, PROT, ALBUMIN in the last 168 hours. No results for input(s): LIPASE, AMYLASE in the last 168 hours. No results for input(s): AMMONIA in the last 168 hours. Coagulation Profile: Recent Labs  Lab 10/01/24 2100  INR 1.2   Cardiac Enzymes: No results for input(s): CKTOTAL, CKMB, CKMBINDEX, TROPONINI in the last 168 hours. BNP (last 3 results) No results for input(s): PROBNP in the last 8760  hours. HbA1C: No results for input(s): HGBA1C in the last 72 hours. CBG: No results for input(s): GLUCAP in the last 168 hours. Lipid Profile: No results for input(s): CHOL, HDL, LDLCALC, TRIG, CHOLHDL, LDLDIRECT in the last 72 hours. Thyroid  Function Tests: No results for input(s): TSH, T4TOTAL, FREET4, T3FREE, THYROIDAB in the last 72 hours. Anemia Panel: No results for input(s): VITAMINB12, FOLATE, FERRITIN, TIBC, IRON, RETICCTPCT in the last 72 hours. Most Recent Urinalysis On File:  No results found for: COLORURINE, APPEARANCEUR, LABSPEC, PHURINE, GLUCOSEU, HGBUR, BILIRUBINUR, KETONESUR, PROTEINUR, UROBILINOGEN, NITRITE, LEUKOCYTESUR Sepsis Labs: @LABRCNTIP (procalcitonin:4,lacticidven:4) Microbiology: Recent Results (from the past 240 hours)  Resp panel by RT-PCR (RSV, Flu A&B, Covid) Anterior Nasal Swab     Status: None   Collection Time: 10/01/24  4:00 PM   Specimen: Anterior Nasal Swab  Result Value Ref Range Status   SARS Coronavirus 2 by RT PCR NEGATIVE NEGATIVE Final    Comment: (NOTE) SARS-CoV-2 target nucleic acids are NOT DETECTED.  The SARS-CoV-2 RNA is generally detectable in upper respiratory specimens during the acute phase of infection. The lowest concentration of SARS-CoV-2 viral copies this assay can detect is 138 copies/mL. A negative result does not preclude SARS-Cov-2 infection and should not be used as the sole basis for treatment or other patient management decisions. A negative result may occur with  improper specimen collection/handling, submission of specimen other than nasopharyngeal swab, presence of viral mutation(s) within the areas targeted by this assay, and inadequate number of viral copies(<138 copies/mL). A negative result must be combined with clinical observations, patient history, and epidemiological information. The expected result is Negative.  Fact Sheet for Patients:   bloggercourse.com  Fact Sheet for Healthcare Providers:  seriousbroker.it  This test is no t yet approved or cleared by the [place]  FDA and  has been authorized for detection and/or diagnosis of SARS-CoV-2 by FDA under an  Emergency Use Authorization (EUA). This EUA will remain  in effect (meaning this test can be used) for the duration of the COVID-19 declaration under Section 564(b)(1) of the Act, 21 U.S.C.section 360bbb-3(b)(1), unless the authorization is terminated  or revoked sooner.       Influenza A by PCR NEGATIVE NEGATIVE Final   Influenza B by PCR NEGATIVE NEGATIVE Final    Comment: (NOTE) The Xpert Xpress SARS-CoV-2/FLU/RSV plus assay is intended as an aid in the diagnosis of influenza from Nasopharyngeal swab specimens and should not be used as a sole basis for treatment. Nasal washings and aspirates are unacceptable for Xpert Xpress SARS-CoV-2/FLU/RSV testing.  Fact Sheet for Patients: bloggercourse.com  Fact Sheet for Healthcare Providers: seriousbroker.it  This test is not yet approved or cleared by the [place]  FDA and has been authorized for detection and/or diagnosis of SARS-CoV-2 by FDA under an Emergency Use Authorization (EUA). This EUA will remain in effect (meaning this test can be used) for the duration of the COVID-19 declaration under Section 564(b)(1) of the Act, 21 U.S.C. section 360bbb-3(b)(1), unless the authorization is terminated or revoked.     Resp Syncytial Virus by PCR NEGATIVE NEGATIVE Final    Comment: (NOTE) Fact Sheet for Patients: bloggercourse.com  Fact Sheet for Healthcare Providers: seriousbroker.it  This test is not yet approved or cleared by the [place]  FDA and has been authorized for detection and/or diagnosis of SARS-CoV-2 by FDA under an Emergency Use  Authorization (EUA). This EUA will remain in effect (meaning this test can be used) for the duration of the COVID-19 declaration under Section 564(b)(1) of the Act, 21 U.S.C. section 360bbb-3(b)(1), unless the authorization is terminated or revoked.  Performed at [facility], [address] [place], [place] [address]   MRSA Next Gen by PCR, Nasal     Status: None   Collection Time: 10/01/24  6:53 PM   Specimen: Nasal Mucosa; Nasal Swab  Result Value Ref Range Status   MRSA by PCR Next Gen NOT DETECTED NOT DETECTED Final    Comment: (NOTE) The GeneXpert MRSA Assay (FDA approved for NASAL specimens only), is one component of a comprehensive MRSA colonization surveillance program. It is not intended to diagnose MRSA infection nor to guide or monitor treatment for MRSA infections. Test performance is not FDA approved in patients less than 7 years old. Performed at [facility], [address] [place], [place] [address]       Radiology Studies last 3 days: DG Chest 1 View Result Date: 10/01/2024 CLINICAL DATA:  Short of breath. EXAM: CHEST  1 VIEW COMPARISON:  09/15/2024 and older studies.  CT, 08/10/2024. FINDINGS: Cardiac silhouette is normal in size. No mediastinal or hilar masses. Left lung base opacity appears increased from the most recent prior chest radiograph. Remainder of the lungs is clear. No pneumothorax. Skeletal structures are grossly intact. IMPRESSION: 1. Left lung base opacity appears increased from the most recent prior study. Prior study lateral view demonstrated a small associated pleural effusion. Findings consistent with left lower lobe pneumonia in the proper clinical setting. Electronically Signed   By: Alm Parkins M.D.   On: 10/01/2024 16:42          Wesam Gearhart, DO [facility] 10/02/2024, 4:15 PM    Dictation software may have been used to generate the above note. Typos may occur and escape review in  typed/dictated notes. Please contact Dr Marsa directly for clarity if needed.  Staff may message me via secure  chat in Epic  but this may not receive an immediate response,  please page me for urgent matters!  If 7PM-7AM, please contact night coverage [email or web address]

## 2024-10-03 ENCOUNTER — Telehealth (HOSPITAL_COMMUNITY): Payer: Self-pay | Admitting: Pharmacy Technician

## 2024-10-03 ENCOUNTER — Other Ambulatory Visit (HOSPITAL_COMMUNITY): Payer: Self-pay

## 2024-10-03 DIAGNOSIS — I4891 Unspecified atrial fibrillation: Secondary | ICD-10-CM | POA: Diagnosis not present

## 2024-10-03 LAB — CBC
HCT: 37.6 % — ABNORMAL LOW (ref 39.0–52.0)
Hemoglobin: 11.7 g/dL — ABNORMAL LOW (ref 13.0–17.0)
MCH: 28.2 pg (ref 26.0–34.0)
MCHC: 31.1 g/dL (ref 30.0–36.0)
MCV: 90.6 fL (ref 80.0–100.0)
Platelets: 347 K/uL (ref 150–400)
RBC: 4.15 MIL/uL — ABNORMAL LOW (ref 4.22–5.81)
RDW: 14 % (ref 11.5–15.5)
WBC: 20.5 K/uL — ABNORMAL HIGH (ref 4.0–10.5)
nRBC: 0 % (ref 0.0–0.2)

## 2024-10-03 MED ORDER — DOXYCYCLINE HYCLATE 100 MG PO TABS
100.0000 mg | ORAL_TABLET | Freq: Two times a day (BID) | ORAL | Status: DC
  Administered 2024-10-03 – 2024-10-05 (×4): 100 mg via ORAL
  Filled 2024-10-03 (×4): qty 1

## 2024-10-03 MED ORDER — UMECLIDINIUM-VILANTEROL 62.5-25 MCG/ACT IN AEPB
1.0000 | INHALATION_SPRAY | Freq: Every day | RESPIRATORY_TRACT | Status: DC
  Administered 2024-10-03 – 2024-10-05 (×3): 1 via RESPIRATORY_TRACT
  Filled 2024-10-03: qty 14

## 2024-10-03 NOTE — Telephone Encounter (Signed)
 Patient Product/process development scientist completed.    The patient is insured through [facility]. Patient has Medicare and is not eligible for a copay card, but may be able to apply for patient assistance or Medicare RX Payment Plan (Patient Must reach out to their plan, if eligible for payment plan), if available.    Ran test claim for Eliquis 5 mg and the current 30 day co-pay is $0.00.   This test claim was processed through [facility]- copay amounts may vary at other pharmacies due to pharmacy/plan contracts, or as the patient moves through the different stages of their insurance plan.     Reyes Sharps, CPHT Pharmacy Technician Patient Advocate Specialist Lead [facility] Patient Advocate Team Direct Number: [phone]  Fax: [phone]

## 2024-10-03 NOTE — Progress Notes (Signed)
 PHARMACIST - PHYSICIAN COMMUNICATION DR:   Marsa CONCERNING: Antibiotic IV to Oral Route Change Policy  RECOMMENDATION: This patient is receiving Doxycycline by the intravenous route.  Based on criteria approved by the Pharmacy and Therapeutics Committee, the antibiotic(s) is/are being converted to the equivalent oral dose form(s).   DESCRIPTION: These criteria include: Patient being treated for a respiratory tract infection, urinary tract infection, cellulitis or clostridium difficile associated diarrhea if on metronidazole The patient is not neutropenic and does not exhibit a GI malabsorption state The patient is eating (either orally or via tube) and/or has been taking other orally administered medications for a least 24 hours The patient is improving clinically and has a Tmax < 100.5  Emanuelle Hammerstrom Rodriguez-Guzman PharmD, BCPS 10/03/2024 1:59 PM

## 2024-10-03 NOTE — Progress Notes (Signed)
 Progress Note  Patient Name: Bryan Kelley Date of Encounter: 10/03/2024  Primary Cardiologist: Anner  Subjective   Maintaining sinus rhythm. No chest pain. Dyspnea unchanged.   Inpatient Medications    Scheduled Meds:  amiodarone   200 mg Oral Daily   apixaban   5 mg Oral BID   atorvastatin   80 mg Oral Daily   brimonidine   1 drop Left Eye BID   brinzolamide   1 drop Left Eye BID   diltiazem   180 mg Oral Daily   ipratropium-albuterol   3 mL Nebulization Q6H WA   montelukast   10 mg Oral QHS   pantoprazole   20 mg Oral Daily   polyethylene glycol  17 g Oral BID   predniSONE   40 mg Oral Q breakfast   tamsulosin   0.4 mg Oral Daily   Continuous Infusions:  cefTRIAXone  (ROCEPHIN )  IV 2 g (10/02/24 1812)   doxycycline (VIBRAMYCIN) IV 100 mg (10/03/24 0440)   PRN Meds: acetaminophen , albuterol , lactulose , ondansetron  (ZOFRAN ) IV   Vital Signs    Vitals:   10/03/24 0014 10/03/24 0404 10/03/24 0700 10/03/24 0807  BP:  (!) 157/91  137/77  Pulse:  87  72  Resp:  19  18  Temp:  98.1 F (36.7 C)  97.6 F (36.4 C)  TempSrc:  Oral  Oral  SpO2: 99% 99% 99% 100%  Weight:      Height:        Intake/Output Summary (Last 24 hours) at 10/03/2024 0905 Last data filed at 10/02/2024 1916 Gross per 24 hour  Intake 957.29 ml  Output --  Net 957.29 ml   Filed Weights   10/01/24 1557  Weight: 81.6 kg    Telemetry    SR - Personally Reviewed  ECG    No new tracings - Personally Reviewed  Physical Exam   GEN: No acute distress.   Neck: No JVD. Cardiac: RRR, no murmurs, rubs, or gallops.  Respiratory: Diminished breath sounds and wheezing bilaterally.  GI: Soft, nontender, non-distended.   MS: No edema; No deformity. Neuro:  Alert and oriented x 3; Nonfocal.  Psych: Normal affect.  Labs    Chemistry Recent Labs  Lab 10/01/24 1600  NA 138  K 4.9  CL 99  CO2 30  GLUCOSE 110*  BUN 12  CREATININE 0.70  CALCIUM  9.0  GFRNONAA >60  ANIONGAP 10      Hematology Recent Labs  Lab 10/01/24 1600 10/01/24 2100 10/03/24 0425  WBC 9.1 8.1 20.5*  RBC 4.41 4.35 4.15*  HGB 12.5* 12.2* 11.7*  HCT 38.9* 38.8* 37.6*  MCV 88.2 89.2 90.6  MCH 28.3 28.0 28.2  MCHC 32.1 31.4 31.1  RDW 14.0 13.6 14.0  PLT 442* 374 347    Cardiac EnzymesNo results for input(s): TROPONINI in the last 168 hours. No results for input(s): TROPIPOC in the last 168 hours.   BNPNo results for input(s): BNP, PROBNP in the last 168 hours.   DDimer No results for input(s): DDIMER in the last 168 hours.   Radiology    DG Chest 1 View Result Date: 10/01/2024 IMPRESSION: 1. Left lung base opacity appears increased from the most recent prior study. Prior study lateral view demonstrated a small associated pleural effusion. Findings consistent with left lower lobe pneumonia in the proper clinical setting. Electronically Signed   By: Alm Parkins M.D.   On: 10/01/2024 16:42    Cardiac Studies   TEE/DCCV 08/16/2024: 1. Left ventricular ejection fraction, by estimation, is 60 to 65%. The  left ventricle has normal function.   2. No left atrial/left atrial appendage thrombus was detected. The LAA  emptying velocity was 40 cm/s.   Conclusion(s)/Recommendation(s): Normal biventricular function without  evidence of hemodynamically significant valvular heart disease. No LA/LAA  thrombus identified. Successful cardioversion performed with restoration  of normal sinus rhythm.  __________  2D echo 07/29/2024: 1. Left ventricular ejection fraction, by estimation, is 65 to 70%. The  left ventricle has normal function. The left ventricle has no regional  wall motion abnormalities. Left ventricular diastolic parameters are  consistent with Grade I diastolic  dysfunction (impaired relaxation).   2. Right ventricular systolic function is normal. The right ventricular  size is normal.   3. The mitral valve is normal in structure. No evidence of mitral valve   regurgitation.   4. The aortic valve was not well visualized. Aortic valve regurgitation  is not visualized.   5. The inferior vena cava is normal in size with greater than 50%  respiratory variability, suggesting right atrial pressure of 3 mmHg.  __________  LHC 07/28/2024:   Mid LAD lesion is 50% stenosed with 90% stenosed side branch in 2nd Diag.   Ost RCA to Prox RCA lesion is 30% stenosed. Prox RCA lesion is 30% stenosed. Prox RCA to Mid RCA lesion is 25% stenosed.   There is hyperdynamic left ventricular systolic function.  The left ventricular ejection fraction is greater than 65% by visual estimate. LV end diastolic pressure is severely elevated.   A-fib/flutter with RVR developed upon completion of procedure => flutter/fib waves noted with IV adenosine  12 mg => rate improved with 10 mg IV diltiazem    Dominance: Right      FINDINGS Diffuse moderate CAD: The most significant lesion being 90% stenosis of a tiny second diagonal branch associated with segmental eccentric 50% calcified LAD stenosis. Not flow-limiting.  LCx has 2 major OM branches and a small AV groove branch with this with small PL-no notable disease. RCA has tandem 30% proximal lesions and a 20% proximal to mid lesion with TIMI-3 flow down relatively small PDA with trivial posterolateral branches. Hyperdynamic LV significant ectopy with catheter insertion. EF estimated over 70%, and severely elevated LVEDP of 30 millimercury. Tachyarrhythmia originally suspected to be SVT but with 12 mg IV adenosine , this confirmed the presence of flutter/fib waves. He was then given 10 mg IV diltiazem  with improved heart rate into the 110s.  => Likely A-fib RVR Suspect AECOPD exacerbation exacerbated by tachyarrhythmia.  This could explain his chest pain.  He had notable chest pain when tachycardic that improved with rate control.     RECOMMENDATIONS Admit to ICU under TRH service-[name], MD with [facility] and PCCM  consultation Check 2D echocardiogram the morning and cycle troponin levels   He will be started on diltiazem  drip after 10 mg IV bolus-titrate for rate control and then convert to p.o. I have stopped his home amlodipine  and started losartan  50 mg daily; would not start aspirin given the plans for DOAC. Reassess volume status in the morning, but he has already had 400 mL out after 40 mg IV Lasix  PCCM to assist TRH with management of a AECOPD Started high-dose statin based on diffuse CAD.   Recommend to resume Rivaroxaban, at currently prescribed dose and frequency.   Patient will be started on IV heparin  starting 2 hours after TR band removal and depending on timing/duration of his arrhythmia, could consider DOAC on discharge.  I think for ease of use,  Xarelto may be a better option.  Patient Profile     68 y.o. male with history of nonobstructive CAD by LHC in 07/2024, afib/flutter s/p TEE/DCCV in 08/2024 and 09/2024, chronic hypoxic respiratory failure on supplemental oxygen  via nasal cannula at 3 L, COPD, HTN, normocytic anemia, and GERD who is admitted with acute on chronic hypoxic respiratory failure secondary to PNA and AECOPD and we are seeing for Afib/flutter with RVR and elevated troponin.   Assessment & Plan    1. Afib/flutter with RVR: - Maintaining sinus rhythm following spontaneous conversion earlier this admission  - Status post DCCV in 08/2024 and again in 09/2024 - Remains on PTA amiodarone  200 mg daily, may not be a great long-term medication given his age and underlying respiratory status  - PTA Cardizem  180 mg daily - CHADS2VASc at least 3 (HTN, age x 1, vascular disease) - Recommend outpatient EP evaluation for ongoing management   2. Acute on chronic hypoxic respiratory failure: - Secondary to PNA and AECOPD - Management per IM  3. Nonobstructive CAD with elevated troponin: - No chest pain - Recent LHC in 07/2024 with nonobstructive CAD - Mildly elevated and flat trending  high sensitivity troponin, not consistent with ACS and likely secondary to acute respiratory illness and atrial flutter with RVR - No plans for inpatient ischemic testing at this time - PTA Eliquis  in place of ASA - Lipitor  80 mg  4. HTN:  - Blood pressure well controlled - PTA Cardizem  as above  5. Normocytic anemia: - Stable      For questions or updates, please contact [facility] Please consult [email or web address] for contact info under Cardiology/STEMI.    Signed, Bernardino Bring, PA-C [facility] Pager: [phone] 10/03/2024, 9:05 AM

## 2024-10-03 NOTE — TOC CM/SW Note (Signed)
 Transition of Care [facility]) - Inpatient Brief Assessment   Patient Details  Name: Bryan Kelley MRN: [id number] Date of Birth: 05-17-1956  Transition of Care [facility]) CM/SW Contact:    Shasta DELENA Daring, RN Phone Number: 10/03/2024, 4:47 PM   Clinical Narrative: RNCM assessed patient. He lives in single family home with his sister. His sister provides transportation to appointments when available. Patient also uses transportation provided by his insurance company as needed. Sister will provide transp home at discharge. Local pharmacy is [facility] [address] in [place]. Patient does not have DME at home but does have nebulizer and O2.   Will follow for additional TOC needs and discharge readiness.   Transition of Care Asessment:

## 2024-10-03 NOTE — Progress Notes (Signed)
 PROGRESS NOTE    Bryan Kelley   FMW:[id number] DOB: 02-Jun-1956  DOA: 10/01/2024 Date of Service: 10/03/24 which is hospital day 2  PCP: [facility], [facility] course / significant events:   HPI: Bryan Kelley is a 68 y.o. male with history of a flutter/A-fib on Eliquis , history of COPD on 3 L nasal cannula at baseline, history of GERD, presenting with shortness of breath and cough.  States new productive cough with white sputum 2 days ago.  Has been having increasing shortness of breath at the time.  States he does not have any chest pain right now, only notes some left-sided chest discomfort when taking deep breath.  Has been compliant with all his medications including his amiodarone  and Eliquis .  Denies any fever.  No leg swelling.     11/29: Came to ED - pneumonia and atrial fibrillation RVR which required initiation of diltiazem  infusion. CXR concerning for possible pneumonia, on personal review there is no lobar consolidate. ED also gave ceftriaxone  + doxycycline. Admitted to hospitalist continuing dilt gtt and abx 11/30: converted to NSR overnight. Cardiology consult --> cont amiodarone , can switch back to eliquis , wean IV dilt and restart po, no procedures planned at this time. Continue abx  12/01: remains very SOB and wheezing, continue treatment for COPD frequent nebs.      Consultants:  Cardiology  Procedures/Surgeries: none      ASSESSMENT & PLAN:   COPD (chronic obstructive pulmonary disease) exacerbation due to Pneumonia Steroids Bronchodilators - LAMA/LABA starting anoro today Adding ICS given persistent wheezing, think COPD more so than pneumonia is driving the SOB Expectorant Tx pneumonia as below   Atrial fibrillation/flutter with RVR - resolved  Likely d/t PNA/COPD exacerbation Treat underlying respiratory illness(es) as below Cardiology following  Cardizem  180 mg daily (previous home dose was 160) Continue home dose oral  amiodarone , Eliquis  No further inpatient intervention per cardiology - plan EP follow up in 2 weeks outpatient  Cardiology s/o today, reengage as needed   Coronary artery disease, nonobstructive cardiac catheterization 2 months ago showed mild-moderate, nonobstructive disease other than a relatively small diagonal that had significant ostial stenosis (not amenable to intervention.   Eliquis  instead of ASA high intensity statin       Pneumonia, concern w/ recent hospitalization but symptoms developed >72h post discharge Neg COVID/flu/RSV CXR on personal review is not showing lobar consolidate, this may be COPD alone but given risk w/ recent admission will treat  Legionella, strep pneumo, MRSA not detected Ceftriaxone  + doxycycline (avoid  azithro w/ long qt) resp PCR pending   Chronic hypoxic respiratory failure on home O2 3L [place] - currently on baseline O2 Continue [place] oxygen  3 L to titrate as needed   Essential hypertension Home diltiazem  120 mg p.o. daily resumed   GERD (gastroesophageal reflux disease) PPI    Hyperlipidemia atorvastatin  80 mg nightly     Overweight / class 1 obesity based on BMI: Body mass index is 29.05 kg/m.[name] Significantly low or high BMI is associated with higher medical risk.  Underweight - under 18  overweight - 25 to 29 obese - 30 or more Class 1 obesity: BMI of 30.0 to 34 Class 2 obesity: BMI of 35.0 to 39 Class 3 obesity: BMI of 40.0 to 49 Super Morbid Obesity: BMI 50-59 Super-super Morbid Obesity: BMI 60+ Healthy nutrition and physical activity advised as adjunct to other disease management and risk reduction treatments    DVT prophylaxis: eliquis   IV  fluids: no continuous IV fluids  Nutrition: cardiac Central lines / other devices: none  Code Status: FULL CODE ACP documentation reviewed:  none on file in [facility]  TOC needs: TBD expect none Medical barriers to dispo: COPD . Expected medical readiness for discharge tomorrow.               Subjective / Brief ROS:  Patient reports SOB and cough  Denies CP/palpitations.  Pain controlled.  Denies new weakness.  Tolerating diet.  Reports no concerns w/ urination/defecation.   Family Communication: none at this time     Objective Findings:  Vitals:   10/03/24 0807 10/03/24 1226 10/03/24 1315 10/03/24 1443  BP: 137/77 130/76  134/73  Pulse: 72 73 78 72  Resp: 18 18  18   Temp: 97.6 F (36.4 C) 97.8 F (36.6 C)  98 F (36.7 C)  TempSrc: Oral Oral  Oral  SpO2: 100% 98% 98% 98%  Weight:      Height:        Intake/Output Summary (Last 24 hours) at 10/03/2024 1727 Last data filed at 10/03/2024 0915 Gross per 24 hour  Intake 960 ml  Output --  Net 960 ml   Filed Weights   10/01/24 1557  Weight: 81.6 kg    Examination:  Physical Exam Constitutional:      General: He is not in acute distress.    Appearance: He is not ill-appearing.  Cardiovascular:     Rate and Rhythm: Normal rate and regular rhythm.  Pulmonary:     Effort: Pulmonary effort is normal.     Breath sounds: Decreased breath sounds and wheezing present. No rhonchi or rales.  Musculoskeletal:     Right lower leg: No edema.     Left lower leg: No edema.  Neurological:     General: No focal deficit present.     Mental Status: He is alert and oriented to person, place, and time.  Psychiatric:        Mood and Affect: Mood normal.        Behavior: Behavior normal.          Scheduled Medications:   amiodarone   200 mg Oral Daily   apixaban   5 mg Oral BID   atorvastatin   80 mg Oral Daily   brimonidine   1 drop Left Eye BID   brinzolamide   1 drop Left Eye BID   diltiazem   180 mg Oral Daily   doxycycline  100 mg Oral Q12H   ipratropium-albuterol   3 mL Nebulization Q6H WA   montelukast   10 mg Oral QHS   pantoprazole   20 mg Oral Daily   polyethylene glycol  17 g Oral BID   predniSONE   40 mg Oral Q breakfast   tamsulosin   0.4 mg Oral Daily   umeclidinium-vilanterol   1 puff Inhalation Daily    Continuous Infusions:  cefTRIAXone  (ROCEPHIN )  IV 2 g (10/02/24 1812)    PRN Medications:  acetaminophen , albuterol , lactulose , ondansetron  (ZOFRAN ) IV  Antimicrobials from admission:  Anti-infectives (From admission, onward)    Start     Dose/Rate Route Frequency Ordered Stop   10/03/24 2000  doxycycline (VIBRA-TABS) tablet 100 mg        100 mg Oral Every 12 hours 10/03/24 1358 10/06/24 1959   10/02/24 1700  cefTRIAXone  (ROCEPHIN ) 2 g in sodium chloride  0.9 % 100 mL IVPB        2 g 200 mL/hr over 30 Minutes Intravenous Every 24 hours 10/01/24 1832 10/07/24 1659  10/02/24 0500  doxycycline (VIBRAMYCIN) 100 mg in sodium chloride  0.9 % 250 mL IVPB  Status:  Discontinued        100 mg 125 mL/hr over 120 Minutes Intravenous Every 12 hours 10/01/24 1908 10/03/24 1357   10/01/24 1845  azithromycin  (ZITHROMAX ) 500 mg in sodium chloride  0.9 % 250 mL IVPB  Status:  Discontinued        500 mg 250 mL/hr over 60 Minutes Intravenous Every 24 hours 10/01/24 1832 10/01/24 1906   10/01/24 1700  cefTRIAXone  (ROCEPHIN ) 1 g in sodium chloride  0.9 % 100 mL IVPB        1 g 200 mL/hr over 30 Minutes Intravenous  Once 10/01/24 1653 10/01/24 1754   10/01/24 1630  doxycycline (VIBRAMYCIN) 100 mg in sodium chloride  0.9 % 250 mL IVPB        100 mg 125 mL/hr over 120 Minutes Intravenous  Once 10/01/24 1605 10/01/24 1918           Data Reviewed:  I have personally reviewed the following...  CBC: Recent Labs  Lab 10/01/24 1600 10/01/24 2100 10/03/24 0425  WBC 9.1 8.1 20.5*  NEUTROABS 4.8  --   --   HGB 12.5* 12.2* 11.7*  HCT 38.9* 38.8* 37.6*  MCV 88.2 89.2 90.6  PLT 442* 374 347   Basic Metabolic Panel: Recent Labs  Lab 10/01/24 1600  NA 138  K 4.9  CL 99  CO2 30  GLUCOSE 110*  BUN 12  CREATININE 0.70  CALCIUM  9.0   GFR: Estimated Creatinine Clearance: 89.9 mL/min (by C-G formula based on SCr of 0.7 mg/dL). Liver Function Tests: No results for  input(s): AST, ALT, ALKPHOS, BILITOT, PROT, ALBUMIN in the last 168 hours. No results for input(s): LIPASE, AMYLASE in the last 168 hours. No results for input(s): AMMONIA in the last 168 hours. Coagulation Profile: Recent Labs  Lab 10/01/24 2100  INR 1.2   Cardiac Enzymes: No results for input(s): CKTOTAL, CKMB, CKMBINDEX, TROPONINI in the last 168 hours. BNP (last 3 results) No results for input(s): PROBNP in the last 8760 hours. HbA1C: No results for input(s): HGBA1C in the last 72 hours. CBG: No results for input(s): GLUCAP in the last 168 hours. Lipid Profile: No results for input(s): CHOL, HDL, LDLCALC, TRIG, CHOLHDL, LDLDIRECT in the last 72 hours. Thyroid  Function Tests: No results for input(s): TSH, T4TOTAL, FREET4, T3FREE, THYROIDAB in the last 72 hours. Anemia Panel: No results for input(s): VITAMINB12, FOLATE, FERRITIN, TIBC, IRON, RETICCTPCT in the last 72 hours. Most Recent Urinalysis On File:  No results found for: COLORURINE, APPEARANCEUR, LABSPEC, PHURINE, GLUCOSEU, HGBUR, BILIRUBINUR, KETONESUR, PROTEINUR, UROBILINOGEN, NITRITE, LEUKOCYTESUR Sepsis Labs: @LABRCNTIP (procalcitonin:4,lacticidven:4) Microbiology: Recent Results (from the past 240 hours)  Resp panel by RT-PCR (RSV, Flu A&B, Covid) Anterior Nasal Swab     Status: None   Collection Time: 10/01/24  4:00 PM   Specimen: Anterior Nasal Swab  Result Value Ref Range Status   SARS Coronavirus 2 by RT PCR NEGATIVE NEGATIVE Final    Comment: (NOTE) SARS-CoV-2 target nucleic acids are NOT DETECTED.  The SARS-CoV-2 RNA is generally detectable in upper respiratory specimens during the acute phase of infection. The lowest concentration of SARS-CoV-2 viral copies this assay can detect is 138 copies/mL. A negative result does not preclude SARS-Cov-2 infection and should not be used as the sole basis for treatment or other  patient management decisions. A negative result may occur with  improper specimen collection/handling, submission of specimen other than nasopharyngeal swab, presence  of viral mutation(s) within the areas targeted by this assay, and inadequate number of viral copies(<138 copies/mL). A negative result must be combined with clinical observations, patient history, and epidemiological information. The expected result is Negative.  Fact Sheet for Patients:  bloggercourse.com  Fact Sheet for Healthcare Providers:  seriousbroker.it  This test is no t yet approved or cleared by the [place]  FDA and  has been authorized for detection and/or diagnosis of SARS-CoV-2 by FDA under an Emergency Use Authorization (EUA). This EUA will remain  in effect (meaning this test can be used) for the duration of the COVID-19 declaration under Section 564(b)(1) of the Act, 21 U.S.C.section 360bbb-3(b)(1), unless the authorization is terminated  or revoked sooner.       Influenza A by PCR NEGATIVE NEGATIVE Final   Influenza B by PCR NEGATIVE NEGATIVE Final    Comment: (NOTE) The Xpert Xpress SARS-CoV-2/FLU/RSV plus assay is intended as an aid in the diagnosis of influenza from Nasopharyngeal swab specimens and should not be used as a sole basis for treatment. Nasal washings and aspirates are unacceptable for Xpert Xpress SARS-CoV-2/FLU/RSV testing.  Fact Sheet for Patients: bloggercourse.com  Fact Sheet for Healthcare Providers: seriousbroker.it  This test is not yet approved or cleared by the [place]  FDA and has been authorized for detection and/or diagnosis of SARS-CoV-2 by FDA under an Emergency Use Authorization (EUA). This EUA will remain in effect (meaning this test can be used) for the duration of the COVID-19 declaration under Section 564(b)(1) of the Act, 21 U.S.C. section  360bbb-3(b)(1), unless the authorization is terminated or revoked.     Resp Syncytial Virus by PCR NEGATIVE NEGATIVE Final    Comment: (NOTE) Fact Sheet for Patients: bloggercourse.com  Fact Sheet for Healthcare Providers: seriousbroker.it  This test is not yet approved or cleared by the [place]  FDA and has been authorized for detection and/or diagnosis of SARS-CoV-2 by FDA under an Emergency Use Authorization (EUA). This EUA will remain in effect (meaning this test can be used) for the duration of the COVID-19 declaration under Section 564(b)(1) of the Act, 21 U.S.C. section 360bbb-3(b)(1), unless the authorization is terminated or revoked.  Performed at [facility], [address] [place], [place] [address]   MRSA Next Gen by PCR, Nasal     Status: None   Collection Time: 10/01/24  6:53 PM   Specimen: Nasal Mucosa; Nasal Swab  Result Value Ref Range Status   MRSA by PCR Next Gen NOT DETECTED NOT DETECTED Final    Comment: (NOTE) The GeneXpert MRSA Assay (FDA approved for NASAL specimens only), is one component of a comprehensive MRSA colonization surveillance program. It is not intended to diagnose MRSA infection nor to guide or monitor treatment for MRSA infections. Test performance is not FDA approved in patients less than 11 years old. Performed at [facility], [address] [place], [place] [address]       Radiology Studies last 3 days: DG Chest 1 View Result Date: 10/01/2024 CLINICAL DATA:  Short of breath. EXAM: CHEST  1 VIEW COMPARISON:  09/15/2024 and older studies.  CT, 08/10/2024. FINDINGS: Cardiac silhouette is normal in size. No mediastinal or hilar masses. Left lung base opacity appears increased from the most recent prior chest radiograph. Remainder of the lungs is clear. No pneumothorax. Skeletal structures are grossly intact. IMPRESSION: 1. Left lung base opacity appears  increased from the most recent prior study. Prior study lateral view demonstrated a small associated pleural effusion. Findings  consistent with left lower lobe pneumonia in the proper clinical setting. Electronically Signed   By: Alm Parkins M.D.   On: 10/01/2024 16:42          Infant Zink, DO [facility] 10/03/2024, 5:27 PM    Dictation software may have been used to generate the above note. Typos may occur and escape review in typed/dictated notes. Please contact Dr Marsa directly for clarity if needed.  Staff may message me via secure chat in Epic  but this may not receive an immediate response,  please page me for urgent matters!  If 7PM-7AM, please contact night coverage [email or web address]

## 2024-10-03 NOTE — Plan of Care (Signed)
  Problem: Clinical Measurements: Goal: Ability to maintain clinical measurements within normal limits will improve Outcome: Progressing   Problem: Activity: Goal: Risk for activity intolerance will decrease Outcome: Progressing   Problem: Nutrition: Goal: Adequate nutrition will be maintained Outcome: Progressing   Problem: Elimination: Goal: Will not experience complications related to bowel motility Outcome: Progressing   Problem: Skin Integrity: Goal: Risk for impaired skin integrity will decrease Outcome: Progressing   Problem: Pain Managment: Goal: General experience of comfort will improve and/or be controlled Outcome: Progressing   Problem: Activity: Goal: Ability to tolerate increased activity will improve Outcome: Progressing   Problem: Clinical Measurements: Goal: Ability to maintain a body temperature in the normal range will improve Outcome: Progressing   Problem: Respiratory: Goal: Ability to maintain adequate ventilation will improve Outcome: Progressing Goal: Ability to maintain a clear airway will improve Outcome: Progressing

## 2024-10-04 ENCOUNTER — Ambulatory Visit: Admitting: Cardiology

## 2024-10-04 LAB — CBC
HCT: 33.9 % — ABNORMAL LOW (ref 39.0–52.0)
Hemoglobin: 10.7 g/dL — ABNORMAL LOW (ref 13.0–17.0)
MCH: 28 pg (ref 26.0–34.0)
MCHC: 31.6 g/dL (ref 30.0–36.0)
MCV: 88.7 fL (ref 80.0–100.0)
Platelets: 323 K/uL (ref 150–400)
RBC: 3.82 MIL/uL — ABNORMAL LOW (ref 4.22–5.81)
RDW: 14 % (ref 11.5–15.5)
WBC: 15 K/uL — ABNORMAL HIGH (ref 4.0–10.5)
nRBC: 0 % (ref 0.0–0.2)

## 2024-10-04 MED ORDER — BUDESONIDE 0.25 MG/2ML IN SUSP
0.2500 mg | Freq: Two times a day (BID) | RESPIRATORY_TRACT | Status: DC
  Administered 2024-10-04 – 2024-10-05 (×2): 0.25 mg via RESPIRATORY_TRACT
  Filled 2024-10-04 (×2): qty 2

## 2024-10-04 NOTE — Progress Notes (Signed)
 PROGRESS NOTE    Bryan Kelley   FMW:[id number] DOB: [birth year]  DOA: 10/01/2024 Date of Service: 10/04/24 which is hospital day 3  PCP: [facility] [place] [facility] course / significant events:   HPI: Bryan Kelley is a 68 y.o. male with history of a flutter/A-fib on Eliquis , history of COPD on 3 L nasal cannula at baseline, history of GERD, presenting with shortness of breath and cough.  States new productive cough with white sputum 2 days ago.  Has been having increasing shortness of breath at the time.  States he does not have any chest pain right now, only notes some left-sided chest discomfort when taking deep breath.  Has been compliant with all his medications including his amiodarone  and Eliquis .  Denies any fever.  No leg swelling.     11/29: Came to ED - pneumonia and atrial fibrillation RVR which required initiation of diltiazem  infusion. CXR concerning for possible pneumonia, on personal review there is no lobar consolidate. ED also gave ceftriaxone  + doxycycline. Admitted to hospitalist continuing dilt gtt and abx 11/30: converted to NSR overnight. Cardiology consult --> cont amiodarone , can switch back to eliquis , wean IV dilt and restart po, no procedures planned at this time. Continue abx  12/01-12/02: remains very SOB and wheezing, continue treatment for COPD frequent nebs. 12/02 sats down into 70s on 3-4 L O2 while walking     Consultants:  Cardiology  Procedures/Surgeries: none      ASSESSMENT & PLAN:   COPD (chronic obstructive pulmonary disease) exacerbation due to Pneumonia Steroids Bronchodilators - LAMA/LABA starting anoro  Adding ICS given persistent wheezing, think COPD more so than pneumonia is driving the SOB Expectorant Tx pneumonia as below   Atrial fibrillation/flutter with RVR - resolved  Likely d/t PNA/COPD exacerbation Treat underlying respiratory illness(es) as below Cardizem  180 mg daily (previous home dose was  160) Continue home dose oral amiodarone , Eliquis  No further inpatient intervention per cardiology - plan EP follow up in 2 weeks outpatient  Cardiology s/o, reengage as needed   Coronary artery disease, nonobstructive cardiac catheterization 2 months ago showed mild-moderate, nonobstructive disease other than a relatively small diagonal that had significant ostial stenosis (not amenable to intervention.   Eliquis  instead of ASA high intensity statin       Pneumonia, concern w/ recent hospitalization but symptoms developed >72h post discharge Neg COVID/flu/RSV CXR on personal review is not showing lobar consolidate, this may be COPD alone but given risk w/ recent admission will treat  Legionella, strep pneumo, MRSA not detected Ceftriaxone  + doxycycline (avoid  azithro w/ long qt) resp PCR still pending after several days may need recollect but will defer for now unless worse    Chronic hypoxic respiratory failure on home O2 3L [place] - currently on baseline O2 Continue [place] oxygen  3 L to titrate as needed   Essential hypertension Home diltiazem  120 mg p.o. daily resumed   GERD (gastroesophageal reflux disease) PPI    Hyperlipidemia atorvastatin  80 mg nightly     Overweight / class 1 obesity based on BMI: Body mass index is 29.05 kg/m.[name] Significantly low or high BMI is associated with higher medical risk.  Underweight - under 18  overweight - 25 to 29 obese - 30 or more Class 1 obesity: BMI of 30.0 to 34 Class 2 obesity: BMI of 35.0 to 39 Class 3 obesity: BMI of 40.0 to 49 Super Morbid Obesity: BMI 50-59 Super-super Morbid Obesity: BMI 60+ Healthy nutrition and  physical activity advised as adjunct to other disease management and risk reduction treatments    DVT prophylaxis: eliquis   IV fluids: no continuous IV fluids  Nutrition: cardiac Central lines / other devices: none  Code Status: FULL CODE ACP documentation reviewed:  none on file in [facility]  TOC needs: TBD expect  none Medical barriers to dispo: COPD . Expected medical readiness for discharge 1-2 days.              Subjective / Brief ROS:  Patient reports SOB and cough  Denies CP/palpitations.  Pain controlled.  Denies new weakness.  Tolerating diet.  Reports no concerns w/ urination/defecation.   Family Communication: none at this time     Objective Findings:  Vitals:   10/04/24 0355 10/04/24 0953 10/04/24 1306 10/04/24 1543  BP: (!) 151/77 (!) 162/89 (!) 146/88 (!) 154/84  Pulse: 71 74 81 65  Resp: 18 18 20 19   Temp: 97.9 F (36.6 C) 98.6 F (37 C) 98 F (36.7 C) 97.6 F (36.4 C)  TempSrc:  Oral Oral   SpO2: 97% 100% 98% 96%  Weight:      Height:        Intake/Output Summary (Last 24 hours) at 10/04/2024 1623 Last data filed at 10/04/2024 9642 Gross per 24 hour  Intake 100 ml  Output 250 ml  Net -150 ml   Filed Weights   10/01/24 1557  Weight: 81.6 kg    Examination:  Physical Exam Constitutional:      General: He is not in acute distress.    Appearance: He is not ill-appearing.  Cardiovascular:     Rate and Rhythm: Normal rate and regular rhythm.  Pulmonary:     Effort: Pulmonary effort is normal.     Breath sounds: Decreased breath sounds and wheezing present. No rhonchi or rales.  Musculoskeletal:     Right lower leg: No edema.     Left lower leg: No edema.  Neurological:     General: No focal deficit present.     Mental Status: He is alert and oriented to person, place, and time.  Psychiatric:        Mood and Affect: Mood normal.        Behavior: Behavior normal.          Scheduled Medications:   amiodarone   200 mg Oral Daily   apixaban   5 mg Oral BID   atorvastatin   80 mg Oral Daily   brimonidine   1 drop Left Eye BID   brinzolamide   1 drop Left Eye BID   budesonide  (PULMICORT ) nebulizer solution  0.25 mg Nebulization BID   diltiazem   180 mg Oral Daily   doxycycline  100 mg Oral Q12H   ipratropium-albuterol   3 mL Nebulization Q6H  WA   montelukast   10 mg Oral QHS   pantoprazole   20 mg Oral Daily   polyethylene glycol  17 g Oral BID   predniSONE   40 mg Oral Q breakfast   tamsulosin   0.4 mg Oral Daily   umeclidinium-vilanterol  1 puff Inhalation Daily    Continuous Infusions:  cefTRIAXone  (ROCEPHIN )  IV 2 g (10/03/24 1729)    PRN Medications:  acetaminophen , albuterol , lactulose , ondansetron  (ZOFRAN ) IV  Antimicrobials from admission:  Anti-infectives (From admission, onward)    Start     Dose/Rate Route Frequency Ordered Stop   10/03/24 2000  doxycycline (VIBRA-TABS) tablet 100 mg        100 mg Oral Every 12 hours 10/03/24 1358 10/06/24  1959   10/02/24 1700  cefTRIAXone  (ROCEPHIN ) 2 g in sodium chloride  0.9 % 100 mL IVPB        2 g 200 mL/hr over 30 Minutes Intravenous Every 24 hours 10/01/24 1832 10/07/24 1659   10/02/24 0500  doxycycline  (VIBRAMYCIN ) 100 mg in sodium chloride  0.9 % 250 mL IVPB  Status:  Discontinued        100 mg 125 mL/hr over 120 Minutes Intravenous Every 12 hours 10/01/24 1908 10/03/24 1357   10/01/24 1845  azithromycin  (ZITHROMAX ) 500 mg in sodium chloride  0.9 % 250 mL IVPB  Status:  Discontinued        500 mg 250 mL/hr over 60 Minutes Intravenous Every 24 hours 10/01/24 1832 10/01/24 1906   10/01/24 1700  cefTRIAXone  (ROCEPHIN ) 1 g in sodium chloride  0.9 % 100 mL IVPB        1 g 200 mL/hr over 30 Minutes Intravenous  Once 10/01/24 1653 10/01/24 1754   10/01/24 1630  doxycycline  (VIBRAMYCIN ) 100 mg in sodium chloride  0.9 % 250 mL IVPB        100 mg 125 mL/hr over 120 Minutes Intravenous  Once 10/01/24 1605 10/01/24 1918           Data Reviewed:  I have personally reviewed the following...  CBC: Recent Labs  Lab 10/01/24 1600 10/01/24 2100 10/03/24 0425 10/04/24 0500  WBC 9.1 8.1 20.5* 15.0*  NEUTROABS 4.8  --   --   --   HGB 12.5* 12.2* 11.7* 10.7*  HCT 38.9* 38.8* 37.6* 33.9*  MCV 88.2 89.2 90.6 88.7  PLT 442* 374 347 323   Basic Metabolic Panel: Recent Labs   Lab 10/01/24 1600  NA 138  K 4.9  CL 99  CO2 30  GLUCOSE 110*  BUN 12  CREATININE 0.70  CALCIUM  9.0   GFR: Estimated Creatinine Clearance: 89.9 mL/min (by C-G formula based on SCr of 0.7 mg/dL). Liver Function Tests: No results for input(s): AST, ALT, ALKPHOS, BILITOT, PROT, ALBUMIN in the last 168 hours. No results for input(s): LIPASE, AMYLASE in the last 168 hours. No results for input(s): AMMONIA in the last 168 hours. Coagulation Profile: Recent Labs  Lab 10/01/24 2100  INR 1.2   Cardiac Enzymes: No results for input(s): CKTOTAL, CKMB, CKMBINDEX, TROPONINI in the last 168 hours. BNP (last 3 results) No results for input(s): PROBNP in the last 8760 hours. HbA1C: No results for input(s): HGBA1C in the last 72 hours. CBG: No results for input(s): GLUCAP in the last 168 hours. Lipid Profile: No results for input(s): CHOL, HDL, LDLCALC, TRIG, CHOLHDL, LDLDIRECT in the last 72 hours. Thyroid  Function Tests: No results for input(s): TSH, T4TOTAL, FREET4, T3FREE, THYROIDAB in the last 72 hours. Anemia Panel: No results for input(s): VITAMINB12, FOLATE, FERRITIN, TIBC, IRON, RETICCTPCT in the last 72 hours. Most Recent Urinalysis On File:  No results found for: COLORURINE, APPEARANCEUR, LABSPEC, PHURINE, GLUCOSEU, HGBUR, BILIRUBINUR, KETONESUR, PROTEINUR, UROBILINOGEN, NITRITE, LEUKOCYTESUR Sepsis Labs: @LABRCNTIP (procalcitonin:4,lacticidven:4) Microbiology: Recent Results (from the past 240 hours)  Resp panel by RT-PCR (RSV, Flu A&B, Covid) Anterior Nasal Swab     Status: None   Collection Time: 10/01/24  4:00 PM   Specimen: Anterior Nasal Swab  Result Value Ref Range Status   SARS Coronavirus 2 by RT PCR NEGATIVE NEGATIVE Final    Comment: (NOTE) SARS-CoV-2 target nucleic acids are NOT DETECTED.  The SARS-CoV-2 RNA is generally detectable in upper respiratory specimens during  the acute phase of infection. The lowest concentration of  SARS-CoV-2 viral copies this assay can detect is 138 copies/mL. A negative result does not preclude SARS-Cov-2 infection and should not be used as the sole basis for treatment or other patient management decisions. A negative result may occur with  improper specimen collection/handling, submission of specimen other than nasopharyngeal swab, presence of viral mutation(s) within the areas targeted by this assay, and inadequate number of viral copies(<138 copies/mL). A negative result must be combined with clinical observations, patient history, and epidemiological information. The expected result is Negative.  Fact Sheet for Patients:  bloggercourse.com  Fact Sheet for Healthcare Providers:  seriousbroker.it  This test is no t yet approved or cleared by the [place]  FDA and  has been authorized for detection and/or diagnosis of SARS-CoV-2 by FDA under an Emergency Use Authorization (EUA). This EUA will remain  in effect (meaning this test can be used) for the duration of the COVID-19 declaration under Section 564(b)(1) of the Act, 21 U.S.C.section 360bbb-3(b)(1), unless the authorization is terminated  or revoked sooner.       Influenza A by PCR NEGATIVE NEGATIVE Final   Influenza B by PCR NEGATIVE NEGATIVE Final    Comment: (NOTE) The Xpert Xpress SARS-CoV-2/FLU/RSV plus assay is intended as an aid in the diagnosis of influenza from Nasopharyngeal swab specimens and should not be used as a sole basis for treatment. Nasal washings and aspirates are unacceptable for Xpert Xpress SARS-CoV-2/FLU/RSV testing.  Fact Sheet for Patients: bloggercourse.com  Fact Sheet for Healthcare Providers: seriousbroker.it  This test is not yet approved or cleared by the [place]  FDA and has been authorized for detection and/or  diagnosis of SARS-CoV-2 by FDA under an Emergency Use Authorization (EUA). This EUA will remain in effect (meaning this test can be used) for the duration of the COVID-19 declaration under Section 564(b)(1) of the Act, 21 U.S.C. section 360bbb-3(b)(1), unless the authorization is terminated or revoked.     Resp Syncytial Virus by PCR NEGATIVE NEGATIVE Final    Comment: (NOTE) Fact Sheet for Patients: bloggercourse.com  Fact Sheet for Healthcare Providers: seriousbroker.it  This test is not yet approved or cleared by the [place]  FDA and has been authorized for detection and/or diagnosis of SARS-CoV-2 by FDA under an Emergency Use Authorization (EUA). This EUA will remain in effect (meaning this test can be used) for the duration of the COVID-19 declaration under Section 564(b)(1) of the Act, 21 U.S.C. section 360bbb-3(b)(1), unless the authorization is terminated or revoked.  Performed at [facility], [address] [place], [place] [address]   MRSA Next Gen by PCR, Nasal     Status: None   Collection Time: 10/01/24  6:53 PM   Specimen: Nasal Mucosa; Nasal Swab  Result Value Ref Range Status   MRSA by PCR Next Gen NOT DETECTED NOT DETECTED Final    Comment: (NOTE) The GeneXpert MRSA Assay (FDA approved for NASAL specimens only), is one component of a comprehensive MRSA colonization surveillance program. It is not intended to diagnose MRSA infection nor to guide or monitor treatment for MRSA infections. Test performance is not FDA approved in patients less than 28 years old. Performed at [facility], [address], [place], [place] [address]       Radiology Studies last 3 days: DG Chest 1 View Result Date: 10/01/2024 CLINICAL DATA:  Short of breath. EXAM: CHEST  1 VIEW COMPARISON:  09/15/2024 and older studies.  CT, 08/10/2024. FINDINGS: Cardiac silhouette is normal in size. No mediastinal or  hilar  masses. Left lung base opacity appears increased from the most recent prior chest radiograph. Remainder of the lungs is clear. No pneumothorax. Skeletal structures are grossly intact. IMPRESSION: 1. Left lung base opacity appears increased from the most recent prior study. Prior study lateral view demonstrated a small associated pleural effusion. Findings consistent with left lower lobe pneumonia in the proper clinical setting. Electronically Signed   By: Alm Parkins M.D.   On: 10/01/2024 16:42          Emidio Warrell, DO [facility] 10/04/2024, 4:23 PM    Dictation software may have been used to generate the above note. Typos may occur and escape review in typed/dictated notes. Please contact Dr Marsa directly for clarity if needed.  Staff may message me via secure chat in Epic  but this may not receive an immediate response,  please page me for urgent matters!  If 7PM-7AM, please contact night coverage [email or web address]

## 2024-10-04 NOTE — Progress Notes (Signed)
  Patient ambulated on 3 L of O2 (his chronic Oxygen  requirement) O2 sats dropping to 76% with ambulation. Patient recovering to O2 sats in the 80's at rest, taken to bedside recovered to 94% on 4 Liters patient able to return to 3 L O2 after a few minutes of recovery at 4 Liters rest.

## 2024-10-04 NOTE — Plan of Care (Signed)
  Problem: Education: Goal: Knowledge of General Education information will improve Description: Including pain rating scale, medication(s)/side effects and non-pharmacologic comfort measures Outcome: Progressing   Problem: Health Behavior/Discharge Planning: Goal: Ability to manage health-related needs will improve Outcome: Progressing   Problem: Clinical Measurements: Goal: Ability to maintain clinical measurements within normal limits will improve Outcome: Progressing Goal: Will remain free from infection Outcome: Progressing Goal: Diagnostic test results will improve Outcome: Progressing Goal: Respiratory complications will improve Outcome: Progressing Goal: Cardiovascular complication will be avoided Outcome: Progressing   Problem: Activity: Goal: Risk for activity intolerance will decrease Outcome: Progressing   Problem: Nutrition: Goal: Adequate nutrition will be maintained Outcome: Progressing   Problem: Coping: Goal: Level of anxiety will decrease Outcome: Progressing   Problem: Elimination: Goal: Will not experience complications related to bowel motility Outcome: Progressing Goal: Will not experience complications related to urinary retention Outcome: Progressing   Problem: Pain Managment: Goal: General experience of comfort will improve and/or be controlled Outcome: Progressing   Problem: Safety: Goal: Ability to remain free from injury will improve Outcome: Progressing   Problem: Skin Integrity: Goal: Risk for impaired skin integrity will decrease Outcome: Progressing   Problem: Activity: Goal: Ability to tolerate increased activity will improve Outcome: Progressing   Problem: Clinical Measurements: Goal: Ability to maintain a body temperature in the normal range will improve Outcome: Progressing   Problem: Respiratory: Goal: Ability to maintain adequate ventilation will improve Outcome: Progressing Goal: Ability to maintain a clear airway  will improve Outcome: Progressing   Problem: Education: Goal: Knowledge of disease or condition will improve Outcome: Progressing Goal: Understanding of medication regimen will improve Outcome: Progressing Goal: Individualized Educational Video(s) Outcome: Progressing   Problem: Activity: Goal: Ability to tolerate increased activity will improve Outcome: Progressing   Problem: Cardiac: Goal: Ability to achieve and maintain adequate cardiopulmonary perfusion will improve Outcome: Progressing   Problem: Health Behavior/Discharge Planning: Goal: Ability to safely manage health-related needs after discharge will improve Outcome: Progressing

## 2024-10-04 NOTE — Progress Notes (Signed)
 [facility]  PROCEDURAL EXPEDITER PROGRESS NOTE  Patient Name: Bryan Kelley  DOB:29-Mar-1956 Date of Admission: 10/01/2024  Date of Assessment:10/04/24   -------------------------------------------------------------------------------------------------------------------  Pt has oxygen  at home no barriers noted at this time  -------------------------------------------------------------------------------------------------------------------  [facility] [employer], Ronal DELENA Bald Please contact us  directly via secure chat (search for [facility]) or by calling us  at [phone] [facility]).

## 2024-10-04 NOTE — Care Management Important Message (Signed)
 Important Message  Patient Details  Name: Bryan Kelley MRN: [id number] Date of Birth: 12-Sep-1956   Important Message Given:  Yes - Medicare IM     Shevette Bess W, CMA 10/04/2024, 1:04 PM

## 2024-10-04 NOTE — Plan of Care (Signed)
  Problem: Health Behavior/Discharge Planning: Goal: Ability to manage health-related needs will improve Outcome: Progressing   Problem: Nutrition: Goal: Adequate nutrition will be maintained Outcome: Progressing   Problem: Elimination: Goal: Will not experience complications related to bowel motility Outcome: Progressing   Problem: Safety: Goal: Ability to remain free from injury will improve Outcome: Progressing   Problem: Activity: Goal: Ability to tolerate increased activity will improve Outcome: Progressing   Problem: Respiratory: Goal: Ability to maintain adequate ventilation will improve Outcome: Progressing

## 2024-10-05 LAB — CBC
HCT: 35.1 % — ABNORMAL LOW (ref 39.0–52.0)
Hemoglobin: 11.1 g/dL — ABNORMAL LOW (ref 13.0–17.0)
MCH: 27.7 pg (ref 26.0–34.0)
MCHC: 31.6 g/dL (ref 30.0–36.0)
MCV: 87.5 fL (ref 80.0–100.0)
Platelets: 337 K/uL (ref 150–400)
RBC: 4.01 MIL/uL — ABNORMAL LOW (ref 4.22–5.81)
RDW: 13.8 % (ref 11.5–15.5)
WBC: 11.7 K/uL — ABNORMAL HIGH (ref 4.0–10.5)
nRBC: 0 % (ref 0.0–0.2)

## 2024-10-05 MED ORDER — BUDESONIDE-FORMOTEROL FUMARATE 80-4.5 MCG/ACT IN AERO: 2.0000 | INHALATION_SPRAY | Freq: Two times a day (BID) | RESPIRATORY_TRACT | 1 refills | Status: DC

## 2024-10-05 MED ORDER — PANTOPRAZOLE SODIUM 20 MG PO TBEC: 20.0000 mg | DELAYED_RELEASE_TABLET | Freq: Every day | ORAL | 2 refills | Status: AC

## 2024-10-05 MED ORDER — DOXYCYCLINE HYCLATE 100 MG PO TABS: 100.0000 mg | ORAL_TABLET | Freq: Two times a day (BID) | ORAL | 0 refills | Status: AC

## 2024-10-05 MED ORDER — AMIODARONE HCL 200 MG PO TABS: 200.0000 mg | ORAL_TABLET | Freq: Every day | ORAL | 0 refills | Status: DC

## 2024-10-05 MED ORDER — DILTIAZEM HCL ER COATED BEADS 180 MG PO CP24: 180.0000 mg | ORAL_CAPSULE | Freq: Every day | ORAL | 2 refills | Status: AC

## 2024-10-05 MED ORDER — CEFPODOXIME PROXETIL 200 MG PO TABS: 200.0000 mg | ORAL_TABLET | Freq: Two times a day (BID) | ORAL | 0 refills | Status: AC

## 2024-10-05 NOTE — Discharge Summary (Signed)
 Physician Discharge Summary  Bryan Kelley FMW:[id number] DOB: May 22, 1956 DOA: 10/01/2024  PCP: [facility]  Admit date: 10/01/2024 Discharge date: 10/05/2024  Admitted From: Home  Discharge disposition: Home   Recommendations for Outpatient Follow-Up:   Follow up with your primary care provider in one week.  Check CBC, BMP, magnesium  in the next visit Follow-up with your pulmonary as scheduled next month. Follow-up with electrophysiology cardiology in 2 weeks after discharge.  Internal referral to A-fib clinic has been done.   Discharge Diagnosis:   Principal Problem:   Atrial fibrillation with RVR (HCC) Active Problems:   Pneumonia of left lower lobe due to infectious organism   Discharge Condition: Improved.  Diet recommendation: Low sodium, heart healthy.   Wound care: None.  Code status: Full.   History of Present Illness:   Bryan Kelley is a 68 y.o. male with history of a flutter/A-fib on Eliquis , history of COPD on 3 L nasal cannula at baseline, history of GERD, presented hospital with shortness of breath and cough with productive sputum.  Patient stated compliant with medications.  In the ED patient was noted to have left lower zone pneumonia and was started on Rocephin  and doxycycline.  Cardiology was also consulted.    Hospital Course:   Following conditions were addressed during hospitalization as listed below,  Acute COPD (chronic obstructive pulmonary disease) exacerbation due to Pneumonia Patient received bronchodilators incentive spirometry mucolytic's and antibiotics.  Patient will be continued on antibiotics for 3 more days to complete the course.  Continue inhalers from home.  Atrial fibrillation/flutter with RVR -improved Continue Cardizem , amiodarone  Eliquis .  Cardiology following the patient during hospitalization and plan for electrophysiology follow-up in 2 weeks.  Internal referral has been made.   History of CAD. Recent  cardiac catheterization 2 months ago showed mild-moderate, nonobstructive disease other than a relatively small diagonal that had significant ostial stenosis (not amenable to intervention.  Continue statins and Eliquis .  No chest pain at this time.      Pneumonia,  Recent hospitalization.Negative for COVID flu and RSV. CXR with left lung base opacity appears increased from recent study.  Patient was started on ceftriaxone  + doxycycline.  Respiratory viral panel still pending.  Patient will continue with 3 more days of antibiotics on discharge to complete course.  Chronic hypoxic respiratory failure on home O2 3L [place]  Currently at baseline.  Will be continued on discharge.   Essential hypertension Continue diltiazem  120 mg p.o. daily    Gastroesophageal reflux disease Continue PPI    Hyperlipidemia Continue atorvastatin  80 mg daily  Overweight : Body mass index is 29.05 kg/m.Bryan Kelley  Benefit from lifestyle modification.  Disposition.  At this time, patient is stable for disposition home with outpatient PCP, cardiology and pulmonary follow-up  Medical Consultants:   Cardiology  Procedures:    None Subjective:   Today, patient was seen and examined at bedside.  Feels better today.  Wants to go home he is at his baseline.  Discharge Exam:   Vitals:   10/05/24 0812 10/05/24 1218  BP: 133/77 (!) 150/91  Pulse: 69 85  Resp: 15   Temp: 98 F (36.7 C) 98.1 F (36.7 C)  SpO2: 95% 98%   Vitals:   10/05/24 0120 10/05/24 0418 10/05/24 0812 10/05/24 1218  BP:  (!) 158/91 133/77 (!) 150/91  Pulse:  75 69 85  Resp:  16 15   Temp:  98.3 F (36.8 C) 98 F (36.7 C) 98.1 F (36.7  C)  TempSrc:    Oral  SpO2: 100% 98% 95% 98%  Weight:      Height:       Body mass index is 29.05 kg/m.  General: Alert awake, not in obvious distress, on nasal cannula oxygen  HENT: pupils equally reacting to light,  No scleral pallor or icterus noted. Oral mucosa is moist.  Chest: Diminished breath  sounds bilaterally,  CVS: S1 &S2 heard. No murmur.  Irregular rhythm Abdomen: Soft, nontender, nondistended.  Bowel sounds are heard.   Extremities: No cyanosis, clubbing or edema.  Peripheral pulses are palpable. Psych: Alert, awake and oriented, normal mood CNS:  No cranial nerve deficits.  Power equal in all extremities.   Skin: Warm and dry.  No rashes noted.  The results of significant diagnostics from this hospitalization (including imaging, microbiology, ancillary and laboratory) are listed below for reference.     Diagnostic Studies:   DG Chest 1 View Result Date: 10/01/2024 CLINICAL DATA:  Short of breath. EXAM: CHEST  1 VIEW COMPARISON:  09/15/2024 and older studies.  CT, 08/10/2024. FINDINGS: Cardiac silhouette is normal in size. No mediastinal or hilar masses. Left lung base opacity appears increased from the most recent prior chest radiograph. Remainder of the lungs is clear. No pneumothorax. Skeletal structures are grossly intact. IMPRESSION: 1. Left lung base opacity appears increased from the most recent prior study. Prior study lateral view demonstrated a small associated pleural effusion. Findings consistent with left lower lobe pneumonia in the proper clinical setting. Electronically Signed   By: Alm Parkins M.D.   On: 10/01/2024 16:42     Labs:   Basic Metabolic Panel: Recent Labs  Lab 10/01/24 1600  NA 138  K 4.9  CL 99  CO2 30  GLUCOSE 110*  BUN 12  CREATININE 0.70  CALCIUM  9.0   GFR Estimated Creatinine Clearance: 89.9 mL/min (by C-G formula based on SCr of 0.7 mg/dL). Liver Function Tests: No results for input(s): AST, ALT, ALKPHOS, BILITOT, PROT, ALBUMIN in the last 168 hours. No results for input(s): LIPASE, AMYLASE in the last 168 hours. No results for input(s): AMMONIA in the last 168 hours. Coagulation profile Recent Labs  Lab 10/01/24 2100  INR 1.2    CBC: Recent Labs  Lab 10/01/24 1600 10/01/24 2100 10/03/24 0425  10/04/24 0500 10/05/24 0437  WBC 9.1 8.1 20.5* 15.0* 11.7*  NEUTROABS 4.8  --   --   --   --   HGB 12.5* 12.2* 11.7* 10.7* 11.1*  HCT 38.9* 38.8* 37.6* 33.9* 35.1*  MCV 88.2 89.2 90.6 88.7 87.5  PLT 442* 374 347 323 337   Cardiac Enzymes: No results for input(s): CKTOTAL, CKMB, CKMBINDEX, TROPONINI in the last 168 hours. BNP: Invalid input(s): POCBNP CBG: No results for input(s): GLUCAP in the last 168 hours. D-Dimer No results for input(s): DDIMER in the last 72 hours. Hgb A1c No results for input(s): HGBA1C in the last 72 hours. Lipid Profile No results for input(s): CHOL, HDL, LDLCALC, TRIG, CHOLHDL, LDLDIRECT in the last 72 hours. Thyroid  function studies No results for input(s): TSH, T4TOTAL, T3FREE, THYROIDAB in the last 72 hours.  Invalid input(s): FREET3 Anemia work up No results for input(s): VITAMINB12, FOLATE, FERRITIN, TIBC, IRON, RETICCTPCT in the last 72 hours. Microbiology Recent Results (from the past 240 hours)  Resp panel by RT-PCR (RSV, Flu A&B, Covid) Anterior Nasal Swab     Status: None   Collection Time: 10/01/24  4:00 PM   Specimen: Anterior Nasal Swab  Result Value Ref Range Status   SARS Coronavirus 2 by RT PCR NEGATIVE NEGATIVE Final    Comment: (NOTE) SARS-CoV-2 target nucleic acids are NOT DETECTED.  The SARS-CoV-2 RNA is generally detectable in upper respiratory specimens during the acute phase of infection. The lowest concentration of SARS-CoV-2 viral copies this assay can detect is 138 copies/mL. A negative result does not preclude SARS-Cov-2 infection and should not be used as the sole basis for treatment or other patient management decisions. A negative result may occur with  improper specimen collection/handling, submission of specimen other than nasopharyngeal swab, presence of viral mutation(s) within the areas targeted by this assay, and inadequate number of viral copies(<138  copies/mL). A negative result must be combined with clinical observations, patient history, and epidemiological information. The expected result is Negative.  Fact Sheet for Patients:  bloggercourse.com  Fact Sheet for Healthcare Providers:  seriousbroker.it  This test is no t yet approved or cleared by the [place]  FDA and  has been authorized for detection and/or diagnosis of SARS-CoV-2 by FDA under an Emergency Use Authorization (EUA). This EUA will remain  in effect (meaning this test can be used) for the duration of the COVID-19 declaration under Section 564(b)(1) of the Act, 21 U.S.C.section 360bbb-3(b)(1), unless the authorization is terminated  or revoked sooner.       Influenza A by PCR NEGATIVE NEGATIVE Final   Influenza B by PCR NEGATIVE NEGATIVE Final    Comment: (NOTE) The Xpert Xpress SARS-CoV-2/FLU/RSV plus assay is intended as an aid in the diagnosis of influenza from Nasopharyngeal swab specimens and should not be used as a sole basis for treatment. Nasal washings and aspirates are unacceptable for Xpert Xpress SARS-CoV-2/FLU/RSV testing.  Fact Sheet for Patients: bloggercourse.com  Fact Sheet for Healthcare Providers: seriousbroker.it  This test is not yet approved or cleared by the [place]  FDA and has been authorized for detection and/or diagnosis of SARS-CoV-2 by FDA under an Emergency Use Authorization (EUA). This EUA will remain in effect (meaning this test can be used) for the duration of the COVID-19 declaration under Section 564(b)(1) of the Act, 21 U.S.C. section 360bbb-3(b)(1), unless the authorization is terminated or revoked.     Resp Syncytial Virus by PCR NEGATIVE NEGATIVE Final    Comment: (NOTE) Fact Sheet for Patients: bloggercourse.com  Fact Sheet for Healthcare  Providers: seriousbroker.it  This test is not yet approved or cleared by the [place]  FDA and has been authorized for detection and/or diagnosis of SARS-CoV-2 by FDA under an Emergency Use Authorization (EUA). This EUA will remain in effect (meaning this test can be used) for the duration of the COVID-19 declaration under Section 564(b)(1) of the Act, 21 U.S.C. section 360bbb-3(b)(1), unless the authorization is terminated or revoked.  Performed at [facility], [address] [place], [place] [address]   MRSA Next Gen by PCR, Nasal     Status: None   Collection Time: 10/01/24  6:53 PM   Specimen: Nasal Mucosa; Nasal Swab  Result Value Ref Range Status   MRSA by PCR Next Gen NOT DETECTED NOT DETECTED Final    Comment: (NOTE) The GeneXpert MRSA Assay (FDA approved for NASAL specimens only), is one component of a comprehensive MRSA colonization surveillance program. It is not intended to diagnose MRSA infection nor to guide or monitor treatment for MRSA infections. Test performance is not FDA approved in patients less than 6 years old. Performed at [facility], [address] [place], [place] [address]  Discharge Instructions:   Discharge Instructions     Amb referral to AFIB Clinic   Complete by: As directed    Call MD for:  temperature >100.4   Complete by: As directed    Diet - low sodium heart healthy   Complete by: As directed    Discharge instructions   Complete by: As directed    Follow up with your primary care in one week. Folllow up with Dr Theotis pulmonary as has been scheduled.  Tale medications as prescribed. NO overexertion. Continue oxygen  at home.   Increase activity slowly   Complete by: As directed       Allergies as of 10/05/2024       Reactions   Aspirin         Medication List     STOP taking these medications    brinzolamide  1 % ophthalmic suspension Commonly known as: AZOPT         TAKE these medications    acetaminophen  325 MG tablet Commonly known as: TYLENOL  Take 2 tablets (650 mg total) by mouth every 6 (six) hours as needed for mild pain, fever or headache.   albuterol  108 (90 Base) MCG/ACT inhaler Commonly known as: VENTOLIN  HFA Inhale 2 puffs into the lungs every 6 (six) hours as needed for wheezing or shortness of breath.   amiodarone  200 MG tablet Commonly known as: PACERONE  Take 1 tablet (200 mg total) by mouth daily. What changed: See the new instructions.   apixaban  5 MG Tabs tablet Commonly known as: ELIQUIS  Take 1 tablet (5 mg total) by mouth 2 (two) times daily.   atorvastatin  80 MG tablet Commonly known as: LIPITOR  Take 1 tablet (80 mg total) by mouth daily.   brimonidine  0.2 % ophthalmic solution Commonly known as: ALPHAGAN  Place 1 drop into the left eye 2 (two) times daily.   budesonide -formoterol  80-4.5 MCG/ACT inhaler Commonly known as: Symbicort  Inhale 2 puffs into the lungs 2 (two) times daily.   cefpodoxime  200 MG tablet Commonly known as: VANTIN  Take 1 tablet (200 mg total) by mouth 2 (two) times daily for 3 days.   diltiazem  180 MG 24 hr capsule Commonly known as: CARDIZEM  CD Take 1 capsule (180 mg total) by mouth daily. What changed:  medication strength how much to take   doxycycline  100 MG tablet Commonly known as: VIBRA -TABS Take 1 tablet (100 mg total) by mouth every 12 (twelve) hours for 3 days.   ipratropium-albuterol  0.5-2.5 (3) MG/3ML Soln Commonly known as: DUONEB Take 3 mLs by nebulization 4 (four) times daily as needed.   lidocaine  5 % Commonly known as: Lidoderm  Place 1 patch onto the skin every 12 (twelve) hours. Remove & Discard patch within 12 hours or as directed by MD.  Ginnie the patch off for 12 hours before applying a new one.   montelukast  10 MG tablet Commonly known as: SINGULAIR  Take 1 tablet (10 mg total) by mouth at bedtime.   pantoprazole  20 MG tablet Commonly known as:  PROTONIX  Take 1 tablet (20 mg total) by mouth daily.   polyethylene glycol 17 g packet Commonly known as: MIRALAX  / GLYCOLAX  Take 17 g by mouth 2 (two) times daily.   sildenafil 100 MG tablet Commonly known as: VIAGRA Take 100 mg by mouth as needed for erectile dysfunction.   tamsulosin  0.4 MG Caps capsule Commonly known as: FLOMAX  Take 1 capsule (0.4 mg total) by mouth daily.        Follow-up Information     [facility] Follow up in 1 week(s).   Contact information: [address] [place] [address] [phone]                  Time coordinating discharge: 39 minutes  Signed:  Jeri Rawlins  [facility] 10/05/2024, 1:02 PM

## 2024-10-05 NOTE — Plan of Care (Signed)
  Problem: Education: Goal: Knowledge of General Education information will improve Description: Including pain rating scale, medication(s)/side effects and non-pharmacologic comfort measures Outcome: Adequate for Discharge   Problem: Health Behavior/Discharge Planning: Goal: Ability to manage health-related needs will improve Outcome: Adequate for Discharge   Problem: Clinical Measurements: Goal: Ability to maintain clinical measurements within normal limits will improve Outcome: Adequate for Discharge Goal: Will remain free from infection Outcome: Adequate for Discharge Goal: Diagnostic test results will improve Outcome: Adequate for Discharge Goal: Respiratory complications will improve Outcome: Adequate for Discharge Goal: Cardiovascular complication will be avoided Outcome: Adequate for Discharge   Problem: Activity: Goal: Risk for activity intolerance will decrease Outcome: Adequate for Discharge   Problem: Nutrition: Goal: Adequate nutrition will be maintained Outcome: Adequate for Discharge   Problem: Coping: Goal: Level of anxiety will decrease Outcome: Adequate for Discharge   Problem: Elimination: Goal: Will not experience complications related to bowel motility Outcome: Adequate for Discharge Goal: Will not experience complications related to urinary retention Outcome: Adequate for Discharge   Problem: Pain Managment: Goal: General experience of comfort will improve and/or be controlled Outcome: Adequate for Discharge   Problem: Safety: Goal: Ability to remain free from injury will improve Outcome: Adequate for Discharge   Problem: Skin Integrity: Goal: Risk for impaired skin integrity will decrease Outcome: Adequate for Discharge   Problem: Activity: Goal: Ability to tolerate increased activity will improve Outcome: Adequate for Discharge   Problem: Clinical Measurements: Goal: Ability to maintain a body temperature in the normal range will  improve Outcome: Adequate for Discharge   Problem: Respiratory: Goal: Ability to maintain adequate ventilation will improve Outcome: Adequate for Discharge Goal: Ability to maintain a clear airway will improve Outcome: Adequate for Discharge   Problem: Education: Goal: Knowledge of disease or condition will improve Outcome: Adequate for Discharge Goal: Understanding of medication regimen will improve Outcome: Adequate for Discharge Goal: Individualized Educational Video(s) Outcome: Adequate for Discharge   Problem: Activity: Goal: Ability to tolerate increased activity will improve Outcome: Adequate for Discharge   Problem: Cardiac: Goal: Ability to achieve and maintain adequate cardiopulmonary perfusion will improve Outcome: Adequate for Discharge   Problem: Health Behavior/Discharge Planning: Goal: Ability to safely manage health-related needs after discharge will improve Outcome: Adequate for Discharge

## 2024-10-05 NOTE — Plan of Care (Signed)
  Problem: Education: Goal: Knowledge of General Education information will improve Description: Including pain rating scale, medication(s)/side effects and non-pharmacologic comfort measures Outcome: Progressing   Problem: Health Behavior/Discharge Planning: Goal: Ability to manage health-related needs will improve Outcome: Progressing   Problem: Clinical Measurements: Goal: Ability to maintain clinical measurements within normal limits will improve Outcome: Progressing Goal: Will remain free from infection Outcome: Progressing Goal: Diagnostic test results will improve Outcome: Progressing Goal: Respiratory complications will improve Outcome: Progressing Goal: Cardiovascular complication will be avoided Outcome: Progressing   Problem: Activity: Goal: Risk for activity intolerance will decrease Outcome: Progressing   Problem: Nutrition: Goal: Adequate nutrition will be maintained Outcome: Progressing   Problem: Coping: Goal: Level of anxiety will decrease Outcome: Progressing   Problem: Elimination: Goal: Will not experience complications related to bowel motility Outcome: Progressing Goal: Will not experience complications related to urinary retention Outcome: Progressing   Problem: Pain Managment: Goal: General experience of comfort will improve and/or be controlled Outcome: Progressing   Problem: Safety: Goal: Ability to remain free from injury will improve Outcome: Progressing   Problem: Skin Integrity: Goal: Risk for impaired skin integrity will decrease Outcome: Progressing   Problem: Activity: Goal: Ability to tolerate increased activity will improve Outcome: Progressing   Problem: Clinical Measurements: Goal: Ability to maintain a body temperature in the normal range will improve Outcome: Progressing   Problem: Respiratory: Goal: Ability to maintain adequate ventilation will improve Outcome: Progressing Goal: Ability to maintain a clear airway  will improve Outcome: Progressing   Problem: Education: Goal: Knowledge of disease or condition will improve Outcome: Progressing Goal: Understanding of medication regimen will improve Outcome: Progressing Goal: Individualized Educational Video(s) Outcome: Progressing   Problem: Activity: Goal: Ability to tolerate increased activity will improve Outcome: Progressing   Problem: Cardiac: Goal: Ability to achieve and maintain adequate cardiopulmonary perfusion will improve Outcome: Progressing   Problem: Health Behavior/Discharge Planning: Goal: Ability to safely manage health-related needs after discharge will improve Outcome: Progressing

## 2024-10-05 NOTE — TOC Transition Note (Signed)
 Transition of Care [facility]) - Discharge Note   Patient Details  Name: TAVIOUS GRIESINGER MRN: [id number] Date of Birth: 13-Sep-1956  [facility]) CM/SW Contact:  Shasta DELENA Daring, RN Phone Number: 10/05/2024, 10:42 AM   Clinical Narrative:     Patient has signed discharge order. No TOC needs. RNCM signing off.        Patient Goals and CMS Choice            Discharge Placement                       Discharge Plan and Services Additional resources added to the After Visit Summary for                                       Social Drivers of Health (SDOH) Interventions SDOH Screenings   Food Insecurity: Patient Declined (10/02/2024)  Housing: Unknown (10/02/2024)  Transportation Needs: Patient Declined (10/02/2024)  Utilities: Patient Declined (10/02/2024)  Social Connections: Patient Declined (10/02/2024)  Tobacco Use: Medium Risk (10/01/2024)     Readmission Risk Interventions    10/03/2024    4:46 PM  Readmission Risk Prevention Plan  Transportation Screening Complete  PCP or Specialist Appt within 3-5 Days --  HRI or Home Care Consult Complete  Social Work Consult for Recovery Care Planning/Counseling Complete  Palliative Care Screening Not Applicable  Medication Review [employer]) Complete

## 2024-10-06 LAB — LEGIONELLA PNEUMOPHILA SEROGP 1 UR AG: L. pneumophila Serogp 1 Ur Ag: NEGATIVE

## 2024-10-13 ENCOUNTER — Ambulatory Visit: Admitting: Urology

## 2024-10-13 DIAGNOSIS — N400 Enlarged prostate without lower urinary tract symptoms: Secondary | ICD-10-CM

## 2024-10-14 ENCOUNTER — Encounter: Payer: Self-pay | Admitting: Urology

## 2024-10-17 NOTE — Progress Notes (Deleted)
 Electrophysiology Clinic Note    Date:  10/17/2024  Patient ID:  Bryan Kelley, [place] 1955/12/04, MRN [id number] PCP:  [facility]  Cardiologist:  Alm Clay, MD  Electrophysiology APP:  Pauleen Goleman, NP    ***refresh  Discussed the use of AI scribe software for clinical note transcription with the patient, who gave verbal consent to proceed.   Patient Profile    Chief Complaint: ***  History of Present Illness: Bryan Kelley is a 68 y.o. male with PMH notable for newly diagnosed afib/flutter, non-obs CAD, HTN, COPD on home Os, ETOH use ; seen today for EP evaluation of AFib.   He presented to [facility] ER 9/25 with chest discomfort and worsening dyspnea, chest pain mostly with inspiration. EKG concerning for STEMI and appeared sinus tach and was taken urgently to cath lab. During procedure, had increased ventricular rate to ~150s appeared SVT, was given adenosine  that revealed fib/flutter waves. LHC with non-obs CAD. He was started on IV  dilt and heparin . He converted to sinus rhythm and was discharged on 120 dilt and eliquis .   He then presented again to [facility] ER 10/8 with AFIb w RVR, he had not been compliant with dilt and eliquis  at previous discharge. He was started on IV dilt in ER with improvement in rates. He remained in aflutter and s/p TEE/DCCV. He was discharged on 240mg  dilt and elquis on 10/14 in sinus rhythm.   On follow-up today, *** AF burden, symptoms *** palpitations *** bleeding concerns   - consider tikosyn - ablation  Since last being seen in our clinic the patient reports doing ***.  he denies chest pain, palpitations, dyspnea, PND, orthopnea, nausea, vomiting, dizziness, syncope, edema, weight gain, or early satiety.    Arrhythmia/Device History No specialty comments available.    ROS:  Please see the history of present illness. All other systems are reviewed and otherwise negative.    Physical Exam    VS:  There were no  vitals taken for this visit. BMI: There is no height or weight on file to calculate BMI.           Wt Readings from Last 3 Encounters:  10/01/24 180 lb (81.6 kg)  09/16/24 169 lb 15.6 oz (77.1 kg)  08/28/24 180 lb (81.6 kg)     GEN- The patient is well appearing, alert and oriented x 3 today.   Lungs- Clear to ausculation bilaterally, normal work of breathing.  Heart- {Blank single:19197::Regular,Irregularly irregular} rate and rhythm, no murmurs, rubs or gallops Extremities- {EDEMA LEVEL:28147::No} peripheral edema, warm, dry  Studies Reviewed   Previous EP, cardiology notes.    EKG is ordered. Personal review of EKG from today shows:  ***        TEE, 08/16/2024  1. Left ventricular ejection fraction, by estimation, is 60 to 65%. The left ventricle has normal function.   2. No left atrial/left atrial appendage thrombus was detected. The LAA emptying velocity was 40 cm/s.   Conclusion(s)/Recommendation(s): Normal biventricular function without evidence of hemodynamically significant valvular heart disease. No LA/LAA thrombus identified. Successful cardioversion performed with restoration of normal sinus rhythm.   TTE, 07/29/2024  1. Left ventricular ejection fraction, by estimation, is 65 to 70%. The left ventricle has normal function. The left ventricle has no regional wall motion abnormalities. Left ventricular diastolic parameters are consistent with Grade I diastolic dysfunction (impaired relaxation).   2. Right ventricular systolic function is normal. The right ventricular size is normal.  3. The mitral valve is normal in structure. No evidence of mitral valve regurgitation.   4. The aortic valve was not well visualized. Aortic valve regurgitation is not visualized.   5. The inferior vena cava is normal in size with greater than 50% respiratory variability, suggesting right atrial pressure of 3 mmHg.   LHC, 07/28/2024   Mid LAD lesion is 50% stenosed with 90%  stenosed side branch in 2nd Diag.   Ost RCA to Prox RCA lesion is 30% stenosed. Prox RCA lesion is 30% stenosed. Prox RCA to Mid RCA lesion is 25% stenosed.   There is hyperdynamic left ventricular systolic function.  The left ventricular ejection fraction is greater than 65% by visual estimate. LV end diastolic pressure is severely elevated.   A-fib/flutter with RVR developed upon completion of procedure => flutter/fib waves noted with IV adenosine  12 mg => rate improved with 10 mg IV diltiazem   TTE, 05/10/2021 1. Left ventricular ejection fraction, by estimation, is 60 to 65%. The left ventricle has normal function. The left ventricle has no regional wall motion abnormalities. Left ventricular diastolic parameters were normal.   2. Right ventricular systolic function is normal. The right ventricular size is normal.   3. The mitral valve is normal in structure. Trivial mitral valve regurgitation.   4. The aortic valve is normal in structure. Aortic valve regurgitation is trivial.      Assessment and Plan    #) persis AFib S/p DCCV  10/14   #) Hypercoag d/t persis afib CHA2DS2-VASc Score = at least 3 [CHF History: 0, HTN History: 1, Diabetes History: 0, Stroke History: 0, Vascular Disease History: 1, Age Score: 1, Gender Score: 0].  Therefore, the patient's annual risk of stroke is 3.2 %.     {Confirm score is correct.  If not, click here to update score.  REFRESH note.  :1}   Stroke ppx - 5mg  eliquis  BID, appropriately dosed No bleeding concerns   #) *** #) ***   #) ***   {Are you ordering a CV Procedure (e.g. stress test, cath, DCCV, TEE, etc)?   Press F2        :[id number]}   Current medicines are reviewed at length with the patient today.   The patient {ACTIONS; HAS/DOES NOT HAVE:19233} concerns regarding his medicines.  The following changes were made today:  {NONE DEFAULTED:18576}  Labs/ tests ordered today include: *** No orders of the defined types were placed in this  encounter.    Disposition: Follow up with {EPMDS:28135::EP Team} or EP APP {EPFOLLOW UP:28173}   Signed, Chantal Needle, NP  10/17/2024  9:01 AM  Electrophysiology [facility]

## 2024-10-18 ENCOUNTER — Ambulatory Visit: Attending: Cardiology | Admitting: Cardiology

## 2024-10-18 NOTE — Anesthesia Postprocedure Evaluation (Signed)
 Anesthesia Post Note  Patient: Bryan Kelley  Procedure(s) Performed: CARDIOVERSION  Patient location during evaluation: PACU Anesthesia Type: General Level of consciousness: awake and alert Pain management: pain level controlled Vital Signs Assessment: post-procedure vital signs reviewed and stable Respiratory status: spontaneous breathing, nonlabored ventilation, respiratory function stable and patient connected to nasal cannula oxygen  Cardiovascular status: blood pressure returned to baseline and stable Postop Assessment: no apparent nausea or vomiting Anesthetic complications: no   No notable events documented.   Last Vitals:  Vitals:   09/17/24 1013 09/17/24 1130  BP:  130/70  Pulse:  81  Resp:  14  Temp:  36.9 C  SpO2: 100% 100%    Last Pain:  Vitals:   09/17/24 1130  TempSrc: Oral  PainSc:                  [name]

## 2024-10-26 ENCOUNTER — Emergency Department

## 2024-10-26 ENCOUNTER — Encounter: Payer: Self-pay | Admitting: Internal Medicine

## 2024-10-26 ENCOUNTER — Other Ambulatory Visit: Payer: Self-pay

## 2024-10-26 ENCOUNTER — Inpatient Hospital Stay
Admission: EM | Admit: 2024-10-26 | Discharge: 2024-11-04 | DRG: 189 | Disposition: A | Discharge status: Home / Self Care | Attending: Student | Admitting: Student

## 2024-10-26 ENCOUNTER — Inpatient Hospital Stay

## 2024-10-26 DIAGNOSIS — E0800 Diabetes mellitus due to underlying condition with hyperosmolarity without nonketotic hyperglycemic-hyperosmolar coma (NKHHC): Secondary | ICD-10-CM

## 2024-10-26 DIAGNOSIS — I4891 Unspecified atrial fibrillation: Secondary | ICD-10-CM | POA: Diagnosis not present

## 2024-10-26 DIAGNOSIS — E663 Overweight: Secondary | ICD-10-CM | POA: Diagnosis not present

## 2024-10-26 DIAGNOSIS — I3139 Other pericardial effusion (noninflammatory): Secondary | ICD-10-CM | POA: Diagnosis present

## 2024-10-26 DIAGNOSIS — Z5320 Procedure and treatment not carried out because of patient's decision for unspecified reasons: Secondary | ICD-10-CM | POA: Diagnosis present

## 2024-10-26 DIAGNOSIS — Z833 Family history of diabetes mellitus: Secondary | ICD-10-CM

## 2024-10-26 DIAGNOSIS — Z8249 Family history of ischemic heart disease and other diseases of the circulatory system: Secondary | ICD-10-CM

## 2024-10-26 DIAGNOSIS — N400 Enlarged prostate without lower urinary tract symptoms: Secondary | ICD-10-CM | POA: Diagnosis present

## 2024-10-26 DIAGNOSIS — Z635 Disruption of family by separation and divorce: Secondary | ICD-10-CM

## 2024-10-26 DIAGNOSIS — J9601 Acute respiratory failure with hypoxia: Secondary | ICD-10-CM | POA: Diagnosis not present

## 2024-10-26 DIAGNOSIS — R7303 Prediabetes: Secondary | ICD-10-CM | POA: Diagnosis present

## 2024-10-26 DIAGNOSIS — Z7951 Long term (current) use of inhaled steroids: Secondary | ICD-10-CM | POA: Diagnosis not present

## 2024-10-26 DIAGNOSIS — I2510 Atherosclerotic heart disease of native coronary artery without angina pectoris: Secondary | ICD-10-CM | POA: Diagnosis present

## 2024-10-26 DIAGNOSIS — Z87891 Personal history of nicotine dependence: Secondary | ICD-10-CM

## 2024-10-26 DIAGNOSIS — I4819 Other persistent atrial fibrillation: Secondary | ICD-10-CM | POA: Diagnosis present

## 2024-10-26 DIAGNOSIS — F32A Depression, unspecified: Secondary | ICD-10-CM | POA: Diagnosis present

## 2024-10-26 DIAGNOSIS — J449 Chronic obstructive pulmonary disease, unspecified: Secondary | ICD-10-CM | POA: Diagnosis not present

## 2024-10-26 DIAGNOSIS — J9621 Acute and chronic respiratory failure with hypoxia: Principal | ICD-10-CM | POA: Diagnosis present

## 2024-10-26 DIAGNOSIS — Z79899 Other long term (current) drug therapy: Secondary | ICD-10-CM | POA: Diagnosis not present

## 2024-10-26 DIAGNOSIS — I5033 Acute on chronic diastolic (congestive) heart failure: Secondary | ICD-10-CM | POA: Diagnosis not present

## 2024-10-26 DIAGNOSIS — I5030 Unspecified diastolic (congestive) heart failure: Secondary | ICD-10-CM | POA: Diagnosis not present

## 2024-10-26 DIAGNOSIS — Z825 Family history of asthma and other chronic lower respiratory diseases: Secondary | ICD-10-CM

## 2024-10-26 DIAGNOSIS — R0602 Shortness of breath: Secondary | ICD-10-CM | POA: Diagnosis present

## 2024-10-26 DIAGNOSIS — Z9981 Dependence on supplemental oxygen: Secondary | ICD-10-CM | POA: Diagnosis not present

## 2024-10-26 DIAGNOSIS — H548 Legal blindness, as defined in USA: Secondary | ICD-10-CM | POA: Diagnosis present

## 2024-10-26 DIAGNOSIS — R0781 Pleurodynia: Secondary | ICD-10-CM | POA: Diagnosis present

## 2024-10-26 DIAGNOSIS — F1010 Alcohol abuse, uncomplicated: Secondary | ICD-10-CM | POA: Diagnosis present

## 2024-10-26 DIAGNOSIS — J441 Chronic obstructive pulmonary disease with (acute) exacerbation: Principal | ICD-10-CM | POA: Diagnosis present

## 2024-10-26 DIAGNOSIS — Z8616 Personal history of COVID-19: Secondary | ICD-10-CM

## 2024-10-26 DIAGNOSIS — Z7901 Long term (current) use of anticoagulants: Secondary | ICD-10-CM | POA: Diagnosis not present

## 2024-10-26 DIAGNOSIS — I110 Hypertensive heart disease with heart failure: Secondary | ICD-10-CM | POA: Diagnosis present

## 2024-10-26 DIAGNOSIS — D649 Anemia, unspecified: Secondary | ICD-10-CM | POA: Diagnosis present

## 2024-10-26 DIAGNOSIS — I4892 Unspecified atrial flutter: Secondary | ICD-10-CM | POA: Diagnosis not present

## 2024-10-26 DIAGNOSIS — I10 Essential (primary) hypertension: Secondary | ICD-10-CM | POA: Diagnosis present

## 2024-10-26 DIAGNOSIS — E785 Hyperlipidemia, unspecified: Secondary | ICD-10-CM | POA: Diagnosis present

## 2024-10-26 DIAGNOSIS — I483 Typical atrial flutter: Secondary | ICD-10-CM | POA: Diagnosis present

## 2024-10-26 DIAGNOSIS — I5032 Chronic diastolic (congestive) heart failure: Secondary | ICD-10-CM | POA: Diagnosis present

## 2024-10-26 DIAGNOSIS — Z823 Family history of stroke: Secondary | ICD-10-CM

## 2024-10-26 DIAGNOSIS — K219 Gastro-esophageal reflux disease without esophagitis: Secondary | ICD-10-CM | POA: Diagnosis present

## 2024-10-26 DIAGNOSIS — I700 Atherosclerosis of aorta: Secondary | ICD-10-CM | POA: Diagnosis present

## 2024-10-26 DIAGNOSIS — I480 Paroxysmal atrial fibrillation: Secondary | ICD-10-CM | POA: Diagnosis not present

## 2024-10-26 DIAGNOSIS — Z6826 Body mass index (BMI) 26.0-26.9, adult: Secondary | ICD-10-CM

## 2024-10-26 DIAGNOSIS — Z9861 Coronary angioplasty status: Secondary | ICD-10-CM | POA: Diagnosis not present

## 2024-10-26 HISTORY — DX: Unspecified atrial flutter: I48.92

## 2024-10-26 HISTORY — DX: Other ill-defined heart diseases: I51.89

## 2024-10-26 HISTORY — DX: Atherosclerotic heart disease of native coronary artery without angina pectoris: I25.10

## 2024-10-26 LAB — BASIC METABOLIC PANEL WITH GFR
Anion gap: 9 (ref 5–15)
BUN: 11 mg/dL (ref 8–23)
CO2: 33 mmol/L — ABNORMAL HIGH (ref 22–32)
Calcium: 8.7 mg/dL — ABNORMAL LOW (ref 8.9–10.3)
Chloride: 95 mmol/L — ABNORMAL LOW (ref 98–111)
Creatinine, Ser: 0.76 mg/dL (ref 0.61–1.24)
GFR, Estimated: >60 mL/min
Glucose, Bld: 144 mg/dL — ABNORMAL HIGH (ref 70–99)
Potassium: 3.7 mmol/L (ref 3.5–5.1)
Sodium: 137 mmol/L (ref 135–145)

## 2024-10-26 LAB — CBC
HCT: 34.6 % — ABNORMAL LOW (ref 39.0–52.0)
Hemoglobin: 10.9 g/dL — ABNORMAL LOW (ref 13.0–17.0)
MCH: 27.8 pg (ref 26.0–34.0)
MCHC: 31.5 g/dL (ref 30.0–36.0)
MCV: 88.3 fL (ref 80.0–100.0)
Platelets: 316 K/uL (ref 150–400)
RBC: 3.92 MIL/uL — ABNORMAL LOW (ref 4.22–5.81)
RDW: 13.9 % (ref 11.5–15.5)
WBC: 8.2 K/uL (ref 4.0–10.5)
nRBC: 0 % (ref 0.0–0.2)

## 2024-10-26 LAB — TROPONIN T, HIGH SENSITIVITY: Troponin T High Sensitivity: 18 ng/L (ref 0–19)

## 2024-10-26 LAB — PRO BRAIN NATRIURETIC PEPTIDE: Pro Brain Natriuretic Peptide: 952 pg/mL — ABNORMAL HIGH

## 2024-10-26 LAB — PHOSPHORUS: Phosphorus: 2.7 mg/dL (ref 2.5–4.6)

## 2024-10-26 LAB — MAGNESIUM: Magnesium: 1.9 mg/dL (ref 1.7–2.4)

## 2024-10-26 MED ORDER — OXYCODONE-ACETAMINOPHEN 5-325 MG PO TABS: 1.0000 | ORAL_TABLET | ORAL | Status: DC | PRN

## 2024-10-26 MED ORDER — METOPROLOL TARTRATE 5 MG/5ML IV SOLN
2.5000 mg | Freq: Once | INTRAVENOUS | Status: AC
  Administered 2024-10-26: 2.5 mg via INTRAVENOUS
  Filled 2024-10-26: qty 5

## 2024-10-26 MED ORDER — ALBUTEROL SULFATE (2.5 MG/3ML) 0.083% IN NEBU: 2.5000 mg | INHALATION_SOLUTION | RESPIRATORY_TRACT | Status: DC | PRN

## 2024-10-26 MED ORDER — PANTOPRAZOLE SODIUM 20 MG PO TBEC
20.0000 mg | DELAYED_RELEASE_TABLET | Freq: Every day | ORAL | Status: DC
  Administered 2024-10-27 – 2024-11-04 (×9): 20 mg via ORAL
  Filled 2024-10-26 (×4): qty 1

## 2024-10-26 MED ORDER — FOLIC ACID 1 MG PO TABS
1.0000 mg | ORAL_TABLET | Freq: Every day | ORAL | Status: DC
  Administered 2024-10-27 – 2024-11-04 (×9): 1 mg via ORAL
  Filled 2024-10-26 (×4): qty 1

## 2024-10-26 MED ORDER — DOXYCYCLINE HYCLATE 100 MG PO TABS
100.0000 mg | ORAL_TABLET | Freq: Two times a day (BID) | ORAL | Status: AC
  Administered 2024-10-26 – 2024-10-31 (×10): 100 mg via ORAL
  Filled 2024-10-26 (×9): qty 1

## 2024-10-26 MED ORDER — ATORVASTATIN CALCIUM 20 MG PO TABS
80.0000 mg | ORAL_TABLET | Freq: Every day | ORAL | Status: DC
  Administered 2024-10-27 – 2024-11-04 (×9): 80 mg via ORAL
  Filled 2024-10-26 (×4): qty 1

## 2024-10-26 MED ORDER — METHYLPREDNISOLONE SODIUM SUCC 125 MG IJ SOLR
125.0000 mg | Freq: Once | INTRAMUSCULAR | Status: AC
  Administered 2024-10-26: 125 mg via INTRAVENOUS
  Filled 2024-10-26: qty 2

## 2024-10-26 MED ORDER — APIXABAN 5 MG PO TABS
5.0000 mg | ORAL_TABLET | Freq: Two times a day (BID) | ORAL | Status: DC
  Administered 2024-10-26 – 2024-11-04 (×18): 5 mg via ORAL
  Filled 2024-10-26 (×9): qty 1

## 2024-10-26 MED ORDER — DIPHENHYDRAMINE HCL 50 MG/ML IJ SOLN: 12.5000 mg | Freq: Three times a day (TID) | INTRAMUSCULAR | Status: DC | PRN

## 2024-10-26 MED ORDER — THIAMINE MONONITRATE 100 MG PO TABS
100.0000 mg | ORAL_TABLET | Freq: Every day | ORAL | Status: DC
  Administered 2024-10-27 – 2024-11-03 (×8): 100 mg via ORAL
  Filled 2024-10-26 (×4): qty 1

## 2024-10-26 MED ORDER — DILTIAZEM HCL-DEXTROSE 125-5 MG/125ML-% IV SOLN (PREMIX)
5.0000 mg/h | INTRAVENOUS | Status: DC
  Administered 2024-10-26 – 2024-10-27 (×2): 5 mg/h via INTRAVENOUS
  Administered 2024-10-27 – 2024-10-31 (×12): 15 mg/h via INTRAVENOUS
  Filled 2024-10-26 (×13): qty 125

## 2024-10-26 MED ORDER — POTASSIUM CHLORIDE CRYS ER 20 MEQ PO TBCR
40.0000 meq | EXTENDED_RELEASE_TABLET | Freq: Once | ORAL | Status: AC
  Administered 2024-10-26: 40 meq via ORAL
  Filled 2024-10-26: qty 2

## 2024-10-26 MED ORDER — IOHEXOL 350 MG/ML SOLN
75.0000 mL | Freq: Once | INTRAVENOUS | Status: AC | PRN
  Administered 2024-10-26: 75 mL via INTRAVENOUS

## 2024-10-26 MED ORDER — METHYLPREDNISOLONE SODIUM SUCC 125 MG IJ SOLR
80.0000 mg | Freq: Every day | INTRAMUSCULAR | Status: DC
  Administered 2024-10-27: 80 mg via INTRAVENOUS
  Filled 2024-10-26 (×2): qty 2

## 2024-10-26 MED ORDER — AZITHROMYCIN 250 MG PO TABS: 250.0000 mg | ORAL_TABLET | Freq: Every day | ORAL | Status: DC

## 2024-10-26 MED ORDER — AMIODARONE HCL 200 MG PO TABS
200.0000 mg | ORAL_TABLET | Freq: Every day | ORAL | Status: DC
  Administered 2024-10-26 – 2024-10-28 (×3): 200 mg via ORAL
  Filled 2024-10-26 (×3): qty 1

## 2024-10-26 MED ORDER — MONTELUKAST SODIUM 10 MG PO TABS
10.0000 mg | ORAL_TABLET | Freq: Every day | ORAL | Status: DC
  Administered 2024-10-26 – 2024-11-03 (×9): 10 mg via ORAL
  Filled 2024-10-26 (×5): qty 1

## 2024-10-26 MED ORDER — IPRATROPIUM-ALBUTEROL 0.5-2.5 (3) MG/3ML IN SOLN
3.0000 mL | Freq: Once | RESPIRATORY_TRACT | Status: AC
  Administered 2024-10-26: 3 mL via RESPIRATORY_TRACT
  Filled 2024-10-26: qty 3

## 2024-10-26 MED ORDER — HYDRALAZINE HCL 20 MG/ML IJ SOLN: 5.0000 mg | INTRAMUSCULAR | Status: DC | PRN

## 2024-10-26 MED ORDER — DM-GUAIFENESIN ER 30-600 MG PO TB12
1.0000 | ORAL_TABLET | Freq: Two times a day (BID) | ORAL | Status: DC | PRN
  Administered 2024-10-27 – 2024-10-31 (×5): 1 via ORAL
  Filled 2024-10-26 (×5): qty 1

## 2024-10-26 MED ORDER — DILTIAZEM HCL 25 MG/5ML IV SOLN
15.0000 mg | Freq: Once | INTRAVENOUS | Status: AC
  Administered 2024-10-26: 15 mg via INTRAVENOUS
  Filled 2024-10-26: qty 5

## 2024-10-26 MED ORDER — ALBUTEROL SULFATE HFA 108 (90 BASE) MCG/ACT IN AERS: 2.0000 | INHALATION_SPRAY | RESPIRATORY_TRACT | Status: DC | PRN

## 2024-10-26 MED ORDER — ACETAMINOPHEN 325 MG PO TABS: 650.0000 mg | ORAL_TABLET | Freq: Four times a day (QID) | ORAL | Status: DC | PRN

## 2024-10-26 MED ORDER — IPRATROPIUM-ALBUTEROL 0.5-2.5 (3) MG/3ML IN SOLN
3.0000 mL | RESPIRATORY_TRACT | Status: DC
  Administered 2024-10-26 – 2024-10-29 (×15): 3 mL via RESPIRATORY_TRACT
  Filled 2024-10-26 (×15): qty 3

## 2024-10-26 MED ORDER — DILTIAZEM HCL ER COATED BEADS 180 MG PO CP24
180.0000 mg | ORAL_CAPSULE | Freq: Every day | ORAL | Status: DC
  Administered 2024-10-28 – 2024-10-29 (×2): 180 mg via ORAL
  Filled 2024-10-26 (×3): qty 1

## 2024-10-26 MED ORDER — TAMSULOSIN HCL 0.4 MG PO CAPS
0.4000 mg | ORAL_CAPSULE | Freq: Every day | ORAL | Status: DC
  Administered 2024-10-27 – 2024-11-04 (×9): 0.4 mg via ORAL
  Filled 2024-10-26 (×4): qty 1

## 2024-10-26 MED ORDER — AZITHROMYCIN 500 MG PO TABS: 500.0000 mg | ORAL_TABLET | Freq: Every day | ORAL | Status: DC
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
## 2024-10-26 NOTE — H&P (Signed)
 " History and Physical    [name] FMW:[id number] DOB: 03/15/1956 DOA: 10/26/2024  Referring MD/NP/PA:   PCP: [employer]   Patient coming from:  The patient is coming from home.     Chief Complaint: SOB, chest pain  HPI: Bryan Kelley is a 68 y.o. male with medical history significant of COPD on 3 L oxygen , A-fib/flutter on Eliquis , HTN, HLD, CAD, dCHF, BPH, former smoker, alcohol  abuse, anemia, who presents with SOB and chest pain.  Patient was recently hospitalized from 11/29 - 12/3 due to COPD exacerbation, pneumonia and A-fib with RVR. Pt completed her course of doxycycline  and Vantin  after discharge.  He states that his shortness breath has been progressively worsening in the past several days.  He has wheezing and cough with brownish sputum production.  No fever or chills.  He also reports chest pain, which is through the left side of chest, constant, 8 out of 10 in severity, nonradiating, pleuritic, aggravated by deep breaths.  No nausea, vomiting, diarrhea or abdominal pain.  No symptoms of UTI.   Patient is using 3 L oxygen  at baseline.  Per report, patient was found to have oxygen  desaturation to 88% on room air, with acute respiratory distress, using accessory muscle for breathing, with difficulty speaking in full sentence.  Initially started CPAP by EMS and given 500 cc LR bolus by EMS.  No significant improvement.  Patient refused BiPAP in ED.  Currently patient is 4 L of oxygen  with 100% of saturation in ED, still has acute respiratory distress.  Patient has tachycardic with heart rate up to 150s.  EKG showed A. Flutter with heart rate 151.  Patient was given 15 mg of IV Cardizem  without improvement, then started Cardizem  drip.  Data reviewed independently and ED Course: pt was found to have WBC 8.2, GFR> 60, BNP 952, troponin 18.  Temperature normal, blood pressure 133/98, RR 18--> 23.  Patient is admitted to PCU as inpatient.  Chest x-ray: 1. Partially  resolved airspace infiltrate in the left lower lung field and small left pleural effusion, both showing improvement. 2. COPD changes in the remaining lungs. No other evidence of acute chest process.   EKG: I have personally reviewed.  Seem to be in A. flutter, heart rate of 151, QTc 540, poor R wave progression.   Review of Systems:   General: no fevers, chills, no body weight gain, has fatigue HEENT: no blurry vision, hearing changes or sore throat Respiratory: has dyspnea, coughing, wheezing CV: has chest pain, no palpitations GI: no nausea, vomiting, abdominal pain, diarrhea, constipation GU: no dysuria, burning on urination, increased urinary frequency, hematuria  Ext: has trace leg edema Neuro: no unilateral weakness, numbness, or tingling, no vision change or hearing loss Skin: no rash, no skin tear. MSK: No muscle spasm, no deformity, no limitation of range of movement in spin Heme: No easy bruising.  Travel history: No recent long distant travel.   Allergy: Allergies[1]  Past Medical History:  Diagnosis Date   Acute bronchitis 05/07/2009   Qualifier: Diagnosis of   By: Antonetta MD, Margaret         Acute on chronic hypoxic respiratory failure (HCC) 10/06/2020   Acute on chronic respiratory failure with hypercapnia (HCC) 12/06/2019   Acute on chronic respiratory failure with hypoxia and hypercapnia (HCC) 07/28/2024   Acute respiratory disease due to COVID-19 virus 12/06/2019   Asthma    COPD (chronic obstructive pulmonary disease) (HCC)    COPD with  acute exacerbation (HCC) 10/29/2019   Depression    GERD (gastroesophageal reflux disease)    Inferior ST segment elevation 07/28/2024   Sinus tachycardia 07/30/2024   Vision loss, left eye     Past Surgical History:  Procedure Laterality Date   CARDIOVERSION N/A 08/16/2024   Procedure: CARDIOVERSION;  Surgeon: Darliss Rogue, MD;  Location: [facility] ORS;  Service: Cardiovascular;  Laterality: N/A;   CARDIOVERSION  N/A 09/16/2024   Procedure: CARDIOVERSION;  Surgeon: Argentina Clap, MD;  Location: [facility] ORS;  Service: Cardiovascular;  Laterality: N/A;   EYE SURGERY  2009, about [phone]   steel in left eye on the job, legally blind    LEFT HEART CATH AND CORONARY ANGIOGRAPHY N/A 07/28/2024   Procedure: LEFT HEART CATH AND CORONARY ANGIOGRAPHY;  Surgeon: Anner Alm ORN, MD;  Location: [facility] INVASIVE CV LAB;  Service: Cardiovascular;  Laterality: N/A;   TEE WITHOUT CARDIOVERSION N/A 08/16/2024   Procedure: ECHOCARDIOGRAM, TRANSESOPHAGEAL;  Surgeon: Darliss Rogue, MD;  Location: [facility] ORS;  Service: Cardiovascular;  Laterality: N/A;    Social History:  reports that he quit smoking about 2 years ago. His smoking use included cigarettes. He started smoking about 51 years ago. He has a 24.5 pack-year smoking history. He has never used smokeless tobacco. He reports current alcohol  use of about 2.0 standard drinks of alcohol  per week. He reports that he does not use drugs.  Family History:  Family History  Problem Relation Age of Onset   Diabetes Mother    Hypertension Mother    Stroke Mother    Asthma Father    Diabetes Father    Diabetes Sister    Hypertension Sister    Diabetes Brother    Hypertension Brother      Prior to Admission medications  Medication Sig Start Date End Date Taking? Authorizing Provider  acetaminophen  (TYLENOL ) 325 MG tablet Take 2 tablets (650 mg total) by mouth every 6 (six) hours as needed for mild pain, fever or headache. Patient not taking: Reported on 09/15/2024 07/05/23   Danton Reyes DASEN, MD  albuterol  (VENTOLIN  HFA) 108 [phone] Base) MCG/ACT inhaler Inhale 2 puffs into the lungs every 6 (six) hours as needed for wheezing or shortness of breath. 11/30/19   Edelmiro, [place], MD  amiodarone  (PACERONE ) 200 MG tablet Take 1 tablet (200 mg total) by mouth daily. 10/05/24 01/03/25  Pokhrel, Laxman, MD  apixaban  (ELIQUIS ) 5 MG TABS tablet Take 1 tablet (5 mg total) by mouth 2  (two) times daily. 08/02/24   Josette Ade, MD  atorvastatin  (LIPITOR ) 80 MG tablet Take 1 tablet (80 mg total) by mouth daily. 08/02/24   Josette Ade, MD  brimonidine  (ALPHAGAN ) 0.2 % ophthalmic solution Place 1 drop into the left eye 2 (two) times daily. 04/05/24   [provider]  budesonide -formoterol  (SYMBICORT ) 80-4.5 MCG/ACT inhaler Inhale 2 puffs into the lungs 2 (two) times daily. 10/05/24   Pokhrel, Laxman, MD  diltiazem  (CARDIZEM  CD) 180 MG 24 hr capsule Take 1 capsule (180 mg total) by mouth daily. 10/05/24 01/03/25  Pokhrel, Laxman, MD  ipratropium-albuterol  (DUONEB) 0.5-2.5 (3) MG/3ML SOLN Take 3 mLs by nebulization 4 (four) times daily as needed. 06/03/24   [provider]  lidocaine  (LIDODERM ) 5 % Place 1 patch onto the skin every 12 (twelve) hours. Remove & Discard patch within 12 hours or as directed by MD.  Ginnie the patch off for 12 hours before applying a new one. Patient not taking: Reported on 09/15/2024 08/29/24 08/29/25  Gordan Huxley, MD  montelukast  (SINGULAIR ) 10 MG tablet Take 1 tablet (10 mg total) by mouth at bedtime. 08/02/24 09/01/24  Josette Ade, MD  pantoprazole  (PROTONIX ) 20 MG tablet Take 1 tablet (20 mg total) by mouth daily. 10/05/24   Pokhrel, Laxman, MD  polyethylene glycol (MIRALAX  / GLYCOLAX ) 17 g packet Take 17 g by mouth 2 (two) times daily. 08/02/24   Josette Ade, MD  sildenafil (VIAGRA) 100 MG tablet Take 100 mg by mouth as needed for erectile dysfunction. 07/26/24   [provider]  tamsulosin  (FLOMAX ) 0.4 MG CAPS capsule Take 1 capsule (0.4 mg total) by mouth daily. 07/05/23   Danton Reyes DASEN, MD    Physical Exam: Vitals:   10/26/24 2053 10/26/24 2222 10/26/24 2238 10/26/24 2247  BP:  (!) 130/93    Pulse:  (!) 147 (!) 149 (!) 150  Resp:  (!) 23 19 14   Temp:      TempSrc:      SpO2:  100% 100% 100%  Weight: 77.1 kg     Height: 5' 6 (1.676 m)      General: Has acute respiratory distress. HEENT:       Eyes:  PERRL, EOMI, no jaundice       ENT: No discharge from the ears and nose, no pharynx injection, no tonsillar enlargement.        Neck: No JVD, no bruit, no mass felt. Heme: No neck lymph node enlargement. Cardiac: S1/S2, RRR, No murmurs, No gallops or rubs. Respiratory: Has wheezing bilaterally GI: Soft, nondistended, nontender, no rebound pain, no organomegaly, BS present. GU: No hematuria Ext: Has trace leg edema bilaterally. 1+DP/PT pulse bilaterally. Musculoskeletal: No joint deformities, No joint redness or warmth, no limitation of ROM in spin. Skin: No rashes.  Neuro: Alert, oriented X3, cranial nerves II-XII grossly intact, moves all extremities normally.  Psych: Patient is not psychotic, no suicidal or hemocidal ideation.  Labs on Admission: I have personally reviewed following labs and imaging studies  CBC: Recent Labs  Lab 10/26/24 2100  WBC 8.2  HGB 10.9*  HCT 34.6*  MCV 88.3  PLT 316   Basic Metabolic Panel: Recent Labs  Lab 10/26/24 2100  NA 137  K 3.7  CL 95*  CO2 33*  GLUCOSE 144*  BUN 11  CREATININE 0.76  CALCIUM  8.7*   GFR: Estimated Creatinine Clearance: 87.6 mL/min (by C-G formula based on SCr of 0.76 mg/dL). Liver Function Tests: No results for input(s): AST, ALT, ALKPHOS, BILITOT, PROT, ALBUMIN in the last 168 hours. No results for input(s): LIPASE, AMYLASE in the last 168 hours. No results for input(s): AMMONIA in the last 168 hours. Coagulation Profile: No results for input(s): INR, PROTIME in the last 168 hours. Cardiac Enzymes: No results for input(s): CKTOTAL, CKMB, CKMBINDEX, TROPONINI in the last 168 hours. BNP (last 3 results) Recent Labs    10/26/24 2100  PROBNP 952.0*   HbA1C: No results for input(s): HGBA1C in the last 72 hours. CBG: No results for input(s): GLUCAP in the last 168 hours. Lipid Profile: No results for input(s): CHOL, HDL, LDLCALC, TRIG, CHOLHDL, LDLDIRECT in the  last 72 hours. Thyroid  Function Tests: No results for input(s): TSH, T4TOTAL, FREET4, T3FREE, THYROIDAB in the last 72 hours. Anemia Panel: No results for input(s): VITAMINB12, FOLATE, FERRITIN, TIBC, IRON, RETICCTPCT in the last 72 hours. Urine analysis: No results found for: COLORURINE, APPEARANCEUR, LABSPEC, PHURINE, GLUCOSEU, HGBUR, BILIRUBINUR, KETONESUR, PROTEINUR, UROBILINOGEN, NITRITE, LEUKOCYTESUR Sepsis Labs: @LABRCNTIP (procalcitonin:4,lacticidven:4) )No results found for this or any previous visit (from  the past 240 hours).   Radiological Exams on Admission:   Assessment/Plan Active Problems:   COPD exacerbation (HCC)   Acute on chronic respiratory failure with hypoxia (HCC)   Pleuritic chest pain   Chronic diastolic CHF (congestive heart failure) (HCC)   Atrial flutter with rapid ventricular response (HCC)   Essential hypertension   CAD , nonobstructive on cath 07/28/2024   HLD (hyperlipidemia)   Normocytic anemia   BPH (benign prostatic hyperplasia)   Alcohol  abuse   Overweight (BMI 25.0-29.9)   Assessment and Plan:   Acute on chronic respiratory failure with hypoxia due to COPD exacerbation: Patient has acute respiratory distress, with increased oxygen  requirement from 3 L to 4 L.  Patient refused BiPAP.  Has wheezing on auscultation, indicating COPD exacerbation.  Also reports of pleuritic chest pain, need to rule out PE.  -will admit patient to PCU as inpt -Bronchodilators, Singulair  and prn Mucinex  -Solu-Medrol  80 mg IV daily after given 125 mg of Solu-Medrol  -Oral doxycycline  100 mg twice daily (has QT prolongation, cannot use Z-Pak) -Incentive spirometry -check RVP -Check RSEP panel -Follow up sputum culture  Pleuritic chest pain: Troponin 18.  Need to rule out PE - prnTylenol, Percocet for pain - Follow-up CTA to rule out PE  Chronic diastolic CHF (congestive heart failure) (HCC): 2D echo on 08/16/2024  showed EF of 60 to 85%. Has trace leg edema, with mildly elevated BNP 952, does not seem to have CHF exacerbation. -will not give IV Lasix  since patient needs Cardizem  drip,  need to avoid hypotension  Atrial flutter with rapid ventricular response (HCC): Likely triggered by COPD exacerbation. - Continue Cardizem  drip - Give 2.5 mg of IV metoprolol  IV metoprolol  - Continue home amiodarone  200 mg daily - Continue home Cardizem  180 mg daily tomorrow morning - check Mg level - give 40 mEq of K[place] (his K is 3.7).  Essential hypertension -On Cardizem  drip - Continue oral Cardizem  as above - IV hydralazine  as needed  CAD , nonobstructive on cath 07/28/2024: Patient has pleuritic chest pain.  Troponin is 18. - Lipitor  - Patient is on Eliquis  - Follow-up CTA to rule out PE  HLD (hyperlipidemia) -Lipitor   Normocytic anemia: Hemoglobin stable.  10.9 (11.1 on 10/05/2024). -Followed by CBC  BPH (benign prostatic hyperplasia) -Continue Flomax   Alcohol  abuse: Patient states that he did not drink alcohol  for more than 1 week.  No signs for withdrawal currently. -Observe closely for any signs of withdrawal - Give folic acid  and vitamin B1  Overweight (BMI 25.0-29.9): Body weight 77.1 kg, BMI 27.44. - Encourage losing weight - Exercise healthy diet      DVT ppx: on Eliquis   Code Status: Full code  Family Communication:     not done, no family member is at bed side.  Disposition Plan:  Anticipate discharge back to previous environment  Consults called:  none  Admission status and Level of care: Progressive:  as inpt        Dispo: The patient is from: Home              Anticipated d/c is to: Home              Anticipated d/c date is: 2 days              Patient currently is not medically stable to d/c.    Severity of Illness:  The appropriate patient status for this patient is INPATIENT. Inpatient status is judged to be reasonable and necessary in  order to provide the  required intensity of service to ensure the patient's safety. The patient's presenting symptoms, physical exam findings, and initial radiographic and laboratory data in the context of their chronic comorbidities is felt to place them at high risk for further clinical deterioration. Furthermore, it is not anticipated that the patient will be medically stable for discharge from the hospital within 2 midnights of admission.   * I certify that at the point of admission it is my clinical judgment that the patient will require inpatient hospital care spanning beyond 2 midnights from the point of admission due to high intensity of service, high risk for further deterioration and high frequency of surveillance required.*       Date of Service 10/26/2024    Caleb Exon [facility]   If 7PM-7AM, please contact night-coverage [email or web address] 10/26/2024, 11:12 PM     [1]  Allergies Allergen Reactions   Aspirin    "

## 2024-10-26 NOTE — ED Triage Notes (Signed)
 BIBEMS from home. Pt c/o SOB that has been going on for 2 days. On EMS arrival pt was using accessory muscles to breath and had auditory wheezing. Pt was 88% on RA. EMS put pt on CPAP and gave them a DuoNeb, pt came up to 100%. Pt given 500 LR bolus by EMS. Hx of COPD and Afib, wears 3L [place] chronically.   EMS VS: 120/76, 100% on CPAP, 150 HR

## 2024-10-27 DIAGNOSIS — I483 Typical atrial flutter: Secondary | ICD-10-CM | POA: Diagnosis not present

## 2024-10-27 DIAGNOSIS — I4891 Unspecified atrial fibrillation: Secondary | ICD-10-CM | POA: Diagnosis not present

## 2024-10-27 DIAGNOSIS — J9601 Acute respiratory failure with hypoxia: Secondary | ICD-10-CM

## 2024-10-27 LAB — CBC
HCT: 33.6 % — ABNORMAL LOW (ref 39.0–52.0)
Hemoglobin: 10.5 g/dL — ABNORMAL LOW (ref 13.0–17.0)
MCH: 28.1 pg (ref 26.0–34.0)
MCHC: 31.3 g/dL (ref 30.0–36.0)
MCV: 89.8 fL (ref 80.0–100.0)
Platelets: 311 K/uL (ref 150–400)
RBC: 3.74 MIL/uL — ABNORMAL LOW (ref 4.22–5.81)
RDW: 13.8 % (ref 11.5–15.5)
WBC: 7.7 K/uL (ref 4.0–10.5)
nRBC: 0 % (ref 0.0–0.2)

## 2024-10-27 LAB — BASIC METABOLIC PANEL WITH GFR
Anion gap: 10 (ref 5–15)
BUN: 13 mg/dL (ref 8–23)
CO2: 32 mmol/L (ref 22–32)
Calcium: 8.9 mg/dL (ref 8.9–10.3)
Chloride: 96 mmol/L — ABNORMAL LOW (ref 98–111)
Creatinine, Ser: 0.74 mg/dL (ref 0.61–1.24)
GFR, Estimated: >60 mL/min
Glucose, Bld: 315 mg/dL — ABNORMAL HIGH (ref 70–99)
Potassium: 4.7 mmol/L (ref 3.5–5.1)
Sodium: 137 mmol/L (ref 135–145)

## 2024-10-27 LAB — RESP PANEL BY RT-PCR (RSV, FLU A&B, COVID)  RVPGX2
Influenza A by PCR: NEGATIVE
Influenza B by PCR: NEGATIVE
Resp Syncytial Virus by PCR: NEGATIVE
SARS Coronavirus 2 by RT PCR: NEGATIVE

## 2024-10-27 LAB — EXPECTORATED SPUTUM ASSESSMENT W GRAM STAIN, RFLX TO RESP C

## 2024-10-27 MED ORDER — ORAL CARE MOUTH RINSE: 15.0000 mL | OROMUCOSAL | Status: DC | PRN

## 2024-10-27 MED ORDER — BRIMONIDINE TARTRATE 0.2 % OP SOLN
1.0000 [drp] | Freq: Two times a day (BID) | OPHTHALMIC | Status: DC
  Administered 2024-10-28 – 2024-11-04 (×15): 1 [drp] via OPHTHALMIC
  Filled 2024-10-27: qty 5

## 2024-10-27 MED ORDER — METOPROLOL TARTRATE 5 MG/5ML IV SOLN
5.0000 mg | Freq: Four times a day (QID) | INTRAVENOUS | Status: DC | PRN
  Administered 2024-10-27 – 2024-10-28 (×3): 5 mg via INTRAVENOUS
  Filled 2024-10-27 (×3): qty 5

## 2024-10-27 MED ORDER — FUROSEMIDE 10 MG/ML IJ SOLN
40.0000 mg | Freq: Every day | INTRAMUSCULAR | Status: DC
  Administered 2024-10-27 – 2024-10-28 (×2): 40 mg via INTRAVENOUS
  Filled 2024-10-27 (×2): qty 4

## 2024-10-27 MED ORDER — PREDNISONE 20 MG PO TABS
40.0000 mg | ORAL_TABLET | Freq: Every day | ORAL | Status: AC
  Administered 2024-10-28 – 2024-11-01 (×5): 40 mg via ORAL
  Filled 2024-10-27 (×3): qty 2

## 2024-10-27 NOTE — Progress Notes (Signed)
 " Progress Note   Patient: Bryan Kelley FMW:[id number] DOB: Nov 04, 1955 DOA: 10/26/2024     1 DOS: the patient was seen and examined on 10/27/2024   Brief hospital course: Bryan Kelley is a 68 y.o. male with medical history significant of COPD on 3 L oxygen , A-fib/flutter on Eliquis , HTN, HLD, CAD, dCHF, BPH, former smoker, alcohol  abuse, anemia, who presents with SOB and chest pain. Patient was recently hospitalized from 11/29 - 12/3 due to COPD exacerbation, pneumonia and A-fib with RVR. Pt completed her course of doxycycline  and Vantin  after discharge.  In the emergency room patient was found to have A-fib with RVR with rates up to 150s.      Assessment and Plan:  Acute on chronic respiratory failure with hypoxia due to COPD exacerbation:  Patient has acute respiratory distress, with increased oxygen  requirement from 3 L to 4 L.   Patient refused BiPAP.   Continue as needed nebulization Wean off steroid as tolerated Continue doxycycline    Pleuritic chest pain:  Troponin 18.  Need to rule out PE - prnTylenol, Percocet for pain - Follow-up CTA to rule out PE   Chronic diastolic CHF (congestive heart failure) (HCC): 2D echo on 08/16/2024 showed EF of 60 to 85%. Has trace leg edema, with mildly elevated BNP 952, does not seem to have CHF exacerbation. Continue Lasix  Continue to monitor input and output   Atrial flutter with rapid ventricular response (HCC): Likely triggered by COPD exacerbation. Being weaned off Cardizem  drip - Give 2.5 mg of IV metoprolol  IV metoprolol  - Continue home amiodarone  200 mg daily - Continue home Cardizem  180 mg daily  - check Mg level - give 40 mEq of Kure (his K is 3.7). - continue Elqiuis   Essential hypertension -On Cardizem  drip - Continue oral Cardizem  as above - IV hydralazine  as needed   CAD , nonobstructive on cath 07/28/2024: Patient has pleuritic chest pain.  Troponin is 18. - Lipitor  - Patient is on Eliquis  - Follow-up CTA to rule out  PE   HLD (hyperlipidemia) -Lipitor    Normocytic anemia: Hemoglobin stable.  10.9 (11.1 on 10/05/2024). -Followed by CBC   BPH (benign prostatic hyperplasia) -Continue Flomax    Alcohol  abuse:  Monitor for withdrawal Continue folic acid  and vitamin B-1   Overweight (BMI 25.0-29.9): Body weight 77.1 kg, BMI 27.44. - Encourage losing weight - Exercise healthy diet   DVT ppx: on Eliquis    Code Status: Full code   Family Communication:     not done, no family member is at bed side.   Disposition Plan:  Anticipate discharge back to previous environment   Consults called:  none   Subjective:  Patient seen and examined at bedside this morning Denies chest pain, nausea vomiting Currently on diltiazem  drip  Physical Exam: General: Has acute respiratory distress. Cardiac: S1/S2, RRR, No murmurs, No gallops or rubs. Respiratory: Has wheezing bilaterally GI: Soft, nondistended, nontender GU: No hematuria Ext: Has trace leg edema bilaterally.  Musculoskeletal: No joint deformities, No joint redness or warmth, no limitation of ROM in spin. Skin: No rashes.  Neuro: Alert, oriented X3, cranial nerves II-XII grossly intact, moves all extremities normally.  Psych: Patient is not psychotic, no suicidal or hemocidal ideation.    Vitals:   10/27/24 1508 10/27/24 1509 10/27/24 1528 10/27/24 1600  BP:    121/76  Pulse: 91 93  91  Resp:      Temp:      TempSrc:      SpO2:  99% 99% 100% 100%  Weight:      Height:        Data Reviewed: CT angio did not show any pulmonary embolism    Latest Ref Rng & Units 10/27/2024    5:26 AM 10/26/2024    9:00 PM 10/05/2024    4:37 AM  CBC  WBC 4.0 - 10.5 K/uL 7.7  8.2  11.7   Hemoglobin 13.0 - 17.0 g/dL 89.4  89.0  88.8   Hematocrit 39.0 - 52.0 % 33.6  34.6  35.1   Platelets 150 - 400 K/uL 311  316  337        Latest Ref Rng & Units 10/27/2024    5:26 AM 10/26/2024    9:00 PM 10/01/2024    4:00 PM  BMP  Glucose 70 - 99 mg/dL 684   855  889   BUN 8 - 23 mg/dL 13  11  12    Creatinine 0.61 - 1.24 mg/dL 9.25  9.23  9.29   Sodium 135 - 145 mmol/L 137  137  138   Potassium 3.5 - 5.1 mmol/L 4.7  3.7  4.9   Chloride 98 - 111 mmol/L 96  95  99   CO2 22 - 32 mmol/L 32  33  30   Calcium  8.9 - 10.3 mg/dL 8.9  8.7  9.0      Author: Drue ONEIDA Potter, MD 10/27/2024 4:11 PM  For on call review [email or web address].  "

## 2024-10-27 NOTE — Progress Notes (Signed)
" °   10/27/24 1900  Assess: MEWS Score  Temp 97.8 F (36.6 C)  BP 119/85  MAP (mmHg) 94  Pulse Rate (!) 136  ECG Heart Rate (!) 136  Resp 17  Level of Consciousness Alert  SpO2 100 %  O2 Device Nasal Cannula  O2 Flow Rate (L/min) 4 L/min  Assess: MEWS Score  MEWS Temp 0  MEWS Systolic 0  MEWS Pulse 3  MEWS RR 0  MEWS LOC 0  MEWS Score 3  MEWS Score Color Yellow  Assess: if the MEWS score is Yellow or Red  Were vital signs accurate and taken at a resting state? Yes  Does the patient meet 2 or more of the SIRS criteria? No  MEWS guidelines implemented  Yes, yellow  Treat  MEWS Interventions Considered administering scheduled or prn medications/treatments as ordered  Take Vital Signs  Increase Vital Sign Frequency  Yellow: Q2hr x1, continue Q4hrs until patient remains green for 12hrs  Escalate  MEWS: Escalate Yellow: Discuss with charge nurse and consider notifying provider and/or RRT  Notify: Charge Nurse/RN  Name of Charge Nurse/RN Notified Brittany, RN  Provider Notification  Provider Name/Title Dr. Lawence  Date Provider Notified 10/27/24  Time Provider Notified 1919  Method of Notification Virtual Face-to-face (Secure chat)  Notification Reason Other (Comment) (MEWS)  Provider response No new orders  Date of Provider Response 10/27/24  Time of Provider Response 1920  Assess: SIRS CRITERIA  SIRS Temperature  0  SIRS Respirations  0  SIRS Pulse 1  SIRS WBC 0  SIRS Score Sum  1    "

## 2024-10-27 NOTE — Plan of Care (Signed)

## 2024-10-27 NOTE — Plan of Care (Signed)

## 2024-10-27 NOTE — Plan of Care (Addendum)
 This patient remains on [facility]2A as of time of writing. The patient remains on 4 L / min of supplemental O2. The patient remains on Diltiazem  infusion overnight. Isolation orders clarified with Dr. Lawence overnight. IV tubing changed overnight per protocol. The patient's admission profile is completed, as much as possible, by this RN overnight also. Yellow MEWS overnight; see my other note entry for details. Adult oral care protocol initiated overnight by this RN.    Problem: Education: Goal: Knowledge of General Education information will improve Description: Including pain rating scale, medication(s)/side effects and non-pharmacologic comfort measures Outcome: Progressing   Problem: Health Behavior/Discharge Planning: Goal: Ability to manage health-related needs will improve Outcome: Progressing   Problem: Clinical Measurements: Goal: Ability to maintain clinical measurements within normal limits will improve Outcome: Progressing Goal: Will remain free from infection Outcome: Progressing Goal: Diagnostic test results will improve Outcome: Progressing Goal: Respiratory complications will improve Outcome: Progressing Goal: Cardiovascular complication will be avoided Outcome: Progressing   Problem: Activity: Goal: Risk for activity intolerance will decrease Outcome: Progressing   Problem: Nutrition: Goal: Adequate nutrition will be maintained Outcome: Progressing   Problem: Coping: Goal: Level of anxiety will decrease Outcome: Progressing   Problem: Elimination: Goal: Will not experience complications related to bowel motility Outcome: Progressing Goal: Will not experience complications related to urinary retention Outcome: Progressing   Problem: Pain Managment: Goal: General experience of comfort will improve and/or be controlled Outcome: Progressing   Problem: Safety: Goal: Ability to remain free from injury will improve Outcome: Progressing   Problem: Skin  Integrity: Goal: Risk for impaired skin integrity will decrease Outcome: Progressing   Problem: Education: Goal: Knowledge of disease or condition will improve Outcome: Progressing Goal: Knowledge of the prescribed therapeutic regimen will improve Outcome: Progressing Goal: Individualized Educational Video(s) Outcome: Progressing   Problem: Activity: Goal: Ability to tolerate increased activity will improve Outcome: Progressing Goal: Will verbalize the importance of balancing activity with adequate rest periods Outcome: Progressing   Problem: Respiratory: Goal: Ability to maintain a clear airway will improve Outcome: Progressing Goal: Levels of oxygenation will improve Outcome: Progressing Goal: Ability to maintain adequate ventilation will improve Outcome: Progressing   Problem: Education: Goal: Knowledge of disease or condition will improve Outcome: Progressing Goal: Understanding of medication regimen will improve Outcome: Progressing Goal: Individualized Educational Video(s) Outcome: Progressing   Problem: Activity: Goal: Ability to tolerate increased activity will improve Outcome: Progressing   Problem: Cardiac: Goal: Ability to achieve and maintain adequate cardiopulmonary perfusion will improve Outcome: Progressing   Problem: Health Behavior/Discharge Planning: Goal: Ability to safely manage health-related needs after discharge will improve Outcome: Progressing   Problem: Education: Goal: Ability to demonstrate management of disease process will improve Outcome: Progressing Goal: Ability to verbalize understanding of medication therapies will improve Outcome: Progressing Goal: Individualized Educational Video(s) Outcome: Progressing   Problem: Activity: Goal: Capacity to carry out activities will improve Outcome: Progressing   Problem: Cardiac: Goal: Ability to achieve and maintain adequate cardiopulmonary perfusion will improve Outcome:  Progressing

## 2024-10-28 ENCOUNTER — Encounter: Payer: Self-pay | Admitting: Internal Medicine

## 2024-10-28 DIAGNOSIS — I4891 Unspecified atrial fibrillation: Secondary | ICD-10-CM | POA: Diagnosis not present

## 2024-10-28 DIAGNOSIS — I4892 Unspecified atrial flutter: Secondary | ICD-10-CM | POA: Diagnosis not present

## 2024-10-28 DIAGNOSIS — J449 Chronic obstructive pulmonary disease, unspecified: Secondary | ICD-10-CM | POA: Diagnosis not present

## 2024-10-28 DIAGNOSIS — I5033 Acute on chronic diastolic (congestive) heart failure: Secondary | ICD-10-CM | POA: Diagnosis not present

## 2024-10-28 DIAGNOSIS — I2510 Atherosclerotic heart disease of native coronary artery without angina pectoris: Secondary | ICD-10-CM | POA: Diagnosis not present

## 2024-10-28 LAB — HEMOGLOBIN A1C
Hgb A1c MFr Bld: 7 % — ABNORMAL HIGH (ref 4.8–5.6)
Mean Plasma Glucose: 154.2 mg/dL

## 2024-10-28 LAB — GLUCOSE, CAPILLARY
Glucose-Capillary: 297 mg/dL — ABNORMAL HIGH (ref 70–99)
Glucose-Capillary: 314 mg/dL — ABNORMAL HIGH (ref 70–99)

## 2024-10-28 MED ORDER — INSULIN ASPART 100 UNIT/ML IJ SOLN
0.0000 [IU] | Freq: Every day | INTRAMUSCULAR | Status: DC
  Administered 2024-10-28: 4 [IU] via SUBCUTANEOUS
  Administered 2024-10-29 – 2024-10-30 (×2): 3 [IU] via SUBCUTANEOUS
  Administered 2024-10-31 – 2024-11-01 (×2): 2 [IU] via SUBCUTANEOUS
  Filled 2024-10-28: qty 3
  Filled 2024-10-28: qty 4
  Filled 2024-10-28: qty 5

## 2024-10-28 MED ORDER — AMIODARONE HCL 200 MG PO TABS
400.0000 mg | ORAL_TABLET | Freq: Two times a day (BID) | ORAL | Status: DC
  Administered 2024-10-28 – 2024-11-01 (×8): 400 mg via ORAL
  Filled 2024-10-28 (×5): qty 2

## 2024-10-28 MED ORDER — BRIMONIDINE TARTRATE 0.2 % OP SOLN: 1.0000 [drp] | Freq: Two times a day (BID) | OPHTHALMIC | Status: DC

## 2024-10-28 MED ORDER — FUROSEMIDE 40 MG PO TABS
40.0000 mg | ORAL_TABLET | Freq: Every day | ORAL | Status: DC
  Administered 2024-10-29 – 2024-11-04 (×7): 40 mg via ORAL
  Filled 2024-10-28 (×2): qty 1

## 2024-10-28 MED ORDER — BRINZOLAMIDE 1 % OP SUSP
2.0000 [drp] | Freq: Two times a day (BID) | OPHTHALMIC | Status: DC
  Administered 2024-10-28 – 2024-11-04 (×15): 2 [drp] via OPHTHALMIC
  Filled 2024-10-28: qty 10

## 2024-10-28 MED ORDER — INSULIN ASPART 100 UNIT/ML IJ SOLN
0.0000 [IU] | Freq: Three times a day (TID) | INTRAMUSCULAR | Status: DC
  Administered 2024-10-28: 5 [IU] via SUBCUTANEOUS
  Administered 2024-10-29 (×3): 3 [IU] via SUBCUTANEOUS
  Administered 2024-10-30 (×2): 2 [IU] via SUBCUTANEOUS
  Administered 2024-10-30: 5 [IU] via SUBCUTANEOUS
  Administered 2024-10-31: 9 [IU] via SUBCUTANEOUS
  Administered 2024-11-01: 1 [IU] via SUBCUTANEOUS
  Administered 2024-11-01: 2 [IU] via SUBCUTANEOUS
  Administered 2024-11-01: 5 [IU] via SUBCUTANEOUS
  Administered 2024-11-02: 1 [IU] via SUBCUTANEOUS
  Administered 2024-11-02: 3 [IU] via SUBCUTANEOUS
  Administered 2024-11-03: 1 [IU] via SUBCUTANEOUS
  Administered 2024-11-03 – 2024-11-04 (×2): 2 [IU] via SUBCUTANEOUS
  Filled 2024-10-28 (×2): qty 5
  Filled 2024-10-28 (×2): qty 2
  Filled 2024-10-28 (×3): qty 3

## 2024-10-28 MED ORDER — BRINZOLAMIDE 1 % OP SUSP: 2.0000 [drp] | Freq: Two times a day (BID) | OPHTHALMIC | Status: DC

## 2024-10-28 MED ORDER — POLYVINYL ALCOHOL 1.4 % OP SOLN
1.0000 [drp] | Freq: Two times a day (BID) | OPHTHALMIC | Status: DC
  Administered 2024-10-28 – 2024-11-04 (×15): 1 [drp] via OPHTHALMIC
  Filled 2024-10-28: qty 15

## 2024-10-28 NOTE — Progress Notes (Signed)
" °   10/28/24 0530  Assess: MEWS Score  Temp 97.7 F (36.5 C)  BP 114/73  MAP (mmHg) 86  Pulse Rate (!) 135  ECG Heart Rate (!) 136  Resp 18  Level of Consciousness Alert  SpO2 99 %  O2 Device Nasal Cannula  O2 Flow Rate (L/min) 4 L/min  Assess: MEWS Score  MEWS Temp 0  MEWS Systolic 0  MEWS Pulse 3  MEWS RR 0  MEWS LOC 0  MEWS Score 3  MEWS Score Color Yellow  Assess: if the MEWS score is Yellow or Red  Were vital signs accurate and taken at a resting state? Yes  Does the patient meet 2 or more of the SIRS criteria? No  MEWS guidelines implemented  Yes, yellow  Treat  MEWS Interventions Considered administering scheduled or prn medications/treatments as ordered  Take Vital Signs  Increase Vital Sign Frequency  Yellow: Q2hr x1, continue Q4hrs until patient remains green for 12hrs  Escalate  MEWS: Escalate Yellow: Discuss with charge nurse and consider notifying provider and/or RRT  Notify: Charge Nurse/RN  Name of Charge Nurse/RN Notified Brittany, RN  Provider Notification  Provider Name/Title Dr. Lawence  Date Provider Notified 10/28/24  Time Provider Notified [phone]  Method of Notification  (secure chat)  Notification Reason Other (Comment) (MEWS)  Provider response No new orders  Date of Provider Response 10/28/24  Time of Provider Response 0600  Assess: SIRS CRITERIA  SIRS Temperature  0  SIRS Respirations  0  SIRS Pulse 1  SIRS WBC 0  SIRS Score Sum  1    "

## 2024-10-28 NOTE — Progress Notes (Signed)
 This pt's HR sustaining in the 130-140's for the past ~ 15 mins. Given PRN metoprolol  due to order parameters met. MD Djan arrived to bedside and made aware.

## 2024-10-28 NOTE — Plan of Care (Signed)
?  Problem: Nutrition: Goal: Adequate nutrition will be maintained Outcome: Progressing   Problem: Elimination: Goal: Will not experience complications related to urinary retention Outcome: Progressing   Problem: Safety: Goal: Ability to remain free from injury will improve Outcome: Progressing   

## 2024-10-28 NOTE — Progress Notes (Signed)
 " Progress Note   Patient: Bryan Kelley FMW:[id number] DOB: 1956-06-02 DOA: 10/26/2024     2 DOS: the patient was seen and examined on 10/28/2024     Brief hospital course: Bryan Kelley is a 68 y.o. male with medical history significant of COPD on 3 L oxygen , A-fib/flutter on Eliquis , HTN, HLD, CAD, dCHF, BPH, former smoker, alcohol  abuse, anemia, who presents with SOB and chest pain. Patient was recently hospitalized from 11/29 - 12/3 due to COPD exacerbation, pneumonia and A-fib with RVR. Pt completed her course of doxycycline  and Vantin  after discharge.  In the emergency room patient was found to have A-fib with RVR with rates up to 150s.      Assessment and Plan:  Acute on chronic respiratory failure with hypoxia due to COPD exacerbation:  Patient has acute respiratory distress, with increased oxygen  requirement from 3 L to 4 L.   Patient refused BiPAP.   Continue as needed nebulization Wean off steroid as tolerated Continue doxycycline    Pleuritic chest pain:  CTA did not show PE   Chronic diastolic CHF (congestive heart failure) (HCC): 2D echo on 08/16/2024 showed EF of 60 to 85%. Has trace leg edema, with mildly elevated BNP 952, does not seem to have CHF exacerbation. Continue Lasix  Continue to monitor input and output Cardiology is on board and case discussed   Atrial flutter with rapid ventricular response (HCC): Likely triggered by COPD exacerbation. Still on Cardizem  drip Continue amiodarone  as recommended by cardiologist Continue Eliquis  Plan of care discussed with cardiologist and they are considering cardioversion on Monday after improvement in respiratory function   Essential hypertension -On Cardizem  drip Continue Lasix    CAD , nonobstructive on cath 07/28/2024: Patient has pleuritic chest pain.  Troponin is 18. Continue statin therapy, patient also on Eliquis    HLD (hyperlipidemia) -Lipitor    Normocytic anemia: Hemoglobin stable.  Monitor CBC closely    BPH (benign prostatic hyperplasia) -Continue Flomax    Alcohol  abuse:  Monitor for withdrawal Continue folic acid  and vitamin B-1   Overweight (BMI 25.0-29.9): BMI 27.6 Counseled on weight loss with diet and exercise when medically stable   Prediabetes with hyperglycemia Patient's last A1c 3 months ago was 5.9 Repeat A1c Will add supportive insulin  given hyperglycemia Monitor glucose level closely  DVT ppx: on Eliquis    Code Status: Full code   Family Communication: Discussed with patient's sister, Bryan Kelley over the phone   Disposition Plan:  Anticipate discharge back to previous environment   Consults called:  none     Subjective:  Patient seen and examined at bedside this morning Heart rates have been very difficult to control I have consulted cardiologist as patient may benefit from cardioversion  Physical Exam: General: Has acute respiratory distress. Cardiac: S1/S2, RRR, No murmurs, No gallops or rubs. Respiratory: Has wheezing bilaterally GI: Soft, nondistended, nontender GU: No hematuria Ext: Has trace leg edema bilaterally.  Musculoskeletal: No joint deformities, No joint redness or warmth, no limitation of ROM in spin. Skin: No rashes.  Neuro: Alert, oriented X3, cranial nerves II-XII grossly intact, moves all extremities normally.  Psych: Patient is not psychotic, no suicidal or hemocidal ideation.     Data Reviewed:     Latest Ref Rng & Units 10/27/2024    5:26 AM 10/26/2024    9:00 PM 10/05/2024    4:37 AM  CBC  WBC 4.0 - 10.5 K/uL 7.7  8.2  11.7   Hemoglobin 13.0 - 17.0 g/dL 89.4  89.0  88.8  Hematocrit 39.0 - 52.0 % 33.6  34.6  35.1   Platelets 150 - 400 K/uL 311  316  337        Latest Ref Rng & Units 10/27/2024    5:26 AM 10/26/2024    9:00 PM 10/01/2024    4:00 PM  BMP  Glucose 70 - 99 mg/dL 684  855  889   BUN 8 - 23 mg/dL 13  11  12    Creatinine 0.61 - 1.24 mg/dL 9.25  9.23  9.29   Sodium 135 - 145 mmol/L 137  137  138   Potassium  3.5 - 5.1 mmol/L 4.7  3.7  4.9   Chloride 98 - 111 mmol/L 96  95  99   CO2 22 - 32 mmol/L 32  33  30   Calcium  8.9 - 10.3 mg/dL 8.9  8.7  9.0     Vitals:   10/28/24 0900 10/28/24 1000 10/28/24 1200 10/28/24 1224  BP: (!) 134/115 133/83  101/85  Pulse: (!) 114 (!) 116 (!) 141 (!) 140  Resp:    20  Temp:      TempSrc:      SpO2: 97% 98%  99%  Weight:      Height:        Author: Drue ONEIDA Potter, MD 10/28/2024 3:34 PM  For on call review [email or web address]  "
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
## 2024-10-28 NOTE — TOC Initial Note (Addendum)
 Transition of Care [facility]) - Initial/Assessment Note    Patient Details  Name: Bryan Kelley MRN: [id number] Date of Birth: [birth year]  [facility] CM/SW Contact:    Shasta DELENA Daring, RN Phone Number: 10/28/2024, 11:01 AM  Clinical Narrative:                  RNCM assessed patient. He was alone in room. Unidentified family member on speaker phone. Patient lives with XXX. Confirmed PCP service provided with [facility]. Sister provides transportation to appointments or patient will call transportation service. Uses [facility] in [place] for pharmacy services. Denies difficulty affording medication. Not currently enrolled with HH. Has no DME in the home.  Will call sister for ride home at discharge.   Will follow for chronic O2 use.       Patient Goals and CMS Choice            Expected Discharge Plan and Services                                              Prior Living Arrangements/Services                       Activities of Daily Living   ADL Screening (condition at time of admission) Independently performs ADLs?: Yes (appropriate for developmental age) Is the patient deaf or have difficulty hearing?: No Does the patient have difficulty seeing, even when wearing glasses/contacts?: Yes Does the patient have difficulty concentrating, remembering, or making decisions?: No  Permission Sought/Granted                  Emotional Assessment              Admission diagnosis:  COPD exacerbation (HCC) [J44.1] Atrial fibrillation with RVR (HCC) [I48.91] Patient Active Problem List   Diagnosis Date Noted   COPD exacerbation (HCC) 10/26/2024   Chronic diastolic CHF (congestive heart failure) (HCC) 10/26/2024   Acute on chronic respiratory failure with hypoxia (HCC) 10/26/2024   Atrial flutter with rapid ventricular response (HCC) 10/26/2024   HLD (hyperlipidemia) 10/26/2024   Normocytic anemia 10/26/2024   Overweight (BMI 25.0-29.9)  10/26/2024   Pleuritic chest pain 10/26/2024   Pneumonia of left lower lobe due to infectious organism 10/02/2024   Atrial fibrillation with RVR (HCC) 10/01/2024   Atrial flutter (HCC) 09/15/2024   Hyperlipidemia 09/15/2024   SOB (shortness of breath) 09/15/2024   Non compliance w medication regimen 08/11/2024   Coronary artery disease involving native heart without angina pectoris 07/30/2024   Generalized abdominal pain 07/30/2024   Subclinical hyperthyroidism 07/29/2024   Constipation 07/29/2024   Chest Pain S/P  emergent cardiac catheterization 07/28/2024 --STEMI ruled out 07/28/2024   CAD , nonobstructive on cath 07/28/2024 07/28/2024   Essential hypertension 11/13/2023   GERD without esophagitis 11/13/2023   Chronic hypoxemic respiratory failure (HCC) 07/04/2023   Hypocalcemia    Hypokalemia    Glaucoma    Malnutrition of moderate degree 02/20/2021   Personal history of tobacco use, presenting hazards to health 07/25/2020   Nausea vomiting and diarrhea 12/06/2019   COPD (chronic obstructive pulmonary disease) (HCC) 10/30/2019   Epigastric abdominal pain 08/12/2012   Alcohol  abuse 03/14/2012   NICOTINE  ADDICTION 10/29/2009   ERECTILE DYSFUNCTION, NON-ORGANIC 05/04/2009   ANKLE PAIN, RIGHT 10/04/2008   FATIGUE 10/04/2008   Asthma 02/17/2008  GERD (gastroesophageal reflux disease) 02/17/2008   BPH (benign prostatic hyperplasia) 02/17/2008   PCP:  [facility] Pharmacy:   [id number] [place], [place] - [address] [place] [address] Phone: [phone] Fax: [phone]     Social Drivers of Health (SDOH) Social History: SDOH Screenings   Food Insecurity: No Food Insecurity (10/27/2024)  Housing: Low Risk (10/27/2024)  Transportation Needs: No Transportation Needs (10/27/2024)  Utilities: Not At Risk (10/27/2024)  Social Connections: Unknown (10/27/2024)  Tobacco Use: Medium Risk (10/26/2024)   SDOH Interventions:      Readmission Risk Interventions    10/28/2024   11:01 AM 10/03/2024    4:46 PM  Readmission Risk Prevention Plan  Transportation Screening Complete Complete  PCP or Specialist Appt within 3-5 Days  --  HRI or Home Care Consult  Complete  Social Work Consult for Recovery Care Planning/Counseling  Complete  Palliative Care Screening  Not Applicable  Medication Review Oceanographer) Complete Complete  PCP or Specialist appointment within 3-5 days of discharge Complete   HRI or Home Care Consult Complete   SW Recovery Care/Counseling Consult Complete   Palliative Care Screening Not Applicable   Skilled Nursing Facility Not Applicable

## 2024-10-28 NOTE — Care Management Important Message (Signed)
 Important Message  Patient Details  Name: Bryan Kelley MRN: [id number] Date of Birth: 11-Jun-1956   Important Message Given:  Yes - Medicare IM     [name] 10/28/2024, 4:46 PM

## 2024-10-29 DIAGNOSIS — I483 Typical atrial flutter: Secondary | ICD-10-CM | POA: Diagnosis not present

## 2024-10-29 DIAGNOSIS — I5030 Unspecified diastolic (congestive) heart failure: Secondary | ICD-10-CM

## 2024-10-29 DIAGNOSIS — I2510 Atherosclerotic heart disease of native coronary artery without angina pectoris: Secondary | ICD-10-CM | POA: Diagnosis not present

## 2024-10-29 DIAGNOSIS — I4892 Unspecified atrial flutter: Secondary | ICD-10-CM | POA: Diagnosis not present

## 2024-10-29 LAB — CBC WITH DIFFERENTIAL/PLATELET
Abs Immature Granulocytes: 0.15 K/uL — ABNORMAL HIGH (ref 0.00–0.07)
Basophils Absolute: 0 K/uL (ref 0.0–0.1)
Basophils Relative: 0 %
Eosinophils Absolute: 0 K/uL (ref 0.0–0.5)
Eosinophils Relative: 0 %
HCT: 34.3 % — ABNORMAL LOW (ref 39.0–52.0)
Hemoglobin: 10.6 g/dL — ABNORMAL LOW (ref 13.0–17.0)
Immature Granulocytes: 1 %
Lymphocytes Relative: 5 %
Lymphs Abs: 0.9 K/uL (ref 0.7–4.0)
MCH: 27.7 pg (ref 26.0–34.0)
MCHC: 30.9 g/dL (ref 30.0–36.0)
MCV: 89.6 fL (ref 80.0–100.0)
Monocytes Absolute: 0.9 K/uL (ref 0.1–1.0)
Monocytes Relative: 5 %
Neutro Abs: 14.5 K/uL — ABNORMAL HIGH (ref 1.7–7.7)
Neutrophils Relative %: 89 %
Platelets: 357 K/uL (ref 150–400)
RBC: 3.83 MIL/uL — ABNORMAL LOW (ref 4.22–5.81)
RDW: 14 % (ref 11.5–15.5)
WBC: 16.4 K/uL — ABNORMAL HIGH (ref 4.0–10.5)
nRBC: 0 % (ref 0.0–0.2)

## 2024-10-29 LAB — BASIC METABOLIC PANEL WITH GFR
Anion gap: 6 (ref 5–15)
BUN: 24 mg/dL — ABNORMAL HIGH (ref 8–23)
CO2: 37 mmol/L — ABNORMAL HIGH (ref 22–32)
Calcium: 10 mg/dL (ref 8.9–10.3)
Chloride: 97 mmol/L — ABNORMAL LOW (ref 98–111)
Creatinine, Ser: 0.86 mg/dL (ref 0.61–1.24)
GFR, Estimated: >60 mL/min
Glucose, Bld: 222 mg/dL — ABNORMAL HIGH (ref 70–99)
Potassium: 4.5 mmol/L (ref 3.5–5.1)
Sodium: 139 mmol/L (ref 135–145)

## 2024-10-29 LAB — CULTURE, RESPIRATORY W GRAM STAIN: Culture: NORMAL

## 2024-10-29 LAB — GLUCOSE, CAPILLARY
Glucose-Capillary: 241 mg/dL — ABNORMAL HIGH (ref 70–99)
Glucose-Capillary: 215 mg/dL — ABNORMAL HIGH (ref 70–99)
Glucose-Capillary: 219 mg/dL — ABNORMAL HIGH (ref 70–99)
Glucose-Capillary: 296 mg/dL — ABNORMAL HIGH (ref 70–99)

## 2024-10-29 MED ORDER — DOCUSATE SODIUM 100 MG PO CAPS
100.0000 mg | ORAL_CAPSULE | Freq: Two times a day (BID) | ORAL | Status: DC
  Administered 2024-10-29 – 2024-11-03 (×11): 100 mg via ORAL
  Filled 2024-10-29 (×4): qty 1

## 2024-10-29 MED ORDER — IPRATROPIUM-ALBUTEROL 0.5-2.5 (3) MG/3ML IN SOLN
3.0000 mL | Freq: Four times a day (QID) | RESPIRATORY_TRACT | Status: DC
  Administered 2024-10-29 – 2024-11-02 (×16): 3 mL via RESPIRATORY_TRACT
  Filled 2024-10-29 (×6): qty 3

## 2024-10-29 MED ORDER — POLYETHYLENE GLYCOL 3350 17 G PO PACK
17.0000 g | PACK | Freq: Every day | ORAL | Status: DC
  Administered 2024-10-29 – 2024-11-04 (×7): 17 g via ORAL
  Filled 2024-10-29 (×2): qty 1

## 2024-10-29 NOTE — Plan of Care (Signed)
  Problem: Clinical Measurements: Goal: Cardiovascular complication will be avoided Outcome: Progressing   Problem: Activity: Goal: Risk for activity intolerance will decrease Outcome: Progressing   Problem: Nutrition: Goal: Adequate nutrition will be maintained Outcome: Progressing   Problem: Safety: Goal: Ability to remain free from injury will improve Outcome: Progressing

## 2024-10-29 NOTE — Progress Notes (Signed)
 " Progress Note   Patient: Bryan Kelley FMW:[id number] DOB: [birth year] DOA: 10/26/2024     3 DOS: the patient was seen and examined on 10/29/2024     Brief hospital course: [name] is a 68 y.o. male with medical history significant of COPD on 3 L oxygen , A-fib/flutter on Eliquis , HTN, HLD, CAD, dCHF, BPH, former smoker, alcohol  abuse, anemia, who presents with SOB and chest pain. Patient was recently hospitalized from 11/29 - 12/3 due to COPD exacerbation, pneumonia and A-fib with RVR. Pt completed her course of doxycycline  and Vantin  after discharge.  In the emergency room patient was found to have A-fib with RVR with rates up to 150s.      Assessment and Plan:  Acute on chronic respiratory failure with hypoxia due to COPD exacerbation:  Patient has acute respiratory distress, with increased oxygen  requirement from 3 L to 4 L.   Patient refused BiPAP.   Continue as needed nebulization Wean off steroid as tolerated Continue doxycycline    Pleuritic chest pain:  CTA did not show PE   Chronic diastolic CHF (congestive heart failure) (HCC): 2D echo on 08/16/2024 showed EF of 60 to 85%. Has trace leg edema, with mildly elevated BNP 952, does not seem to have CHF exacerbation. Continue Lasix  Continue to monitor input and output Cardiology is on board and case discussed   Atrial flutter with rapid ventricular response (HCC): Likely triggered by COPD exacerbation. Still on Cardizem  drip Continue amiodarone  as recommended by cardiologist Continue Eliquis  Plan of care discussed with cardiologist and they are considering cardioversion on Monday after improvement in respiratory function   Essential hypertension -On Cardizem  drip Continue Lasix    CAD , nonobstructive on cath 07/28/2024: Patient has pleuritic chest pain.  Troponin is 18. Continue statin therapy, patient also on Eliquis    HLD (hyperlipidemia) -Lipitor    Normocytic anemia: Hemoglobin stable.  Monitor CBC closely    BPH (benign prostatic hyperplasia) -Continue Flomax    Alcohol  abuse:  Monitor for withdrawal Continue folic acid  and vitamin B-1   Overweight (BMI 25.0-29.9): BMI 27.6 Counseled on weight loss with diet and exercise when medically stable   Prediabetes with hyperglycemia Patient's last A1c 3 months ago was 5.9 Repeat A1c Will add supportive insulin  given hyperglycemia Monitor glucose level closely   DVT ppx: on Eliquis    Code Status: Full code   Family Communication: Discussed with patient's sister, Montie over the phone   Disposition Plan:  Anticipate discharge back to previous environment   Consults called:  none     Subjective:  Patient seen and examined at bedside this morning Heart rate much improved today Cardiologist planning DC cardioversion on Monday  Physical Exam: General: Has acute respiratory distress. Cardiac: S1/S2, RRR, No murmurs, No gallops or rubs. Respiratory: Has wheezing bilaterally GI: Soft, nondistended, nontender GU: No hematuria Ext: Has trace leg edema bilaterally.  Musculoskeletal: No joint deformities, No joint redness or warmth, no limitation of ROM in spin. Skin: No rashes.  Neuro: Alert, oriented X3, cranial nerves II-XII grossly intact, moves all extremities normally.  Psych: Patient is not psychotic, no suicidal or hemocidal ideation.     Data Reviewed:   Vitals:   10/29/24 0730 10/29/24 0802 10/29/24 1121 10/29/24 1450  BP: 109/66  125/78 131/74  Pulse: 72 80 80 98  Resp: 19     Temp: 98 F (36.7 C)  98.2 F (36.8 C) 97.8 F (36.6 C)  TempSrc: Oral  Oral Oral  SpO2: 100% 100% 100% 96%  Weight:      Height:          Latest Ref Rng & Units 10/29/2024    3:27 AM 10/27/2024    5:26 AM 10/26/2024    9:00 PM  CBC  WBC 4.0 - 10.5 K/uL 16.4  7.7  8.2   Hemoglobin 13.0 - 17.0 g/dL 89.3  89.4  89.0   Hematocrit 39.0 - 52.0 % 34.3  33.6  34.6   Platelets 150 - 400 K/uL 357  311  316        Latest Ref Rng & Units  10/29/2024    3:27 AM 10/27/2024    5:26 AM 10/26/2024    9:00 PM  BMP  Glucose 70 - 99 mg/dL 777  684  855   BUN 8 - 23 mg/dL 24  13  11    Creatinine 0.61 - 1.24 mg/dL 9.13  9.25  9.23   Sodium 135 - 145 mmol/L 139  137  137   Potassium 3.5 - 5.1 mmol/L 4.5  4.7  3.7   Chloride 98 - 111 mmol/L 97  96  95   CO2 22 - 32 mmol/L 37  32  33   Calcium  8.9 - 10.3 mg/dL 89.9  8.9  8.7      Author: Drue ONEIDA Potter, MD 10/29/2024 5:33 PM  For on call review [email or web address].  "

## 2024-10-29 NOTE — Progress Notes (Signed)
 "  Rounding Note   Patient Name: Bryan Kelley Date of Encounter: 10/29/2024  [facility] Cardiologist: Alm Clay, MD   Subjective No acute events overnight, feeling a little more short of breath upon walking into the room.  Complains of leg edema..  Nasal cannula oxygen  was disconnected from the wall.  Reconnected with improvement in shortness of breath.  Still in atrial flutter, on Cardizem  drip.  Scheduled Meds:  amiodarone   400 mg Oral BID   apixaban   5 mg Oral BID   artificial tears  1 drop Both Eyes BID   atorvastatin   80 mg Oral Daily   brimonidine   1 drop Left Eye BID   brinzolamide   2 drop Left Eye BID   diltiazem   180 mg Oral Daily   docusate sodium   100 mg Oral BID   doxycycline   100 mg Oral Q12H   folic acid   1 mg Oral Daily   furosemide   40 mg Oral Daily   insulin  aspart  0-5 Units Subcutaneous QHS   insulin  aspart  0-9 Units Subcutaneous TID WC   ipratropium-albuterol   3 mL Nebulization Q6H   montelukast   10 mg Oral QHS   pantoprazole   20 mg Oral Daily   polyethylene glycol  17 g Oral Daily   predniSONE   40 mg Oral Q breakfast   tamsulosin   0.4 mg Oral Daily   thiamine   100 mg Oral Daily   Continuous Infusions:  diltiazem  (CARDIZEM ) infusion 15 mg/hr (10/29/24 1224)   PRN Meds: acetaminophen , albuterol , dextromethorphan -guaiFENesin , diphenhydrAMINE , hydrALAZINE , metoprolol  tartrate, mouth rinse, oxyCODONE -acetaminophen    Vital Signs  Vitals:   10/29/24 0348 10/29/24 0730 10/29/24 0802 10/29/24 1121  BP: (!) 126/90 109/66  125/78  Pulse:  72 80 80  Resp: 18 19    Temp: 98.3 F (36.8 C) 98 F (36.7 C)  98.2 F (36.8 C)  TempSrc:  Oral  Oral  SpO2:  100% 100% 100%  Weight: 77.5 kg     Height:        Intake/Output Summary (Last 24 hours) at 10/29/2024 1323 Last data filed at 10/29/2024 1216 Gross per 24 hour  Intake 480 ml  Output 1350 ml  Net -870 ml      10/29/2024    3:48 AM 10/28/2024    5:02 AM 10/27/2024    5:36 AM  Last  3 Weights  Weight (lbs) 170 lb 13.7 oz 171 lb 4.8 oz 173 lb 15.1 oz  Weight (kg) 77.5 kg 77.7 kg 78.9 kg      Telemetry Atrial flutter heart rate 84- Personally Reviewed  ECG   Physical Exam  GEN: Mild respiratory distress Neck: No JVD Cardiac: Regular rhythm no murmurs Respiratory: Bilateral wheezing GI: Soft, nontender, non-distended  MS: No edema; No deformity. Neuro:  Nonfocal  Psych: Normal affect   Labs High Sensitivity Troponin:  No results for input(s): TROPONINIHS in the last 720 hours.  Recent Labs  Lab 10/01/24 1600 10/01/24 1809 10/26/24 2100  TRNPT 26* 22* 18       Chemistry Recent Labs  Lab 10/26/24 2100 10/27/24 0526 10/29/24 0327  NA 137 137 139  K 3.7 4.7 4.5  CL 95* 96* 97*  CO2 33* 32 37*  GLUCOSE 144* 315* 222*  BUN 11 13 24*  CREATININE 0.76 0.74 0.86  CALCIUM  8.7* 8.9 10.0  MG 1.9  --   --   GFRNONAA >60 >60 >60  ANIONGAP 9 10 6     Lipids No results for input(s):  CHOL, TRIG, HDL, LABVLDL, LDLCALC, CHOLHDL in the last 168 hours.  Hematology Recent Labs  Lab 10/26/24 2100 10/27/24 0526 10/29/24 0327  WBC 8.2 7.7 16.4*  RBC 3.92* 3.74* 3.83*  HGB 10.9* 10.5* 10.6*  HCT 34.6* 33.6* 34.3*  MCV 88.3 89.8 89.6  MCH 27.8 28.1 27.7  MCHC 31.5 31.3 30.9  RDW 13.9 13.8 14.0  PLT 316 311 357   Thyroid  No results for input(s): TSH, FREET4 in the last 168 hours.  BNP Recent Labs  Lab 10/26/24 2100  PROBNP 952.0*    DDimer No results for input(s): DDIMER in the last 168 hours.   Radiology  No results found.  Cardiac Studies Echo 9/25 EF 65 to 70%.  Patient Profile   68 y.o. male with history of atrial flutter s/p DCCV x 2 in 2025, COPD on 3 L home oxygen , presenting with worsening shortness of breath diagnosed with COPD exacerbation and atrial flutter,  Assessment & Plan  Persistent atrial flutter -Heart rate improved -Continue p.o. amiodarone  400 mg twice daily, Cardizem  drip. -Continue Eliquis  5 mg  twice daily -Plan DC cardioversion Monday if patient is still in atrial flutter.  2.  CAD (90% D1, 50% mid LAD) - Denies chest pain - D1-2 small for intervention.  Medical management recommended. - Continue Eliquis , Lipitor  80 mg daily.  3.  HFpEF. - Appears euvolemic - Continue Lasix  40 mg daily.  Signed, Redell Cave, MD  10/29/2024, 1:23 PM    "

## 2024-10-29 NOTE — Plan of Care (Signed)
 " Problem: Education: Goal: Knowledge of General Education information will improve Description: Including pain rating scale, medication(s)/side effects and non-pharmacologic comfort measures Outcome: Progressing   Problem: Health Behavior/Discharge Planning: Goal: Ability to manage health-related needs will improve Outcome: Progressing   Problem: Clinical Measurements: Goal: Ability to maintain clinical measurements within normal limits will improve Outcome: Progressing Goal: Will remain free from infection Outcome: Progressing Goal: Diagnostic test results will improve Outcome: Progressing Goal: Respiratory complications will improve Outcome: Progressing Goal: Cardiovascular complication will be avoided Outcome: Progressing   Problem: Activity: Goal: Risk for activity intolerance will decrease Outcome: Progressing   Problem: Nutrition: Goal: Adequate nutrition will be maintained Outcome: Progressing   Problem: Coping: Goal: Level of anxiety will decrease Outcome: Progressing   Problem: Elimination: Goal: Will not experience complications related to bowel motility Outcome: Progressing Goal: Will not experience complications related to urinary retention Outcome: Progressing   Problem: Pain Managment: Goal: General experience of comfort will improve and/or be controlled Outcome: Progressing   Problem: Safety: Goal: Ability to remain free from injury will improve Outcome: Progressing   Problem: Skin Integrity: Goal: Risk for impaired skin integrity will decrease Outcome: Progressing   Problem: Education: Goal: Knowledge of disease or condition will improve Outcome: Progressing Goal: Knowledge of the prescribed therapeutic regimen will improve Outcome: Progressing Goal: Individualized Educational Video(s) Outcome: Progressing   Problem: Activity: Goal: Ability to tolerate increased activity will improve Outcome: Progressing Goal: Will verbalize the  importance of balancing activity with adequate rest periods Outcome: Progressing   Problem: Respiratory: Goal: Ability to maintain a clear airway will improve Outcome: Progressing Goal: Levels of oxygenation will improve Outcome: Progressing Goal: Ability to maintain adequate ventilation will improve Outcome: Progressing   Problem: Education: Goal: Knowledge of disease or condition will improve Outcome: Progressing Goal: Understanding of medication regimen will improve Outcome: Progressing Goal: Individualized Educational Video(s) Outcome: Progressing   Problem: Activity: Goal: Ability to tolerate increased activity will improve Outcome: Progressing   Problem: Cardiac: Goal: Ability to achieve and maintain adequate cardiopulmonary perfusion will improve Outcome: Progressing   Problem: Health Behavior/Discharge Planning: Goal: Ability to safely manage health-related needs after discharge will improve Outcome: Progressing   Problem: Education: Goal: Ability to demonstrate management of disease process will improve Outcome: Progressing Goal: Ability to verbalize understanding of medication therapies will improve Outcome: Progressing Goal: Individualized Educational Video(s) Outcome: Progressing   Problem: Activity: Goal: Capacity to carry out activities will improve Outcome: Progressing   Problem: Cardiac: Goal: Ability to achieve and maintain adequate cardiopulmonary perfusion will improve Outcome: Progressing   Problem: Education: Goal: Ability to describe self-care measures that may prevent or decrease complications (Diabetes Survival Skills Education) will improve Outcome: Progressing Goal: Individualized Educational Video(s) Outcome: Progressing   Problem: Coping: Goal: Ability to adjust to condition or change in health will improve Outcome: Progressing   Problem: Fluid Volume: Goal: Ability to maintain a balanced intake and output will improve Outcome:  Progressing   Problem: Health Behavior/Discharge Planning: Goal: Ability to identify and utilize available resources and services will improve Outcome: Progressing Goal: Ability to manage health-related needs will improve Outcome: Progressing   Problem: Metabolic: Goal: Ability to maintain appropriate glucose levels will improve Outcome: Progressing   Problem: Nutritional: Goal: Maintenance of adequate nutrition will improve Outcome: Progressing Goal: Progress toward achieving an optimal weight will improve Outcome: Progressing   Problem: Skin Integrity: Goal: Risk for impaired skin integrity will decrease Outcome: Progressing   Problem: Tissue Perfusion: Goal: Adequacy of tissue perfusion will improve Outcome:  Progressing   "

## 2024-10-30 DIAGNOSIS — I480 Paroxysmal atrial fibrillation: Secondary | ICD-10-CM | POA: Diagnosis not present

## 2024-10-30 DIAGNOSIS — I4891 Unspecified atrial fibrillation: Secondary | ICD-10-CM | POA: Diagnosis not present

## 2024-10-30 LAB — CBC WITH DIFFERENTIAL/PLATELET
Abs Immature Granulocytes: 0.08 K/uL — ABNORMAL HIGH (ref 0.00–0.07)
Basophils Absolute: 0 K/uL (ref 0.0–0.1)
Basophils Relative: 0 %
Eosinophils Absolute: 0 K/uL (ref 0.0–0.5)
Eosinophils Relative: 0 %
HCT: 35.6 % — ABNORMAL LOW (ref 39.0–52.0)
Hemoglobin: 11 g/dL — ABNORMAL LOW (ref 13.0–17.0)
Immature Granulocytes: 1 %
Lymphocytes Relative: 15 %
Lymphs Abs: 1.9 K/uL (ref 0.7–4.0)
MCH: 27.8 pg (ref 26.0–34.0)
MCHC: 30.9 g/dL (ref 30.0–36.0)
MCV: 90.1 fL (ref 80.0–100.0)
Monocytes Absolute: 0.9 K/uL (ref 0.1–1.0)
Monocytes Relative: 7 %
Neutro Abs: 10.3 K/uL — ABNORMAL HIGH (ref 1.7–7.7)
Neutrophils Relative %: 77 %
Platelets: 381 K/uL (ref 150–400)
RBC: 3.95 MIL/uL — ABNORMAL LOW (ref 4.22–5.81)
RDW: 14.1 % (ref 11.5–15.5)
WBC: 13.2 K/uL — ABNORMAL HIGH (ref 4.0–10.5)
nRBC: 0 % (ref 0.0–0.2)

## 2024-10-30 LAB — BASIC METABOLIC PANEL WITH GFR
Anion gap: 5 (ref 5–15)
BUN: 22 mg/dL (ref 8–23)
CO2: 40 mmol/L — ABNORMAL HIGH (ref 22–32)
Calcium: 9.8 mg/dL (ref 8.9–10.3)
Chloride: 96 mmol/L — ABNORMAL LOW (ref 98–111)
Creatinine, Ser: 0.87 mg/dL (ref 0.61–1.24)
GFR, Estimated: >60 mL/min
Glucose, Bld: 194 mg/dL — ABNORMAL HIGH (ref 70–99)
Potassium: 4 mmol/L (ref 3.5–5.1)
Sodium: 141 mmol/L (ref 135–145)

## 2024-10-30 LAB — GLUCOSE, CAPILLARY
Glucose-Capillary: 170 mg/dL — ABNORMAL HIGH (ref 70–99)
Glucose-Capillary: 169 mg/dL — ABNORMAL HIGH (ref 70–99)
Glucose-Capillary: 286 mg/dL — ABNORMAL HIGH (ref 70–99)
Glucose-Capillary: 288 mg/dL — ABNORMAL HIGH (ref 70–99)

## 2024-10-30 NOTE — Progress Notes (Signed)
 " PROGRESS NOTE    Bryan Kelley  FMW:[id number] DOB: [birth year] DOA: 10/26/2024 PCP: [facility]    Brief Narrative:  Bryan Kelley is a 68 y.o. male with medical history significant of COPD on 3 L oxygen , A-fib/flutter on Eliquis , HTN, HLD, CAD, dCHF, BPH, former smoker, alcohol  abuse, anemia, who presents with SOB and chest pain. Patient was recently hospitalized from 11/29 - 12/3 due to COPD exacerbation, pneumonia and A-fib with RVR. Pt completed her course of doxycycline  and Vantin  after discharge.  In the emergency room patient was found to have A-fib with RVR with rates up to 150s.       Assessment & Plan:   Active Problems:   COPD exacerbation (HCC)   Acute on chronic respiratory failure with hypoxia (HCC)   Pleuritic chest pain   Chronic diastolic CHF (congestive heart failure) (HCC)   Atrial flutter with rapid ventricular response (HCC)   Essential hypertension   CAD , nonobstructive on cath 07/28/2024   HLD (hyperlipidemia)   Normocytic anemia   BPH (benign prostatic hyperplasia)   Alcohol  abuse   Overweight (BMI 25.0-29.9)   Coronary artery disease without angina pectoris  Acute on chronic respiratory failure with hypoxia due to COPD exacerbation:  Patient with baseline 3 to 4 L requirement.  Acute respiratory distress has improved.  Patient did not tolerate BiPAP Plan: Continue steroids Continue doxycycline  Scheduled and as needed bronchodilators Wean oxygen  as tolerated Goal saturation 88-92%    Pleuritic chest pain:  CTA did not show PE Chest pain resolved   Chronic diastolic CHF (congestive heart failure) (HCC):  2D echo on 08/16/2024 showed EF of 60 to 85%. Has trace leg edema, with mildly elevated BNP 952, does not seem to have CHF exacerbation. Plan: Continue oral Lasix  Continue to monitor input and output Cardiology is on board and case discussed   Atrial flutter with rapid ventricular response (HCC): Likely triggered by COPD  exacerbation. Patient has a history of refractory atrial fibrillation requiring cardioversion Plan: Continue Cardizem  drip Continue oral amiodarone  Continue Eliquis  5 mg twice daily Plan DC cardioversion tomorrow EP follow-up as outpatient   Essential hypertension -On Cardizem  drip Continue Lasix  as above   CAD , nonobstructive on cath 07/28/2024:  Patient has pleuritic chest pain.  Now resolved.  Troponins flat.  Continue statin Continue Eliquis    HLD (hyperlipidemia) Continue Lipitor    Normocytic anemia:  Hemoglobin stable.  Continue to monitor CBC closely   BPH (benign prostatic hyperplasia) - Continue Flomax    Alcohol  abuse:  Monitor for withdrawal, none noted Continue folic acid  and vitamin B-1   Overweight (BMI 25.0-29.9): BMI 27.6 Counseled on weight loss   Prediabetes with hyperglycemia Patient's last A1c 3 months ago was 5.9 Repeat hemoglobin on 12/26 7.0 Continue insulin  therapy while on steroids Monitor glucose closely  DVT prophylaxis: Eliquis  Code Status: Full Family Communication: None Disposition Plan: Status is: Inpatient Remains inpatient appropriate because: A-fib, plan cardioversion 12/29.  Possible medical readiness 12/30   Level of care: Progressive  Consultants:  Cardiology  Procedures:  None  Antimicrobials: Doxycycline    Subjective: Seen and examined.  Resting in bed.  Reports improvement in chest pain.  Reports shortness of breath is improving but not yet at baseline.  Objective: Vitals:   10/30/24 0736 10/30/24 0800 10/30/24 1206 10/30/24 1312  BP:  138/89 122/82   Pulse:  89 92   Resp:  19 20   Temp:   97.7 F (36.5 C)   TempSrc:  Oral   SpO2: 100% 99% 100% 100%  Weight:      Height:        Intake/Output Summary (Last 24 hours) at 10/30/2024 1402 Last data filed at 10/30/2024 1208 Gross per 24 hour  Intake 260 ml  Output 500 ml  Net -240 ml   Filed Weights   10/28/24 0502 10/29/24 0348 10/30/24 0500   Weight: 77.7 kg 77.5 kg 80.8 kg    Examination:  General exam: Appears calm and comfortable  Respiratory system: Manage air entry bilaterally.  Bibasilar wheeze.  Normal work of breathing.  4 L Cardiovascular system: S1-S2, regular rate, irregular rhythm, no murmurs, no pedal edema Gastrointestinal system: Soft, NT/ND, normal bowel sounds Central nervous system: Alert and oriented. No focal neurological deficits. Extremities: Symmetric 5 x 5 power. Skin: No rashes, lesions or ulcers Psychiatry: Judgement and insight appear normal. Mood & affect appropriate.     Data Reviewed: I have personally reviewed following labs and imaging studies  CBC: Recent Labs  Lab 10/26/24 2100 10/27/24 0526 10/29/24 0327 10/30/24 0342  WBC 8.2 7.7 16.4* 13.2*  NEUTROABS  --   --  14.5* 10.3*  HGB 10.9* 10.5* 10.6* 11.0*  HCT 34.6* 33.6* 34.3* 35.6*  MCV 88.3 89.8 89.6 90.1  PLT 316 311 357 381   Basic Metabolic Panel: Recent Labs  Lab 10/26/24 2100 10/27/24 0526 10/29/24 0327 10/30/24 0342  NA 137 137 139 141  K 3.7 4.7 4.5 4.0  CL 95* 96* 97* 96*  CO2 33* 32 37* 40*  GLUCOSE 144* 315* 222* 194*  BUN 11 13 24* 22  CREATININE 0.76 0.74 0.86 0.87  CALCIUM  8.7* 8.9 10.0 9.8  MG 1.9  --   --   --   PHOS 2.7  --   --   --    GFR: Estimated Creatinine Clearance: 82.3 mL/min (by C-G formula based on SCr of 0.87 mg/dL). Liver Function Tests: No results for input(s): AST, ALT, ALKPHOS, BILITOT, PROT, ALBUMIN in the last 168 hours. No results for input(s): LIPASE, AMYLASE in the last 168 hours. No results for input(s): AMMONIA in the last 168 hours. Coagulation Profile: No results for input(s): INR, PROTIME in the last 168 hours. Cardiac Enzymes: No results for input(s): CKTOTAL, CKMB, CKMBINDEX, TROPONINI in the last 168 hours. BNP (last 3 results) Recent Labs    10/26/24 2100  PROBNP 952.0*   HbA1C: Recent Labs    10/28/24 1559  HGBA1C 7.0*    CBG: Recent Labs  Lab 10/29/24 1129 10/29/24 1706 10/29/24 2031 10/30/24 0757 10/30/24 1220  GLUCAP 215* 219* 296* 170* 169*   Lipid Profile: No results for input(s): CHOL, HDL, LDLCALC, TRIG, CHOLHDL, LDLDIRECT in the last 72 hours. Thyroid  Function Tests: No results for input(s): TSH, T4TOTAL, FREET4, T3FREE, THYROIDAB in the last 72 hours. Anemia Panel: No results for input(s): VITAMINB12, FOLATE, FERRITIN, TIBC, IRON, RETICCTPCT in the last 72 hours. Sepsis Labs: No results for input(s): PROCALCITON, LATICACIDVEN in the last 168 hours.  Recent Results (from the past 240 hours)  Resp panel by RT-PCR (RSV, Flu A&B, Covid) Anterior Nasal Swab     Status: None   Collection Time: 10/26/24 11:14 PM   Specimen: Anterior Nasal Swab  Result Value Ref Range Status   SARS Coronavirus 2 by RT PCR NEGATIVE NEGATIVE Final    Comment: (NOTE) SARS-CoV-2 target nucleic acids are NOT DETECTED.  The SARS-CoV-2 RNA is generally detectable in upper respiratory specimens during the acute phase of infection. The  lowest concentration of SARS-CoV-2 viral copies this assay can detect is 138 copies/mL. A negative result does not preclude SARS-Cov-2 infection and should not be used as the sole basis for treatment or other patient management decisions. A negative result may occur with  improper specimen collection/handling, submission of specimen other than nasopharyngeal swab, presence of viral mutation(s) within the areas targeted by this assay, and inadequate number of viral copies(<138 copies/mL). A negative result must be combined with clinical observations, patient history, and epidemiological information. The expected result is Negative.  Fact Sheet for Patients:  bloggercourse.com  Fact Sheet for Healthcare Providers:  seriousbroker.it  This test is no t yet approved or cleared by the [place]  [place] FDA and  has been authorized for detection and/or diagnosis of SARS-CoV-2 by FDA under an Emergency Use Authorization (EUA). This EUA will remain  in effect (meaning this test can be used) for the duration of the COVID-19 declaration under Section 564(b)(1) of the Act, 21 U.S.C.section 360bbb-3(b)(1), unless the authorization is terminated  or revoked sooner.       Influenza A by PCR NEGATIVE NEGATIVE Final   Influenza B by PCR NEGATIVE NEGATIVE Final    Comment: (NOTE) The Xpert Xpress SARS-CoV-2/FLU/RSV plus assay is intended as an aid in the diagnosis of influenza from Nasopharyngeal swab specimens and should not be used as a sole basis for treatment. Nasal washings and aspirates are unacceptable for Xpert Xpress SARS-CoV-2/FLU/RSV testing.  Fact Sheet for Patients: bloggercourse.com  Fact Sheet for Healthcare Providers: seriousbroker.it  This test is not yet approved or cleared by the [place]  FDA and has been authorized for detection and/or diagnosis of SARS-CoV-2 by FDA under an Emergency Use Authorization (EUA). This EUA will remain in effect (meaning this test can be used) for the duration of the COVID-19 declaration under Section 564(b)(1) of the Act, 21 U.S.C. section 360bbb-3(b)(1), unless the authorization is terminated or revoked.     Resp Syncytial Virus by PCR NEGATIVE NEGATIVE Final    Comment: (NOTE) Fact Sheet for Patients: bloggercourse.com  Fact Sheet for Healthcare Providers: seriousbroker.it  This test is not yet approved or cleared by the [place]  FDA and has been authorized for detection and/or diagnosis of SARS-CoV-2 by FDA under an Emergency Use Authorization (EUA). This EUA will remain in effect (meaning this test can be used) for the duration of the COVID-19 declaration under Section 564(b)(1) of the Act, 21 U.S.C. section  360bbb-3(b)(1), unless the authorization is terminated or revoked.  Performed at [facility], [address] [place], [place] [address]   Expectorated Sputum Assessment w Gram Stain, Rflx to Resp Cult     Status: None   Collection Time: 10/27/24  1:11 AM   Specimen: Expectorated Sputum  Result Value Ref Range Status   Specimen Description EXPECTORATED SPUTUM  Final   Special Requests NONE  Final   Sputum evaluation   Final    THIS SPECIMEN IS ACCEPTABLE FOR SPUTUM CULTURE Performed at [facility], [address] [place], [place] [address]    Report Status 10/27/2024 FINAL  Final  Culture, Respiratory w Gram Stain     Status: None   Collection Time: 10/27/24  1:11 AM  Result Value Ref Range Status   Specimen Description   Final    EXPECTORATED SPUTUM Performed at [facility], [address], [place], [place] [address]    Special Requests   Final    NONE Reflexed from [id number] Performed at [facility],  [address], [place], [place] [address]    Gram Stain   Final    RARE WBC PRESENT, PREDOMINANTLY PMN FEW GRAM POSITIVE COCCI RARE GRAM NEGATIVE RODS    Culture   Final    MODERATE Normal respiratory flora-no Staph aureus or Pseudomonas seen Performed at [facility], [address], [place], [place] [address]    Report Status 10/29/2024 FINAL  Final         Radiology Studies: No results found.      Scheduled Meds:  amiodarone   400 mg Oral BID   apixaban   5 mg Oral BID   artificial tears  1 drop Both Eyes BID   atorvastatin   80 mg Oral Daily   brimonidine   1 drop Left Eye BID   brinzolamide   2 drop Left Eye BID   docusate sodium   100 mg Oral BID   doxycycline   100 mg Oral Q12H   folic acid   1 mg Oral Daily   furosemide   40 mg Oral Daily   insulin  aspart  0-5 Units Subcutaneous QHS   insulin  aspart  0-9 Units Subcutaneous TID WC   ipratropium-albuterol   3 mL Nebulization Q6H   montelukast   10 mg Oral QHS    pantoprazole   20 mg Oral Daily   polyethylene glycol  17 g Oral Daily   predniSONE   40 mg Oral Q breakfast   tamsulosin   0.4 mg Oral Daily   thiamine   100 mg Oral Daily   Continuous Infusions:  diltiazem  (CARDIZEM ) infusion 15 mg/hr (10/30/24 1139)     LOS: 4 days     Calvin KATHEE Robson, MD [facility]   If 7PM-7AM, please contact night-coverage  10/30/2024, 2:02 PM   "

## 2024-10-30 NOTE — Plan of Care (Signed)
" °  Problem: Clinical Measurements: Goal: Cardiovascular complication will be avoided Outcome: Progressing   Problem: Activity: Goal: Risk for activity intolerance will decrease Outcome: Progressing   Problem: Nutrition: Goal: Adequate nutrition will be maintained Outcome: Progressing   Problem: Elimination: Goal: Will not experience complications related to urinary retention Outcome: Progressing   Problem: Safety: Goal: Ability to remain free from injury will improve Outcome: Progressing   "

## 2024-10-30 NOTE — Progress Notes (Signed)
 "  Rounding Note   Patient Name: Bryan Kelley Date of Encounter: 10/30/2024  [facility] Cardiologist: Alm Clay, MD   Subjective Doing okay, sleeping comfortably.  Still on Cardizem  drip.  Heart rate controlled.  Scheduled Meds:  amiodarone   400 mg Oral BID   apixaban   5 mg Oral BID   artificial tears  1 drop Both Eyes BID   atorvastatin   80 mg Oral Daily   brimonidine   1 drop Left Eye BID   brinzolamide   2 drop Left Eye BID   docusate sodium   100 mg Oral BID   doxycycline   100 mg Oral Q12H   folic acid   1 mg Oral Daily   furosemide   40 mg Oral Daily   insulin  aspart  0-5 Units Subcutaneous QHS   insulin  aspart  0-9 Units Subcutaneous TID WC   ipratropium-albuterol   3 mL Nebulization Q6H   montelukast   10 mg Oral QHS   pantoprazole   20 mg Oral Daily   polyethylene glycol  17 g Oral Daily   predniSONE   40 mg Oral Q breakfast   tamsulosin   0.4 mg Oral Daily   thiamine   100 mg Oral Daily   Continuous Infusions:  diltiazem  (CARDIZEM ) infusion 15 mg/hr (10/30/24 1139)   PRN Meds: acetaminophen , albuterol , dextromethorphan -guaiFENesin , diphenhydrAMINE , hydrALAZINE , metoprolol  tartrate, mouth rinse, oxyCODONE -acetaminophen    Vital Signs  Vitals:   10/30/24 0325 10/30/24 0500 10/30/24 0736 10/30/24 0800  BP:    138/89  Pulse: 89     Resp:    19  Temp: 98.2 F (36.8 C)     TempSrc:      SpO2: 100%  100%   Weight:  80.8 kg    Height:        Intake/Output Summary (Last 24 hours) at 10/30/2024 1152 Last data filed at 10/30/2024 0841 Gross per 24 hour  Intake 500 ml  Output 250 ml  Net 250 ml      10/30/2024    5:00 AM 10/29/2024    3:48 AM 10/28/2024    5:02 AM  Last 3 Weights  Weight (lbs) 178 lb 2.1 oz 170 lb 13.7 oz 171 lb 4.8 oz  Weight (kg) 80.8 kg 77.5 kg 77.7 kg      Telemetry Atrial flutter heart rate 84- Personally Reviewed  ECG   Physical Exam  GEN: Mild respiratory distress Neck: No JVD Cardiac: Regular rhythm no  murmurs Respiratory: Bilateral wheezing GI: Soft, nontender, non-distended  MS: No edema; No deformity. Neuro:  Nonfocal  Psych: Normal affect   Labs High Sensitivity Troponin:  No results for input(s): TROPONINIHS in the last 720 hours.  Recent Labs  Lab 10/01/24 1600 10/01/24 1809 10/26/24 2100  TRNPT 26* 22* 18       Chemistry Recent Labs  Lab 10/26/24 2100 10/27/24 0526 10/29/24 0327 10/30/24 0342  NA 137 137 139 141  K 3.7 4.7 4.5 4.0  CL 95* 96* 97* 96*  CO2 33* 32 37* 40*  GLUCOSE 144* 315* 222* 194*  BUN 11 13 24* 22  CREATININE 0.76 0.74 0.86 0.87  CALCIUM  8.7* 8.9 10.0 9.8  MG 1.9  --   --   --   GFRNONAA >60 >60 >60 >60  ANIONGAP 9 10 6 5     Lipids No results for input(s): CHOL, TRIG, HDL, LABVLDL, LDLCALC, CHOLHDL in the last 168 hours.  Hematology Recent Labs  Lab 10/27/24 0526 10/29/24 0327 10/30/24 0342  WBC 7.7 16.4* 13.2*  RBC 3.74* 3.83* 3.95*  HGB 10.5* 10.6* 11.0*  HCT 33.6* 34.3* 35.6*  MCV 89.8 89.6 90.1  MCH 28.1 27.7 27.8  MCHC 31.3 30.9 30.9  RDW 13.8 14.0 14.1  PLT 311 357 381   Thyroid  No results for input(s): TSH, FREET4 in the last 168 hours.  BNP Recent Labs  Lab 10/26/24 2100  PROBNP 952.0*    DDimer No results for input(s): DDIMER in the last 168 hours.   Radiology  No results found.  Cardiac Studies Echo 9/25 EF 65 to 70%.  Patient Profile   68 y.o. male with history of atrial flutter s/p DCCV x 2 in 2025, COPD on 3 L home oxygen , presenting with worsening shortness of breath diagnosed with COPD exacerbation and atrial flutter,  Assessment & Plan  Persistent atrial flutter -Heart rate improved -Continue p.o. amiodarone  400 mg twice daily, Cardizem  drip. -Continue Eliquis  5 mg twice daily -Plan DC cardioversion tomorrow. - EP as outpatient.  2.  CAD (90% D1, 50% mid LAD) - Denies chest pain - D1-2 small for intervention.  Medical management recommended. - Continue Eliquis , Lipitor  80  mg daily.  3.  HFpEF. - Appears euvolemic - Continue Lasix  40 mg daily.  Signed, Redell Cave, MD  10/30/2024, 11:52 AM    "

## 2024-10-31 ENCOUNTER — Inpatient Hospital Stay: Admitting: Anesthesiology

## 2024-10-31 ENCOUNTER — Telehealth (HOSPITAL_COMMUNITY): Payer: Self-pay | Admitting: Pharmacy Technician

## 2024-10-31 ENCOUNTER — Encounter
Admission: EM | Disposition: A | Discharge status: Home / Self Care | Payer: Self-pay | Source: Home / Self Care | Attending: Internal Medicine

## 2024-10-31 ENCOUNTER — Other Ambulatory Visit (HOSPITAL_COMMUNITY): Payer: Self-pay

## 2024-10-31 DIAGNOSIS — I4819 Other persistent atrial fibrillation: Secondary | ICD-10-CM | POA: Diagnosis not present

## 2024-10-31 DIAGNOSIS — I483 Typical atrial flutter: Secondary | ICD-10-CM | POA: Diagnosis not present

## 2024-10-31 DIAGNOSIS — J441 Chronic obstructive pulmonary disease with (acute) exacerbation: Secondary | ICD-10-CM | POA: Diagnosis not present

## 2024-10-31 HISTORY — PX: CARDIOVERSION: SHX1299

## 2024-10-31 LAB — GLUCOSE, CAPILLARY
Glucose-Capillary: 147 mg/dL — ABNORMAL HIGH (ref 70–99)
Glucose-Capillary: 351 mg/dL — ABNORMAL HIGH (ref 70–99)
Glucose-Capillary: 243 mg/dL — ABNORMAL HIGH (ref 70–99)

## 2024-10-31 SURGERY — CARDIOVERSION
Anesthesia: General

## 2024-10-31 MED ORDER — BUDESONIDE 0.25 MG/2ML IN SUSP
0.2500 mg | Freq: Two times a day (BID) | RESPIRATORY_TRACT | Status: DC
  Administered 2024-10-31 – 2024-11-04 (×8): 0.25 mg via RESPIRATORY_TRACT
  Filled 2024-10-31 (×9): qty 2

## 2024-10-31 MED ORDER — DAPAGLIFLOZIN PROPANEDIOL 10 MG PO TABS
10.0000 mg | ORAL_TABLET | Freq: Every day | ORAL | Status: DC
  Administered 2024-10-31 – 2024-11-04 (×5): 10 mg via ORAL
  Filled 2024-10-31 (×5): qty 1

## 2024-10-31 MED ORDER — SODIUM CHLORIDE 0.9 % IV SOLN: INTRAVENOUS | Status: DC

## 2024-10-31 MED ORDER — ARFORMOTEROL TARTRATE 15 MCG/2ML IN NEBU
15.0000 ug | INHALATION_SOLUTION | Freq: Two times a day (BID) | RESPIRATORY_TRACT | Status: DC
  Administered 2024-10-31 – 2024-11-04 (×8): 15 ug via RESPIRATORY_TRACT
  Filled 2024-10-31 (×11): qty 2

## 2024-10-31 MED ORDER — PROPOFOL 10 MG/ML IV BOLUS
INTRAVENOUS | Status: DC | PRN
  Administered 2024-10-31: 80 mg via INTRAVENOUS

## 2024-10-31 MED ORDER — DILTIAZEM HCL ER COATED BEADS 180 MG PO CP24
180.0000 mg | ORAL_CAPSULE | Freq: Every day | ORAL | Status: DC
  Administered 2024-10-31 – 2024-11-03 (×4): 180 mg via ORAL
  Filled 2024-10-31 (×4): qty 1

## 2024-10-31 NOTE — Anesthesia Preprocedure Evaluation (Signed)
"                                    Anesthesia Evaluation  Patient identified by MRN, date of birth, ID band Patient awake    Reviewed: Allergy & Precautions, H&P , NPO status , Patient's Chart, lab work & pertinent test results, reviewed documented beta blocker date and time   History of Anesthesia Complications Negative for: history of anesthetic complications  Airway Mallampati: II  TM Distance: >3 FB Neck ROM: full    Dental  (+) Poor Dentition   Pulmonary shortness of breath, asthma , neg sleep apnea, COPD,  COPD inhaler and oxygen  dependent, neg recent URI, Patient did not abstain from smoking., former smoker   Pulmonary exam normal        Cardiovascular Exercise Tolerance: Poor hypertension, + CAD and +CHF  Normal cardiovascular examAtrial Fibrillation  Rhythm:regular Rate:Normal     Neuro/Psych  PSYCHIATRIC DISORDERS  Depression    negative neurological ROS  negative psych ROS   GI/Hepatic Neg liver ROS,GERD  Medicated,,  Endo/Other   Hyperthyroidism   Renal/GU negative Renal ROS  negative genitourinary   Musculoskeletal   Abdominal   Peds  Hematology  (+) Blood dyscrasia, anemia   Anesthesia Other Findings     Reproductive/Obstetrics negative OB ROS                              Anesthesia Physical Anesthesia Plan  ASA: 4  Anesthesia Plan: General   Post-op Pain Management:    Induction: Intravenous  PONV Risk Score and Plan: 2 and Propofol  infusion and TIVA  Airway Management Planned: Natural Airway and Nasal Cannula  Additional Equipment:   Intra-op Plan:   Post-operative Plan:   Informed Consent: I have reviewed the patients History and Physical, chart, labs and discussed the procedure including the risks, benefits and alternatives for the proposed anesthesia with the patient or authorized representative who has indicated his/her understanding and acceptance.     Dental Advisory Given  Plan  Discussed with: CRNA  Anesthesia Plan Comments: (Patient consented for risks of anesthesia including but not limited to:  - adverse reactions to medications - risk of airway placement if required - damage to eyes, teeth, lips or other oral mucosa - nerve damage due to positioning  - sore throat or hoarseness - Damage to heart, brain, nerves, lungs, other parts of body or loss of life  Patient voiced understanding and assent.)         Anesthesia Quick Evaluation  "

## 2024-10-31 NOTE — Plan of Care (Signed)
 " Problem: Education: Goal: Knowledge of General Education information will improve Description: Including pain rating scale, medication(s)/side effects and non-pharmacologic comfort measures Outcome: Progressing   Problem: Health Behavior/Discharge Planning: Goal: Ability to manage health-related needs will improve Outcome: Progressing   Problem: Clinical Measurements: Goal: Ability to maintain clinical measurements within normal limits will improve Outcome: Progressing Goal: Will remain free from infection Outcome: Progressing Goal: Diagnostic test results will improve Outcome: Progressing Goal: Respiratory complications will improve Outcome: Progressing Goal: Cardiovascular complication will be avoided Outcome: Progressing   Problem: Activity: Goal: Risk for activity intolerance will decrease Outcome: Progressing   Problem: Nutrition: Goal: Adequate nutrition will be maintained Outcome: Progressing   Problem: Coping: Goal: Level of anxiety will decrease Outcome: Progressing   Problem: Elimination: Goal: Will not experience complications related to bowel motility Outcome: Progressing Goal: Will not experience complications related to urinary retention Outcome: Progressing   Problem: Pain Managment: Goal: General experience of comfort will improve and/or be controlled Outcome: Progressing   Problem: Safety: Goal: Ability to remain free from injury will improve Outcome: Progressing   Problem: Skin Integrity: Goal: Risk for impaired skin integrity will decrease Outcome: Progressing   Problem: Education: Goal: Knowledge of disease or condition will improve Outcome: Progressing Goal: Knowledge of the prescribed therapeutic regimen will improve Outcome: Progressing Goal: Individualized Educational Video(s) Outcome: Progressing   Problem: Activity: Goal: Ability to tolerate increased activity will improve Outcome: Progressing Goal: Will verbalize the  importance of balancing activity with adequate rest periods Outcome: Progressing   Problem: Respiratory: Goal: Ability to maintain a clear airway will improve Outcome: Progressing Goal: Levels of oxygenation will improve Outcome: Progressing Goal: Ability to maintain adequate ventilation will improve Outcome: Progressing   Problem: Education: Goal: Knowledge of disease or condition will improve Outcome: Progressing Goal: Understanding of medication regimen will improve Outcome: Progressing Goal: Individualized Educational Video(s) Outcome: Progressing   Problem: Activity: Goal: Ability to tolerate increased activity will improve Outcome: Progressing   Problem: Cardiac: Goal: Ability to achieve and maintain adequate cardiopulmonary perfusion will improve Outcome: Progressing   Problem: Health Behavior/Discharge Planning: Goal: Ability to safely manage health-related needs after discharge will improve Outcome: Progressing   Problem: Education: Goal: Ability to demonstrate management of disease process will improve Outcome: Progressing Goal: Ability to verbalize understanding of medication therapies will improve Outcome: Progressing Goal: Individualized Educational Video(s) Outcome: Progressing   Problem: Activity: Goal: Capacity to carry out activities will improve Outcome: Progressing   Problem: Cardiac: Goal: Ability to achieve and maintain adequate cardiopulmonary perfusion will improve Outcome: Progressing   Problem: Education: Goal: Ability to describe self-care measures that may prevent or decrease complications (Diabetes Survival Skills Education) will improve Outcome: Progressing Goal: Individualized Educational Video(s) Outcome: Progressing   Problem: Coping: Goal: Ability to adjust to condition or change in health will improve Outcome: Progressing   Problem: Fluid Volume: Goal: Ability to maintain a balanced intake and output will improve Outcome:  Progressing   Problem: Health Behavior/Discharge Planning: Goal: Ability to identify and utilize available resources and services will improve Outcome: Progressing Goal: Ability to manage health-related needs will improve Outcome: Progressing   Problem: Metabolic: Goal: Ability to maintain appropriate glucose levels will improve Outcome: Progressing   Problem: Nutritional: Goal: Maintenance of adequate nutrition will improve Outcome: Progressing Goal: Progress toward achieving an optimal weight will improve Outcome: Progressing   Problem: Skin Integrity: Goal: Risk for impaired skin integrity will decrease Outcome: Progressing   Problem: Tissue Perfusion: Goal: Adequacy of tissue perfusion will improve Outcome:  Progressing   "

## 2024-10-31 NOTE — Telephone Encounter (Signed)
 Patient Product/process Development Scientist completed.    The patient is insured through [facility]. Patient has Medicare and is not eligible for a copay card, but may be able to apply for patient assistance or Medicare RX Payment Plan (Patient Must reach out to their plan, if eligible for payment plan), if available.  Ran test claim for Farxiga 10 mg and the current 30 day co-pay is $0.00.  Ran test claim for Jardiance 10 mg and the current 30 day co-pay is $0.00.    This test claim was processed through [facility]- copay amounts may vary at other pharmacies due to pharmacy/plan contracts, or as the patient moves through the different stages of their insurance plan.     Reyes Sharps, CPHT Pharmacy Technician Patient Advocate Specialist Lead [facility] Patient Advocate Team Direct Number: [phone]  Fax: [phone]

## 2024-10-31 NOTE — Progress Notes (Signed)
 "  Rounding Note   Patient Name: Bryan Kelley Date of Encounter: 10/31/2024  [facility] Cardiologist: Alm Clay, MD   Subjective Patient reports continued improvement in breathing. He remains in atrial flutter with rate 60s-90s bpm. Plan for cardioversion this afternoon. He should remain NPO until that time.   Scheduled Meds:  amiodarone   400 mg Oral BID   apixaban   5 mg Oral BID   arformoterol   15 mcg Nebulization BID   artificial tears  1 drop Both Eyes BID   atorvastatin   80 mg Oral Daily   brimonidine   1 drop Left Eye BID   brinzolamide   2 drop Left Eye BID   budesonide  (PULMICORT ) nebulizer solution  0.25 mg Nebulization BID   docusate sodium   100 mg Oral BID   doxycycline   100 mg Oral Q12H   folic acid   1 mg Oral Daily   furosemide   40 mg Oral Daily   insulin  aspart  0-5 Units Subcutaneous QHS   insulin  aspart  0-9 Units Subcutaneous TID WC   ipratropium-albuterol   3 mL Nebulization Q6H   montelukast   10 mg Oral QHS   pantoprazole   20 mg Oral Daily   polyethylene glycol  17 g Oral Daily   predniSONE   40 mg Oral Q breakfast   tamsulosin   0.4 mg Oral Daily   thiamine   100 mg Oral Daily   Continuous Infusions:  sodium chloride  20 mL/hr at 10/31/24 9388   diltiazem  (CARDIZEM ) infusion 15 mg/hr (10/31/24 0501)   PRN Meds: acetaminophen , albuterol , dextromethorphan -guaiFENesin , diphenhydrAMINE , hydrALAZINE , metoprolol  tartrate, mouth rinse, oxyCODONE -acetaminophen    Vital Signs  Vitals:   10/31/24 0233 10/31/24 0300 10/31/24 0500 10/31/24 0800  BP:  123/73  134/83  Pulse: 86   78  Resp:    19  Temp:  97.8 F (36.6 C)  (!) 97 F (36.1 C)  TempSrc:  Oral  Axillary  SpO2: 100%   100%  Weight:   82.3 kg   Height:        Intake/Output Summary (Last 24 hours) at 10/31/2024 0847 Last data filed at 10/30/2024 1900 Gross per 24 hour  Intake 200 ml  Output 500 ml  Net -300 ml      10/31/2024    5:00 AM 10/30/2024    5:00 AM 10/29/2024    3:48  AM  Last 3 Weights  Weight (lbs) 181 lb 7 oz 178 lb 2.1 oz 170 lb 13.7 oz  Weight (kg) 82.3 kg 80.8 kg 77.5 kg      Telemetry Atrial flutter, rate 60s-90s bpm - Personally Reviewed  Physical Exam  GEN: No acute distress.   Neck: No JVD Cardiac: IRIR; no murmurs, rubs, or gallops.  Respiratory: Diffuse expiratory wheezing GI: Soft, nontender, non-distended  MS: No edema; No deformity. Neuro:  Nonfocal  Psych: Normal affect   Labs High Sensitivity Troponin:  No results for input(s): TROPONINIHS in the last 720 hours.  Recent Labs  Lab 10/01/24 1600 10/01/24 1809 10/26/24 2100  TRNPT 26* 22* 18       Chemistry Recent Labs  Lab 10/26/24 2100 10/27/24 0526 10/29/24 0327 10/30/24 0342  NA 137 137 139 141  K 3.7 4.7 4.5 4.0  CL 95* 96* 97* 96*  CO2 33* 32 37* 40*  GLUCOSE 144* 315* 222* 194*  BUN 11 13 24* 22  CREATININE 0.76 0.74 0.86 0.87  CALCIUM  8.7* 8.9 10.0 9.8  MG 1.9  --   --   --   GFRNONAA >  60 >60 >60 >60  ANIONGAP 9 10 6 5     Lipids No results for input(s): CHOL, TRIG, HDL, LABVLDL, LDLCALC, CHOLHDL in the last 168 hours.  Hematology Recent Labs  Lab 10/27/24 0526 10/29/24 0327 10/30/24 0342  WBC 7.7 16.4* 13.2*  RBC 3.74* 3.83* 3.95*  HGB 10.5* 10.6* 11.0*  HCT 33.6* 34.3* 35.6*  MCV 89.8 89.6 90.1  MCH 28.1 27.7 27.8  MCHC 31.3 30.9 30.9  RDW 13.8 14.0 14.1  PLT 311 357 381   Thyroid  No results for input(s): TSH, FREET4 in the last 168 hours.  BNP Recent Labs  Lab 10/26/24 2100  PROBNP 952.0*    DDimer No results for input(s): DDIMER in the last 168 hours.   Radiology  No results found.  Cardiac Studies  08/2024 TEE 1. Left ventricular ejection fraction, by estimation, is 60 to 65%. The  left ventricle has normal function.   2. No left atrial/left atrial appendage thrombus was detected. The LAA  emptying velocity was 40 cm/s.   07/2024 Echo complete 1. Left ventricular ejection fraction, by estimation, is  65 to 70%. The  left ventricle has normal function. The left ventricle has no regional  wall motion abnormalities. Left ventricular diastolic parameters are  consistent with Grade I diastolic  dysfunction (impaired relaxation).   2. Right ventricular systolic function is normal. The right ventricular  size is normal.   3. The mitral valve is normal in structure. No evidence of mitral valve  regurgitation.   4. The aortic valve was not well visualized. Aortic valve regurgitation  is not visualized.   5. The inferior vena cava is normal in size with greater than 50%  respiratory variability, suggesting right atrial pressure of 3 mmHg.   07/2024 LHC   Mid LAD lesion is 50% stenosed with 90% stenosed side branch in 2nd Diag.   Ost RCA to Prox RCA lesion is 30% stenosed. Prox RCA lesion is 30% stenosed. Prox RCA to Mid RCA lesion is 25% stenosed.   There is hyperdynamic left ventricular systolic function.  The left ventricular ejection fraction is greater than 65% by visual estimate. LV end diastolic pressure is severely elevated.   A-fib/flutter with RVR developed upon completion of procedure => flutter/fib waves noted with IV adenosine  12 mg => rate improved with 10 mg IV diltiazem   Patient Profile   68 y.o. male with a history of persistent atrial flutter s/p cardioversion x 2 in fall 2025, COPD, remote tobacco abuse, CAD, diastolic dysfunction, and BPH who was admitted 12/24 in the setting of worsening shortness of breath with COPD exacertaion, and who is being seen for persistent atrial fibrillation.  Assessment & Plan   Persistent atrial flutter - History of persistent atrial flutter requiring DCCV x2 in the past year - Most recent echo 08/2024 with EF 60-65% - In the setting of COPD exacerbation - HR well controlled on diltiazem  drip - No missed doses of Eliquis . Continue Eliquis  5 mg twice daily.  - Plan for DCCV this afternoon - Continue oral amiodarone  load - Recommend outpatient  evaluation by EP  COPD exacerbation - Near baseline O2 requirement - Ongoing management per IM  Acute on chronic diastolic heart failure - Most recent echo 08/2024 with EF 60-65% - Appears euvolemic on exam - Continue oral Lasix  40 mg daily  Coronary artery disease - LHC 07/2024 with 50 mid LAD lesion and 90% stenosis of side branch in 2nd diag with medical management recommended - Denies chest pain -  Continue Eliquis  in place of aspirin and atorvastatin    Informed Consent   Shared Decision Making/Informed Consent The risks (stroke, cardiac arrhythmias rarely resulting in the need for a temporary or permanent pacemaker, skin irritation or burns and complications associated with conscious sedation including aspiration, arrhythmia, respiratory failure and death), benefits (restoration of normal sinus rhythm) and alternatives of a direct current cardioversion were explained in detail to Mr. Lipe and he agrees to proceed.        For questions or updates, please contact [facility] Please consult [email or web address] for contact info under       Signed, Lesley LITTIE Maffucci, PA-C  10/31/2024, 8:47 AM    "

## 2024-10-31 NOTE — Progress Notes (Signed)
" ° ° °  PROCEDURAL EXPEDITER PROGRESS NOTE  Patient Name: Bryan Kelley  DOB:1956-02-07 Date of Admission: 10/26/2024  Date of Assessment:10/31/2024   -------------------------------------------------------------------------------------------------------------------   Brief clinical summary: pt  has a HX of A-flutters/p cardioversionx2 in fall of 2025, COPD, CAD, Dm and persistant A-fib.  Scheduled for cardioversion today .  Orders in place:  Yes   Communication with surgical team if no orders: n/a  Labs, test, and orders reviewed: Y  Requires surgical clearance:  No  What type of clearance: n/a  Clearance received: n/a  Barriers noted:n/a   Intervention provided by [facility] team: n/a  Barrier resolved: n/a not applicable   -------------------------------------------------------------------------------------------------------------------  [employer], Ronal DELENA Bald Please contact us  directly via secure chat (search for [facility]) or by calling us  at [phone] [facility]).  "

## 2024-10-31 NOTE — TOC Initial Note (Signed)
 Transition of Care [facility]) - Initial/Assessment Note    Patient Details  Name: Bryan Kelley MRN: [id number] Date of Birth: [birth year]  [facility] CM/SW Contact:    Alfonso Rummer, LCSW Phone Number: 10/31/2024, 5:09 PM  Clinical Narrative:                  [name] Rummer completed TOC visit. Pt reports sister lives with him and provides support. Pt reports he uses [facility] and need medication packaged for visually impaired. Pt open to home health care nursing to assist with managing medication.        Patient Goals and CMS Choice            Expected Discharge Plan and Services                                              Prior Living Arrangements/Services                       Activities of Daily Living   ADL Screening (condition at time of admission) Independently performs ADLs?: Yes (appropriate for developmental age) Is the patient deaf or have difficulty hearing?: No Does the patient have difficulty seeing, even when wearing glasses/contacts?: Yes Does the patient have difficulty concentrating, remembering, or making decisions?: No  Permission Sought/Granted                  Emotional Assessment              Admission diagnosis:  COPD exacerbation (HCC) [J44.1] Atrial fibrillation with RVR (HCC) [I48.91] Patient Active Problem List   Diagnosis Date Noted   Coronary artery disease without angina pectoris 10/29/2024   COPD exacerbation (HCC) 10/26/2024   Chronic diastolic CHF (congestive heart failure) (HCC) 10/26/2024   Acute on chronic respiratory failure with hypoxia (HCC) 10/26/2024   Atrial flutter with rapid ventricular response (HCC) 10/26/2024   HLD (hyperlipidemia) 10/26/2024   Normocytic anemia 10/26/2024   Overweight (BMI 25.0-29.9) 10/26/2024   Pleuritic chest pain 10/26/2024   Typical atrial flutter (HCC) 10/26/2024   Pneumonia of left lower lobe due to infectious organism 10/02/2024   Atrial  fibrillation with RVR (HCC) 10/01/2024   Atrial flutter (HCC) 09/15/2024   Hyperlipidemia 09/15/2024   SOB (shortness of breath) 09/15/2024   Non compliance w medication regimen 08/11/2024   Coronary artery disease involving native heart without angina pectoris 07/30/2024   Generalized abdominal pain 07/30/2024   Subclinical hyperthyroidism 07/29/2024   Constipation 07/29/2024   Chest Pain S/P  emergent cardiac catheterization 07/28/2024 --STEMI ruled out 07/28/2024   CAD , nonobstructive on cath 07/28/2024 07/28/2024   Essential hypertension 11/13/2023   GERD without esophagitis 11/13/2023   Chronic hypoxemic respiratory failure (HCC) 07/04/2023   Hypocalcemia    Hypokalemia    Glaucoma    Malnutrition of moderate degree 02/20/2021   Personal history of tobacco use, presenting hazards to health 07/25/2020   Nausea vomiting and diarrhea 12/06/2019   COPD (chronic obstructive pulmonary disease) (HCC) 10/30/2019   Epigastric abdominal pain 08/12/2012   Alcohol  abuse 03/14/2012   NICOTINE  ADDICTION 10/29/2009   ERECTILE DYSFUNCTION, NON-ORGANIC 05/04/2009   ANKLE PAIN, RIGHT 10/04/2008   FATIGUE 10/04/2008   Asthma 02/17/2008   GERD (gastroesophageal reflux disease) 02/17/2008   BPH (benign prostatic hyperplasia) 02/17/2008   PCP:  [facility] Pharmacy:   [id number] - [place], [place] - [address] [place] [address] Phone: [phone] Fax: [phone]     Social Drivers of Health (SDOH) Social History: SDOH Screenings   Food Insecurity: No Food Insecurity (10/27/2024)  Housing: Low Risk (10/27/2024)  Transportation Needs: No Transportation Needs (10/27/2024)  Utilities: Not At Risk (10/27/2024)  Social Connections: Unknown (10/27/2024)  Tobacco Use: Medium Risk (10/26/2024)   SDOH Interventions:     Readmission Risk Interventions    10/28/2024   11:01 AM 10/03/2024    4:46 PM  Readmission Risk Prevention Plan   Transportation Screening Complete Complete  PCP or Specialist Appt within 3-5 Days  --  HRI or Home Care Consult  Complete  Social Work Consult for Recovery Care Planning/Counseling  Complete  Palliative Care Screening  Not Applicable  Medication Review [employer]) Complete Complete  PCP or Specialist appointment within 3-5 days of discharge Complete   HRI or Home Care Consult Complete   SW Recovery Care/Counseling Consult Complete   Palliative Care Screening Not Applicable   Skilled Nursing Facility Not Applicable

## 2024-10-31 NOTE — Progress Notes (Signed)
 " PROGRESS NOTE    Bryan Kelley  FMW:[id number] DOB: January 05, 1956 DOA: 10/26/2024 PCP: [facility]    Brief Narrative:  Bryan Kelley is a 68 y.o. male with medical history significant of COPD on 3 L oxygen , A-fib/flutter on Eliquis , HTN, HLD, CAD, dCHF, BPH, former smoker, alcohol  abuse, anemia, who presents with SOB and chest pain. Patient was recently hospitalized from 11/29 - 12/3 due to COPD exacerbation, pneumonia and A-fib with RVR. Pt completed her course of doxycycline  and Vantin  after discharge.  In the emergency room patient was found to have A-fib with RVR with rates up to 150s.       Assessment & Plan:   Principal Problem:   Typical atrial flutter (HCC) Active Problems:   COPD exacerbation (HCC)   Acute on chronic respiratory failure with hypoxia (HCC)   Pleuritic chest pain   Chronic diastolic CHF (congestive heart failure) (HCC)   Atrial flutter with rapid ventricular response (HCC)   Essential hypertension   CAD , nonobstructive on cath 07/28/2024   HLD (hyperlipidemia)   Normocytic anemia   BPH (benign prostatic hyperplasia)   Alcohol  abuse   Overweight (BMI 25.0-29.9)   Coronary artery disease without angina pectoris  Acute on chronic respiratory failure with hypoxia due to COPD exacerbation:  Patient with baseline 3 to 4 L requirement.  Acute respiratory distress has improved.  Patient did not tolerate BiPAP Plan: Continue steroids Continue doxycycline  Scheduled and as needed bronchodilators Add inhaled corticosteroid Wean oxygen  as tolerated Goal saturation 88-92%    Pleuritic chest pain:  CTA did not show PE Chest pain resolved   Chronic diastolic CHF (congestive heart failure) (HCC):  2D echo on 08/16/2024 showed EF of 60 to 85%. Has trace leg edema, with mildly elevated BNP 952, does not seem to have CHF exacerbation. Plan: Continue oral Lasix  Continue to monitor input and output Cardiology is on board and case discussed    Atrial flutter with rapid ventricular response (HCC): Likely triggered by COPD exacerbation. Patient has a history of refractory atrial fibrillation requiring cardioversion Plan: Continue Cardizem  drip Continue oral amiodarone  Continue Eliquis  5 mg twice daily DC cardioversion today EP follow-up as outpatient   Essential hypertension -On Cardizem  drip Continue Lasix  as above   CAD , nonobstructive on cath 07/28/2024:  Patient has pleuritic chest pain.  Now resolved.  Troponins flat.  Continue statin Continue Eliquis    HLD (hyperlipidemia) Continue Lipitor    Normocytic anemia:  Hemoglobin stable.  Continue to monitor CBC closely   BPH (benign prostatic hyperplasia) - Continue Flomax    Alcohol  abuse:  Monitor for withdrawal, none noted Continue folic acid  and vitamin B-1   Overweight (BMI 25.0-29.9): BMI 27.6 Counseled on weight loss   Prediabetes with hyperglycemia Patient's last A1c 3 months ago was 5.9 Repeat hemoglobin on 12/26 7.0 Continue insulin  therapy while on steroids Monitor glucose closely  DVT prophylaxis: Eliquis  Code Status: Full Family Communication: None Disposition Plan: Status is: Inpatient Remains inpatient appropriate because: Atrial fibrillation.  Plan for DC cardioversion today.  If respiratory status improved anticipate discharge 12/30   Level of care: Progressive  Consultants:  Cardiology  Procedures:  None  Antimicrobials: Doxycycline    Subjective: Seen and examined.  Resting in bed.  No visible distress.  No complaints of pain.  Reports shortness of breath is improving.  Objective: Vitals:   10/31/24 1246 10/31/24 1247 10/31/24 1248 10/31/24 1249  BP:   108/87   Pulse: 76 78 79 80  Resp:  20 (!) 22 20 14   Temp:      TempSrc:      SpO2: 91% 95% 97% 98%  Weight:      Height:        Intake/Output Summary (Last 24 hours) at 10/31/2024 1301 Last data filed at 10/31/2024 1232 Gross per 24 hour  Intake 200 ml  Output  625 ml  Net -425 ml   Filed Weights   10/29/24 0348 10/30/24 0500 10/31/24 0500  Weight: 77.5 kg 80.8 kg 82.3 kg    Examination:  General exam: No acute distress Respiratory system: Equal air entry bilaterally.  Bibasilar wheeze.  Normal work of breathing.  2 L Cardiovascular system: S1-S2, regular rate, irregular rhythm, no murmurs, no pedal edema Gastrointestinal system: Soft, NT/ND, normal bowel sounds Central nervous system: Alert and oriented. No focal neurological deficits. Extremities: Symmetric 5 x 5 power. Skin: No rashes, lesions or ulcers Psychiatry: Judgement and insight appear normal. Mood & affect appropriate.     Data Reviewed: I have personally reviewed following labs and imaging studies  CBC: Recent Labs  Lab 10/26/24 2100 10/27/24 0526 10/29/24 0327 10/30/24 0342  WBC 8.2 7.7 16.4* 13.2*  NEUTROABS  --   --  14.5* 10.3*  HGB 10.9* 10.5* 10.6* 11.0*  HCT 34.6* 33.6* 34.3* 35.6*  MCV 88.3 89.8 89.6 90.1  PLT 316 311 357 381   Basic Metabolic Panel: Recent Labs  Lab 10/26/24 2100 10/27/24 0526 10/29/24 0327 10/30/24 0342  NA 137 137 139 141  K 3.7 4.7 4.5 4.0  CL 95* 96* 97* 96*  CO2 33* 32 37* 40*  GLUCOSE 144* 315* 222* 194*  BUN 11 13 24* 22  CREATININE 0.76 0.74 0.86 0.87  CALCIUM  8.7* 8.9 10.0 9.8  MG 1.9  --   --   --   PHOS 2.7  --   --   --    GFR: Estimated Creatinine Clearance: 83 mL/min (by C-G formula based on SCr of 0.87 mg/dL). Liver Function Tests: No results for input(s): AST, ALT, ALKPHOS, BILITOT, PROT, ALBUMIN in the last 168 hours. No results for input(s): LIPASE, AMYLASE in the last 168 hours. No results for input(s): AMMONIA in the last 168 hours. Coagulation Profile: No results for input(s): INR, PROTIME in the last 168 hours. Cardiac Enzymes: No results for input(s): CKTOTAL, CKMB, CKMBINDEX, TROPONINI in the last 168 hours. BNP (last 3 results) Recent Labs    10/26/24 2100   PROBNP 952.0*   HbA1C: Recent Labs    10/28/24 1559  HGBA1C 7.0*   CBG: Recent Labs  Lab 10/30/24 0757 10/30/24 1220 10/30/24 1700 10/30/24 2147 10/31/24 0805  GLUCAP 170* 169* 286* 288* 147*   Lipid Profile: No results for input(s): CHOL, HDL, LDLCALC, TRIG, CHOLHDL, LDLDIRECT in the last 72 hours. Thyroid  Function Tests: No results for input(s): TSH, T4TOTAL, FREET4, T3FREE, THYROIDAB in the last 72 hours. Anemia Panel: No results for input(s): VITAMINB12, FOLATE, FERRITIN, TIBC, IRON, RETICCTPCT in the last 72 hours. Sepsis Labs: No results for input(s): PROCALCITON, LATICACIDVEN in the last 168 hours.  Recent Results (from the past 240 hours)  Resp panel by RT-PCR (RSV, Flu A&B, Covid) Anterior Nasal Swab     Status: None   Collection Time: 10/26/24 11:14 PM   Specimen: Anterior Nasal Swab  Result Value Ref Range Status   SARS Coronavirus 2 by RT PCR NEGATIVE NEGATIVE Final    Comment: (NOTE) SARS-CoV-2 target nucleic acids are NOT DETECTED.  The SARS-CoV-2 RNA is  generally detectable in upper respiratory specimens during the acute phase of infection. The lowest concentration of SARS-CoV-2 viral copies this assay can detect is 138 copies/mL. A negative result does not preclude SARS-Cov-2 infection and should not be used as the sole basis for treatment or other patient management decisions. A negative result may occur with  improper specimen collection/handling, submission of specimen other than nasopharyngeal swab, presence of viral mutation(s) within the areas targeted by this assay, and inadequate number of viral copies(<138 copies/mL). A negative result must be combined with clinical observations, patient history, and epidemiological information. The expected result is Negative.  Fact Sheet for Patients:  bloggercourse.com  Fact Sheet for Healthcare Providers:   seriousbroker.it  This test is no t yet approved or cleared by the [place]  FDA and  has been authorized for detection and/or diagnosis of SARS-CoV-2 by FDA under an Emergency Use Authorization (EUA). This EUA will remain  in effect (meaning this test can be used) for the duration of the COVID-19 declaration under Section 564(b)(1) of the Act, 21 U.S.C.section 360bbb-3(b)(1), unless the authorization is terminated  or revoked sooner.       Influenza A by PCR NEGATIVE NEGATIVE Final   Influenza B by PCR NEGATIVE NEGATIVE Final    Comment: (NOTE) The Xpert Xpress SARS-CoV-2/FLU/RSV plus assay is intended as an aid in the diagnosis of influenza from Nasopharyngeal swab specimens and should not be used as a sole basis for treatment. Nasal washings and aspirates are unacceptable for Xpert Xpress SARS-CoV-2/FLU/RSV testing.  Fact Sheet for Patients: bloggercourse.com  Fact Sheet for Healthcare Providers: seriousbroker.it  This test is not yet approved or cleared by the [place]  FDA and has been authorized for detection and/or diagnosis of SARS-CoV-2 by FDA under an Emergency Use Authorization (EUA). This EUA will remain in effect (meaning this test can be used) for the duration of the COVID-19 declaration under Section 564(b)(1) of the Act, 21 U.S.C. section 360bbb-3(b)(1), unless the authorization is terminated or revoked.     Resp Syncytial Virus by PCR NEGATIVE NEGATIVE Final    Comment: (NOTE) Fact Sheet for Patients: bloggercourse.com  Fact Sheet for Healthcare Providers: seriousbroker.it  This test is not yet approved or cleared by the [place]  FDA and has been authorized for detection and/or diagnosis of SARS-CoV-2 by FDA under an Emergency Use Authorization (EUA). This EUA will remain in effect (meaning this test can be used) for  the duration of the COVID-19 declaration under Section 564(b)(1) of the Act, 21 U.S.C. section 360bbb-3(b)(1), unless the authorization is terminated or revoked.  Performed at [facility], [address] [place], [place] [address]   Expectorated Sputum Assessment w Gram Stain, Rflx to Resp Cult     Status: None   Collection Time: 10/27/24  1:11 AM   Specimen: Expectorated Sputum  Result Value Ref Range Status   Specimen Description EXPECTORATED SPUTUM  Final   Special Requests NONE  Final   Sputum evaluation   Final    THIS SPECIMEN IS ACCEPTABLE FOR SPUTUM CULTURE Performed at [facility], [address] [place], [place] [address]    Report Status 10/27/2024 FINAL  Final  Culture, Respiratory w Gram Stain     Status: None   Collection Time: 10/27/24  1:11 AM  Result Value Ref Range Status   Specimen Description   Final    EXPECTORATED SPUTUM Performed at [facility], [address], [place], [place] [address]    Special Requests  Final    NONE Reflexed from [id number] Performed at [facility], [address] [place], [place] [address]    Gram Stain   Final    RARE WBC PRESENT, PREDOMINANTLY PMN FEW GRAM POSITIVE COCCI RARE GRAM NEGATIVE RODS    Culture   Final    MODERATE Normal respiratory flora-no Staph aureus or Pseudomonas seen Performed at [facility], [address] [place], [place] [address]    Report Status 10/29/2024 FINAL  Final         Radiology Studies: No results found.      Scheduled Meds:  [MAR Hold] amiodarone   400 mg Oral BID   [MAR Hold] apixaban   5 mg Oral BID   [MAR Hold] arformoterol   15 mcg Nebulization BID   [MAR Hold] artificial tears  1 drop Both Eyes BID   [MAR Hold] atorvastatin   80 mg Oral Daily   [MAR Hold] brimonidine   1 drop Left Eye BID   [MAR Hold] brinzolamide   2 drop Left Eye BID   [MAR Hold] budesonide  (PULMICORT ) nebulizer solution  0.25 mg Nebulization BID   [MAR  Hold] dapagliflozin  propanediol  10 mg Oral Daily   [MAR Hold] docusate sodium   100 mg Oral BID   [MAR Hold] folic acid   1 mg Oral Daily   [MAR Hold] furosemide   40 mg Oral Daily   [MAR Hold] insulin  aspart  0-5 Units Subcutaneous QHS   [MAR Hold] insulin  aspart  0-9 Units Subcutaneous TID WC   [MAR Hold] ipratropium-albuterol   3 mL Nebulization Q6H   [MAR Hold] montelukast   10 mg Oral QHS   [MAR Hold] pantoprazole   20 mg Oral Daily   [MAR Hold] polyethylene glycol  17 g Oral Daily   [MAR Hold] predniSONE   40 mg Oral Q breakfast   [MAR Hold] tamsulosin   0.4 mg Oral Daily   [MAR Hold] thiamine   100 mg Oral Daily   Continuous Infusions:  sodium chloride  25 mL/hr at 10/31/24 1246   diltiazem  (CARDIZEM ) infusion 15 mg/hr (10/31/24 0501)     LOS: 5 days     Bryan KATHEE Robson, Bryan Kelley [facility]   If 7PM-7AM, please contact night-coverage  10/31/2024, 1:01 PM   "

## 2024-10-31 NOTE — Progress Notes (Signed)
 OT Cancellation Note  Patient Details Name: Bryan Kelley MRN: [id number] DOB: 1956/03/17   Cancelled Treatment:    Reason Eval/Treat Not Completed: Patient at procedure or test/ unavailable. OT will follow up as able.   Therisa Sheffield, OTD OTR/L  10/31/2024, 12:29 PM

## 2024-10-31 NOTE — CV Procedure (Signed)
Cardioversion procedure note For atrial fibrillation, persistent  Procedure Details:  Consent: Risks of procedure as well as the alternatives and risks of each were explained to the (patient/caregiver).  Consent for procedure obtained.  Time Out: Verified patient identification, verified procedure, site/side was marked, verified correct patient position, special equipment/implants available, medications/allergies/relevent history reviewed, required imaging and test results available.  Performed  Patient placed on cardiac monitor, pulse oximetry, supplemental oxygen as necessary.   Sedation given: propofol IV, Dr. Oliver Pacer pads placed anterior and posterior chest.   Cardioverted 1 time(s).   Cardioverted at  150 J. Synchronized biphasic Converted to NSR   Evaluation: Findings: Post procedure EKG shows: NSR Complications: None Patient did tolerate procedure well.  Time Spent Directly with the Patient:  45 minutes   Tim Betsie Peckman, M.D., Ph.D. 

## 2024-10-31 NOTE — Transfer of Care (Signed)
 Immediate Anesthesia Transfer of Care Note  Patient: Bryan Kelley  Procedure(s) Performed: CARDIOVERSION  Patient Location: PACU and Nursing Unit  Anesthesia Type:General  Level of Consciousness: drowsy and patient cooperative  Airway & Oxygen  Therapy: Patient Spontanous Breathing and Patient connected to nasal cannula oxygen   Post-op Assessment: Report given to RN and Post -op Vital signs reviewed and stable  Post vital signs: Reviewed and stable  Last Vitals:  Vitals Value Taken Time  BP 108/87 10/31/24 12:48  Temp    Pulse 80 10/31/24 12:49  Resp 14 10/31/24 12:49  SpO2 98 % 10/31/24 12:49    Last Pain:  Vitals:   10/31/24 1141  TempSrc: Temporal  PainSc: 0-No pain         Complications: No notable events documented.

## 2024-11-01 ENCOUNTER — Encounter: Payer: Self-pay | Admitting: Cardiovascular Disease

## 2024-11-01 DIAGNOSIS — I483 Typical atrial flutter: Secondary | ICD-10-CM | POA: Diagnosis not present

## 2024-11-01 DIAGNOSIS — J441 Chronic obstructive pulmonary disease with (acute) exacerbation: Secondary | ICD-10-CM | POA: Diagnosis not present

## 2024-11-01 LAB — GLUCOSE, CAPILLARY
Glucose-Capillary: 139 mg/dL — ABNORMAL HIGH (ref 70–99)
Glucose-Capillary: 160 mg/dL — ABNORMAL HIGH (ref 70–99)
Glucose-Capillary: 263 mg/dL — ABNORMAL HIGH (ref 70–99)
Glucose-Capillary: 206 mg/dL — ABNORMAL HIGH (ref 70–99)

## 2024-11-01 MED ORDER — AMIODARONE HCL 200 MG PO TABS
400.0000 mg | ORAL_TABLET | Freq: Two times a day (BID) | ORAL | Status: AC
  Administered 2024-11-01 – 2024-11-04 (×6): 400 mg via ORAL
  Filled 2024-11-01 (×6): qty 2

## 2024-11-01 MED ORDER — LIVING WELL WITH DIABETES BOOK
Freq: Once | Status: AC
  Filled 2024-11-01: qty 1

## 2024-11-01 MED ORDER — HYDROCOD POLI-CHLORPHE POLI ER 10-8 MG/5ML PO SUER
5.0000 mL | Freq: Two times a day (BID) | ORAL | Status: DC | PRN
  Administered 2024-11-01: 5 mL via ORAL
  Filled 2024-11-01: qty 5

## 2024-11-01 MED ORDER — AMIODARONE HCL 200 MG PO TABS: 200.0000 mg | ORAL_TABLET | Freq: Two times a day (BID) | ORAL | Status: DC

## 2024-11-01 MED ORDER — ROFLUMILAST 500 MCG PO TABS
500.0000 ug | ORAL_TABLET | Freq: Every day | ORAL | Status: DC
  Administered 2024-11-01 – 2024-11-04 (×4): 500 ug via ORAL
  Filled 2024-11-01 (×4): qty 1

## 2024-11-01 MED ORDER — AMIODARONE HCL 200 MG PO TABS: 200.0000 mg | ORAL_TABLET | Freq: Every day | ORAL | Status: DC

## 2024-11-01 MED ORDER — BENZONATATE 100 MG PO CAPS
200.0000 mg | ORAL_CAPSULE | Freq: Three times a day (TID) | ORAL | Status: DC
  Administered 2024-11-01 – 2024-11-03 (×9): 200 mg via ORAL
  Filled 2024-11-01 (×10): qty 2

## 2024-11-01 NOTE — Anesthesia Postprocedure Evaluation (Signed)
"   Anesthesia Post Note  Patient: Bryan Kelley  Procedure(s) Performed: CARDIOVERSION  Patient location during evaluation: Specials Recovery Anesthesia Type: General Level of consciousness: awake and alert Pain management: pain level controlled Vital Signs Assessment: post-procedure vital signs reviewed and stable Respiratory status: spontaneous breathing, nonlabored ventilation, respiratory function stable and patient connected to nasal cannula oxygen  Cardiovascular status: blood pressure returned to baseline and stable Postop Assessment: no apparent nausea or vomiting Anesthetic complications: no   No notable events documented.   Last Vitals:  Vitals:   11/01/24 0237 11/01/24 0449  BP:  130/80  Pulse:  77  Resp:  18  Temp:  36.7 C  SpO2: 99% 100%    Last Pain:  Vitals:   10/31/24 2015  TempSrc: Oral  PainSc: 0-No pain                 [name]      "

## 2024-11-01 NOTE — Progress Notes (Signed)
 PT Cancellation Note  Patient Details Name: Bryan Kelley MRN: [id number] DOB: 11/23/1955   Cancelled Treatment:    Reason Eval/Treat Not Completed: PT screened, no needs identified, will sign off (Alerted by OT that patient declined PT needs and is likely at or near baseline level of functional independence. No anticipated PT needs at this time.)  Bryan Kelley, PT, MPT   Bryan Kelley 11/01/2024, 11:05 AM

## 2024-11-01 NOTE — Progress Notes (Signed)
 "  Rounding Note   Patient Name: Bryan Kelley Date of Encounter: 11/01/2024  [facility] Cardiologist: Alm Clay, MD   Subjective Successful DCCV yesterday. Patient reports that he is not able to tell much difference in how he feels since conversion to sinus rhythm. Remains short of breath with wheezing. Denies chest pain and palpitations.   Scheduled Meds:  amiodarone   400 mg Oral BID   apixaban   5 mg Oral BID   arformoterol   15 mcg Nebulization BID   artificial tears  1 drop Both Eyes BID   atorvastatin   80 mg Oral Daily   brimonidine   1 drop Left Eye BID   brinzolamide   2 drop Left Eye BID   budesonide  (PULMICORT ) nebulizer solution  0.25 mg Nebulization BID   dapagliflozin  propanediol  10 mg Oral Daily   diltiazem   180 mg Oral QHS   docusate sodium   100 mg Oral BID   folic acid   1 mg Oral Daily   furosemide   40 mg Oral Daily   insulin  aspart  0-5 Units Subcutaneous QHS   insulin  aspart  0-9 Units Subcutaneous TID WC   ipratropium-albuterol   3 mL Nebulization Q6H   montelukast   10 mg Oral QHS   pantoprazole   20 mg Oral Daily   polyethylene glycol  17 g Oral Daily   tamsulosin   0.4 mg Oral Daily   thiamine   100 mg Oral Daily   Continuous Infusions:   PRN Meds: acetaminophen , albuterol , dextromethorphan -guaiFENesin , diphenhydrAMINE , hydrALAZINE , metoprolol  tartrate, mouth rinse, oxyCODONE -acetaminophen    Vital Signs  Vitals:   10/31/24 2350 11/01/24 0237 11/01/24 0449 11/01/24 0757  BP: 138/84  130/80 129/87  Pulse: 81  77 74  Resp: 18  18 18   Temp: 98.2 F (36.8 C)  98 F (36.7 C) 98.5 F (36.9 C)  TempSrc:    Oral  SpO2: 100% 99% 100% 100%  Weight:   75.2 kg   Height:        Intake/Output Summary (Last 24 hours) at 11/01/2024 0912 Last data filed at 11/01/2024 0452 Gross per 24 hour  Intake 240 ml  Output 1575 ml  Net -1335 ml      11/01/2024    4:49 AM 10/31/2024    5:00 AM 10/30/2024    5:00 AM  Last 3 Weights  Weight (lbs) 165  lb 12.6 oz 181 lb 7 oz 178 lb 2.1 oz  Weight (kg) 75.2 kg 82.3 kg 80.8 kg      Telemetry Sinus rhythm - Personally Reviewed  Physical Exam  GEN: No acute distress.   Neck: No JVD Cardiac: IRIR; no murmurs, rubs, or gallops.  Respiratory: Diffuse wheezing GI: Soft, nontender, non-distended  MS: No edema; No deformity. Neuro:  Nonfocal  Psych: Normal affect   Labs High Sensitivity Troponin:  No results for input(s): TROPONINIHS in the last 720 hours.  Recent Labs  Lab 10/26/24 2100  TRNPT 18       Chemistry Recent Labs  Lab 10/26/24 2100 10/27/24 0526 10/29/24 0327 10/30/24 0342  NA 137 137 139 141  K 3.7 4.7 4.5 4.0  CL 95* 96* 97* 96*  CO2 33* 32 37* 40*  GLUCOSE 144* 315* 222* 194*  BUN 11 13 24* 22  CREATININE 0.76 0.74 0.86 0.87  CALCIUM  8.7* 8.9 10.0 9.8  MG 1.9  --   --   --   GFRNONAA >60 >60 >60 >60  ANIONGAP 9 10 6 5     Lipids No results for input(s):  CHOL, TRIG, HDL, LABVLDL, LDLCALC, CHOLHDL in the last 168 hours.  Hematology Recent Labs  Lab 10/27/24 0526 10/29/24 0327 10/30/24 0342  WBC 7.7 16.4* 13.2*  RBC 3.74* 3.83* 3.95*  HGB 10.5* 10.6* 11.0*  HCT 33.6* 34.3* 35.6*  MCV 89.8 89.6 90.1  MCH 28.1 27.7 27.8  MCHC 31.3 30.9 30.9  RDW 13.8 14.0 14.1  PLT 311 357 381   Thyroid  No results for input(s): TSH, FREET4 in the last 168 hours.  BNP Recent Labs  Lab 10/26/24 2100  PROBNP 952.0*    DDimer No results for input(s): DDIMER in the last 168 hours.   Radiology  No results found.  Cardiac Studies  08/2024 TEE 1. Left ventricular ejection fraction, by estimation, is 60 to 65%. The  left ventricle has normal function.   2. No left atrial/left atrial appendage thrombus was detected. The LAA  emptying velocity was 40 cm/s.   07/2024 Echo complete 1. Left ventricular ejection fraction, by estimation, is 65 to 70%. The  left ventricle has normal function. The left ventricle has no regional  wall motion  abnormalities. Left ventricular diastolic parameters are  consistent with Grade I diastolic  dysfunction (impaired relaxation).   2. Right ventricular systolic function is normal. The right ventricular  size is normal.   3. The mitral valve is normal in structure. No evidence of mitral valve  regurgitation.   4. The aortic valve was not well visualized. Aortic valve regurgitation  is not visualized.   5. The inferior vena cava is normal in size with greater than 50%  respiratory variability, suggesting right atrial pressure of 3 mmHg.   07/2024 LHC   Mid LAD lesion is 50% stenosed with 90% stenosed side branch in 2nd Diag.   Ost RCA to Prox RCA lesion is 30% stenosed. Prox RCA lesion is 30% stenosed. Prox RCA to Mid RCA lesion is 25% stenosed.   There is hyperdynamic left ventricular systolic function.  The left ventricular ejection fraction is greater than 65% by visual estimate. LV end diastolic pressure is severely elevated.   A-fib/flutter with RVR developed upon completion of procedure => flutter/fib waves noted with IV adenosine  12 mg => rate improved with 10 mg IV diltiazem   Patient Profile   68 y.o. male with a history of persistent atrial flutter s/p cardioversion x 2 in fall 2025, COPD, remote tobacco abuse, CAD, diastolic dysfunction, and BPH who was admitted 12/24 in the setting of worsening shortness of breath with COPD exacertaion, and who is being seen for persistent atrial fibrillation.  Assessment & Plan   Persistent atrial flutter - History of persistent atrial flutter requiring DCCV x2 in the past year - Most recent echo 08/2024 with EF 60-65% - In the setting of COPD exacerbation - Continue Eliquis  5 mg twice daily.  - S/p successful DCCV yesterday - Transitioned to oral diltiazem  180 mg daily - Continue oral amiodarone  load - Recommend outpatient evaluation by EP  COPD exacerbation - Wheezing on exam - Ongoing management per IM  Acute on chronic diastolic  heart failure - Most recent echo 08/2024 with EF 60-65% - Appears euvolemic on exam - Continue oral Lasix  40 mg daily  Coronary artery disease - LHC 07/2024 with 50 mid LAD lesion and 90% stenosis of side branch in 2nd diag with medical management recommended - Denies chest pain - Continue Eliquis  in place of aspirin and atorvastatin      For questions or updates, please contact [facility] Please consult  [email or web address] for contact info under       Signed, Lesley LITTIE Maffucci, PA-C  11/01/2024, 9:12 AM    "

## 2024-11-01 NOTE — Progress Notes (Signed)
 " PROGRESS NOTE    Bryan Kelley  FMW:[id number] DOB: 07-Mar-1956 DOA: 10/26/2024 PCP: [facility]    Brief Narrative:  Bryan Kelley is a 68 y.o. male with medical history significant of COPD on 3 L oxygen , A-fib/flutter on Eliquis , HTN, HLD, CAD, dCHF, BPH, former smoker, alcohol  abuse, anemia, who presents with SOB and chest pain. Patient was recently hospitalized from 11/29 - 12/3 due to COPD exacerbation, pneumonia and A-fib with RVR. Pt completed her course of doxycycline  and Vantin  after discharge.  In the emergency room patient was found to have A-fib with RVR with rates up to 150s.       Assessment & Plan:   Principal Problem:   Typical atrial flutter (HCC) Active Problems:   COPD exacerbation (HCC)   Acute on chronic respiratory failure with hypoxia (HCC)   Pleuritic chest pain   Chronic diastolic CHF (congestive heart failure) (HCC)   Atrial flutter with rapid ventricular response (HCC)   Essential hypertension   CAD , nonobstructive on cath 07/28/2024   HLD (hyperlipidemia)   Normocytic anemia   BPH (benign prostatic hyperplasia)   Alcohol  abuse   Overweight (BMI 25.0-29.9)   Coronary artery disease without angina pectoris  Acute on chronic respiratory failure with hypoxia due to COPD exacerbation:  Patient with baseline 3 to 4 L requirement.  Acute respiratory distress has improved however patient is still wheezing and short of breath with minimal exertion. Plan: Completed steroids Completed doxycycline  Continue scheduled and as needed bronchodilators Add inhaled corticosteroid Add Daliresp  Wean oxygen  as tolerated Goal saturation 88-92%    Pleuritic chest pain:  CTA did not show PE Chest pain resolved   Chronic diastolic CHF (congestive heart failure) (HCC):  2D echo on 08/16/2024 showed EF of 60 to 85%. Has trace leg edema, with mildly elevated BNP 952, does not seem to have CHF exacerbation. Plan: Continue oral Lasix  Continue to monitor  input and output Cardiology following   Atrial flutter with rapid ventricular response (HCC): Likely triggered by COPD exacerbation. Patient has a history of refractory atrial fibrillation requiring cardioversion Status post accessible DC cardioversion on 12/29 Plan: New oral amiodarone  Continue Eliquis  5 mg twice daily EP follow-up as outpatient  Essential hypertension Oral Lasix    CAD , nonobstructive on cath 07/28/2024:  Patient has pleuritic chest pain.  Now resolved.  Troponins flat.  Continue statin Continue Eliquis    HLD (hyperlipidemia) Continue Lipitor    Normocytic anemia:  Hemoglobin stable.  Continue to monitor CBC closely   BPH (benign prostatic hyperplasia) - Continue Flomax    Alcohol  abuse:  Monitor for withdrawal, none noted Continue folic acid  and vitamin B-1   Overweight (BMI 25.0-29.9): BMI 27.6 Counseled on weight loss   Prediabetes with hyperglycemia Patient's last A1c 3 months ago was 5.9 Repeat hemoglobin on 12/26 7.0 Continue insulin  therapy while on steroids Monitor glucose closely DM coordinator engaged  DVT prophylaxis: Eliquis  Code Status: Full Family Communication: None Disposition Plan: Status is: Inpatient Remains inpatient appropriate because: Decompensated COPD   Level of care: Progressive  Consultants:  Cardiology  Procedures:  None  Antimicrobials:   Subjective: And examined.  Still coughing and short of breath.  Objective: Vitals:   11/01/24 0237 11/01/24 0449 11/01/24 0757 11/01/24 1208  BP:  130/80 129/87 (!) 141/75  Pulse:  77 74 80  Resp:  18 18 17   Temp:  98 F (36.7 C) 98.5 F (36.9 C) 97.7 F (36.5 C)  TempSrc:   Oral Oral  SpO2: 99% 100% 100% 99%  Weight:  75.2 kg    Height:        Intake/Output Summary (Last 24 hours) at 11/01/2024 1507 Last data filed at 11/01/2024 0452 Gross per 24 hour  Intake 240 ml  Output 950 ml  Net -710 ml   Filed Weights   10/30/24 0500 10/31/24 0500 11/01/24  0449  Weight: 80.8 kg 82.3 kg 75.2 kg    Examination:  General exam: In chair.  Appears fatigued Respiratory system: Scattered wheeze bilaterally.  Normal work of breathing.  4 L Cardiovascular system: S1-S2, regular rate and rhythm, no murmurs, no pedal edema Gastrointestinal system: Soft, NT/ND, normal bowel sounds Central nervous system: Alert and oriented. No focal neurological deficits. Extremities: Symmetric 5 x 5 power. Skin: No rashes, lesions or ulcers Psychiatry: Judgement and insight appear normal. Mood & affect appropriate.     Data Reviewed: I have personally reviewed following labs and imaging studies  CBC: Recent Labs  Lab 10/26/24 2100 10/27/24 0526 10/29/24 0327 10/30/24 0342  WBC 8.2 7.7 16.4* 13.2*  NEUTROABS  --   --  14.5* 10.3*  HGB 10.9* 10.5* 10.6* 11.0*  HCT 34.6* 33.6* 34.3* 35.6*  MCV 88.3 89.8 89.6 90.1  PLT 316 311 357 381   Basic Metabolic Panel: Recent Labs  Lab 10/26/24 2100 10/27/24 0526 10/29/24 0327 10/30/24 0342  NA 137 137 139 141  K 3.7 4.7 4.5 4.0  CL 95* 96* 97* 96*  CO2 33* 32 37* 40*  GLUCOSE 144* 315* 222* 194*  BUN 11 13 24* 22  CREATININE 0.76 0.74 0.86 0.87  CALCIUM  8.7* 8.9 10.0 9.8  MG 1.9  --   --   --   PHOS 2.7  --   --   --    GFR: Estimated Creatinine Clearance: 74.4 mL/min (by C-G formula based on SCr of 0.87 mg/dL). Liver Function Tests: No results for input(s): AST, ALT, ALKPHOS, BILITOT, PROT, ALBUMIN in the last 168 hours. No results for input(s): LIPASE, AMYLASE in the last 168 hours. No results for input(s): AMMONIA in the last 168 hours. Coagulation Profile: No results for input(s): INR, PROTIME in the last 168 hours. Cardiac Enzymes: No results for input(s): CKTOTAL, CKMB, CKMBINDEX, TROPONINI in the last 168 hours. BNP (last 3 results) Recent Labs    10/26/24 2100  PROBNP 952.0*   HbA1C: No results for input(s): HGBA1C in the last 72 hours.  CBG: Recent  Labs  Lab 10/31/24 0805 10/31/24 1734 10/31/24 2200 11/01/24 0759 11/01/24 1209  GLUCAP 147* 351* 243* 139* 160*   Lipid Profile: No results for input(s): CHOL, HDL, LDLCALC, TRIG, CHOLHDL, LDLDIRECT in the last 72 hours. Thyroid  Function Tests: No results for input(s): TSH, T4TOTAL, FREET4, T3FREE, THYROIDAB in the last 72 hours. Anemia Panel: No results for input(s): VITAMINB12, FOLATE, FERRITIN, TIBC, IRON, RETICCTPCT in the last 72 hours. Sepsis Labs: No results for input(s): PROCALCITON, LATICACIDVEN in the last 168 hours.  Recent Results (from the past 240 hours)  Resp panel by RT-PCR (RSV, Flu A&B, Covid) Anterior Nasal Swab     Status: None   Collection Time: 10/26/24 11:14 PM   Specimen: Anterior Nasal Swab  Result Value Ref Range Status   SARS Coronavirus 2 by RT PCR NEGATIVE NEGATIVE Final    Comment: (NOTE) SARS-CoV-2 target nucleic acids are NOT DETECTED.  The SARS-CoV-2 RNA is generally detectable in upper respiratory specimens during the acute phase of infection. The lowest concentration of SARS-CoV-2 viral copies this  assay can detect is 138 copies/mL. A negative result does not preclude SARS-Cov-2 infection and should not be used as the sole basis for treatment or other patient management decisions. A negative result may occur with  improper specimen collection/handling, submission of specimen other than nasopharyngeal swab, presence of viral mutation(s) within the areas targeted by this assay, and inadequate number of viral copies(<138 copies/mL). A negative result must be combined with clinical observations, patient history, and epidemiological information. The expected result is Negative.  Fact Sheet for Patients:  bloggercourse.com  Fact Sheet for Healthcare Providers:  seriousbroker.it  This test is no t yet approved or cleared by the [place]  FDA and  has  been authorized for detection and/or diagnosis of SARS-CoV-2 by FDA under an Emergency Use Authorization (EUA). This EUA will remain  in effect (meaning this test can be used) for the duration of the COVID-19 declaration under Section 564(b)(1) of the Act, 21 U.S.C.section 360bbb-3(b)(1), unless the authorization is terminated  or revoked sooner.       Influenza A by PCR NEGATIVE NEGATIVE Final   Influenza B by PCR NEGATIVE NEGATIVE Final    Comment: (NOTE) The Xpert Xpress SARS-CoV-2/FLU/RSV plus assay is intended as an aid in the diagnosis of influenza from Nasopharyngeal swab specimens and should not be used as a sole basis for treatment. Nasal washings and aspirates are unacceptable for Xpert Xpress SARS-CoV-2/FLU/RSV testing.  Fact Sheet for Patients: bloggercourse.com  Fact Sheet for Healthcare Providers: seriousbroker.it  This test is not yet approved or cleared by the [place]  FDA and has been authorized for detection and/or diagnosis of SARS-CoV-2 by FDA under an Emergency Use Authorization (EUA). This EUA will remain in effect (meaning this test can be used) for the duration of the COVID-19 declaration under Section 564(b)(1) of the Act, 21 U.S.C. section 360bbb-3(b)(1), unless the authorization is terminated or revoked.     Resp Syncytial Virus by PCR NEGATIVE NEGATIVE Final    Comment: (NOTE) Fact Sheet for Patients: bloggercourse.com  Fact Sheet for Healthcare Providers: seriousbroker.it  This test is not yet approved or cleared by the [place]  FDA and has been authorized for detection and/or diagnosis of SARS-CoV-2 by FDA under an Emergency Use Authorization (EUA). This EUA will remain in effect (meaning this test can be used) for the duration of the COVID-19 declaration under Section 564(b)(1) of the Act, 21 U.S.C. section 360bbb-3(b)(1), unless the  authorization is terminated or revoked.  Performed at [facility], [address] [place], [place] [address]   Expectorated Sputum Assessment w Gram Stain, Rflx to Resp Cult     Status: None   Collection Time: 10/27/24  1:11 AM   Specimen: Expectorated Sputum  Result Value Ref Range Status   Specimen Description EXPECTORATED SPUTUM  Final   Special Requests NONE  Final   Sputum evaluation   Final    THIS SPECIMEN IS ACCEPTABLE FOR SPUTUM CULTURE Performed at [facility], [address] [place], [place] [address]    Report Status 10/27/2024 FINAL  Final  Culture, Respiratory w Gram Stain     Status: None   Collection Time: 10/27/24  1:11 AM  Result Value Ref Range Status   Specimen Description   Final    EXPECTORATED SPUTUM Performed at [facility], [address] [place], [place] [address]    Special Requests   Final    NONE Reflexed from [id number] Performed at [facility], [address], [place], [place] [address]  Gram Stain   Final    RARE WBC PRESENT, PREDOMINANTLY PMN FEW GRAM POSITIVE COCCI RARE GRAM NEGATIVE RODS    Culture   Final    MODERATE Normal respiratory flora-no Staph aureus or Pseudomonas seen Performed at [facility], [address], [place], [place] [address]    Report Status 10/29/2024 FINAL  Final         Radiology Studies: No results found.      Scheduled Meds:  amiodarone   400 mg Oral BID   Followed by   NOREEN ON 11/04/2024] amiodarone   200 mg Oral BID   Followed by   NOREEN ON 11/12/2024] amiodarone   200 mg Oral Daily   apixaban   5 mg Oral BID   arformoterol   15 mcg Nebulization BID   artificial tears  1 drop Both Eyes BID   atorvastatin   80 mg Oral Daily   benzonatate   200 mg Oral TID   brimonidine   1 drop Left Eye BID   brinzolamide   2 drop Left Eye BID   budesonide  (PULMICORT ) nebulizer solution  0.25 mg Nebulization BID   dapagliflozin  propanediol  10 mg Oral Daily    diltiazem   180 mg Oral QHS   docusate sodium   100 mg Oral BID   folic acid   1 mg Oral Daily   furosemide   40 mg Oral Daily   insulin  aspart  0-5 Units Subcutaneous QHS   insulin  aspart  0-9 Units Subcutaneous TID WC   ipratropium-albuterol   3 mL Nebulization Q6H   living well with diabetes book   Does not apply Once   montelukast   10 mg Oral QHS   pantoprazole   20 mg Oral Daily   polyethylene glycol  17 g Oral Daily   roflumilast   500 mcg Oral Daily   tamsulosin   0.4 mg Oral Daily   thiamine   100 mg Oral Daily   Continuous Infusions:     LOS: 6 days     Calvin KATHEE Robson, MD [facility]   If 7PM-7AM, please contact night-coverage  11/01/2024, 3:07 PM   "

## 2024-11-01 NOTE — Inpatient Diabetes Management (Signed)
 Inpatient Diabetes Program Recommendations  AACE/ADA: New Consensus Statement on Inpatient Glycemic Control (2015)  Target Ranges:  Prepandial:   less than 140 mg/dL      Peak postprandial:   less than 180 mg/dL (1-2 hours)      Critically ill patients:  140 - 180 mg/dL   Lab Results  Component Value Date   GLUCAP 160 (H) 11/01/2024   HGBA1C 7.0 (H) 10/28/2024    Review of Glycemic Control  Diabetes history: New Onset DM2 Current orders for Inpatient glycemic control: Farxiga  10 mg daily Novolog  0-9 units tid, 0-5 units hs correction  Inpatient Diabetes Program Recommendations:   Noted patient new onset DM2 with A1c 7.0. Ordered Living Well With Diabetes booklet for review and consult to dietician. Will plan to see pt. During hospitalization.  Thank you, Kharson Rasmusson E. Braya Habermehl, RN, MSN, CNS, CDCES  Diabetes Coordinator Inpatient Glycemic Control Team Team Pager [phone] (8am-5pm) 11/01/2024 1:51 PM

## 2024-11-01 NOTE — Evaluation (Signed)
 Occupational Therapy Evaluation Patient Details Name: Bryan Kelley MRN: [id number] DOB: 09/09/1956 Today's Date: 11/01/2024   History of Present Illness   Bryan Kelley is a 68 y.o. male with medical history significant of COPD on 3 L oxygen , A-fib/flutter on Eliquis , HTN, HLD, CAD, dCHF, BPH, former smoker, alcohol  abuse, anemia, who presents with SOB and chest pain. Patient was recently hospitalized from 11/29 - 12/3 due to COPD exacerbation, pneumonia and A-fib with RVR. Admitted for Acute on chronic respiratory failure with hypoxia due to COPD exacerbation   Clinical Impressions Bryan Kelley was seen for OT evaluation this date. Prior to hospital admission, pt was MOD I using 3L [place]. Pt currently MOD I don shorts and standing grooming tasks using 3L [place], SpO2 87% on RA, resolved to 96% on 3L [place]. Educated on ECS, DME recs, and d/c recs. All education complete, no skilled acute OT needs identified, will sign. Upon hospital discharge, recommend no OT follow up.    If plan is discharge home, recommend the following:   Help with stairs or ramp for entrance     Functional Status Assessment   Patient has had a recent decline in their functional status and demonstrates the ability to make significant improvements in function in a reasonable and predictable amount of time.     Equipment Recommendations   None recommended by OT     Recommendations for Other Services         Precautions/Restrictions   Precautions Precautions: None Recall of Precautions/Restrictions: Intact Restrictions Weight Bearing Restrictions Per Provider Order: No     Mobility Bed Mobility Overal bed mobility: Independent                  Transfers Overall transfer level: Independent                        Balance Overall balance assessment: No apparent balance deficits (not formally assessed)                                         ADL either performed or assessed  with clinical judgement   ADL Overall ADL's : Modified independent                                       General ADL Comments: MOD I don shorts and standign grooming tasks using 3L [place],       Pertinent Vitals/Pain Pain Assessment Pain Assessment: No/denies pain     Extremity/Trunk Assessment Upper Extremity Assessment Upper Extremity Assessment: Overall WFL for tasks assessed   Lower Extremity Assessment Lower Extremity Assessment: Overall WFL for tasks assessed       Communication Communication Communication: No apparent difficulties   Cognition Arousal: Alert Behavior During Therapy: WFL for tasks assessed/performed Cognition: No apparent impairments                               Following commands: Intact       Cueing  General Comments      SpO2 87% on RA, resolved to 96% on 3L [place]   Exercises     Shoulder Instructions      Home Living Family/patient expects to be discharged to:: Private residence Living  Arrangements: Other relatives Available Help at Discharge: Family Type of Home: House Home Access: Stairs to enter Secretary/administrator of Steps: 3 Entrance Stairs-Rails: Right;Left;Can reach both Home Layout: One level               Home Equipment: Grab bars - tub/shower   Additional Comments: home O2      Prior Functioning/Environment Prior Level of Function : Independent/Modified Independent             Mobility Comments: ambulatory without AD, but cannot tolerate long distances ADLs Comments: independent; sister drives and completes IADLs    OT Problem List: Decreased activity tolerance   OT Treatment/Interventions:        OT Goals(Current goals can be found in the care plan section)   Acute Rehab OT Goals Patient Stated Goal: to go home OT Goal Formulation: With patient Time For Goal Achievement: 11/01/24 Potential to Achieve Goals: Good   OT Frequency:       Co-evaluation               AM-PAC OT 6 Clicks Daily Activity     Outcome Measure Help from another person eating meals?: None Help from another person taking care of personal grooming?: None Help from another person toileting, which includes using toliet, bedpan, or urinal?: None Help from another person bathing (including washing, rinsing, drying)?: None Help from another person to put on and taking off regular upper body clothing?: None Help from another person to put on and taking off regular lower body clothing?: None 6 Click Score: 24   End of Session Nurse Communication: Mobility status  Activity Tolerance: Patient tolerated treatment well Patient left: in chair;with call bell/phone within reach  OT Visit Diagnosis: Other abnormalities of gait and mobility (R26.89)                Time: 1000-1010 OT Time Calculation (min): 10 min Charges:  OT General Charges $OT Visit: 1 Visit OT Evaluation $OT Eval Low Complexity: 1 Low  Bryan Kelley, M.S. OTR/L  11/01/2024, 10:21 AM  ascom [phone]

## 2024-11-01 NOTE — Consult Note (Signed)
 I spoke to patient about different options for medication compliance and possibly an easier way to see the medications. I recommended getting medications filled in a blister pack and gave him two pharmacies that offer this service. I will see if our pharmacy can make font size bigger on prescription bottles.   Cathaleen Blanch, PharmD, BCPS

## 2024-11-02 ENCOUNTER — Telehealth: Payer: Self-pay

## 2024-11-02 DIAGNOSIS — I4891 Unspecified atrial fibrillation: Secondary | ICD-10-CM

## 2024-11-02 DIAGNOSIS — I483 Typical atrial flutter: Secondary | ICD-10-CM | POA: Diagnosis not present

## 2024-11-02 LAB — GLUCOSE, CAPILLARY
Glucose-Capillary: 130 mg/dL — ABNORMAL HIGH (ref 70–99)
Glucose-Capillary: 132 mg/dL — ABNORMAL HIGH (ref 70–99)
Glucose-Capillary: 100 mg/dL — ABNORMAL HIGH (ref 70–99)
Glucose-Capillary: 163 mg/dL — ABNORMAL HIGH (ref 70–99)

## 2024-11-02 MED ORDER — IPRATROPIUM-ALBUTEROL 0.5-2.5 (3) MG/3ML IN SOLN
3.0000 mL | Freq: Three times a day (TID) | RESPIRATORY_TRACT | Status: DC
  Administered 2024-11-02 (×2): 3 mL via RESPIRATORY_TRACT
  Filled 2024-11-02 (×3): qty 3

## 2024-11-02 MED ORDER — SENNOSIDES-DOCUSATE SODIUM 8.6-50 MG PO TABS
2.0000 | ORAL_TABLET | Freq: Once | ORAL | Status: AC
  Administered 2024-11-02: 2 via ORAL
  Filled 2024-11-02: qty 2

## 2024-11-02 MED ORDER — SODIUM CHLORIDE 0.9 % IV SOLN
500.0000 mg | INTRAVENOUS | Status: AC
  Administered 2024-11-02 – 2024-11-04 (×3): 500 mg via INTRAVENOUS
  Filled 2024-11-02 (×3): qty 5

## 2024-11-02 MED ORDER — GUAIFENESIN ER 600 MG PO TB12
600.0000 mg | ORAL_TABLET | Freq: Two times a day (BID) | ORAL | Status: DC
  Administered 2024-11-02 – 2024-11-04 (×4): 600 mg via ORAL
  Filled 2024-11-02 (×4): qty 1

## 2024-11-02 NOTE — Plan of Care (Signed)
 Nutrition Education Note   RD consulted for nutrition education regarding diabetes.   Lab Results  Component Value Date   HGBA1C 7.0 (H) 10/28/2024   PTA DM medications are none.   Labs reviewed: CBGS: [id number] (inpatient orders for glycemic control are Farxiga  10 mg daily and 0-9 units insulin  aspart TID, and 0-5 units insulin  aspart daily).  Case discussed with DM coordinator; NDES referral placed for outpatient education. Plan to discharge home on farxiga .   Spoke with patient, who was sitting in recliner chair at time of visit. Patient reports good appetite; he complains that items have been missing on his trays emergency planning/management officer check added). Patient reports he was surprised about DM diagnosis as he avoids breads, sweets, rice, and pasta. He did admit to occasionally eating potatoes and Sunny [place], but plans to stop this. He also recalled DM coordinator visit and was able to teachback information from visit. Patient reports that he plans to take a day off from medication and nip. RD discussed importance of being compliant with all medications how how not taking medications can affect blood sugar levels. RD also discussed how alcohol  consumption can affect glucose levels; discussed ways to incorporate alcohol  safely. Patient reports he drinks straight whiskey and avoids beer, wine, and mixed drinks.   RD provided Carbohydrate Counting for People with Diabetes and Plate Method handouts from the Academy of Nutrition and Dietetics. Discussed different food groups and their effects on blood sugar, emphasizing carbohydrate-containing foods. Provided list of carbohydrates and recommended serving sizes of common foods.  Discussed importance of controlled and consistent carbohydrate intake throughout the day. Provided examples of ways to balance meals/snacks and encouraged intake of high-fiber, whole grain complex carbohydrates. Teach back method used.  Expect fair compliance.  Current diet order is carb  modified, patient is consuming approximately 100% of meals at this time. Labs and medications reviewed. No further nutrition interventions warranted at this time. RD contact information provided. If additional nutrition issues arise, please re-consult RD.  Margery ORN, RD, LDN, CDCES Registered Dietitian III Certified Diabetes Care and Education Specialist If unable to reach this RD, please use RD Inpatient group chat on secure chat between hours of 8am-4 pm daily

## 2024-11-02 NOTE — Progress Notes (Signed)
 [facility] Progress Note  Patient: Bryan Kelley    FMW:[id number]  DOA: 10/26/2024     Date of Service: the patient was seen and examined on 11/02/2024  Chief Complaint  Patient presents with   Shortness of Breath   Brief hospital course: Bryan Kelley is a 68 y.o. male with medical history significant of COPD on 3 L oxygen , A-fib/flutter on Eliquis , HTN, HLD, CAD, dCHF, BPH, former smoker, alcohol  abuse, anemia, who presents with SOB and chest pain. Patient was recently hospitalized from 11/29 - 12/3 due to COPD exacerbation, pneumonia and A-fib with RVR. Pt completed her course of doxycycline  and Vantin  after discharge.  In the emergency room patient was found to have A-fib with RVR with rates up to 150s.         Assessment and Plan:  Acute on chronic respiratory failure with hypoxia due to COPD exacerbation:  Patient with baseline 3 to 4 L requirement.  Acute respiratory distress has improved however patient is still wheezing and short of breath with minimal exertion. Plan: Completed steroids Completed doxycycline  Continue scheduled and as needed bronchodilators Add inhaled corticosteroid Add Daliresp  Wean oxygen  as tolerated Goal saturation 88-92% 12/31 Mucinex  600 mg p.o. twice daily, azithromycin  40 mg IV daily x 3 days.    Pleuritic chest pain:  CTA did not show PE Chest pain resolved   Chronic diastolic CHF (congestive heart failure) (HCC):  2D echo on 08/16/2024 showed EF of 60 to 85%. Has trace leg edema, with mildly elevated BNP 952, does not seem to have CHF exacerbation. Plan: Continue oral Lasix  Continue to monitor input and output Cardiology following   Atrial flutter with rapid ventricular response (HCC): Likely triggered by COPD exacerbation. Patient has a history of refractory atrial fibrillation requiring cardioversion Status post accessible DC cardioversion on 12/29 Plan: New oral amiodarone  Continue Eliquis  5 mg twice daily EP follow-up as  outpatient   Essential hypertension Oral Lasix    CAD , nonobstructive on cath 07/28/2024:  Patient has pleuritic chest pain.  Now resolved.  Troponins flat.  Continue statin Continue Eliquis    HLD (hyperlipidemia) Continue Lipitor    Normocytic anemia:  Hemoglobin stable.  Continue to monitor CBC closely   BPH (benign prostatic hyperplasia) - Continue Flomax    Alcohol  abuse:  Monitor for withdrawal, none noted Continue folic acid  and vitamin B-1     Prediabetes with hyperglycemia Patient's last A1c 3 months ago was 5.9 Repeat hemoglobin on 12/26 7.0 Continue insulin  therapy while on steroids Monitor glucose closely DM coordinator engaged  Overweight  Body mass index is 27.51 kg/m.  Interventions: Calorie restricted diet and daily exercise advised to lose body weight.  Lifestyle modification discussed.   Diet: Carb modified diet DVT Prophylaxis: Eliquis   Advance goals of care discussion: Full code  Family Communication: family was not present at bedside, at the time of interview.  The pt provided permission to discuss medical plan with the family. Opportunity was given to ask question and all questions were answered satisfactorily.   Disposition:  Pt is from home, admitted with COPD and atrial flutter, still has COPD, which precludes a safe discharge. Discharge to home, when stable, most likely in 1 to 2 days.  Subjective: No significant events overnight.  Patient still has cough and phlegm production, wheezing and shortness of breath is improving.  Physical Exam: General: NAD, lying comfortably Appear in no distress, affect appropriate Eyes: PERRLA ENT: Oral Mucosa Clear, moist  Neck: no JVD,  Cardiovascular: S1 and S2  Present, no Murmur,  Respiratory: Equal air entry bilaterally, mild crackles, bilateral wheezes.   Abdomen: Bowel Sound present, Soft and no tenderness,  Skin: no rashes Extremities: no Pedal edema, no calf tenderness Neurologic: without any  new focal findings Gait not checked due to patient safety concerns  Vitals:   11/02/24 0344 11/02/24 0452 11/02/24 0719 11/02/24 0740  BP:  129/75  138/82  Pulse:  72  71  Resp:  18  16  Temp:  98 F (36.7 C)  98 F (36.7 C)  TempSrc:    Oral  SpO2:  100% 99% 99%  Weight: 77.3 kg     Height:        Intake/Output Summary (Last 24 hours) at 11/02/2024 1520 Last data filed at 11/02/2024 1300 Gross per 24 hour  Intake 0 ml  Output 400 ml  Net -400 ml   Filed Weights   10/31/24 0500 11/01/24 0449 11/02/24 0344  Weight: 82.3 kg 75.2 kg 77.3 kg    Data Reviewed: I have personally reviewed and interpreted daily labs, tele strips, imagings as discussed above. I reviewed all nursing notes, pharmacy notes, vitals, pertinent old records I have discussed plan of care as described above with RN and patient/family.  CBC: Recent Labs  Lab 10/26/24 2100 10/27/24 0526 10/29/24 0327 10/30/24 0342  WBC 8.2 7.7 16.4* 13.2*  NEUTROABS  --   --  14.5* 10.3*  HGB 10.9* 10.5* 10.6* 11.0*  HCT 34.6* 33.6* 34.3* 35.6*  MCV 88.3 89.8 89.6 90.1  PLT 316 311 357 381   Basic Metabolic Panel: Recent Labs  Lab 10/26/24 2100 10/27/24 0526 10/29/24 0327 10/30/24 0342  NA 137 137 139 141  K 3.7 4.7 4.5 4.0  CL 95* 96* 97* 96*  CO2 33* 32 37* 40*  GLUCOSE 144* 315* 222* 194*  BUN 11 13 24* 22  CREATININE 0.76 0.74 0.86 0.87  CALCIUM  8.7* 8.9 10.0 9.8  MG 1.9  --   --   --   PHOS 2.7  --   --   --     Studies: No results found.  Scheduled Meds:  amiodarone   400 mg Oral BID   Followed by   NOREEN ON 11/04/2024] amiodarone   200 mg Oral BID   Followed by   NOREEN ON 11/12/2024] amiodarone   200 mg Oral Daily   apixaban   5 mg Oral BID   arformoterol   15 mcg Nebulization BID   artificial tears  1 drop Both Eyes BID   atorvastatin   80 mg Oral Daily   benzonatate   200 mg Oral TID   brimonidine   1 drop Left Eye BID   brinzolamide   2 drop Left Eye BID   budesonide  (PULMICORT ) nebulizer  solution  0.25 mg Nebulization BID   dapagliflozin  propanediol  10 mg Oral Daily   diltiazem   180 mg Oral QHS   docusate sodium   100 mg Oral BID   folic acid   1 mg Oral Daily   furosemide   40 mg Oral Daily   guaiFENesin   600 mg Oral BID   insulin  aspart  0-5 Units Subcutaneous QHS   insulin  aspart  0-9 Units Subcutaneous TID WC   ipratropium-albuterol   3 mL Nebulization TID   montelukast   10 mg Oral QHS   pantoprazole   20 mg Oral Daily   polyethylene glycol  17 g Oral Daily   roflumilast   500 mcg Oral Daily   tamsulosin   0.4 mg Oral Daily   thiamine   100 mg Oral  Daily   Continuous Infusions:  azithromycin  500 mg (11/02/24 1359)   PRN Meds: acetaminophen , albuterol , chlorpheniramine-HYDROcodone , diphenhydrAMINE , hydrALAZINE , metoprolol  tartrate, mouth rinse, oxyCODONE -acetaminophen   Time spent: 35 minutes  Author: ELVAN SOR. MD [facility] 11/02/2024 3:20 PM  To reach On-call, see care teams to locate the attending and reach out to them via [email or web address]. If 7PM-7AM, please contact night-coverage If you still have difficulty reaching the attending provider, please page the [facility] (Director on Call) for [facility] on amion for assistance.

## 2024-11-02 NOTE — Inpatient Diabetes Management (Signed)
 Inpatient Diabetes Program Recommendations  AACE/ADA: New Consensus Statement on Inpatient Glycemic Control (2015)  Target Ranges:  Prepandial:   less than 140 mg/dL      Peak postprandial:   less than 180 mg/dL (1-2 hours)      Critically ill patients:  140 - 180 mg/dL    Latest Reference Range & Units 07/29/24 01:33 10/28/24 15:59  Hemoglobin A1C 4.8 - 5.6 % 5.9 (H) 7.0 (H)  154 mg/dl  (H): Data is abnormally high  Latest Reference Range & Units 11/01/24 07:59 11/01/24 12:09 11/01/24 16:47 11/01/24 21:25  Glucose-Capillary 70 - 99 mg/dL 860 (H)  1 unit Novolog   160 (H)  2 units Novolog   263 (H)  5 units Novolog   206 (H)  2 units Novolog    (H): Data is abnormally high   Admit with:  Acute on chronic respiratory failure with hypoxia due to COPD exacerbation Pleuritic chest pain:  Chronic diastolic CHF  Atrial flutter with rapid ventricular response  New Diagnosis Diabetes with A1c of 7%  Current Orders: Farxiga  10 mg daily     Novolog  Sensitive Correction Scale/ SSI (0-9 units) TID AC + HS    Prednisone  Stopped--last dose given yest AM Started Faxiga 10 mg daily 12/29   Met w/ pt today to discuss new diagnosis of diabetes.  Reviewed current A1c of 7% with pt and how this has risen since it was 5.9% in Sept.  Pt stated he doesn't know why his A1c went up.  Was adamant that he does NOT eat any starches and eats a low carb diet (stated to me-- no noodles, no rice, no pasta) and that he eats mostly vegetables and protein.  Did admit to drinking regular ginger ale and Sunny D beverage and stated he will NOT be drinking these drinks anymore.  Does NOT have CBG meter at home--Sister checks his CBGs with her meter.  Currently a pt with [employer].  I reviewed goal CBGs for home with pt and asked pt to check his CBGs either daily or every other day.  Explained what an A1C is, basic pathophysiology of DM Type 2, basic home care, basic  diabetes diet nutrition principles, importance of checking CBGs and maintaining good CBG control to prevent long-term and short-term complications.  Also reviewed with pt that we have started him on Farxiga --explained what Farxiga  is, how it works, side effects.  Asked pt to call PCP if he has any issues with side effects or low CBG at home as pt may need adjustment of the Farxiga .    Have ordered educational booklet--pt stated he started to read the book already.  Have also placed RD consult for DM diet education for this patient.   --Will follow patient during hospitalization--  Adina Rudolpho Arrow RN, MSN, CDCES Diabetes Coordinator Inpatient Glycemic Control Team Team Pager: [phone] (8a-5p)

## 2024-11-02 NOTE — Telephone Encounter (Signed)
-----   Message from Lonni Hanson, MD sent at 11/01/2024  9:27 PM EST ----- Regarding: EP consultation Bryan Kelley is currently hospitalized for recurrent atrial flutter in the setting of COPD exacerbation.  Can you help refer him to EP for consideration of ablation after hospital d/c?  Thanks.  [place]

## 2024-11-02 NOTE — Discharge Instructions (Addendum)

## 2024-11-03 DIAGNOSIS — I483 Typical atrial flutter: Secondary | ICD-10-CM | POA: Diagnosis not present

## 2024-11-03 LAB — BASIC METABOLIC PANEL WITH GFR
Anion gap: 6 (ref 5–15)
BUN: 23 mg/dL (ref 8–23)
CO2: 38 mmol/L — ABNORMAL HIGH (ref 22–32)
Calcium: 9.5 mg/dL (ref 8.9–10.3)
Chloride: 93 mmol/L — ABNORMAL LOW (ref 98–111)
Creatinine, Ser: 0.88 mg/dL (ref 0.61–1.24)
GFR, Estimated: >60 mL/min
Glucose, Bld: 155 mg/dL — ABNORMAL HIGH (ref 70–99)
Potassium: 3.9 mmol/L (ref 3.5–5.1)
Sodium: 138 mmol/L (ref 135–145)

## 2024-11-03 LAB — GLUCOSE, CAPILLARY
Glucose-Capillary: 122 mg/dL — ABNORMAL HIGH (ref 70–99)
Glucose-Capillary: 98 mg/dL (ref 70–99)
Glucose-Capillary: 162 mg/dL — ABNORMAL HIGH (ref 70–99)
Glucose-Capillary: 190 mg/dL — ABNORMAL HIGH (ref 70–99)

## 2024-11-03 LAB — CBC
HCT: 37.7 % — ABNORMAL LOW (ref 39.0–52.0)
Hemoglobin: 12.1 g/dL — ABNORMAL LOW (ref 13.0–17.0)
MCH: 27.7 pg (ref 26.0–34.0)
MCHC: 32.1 g/dL (ref 30.0–36.0)
MCV: 86.3 fL (ref 80.0–100.0)
Platelets: 394 K/uL (ref 150–400)
RBC: 4.37 MIL/uL (ref 4.22–5.81)
RDW: 14.1 % (ref 11.5–15.5)
WBC: 11 K/uL — ABNORMAL HIGH (ref 4.0–10.5)
nRBC: 0 % (ref 0.0–0.2)

## 2024-11-03 LAB — MAGNESIUM: Magnesium: 2 mg/dL (ref 1.7–2.4)

## 2024-11-03 LAB — PHOSPHORUS: Phosphorus: 3.9 mg/dL (ref 2.5–4.6)

## 2024-11-03 MED ORDER — PREDNISONE 20 MG PO TABS
40.0000 mg | ORAL_TABLET | Freq: Every day | ORAL | Status: DC
  Administered 2024-11-03 – 2024-11-04 (×2): 40 mg via ORAL
  Filled 2024-11-03 (×2): qty 2

## 2024-11-03 MED ORDER — PREDNISONE 10 MG PO TABS: 10.0000 mg | ORAL_TABLET | Freq: Every day | ORAL | Status: DC

## 2024-11-03 MED ORDER — PREDNISONE 20 MG PO TABS: 30.0000 mg | ORAL_TABLET | Freq: Every day | ORAL | Status: DC

## 2024-11-03 MED ORDER — MECLIZINE HCL 25 MG PO TABS
25.0000 mg | ORAL_TABLET | Freq: Three times a day (TID) | ORAL | Status: DC | PRN
  Administered 2024-11-03: 25 mg via ORAL
  Filled 2024-11-03 (×2): qty 1

## 2024-11-03 MED ORDER — PREDNISONE 20 MG PO TABS: 20.0000 mg | ORAL_TABLET | Freq: Every day | ORAL | Status: DC

## 2024-11-03 MED ORDER — IPRATROPIUM-ALBUTEROL 0.5-2.5 (3) MG/3ML IN SOLN
3.0000 mL | Freq: Four times a day (QID) | RESPIRATORY_TRACT | Status: DC | PRN
  Administered 2024-11-03 (×2): 3 mL via RESPIRATORY_TRACT
  Filled 2024-11-03: qty 3

## 2024-11-03 NOTE — Plan of Care (Signed)
   Problem: Education: Goal: Knowledge of General Education information will improve Description: Including pain rating scale, medication(s)/side effects and non-pharmacologic comfort measures Outcome: Progressing   Problem: Activity: Goal: Risk for activity intolerance will decrease Outcome: Progressing   Problem: Nutrition: Goal: Adequate nutrition will be maintained Outcome: Progressing   Problem: Coping: Goal: Level of anxiety will decrease Outcome: Progressing

## 2024-11-03 NOTE — Progress Notes (Signed)
 This RRT has gone to this pt's room 3 times to give him his respiratory nebulizers.  Pt has not been available

## 2024-11-03 NOTE — Progress Notes (Signed)
 [facility] Progress Note  Patient: Bryan Kelley    FMW:[id number]  DOA: 10/26/2024     Date of Service: the patient was seen and examined on 11/03/2024  Chief Complaint  Patient presents with   Shortness of Breath   Brief hospital course: Bryan Kelley is a 69 y.o. male with medical history significant of COPD on 3 L oxygen , A-fib/flutter on Eliquis , HTN, HLD, CAD, dCHF, BPH, former smoker, alcohol  abuse, anemia, who presents with SOB and chest pain. Patient was recently hospitalized from 11/29 - 12/3 due to COPD exacerbation, pneumonia and A-fib with RVR. Pt completed her course of doxycycline  and Vantin  after discharge.  In the emergency room patient was found to have A-fib with RVR with rates up to 150s.         Assessment and Plan:  Acute on chronic respiratory failure with hypoxia due to COPD exacerbation:  Patient with baseline 3 to 4 L requirement.  Acute respiratory distress has improved however patient is still wheezing and short of breath with minimal exertion. Plan: Completed steroids Completed doxycycline  Continue scheduled and as needed bronchodilators Add inhaled corticosteroid Add Daliresp  Wean oxygen  as tolerated Goal saturation 88-92% 12/31 Mucinex  600 mg p.o. twice daily, azithromycin  40 mg IV daily x 3 days.   1/1 restarted prednisone  tapering dose    Pleuritic chest pain:  CTA did not show PE Chest pain resolved   Chronic diastolic CHF (congestive heart failure) (HCC):  2D echo on 08/16/2024 showed EF of 60 to 85%. Has trace leg edema, with mildly elevated BNP 952, does not seem to have CHF exacerbation. Plan: Continue oral Lasix  Continue to monitor input and output Cardiology following   Atrial flutter with rapid ventricular response (HCC): Likely triggered by COPD exacerbation. Patient has a history of refractory atrial fibrillation requiring cardioversion Status post accessible DC cardioversion on 12/29 Plan: New oral amiodarone  Continue  Eliquis  5 mg twice daily EP follow-up as outpatient   Essential hypertension Oral Lasix    CAD , nonobstructive on cath 07/28/2024:  Patient has pleuritic chest pain.  Now resolved.  Troponins flat.  Continue statin Continue Eliquis    HLD (hyperlipidemia) Continue Lipitor    Normocytic anemia:  Hemoglobin stable.  Continue to monitor CBC closely   BPH (benign prostatic hyperplasia) - Continue Flomax    Alcohol  abuse:  Monitor for withdrawal, none noted Continue folic acid  and vitamin B-1     Prediabetes with hyperglycemia Patient's last A1c 3 months ago was 5.9 Repeat hemoglobin on 12/26 7.0 Continue insulin  therapy while on steroids Monitor glucose closely DM coordinator engaged  Overweight  Body mass index is 26.94 kg/m.  Interventions: Calorie restricted diet and daily exercise advised to lose body weight.  Lifestyle modification discussed.   Diet: Carb modified diet DVT Prophylaxis: Eliquis   Advance goals of care discussion: Full code  Family Communication: family was not present at bedside, at the time of interview.  The pt provided permission to discuss medical plan with the family. Opportunity was given to ask question and all questions were answered satisfactorily.   Disposition:  Pt is from home, admitted with COPD and atrial flutter, still has COPD, which precludes a safe discharge. Discharge to home, when stable, most likely in 1 to 2 days.  Subjective: No significant events overnight.  Patient still has significant cough, phlegm production and shortness of breath.  Patient was feeling little bit dizzy in the morning.  No any chest pain or palpitations, no any other complaints.  Physical Exam: General:  NAD, lying comfortably Appear in no distress, affect appropriate Eyes: PERRLA ENT: Oral Mucosa Clear, moist  Neck: no JVD,  Cardiovascular: S1 and S2 Present, no Murmur,  Respiratory: Equal air entry bilaterally, mild crackles, significant wheezes  bilaterally, slightly better than yesterday  Abdomen: Bowel Sound present, Soft and no tenderness,  Skin: no rashes Extremities: no Pedal edema, no calf tenderness Neurologic: without any new focal findings Gait not checked due to patient safety concerns  Vitals:   11/03/24 0518 11/03/24 0725 11/03/24 1138 11/03/24 1530  BP: 131/65 123/69 122/75 (!) 109/90  Pulse: 71 80 83 90  Resp: 15 18 18 18   Temp: 98 F (36.7 C) 98.6 F (37 C) 97.7 F (36.5 C)   TempSrc: Oral     SpO2: 98% 95% 97% 99%  Weight:      Height:        Intake/Output Summary (Last 24 hours) at 11/03/2024 1653 Last data filed at 11/03/2024 1502 Gross per 24 hour  Intake 2192.96 ml  Output --  Net 2192.96 ml   Filed Weights   11/01/24 0449 11/02/24 0344 11/03/24 0500  Weight: 75.2 kg 77.3 kg 75.7 kg    Data Reviewed: I have personally reviewed and interpreted daily labs, tele strips, imagings as discussed above. I reviewed all nursing notes, pharmacy notes, vitals, pertinent old records I have discussed plan of care as described above with RN and patient/family.  CBC: Recent Labs  Lab 10/29/24 0327 10/30/24 0342 11/03/24 0948  WBC 16.4* 13.2* 11.0*  NEUTROABS 14.5* 10.3*  --   HGB 10.6* 11.0* 12.1*  HCT 34.3* 35.6* 37.7*  MCV 89.6 90.1 86.3  PLT 357 381 394   Basic Metabolic Panel: Recent Labs  Lab 10/29/24 0327 10/30/24 0342 11/03/24 0948  NA 139 141 138  K 4.5 4.0 3.9  CL 97* 96* 93*  CO2 37* 40* 38*  GLUCOSE 222* 194* 155*  BUN 24* 22 23  CREATININE 0.86 0.87 0.88  CALCIUM  10.0 9.8 9.5  MG  --   --  2.0  PHOS  --   --  3.9    Studies: No results found.  Scheduled Meds:  amiodarone   400 mg Oral BID   Followed by   NOREEN ON 11/04/2024] amiodarone   200 mg Oral BID   Followed by   NOREEN ON 11/12/2024] amiodarone   200 mg Oral Daily   apixaban   5 mg Oral BID   arformoterol   15 mcg Nebulization BID   artificial tears  1 drop Both Eyes BID   atorvastatin   80 mg Oral Daily    benzonatate   200 mg Oral TID   brimonidine   1 drop Left Eye BID   brinzolamide   2 drop Left Eye BID   budesonide  (PULMICORT ) nebulizer solution  0.25 mg Nebulization BID   dapagliflozin  propanediol  10 mg Oral Daily   diltiazem   180 mg Oral QHS   docusate sodium   100 mg Oral BID   folic acid   1 mg Oral Daily   furosemide   40 mg Oral Daily   guaiFENesin   600 mg Oral BID   insulin  aspart  0-5 Units Subcutaneous QHS   insulin  aspart  0-9 Units Subcutaneous TID WC   montelukast   10 mg Oral QHS   pantoprazole   20 mg Oral Daily   polyethylene glycol  17 g Oral Daily   predniSONE   40 mg Oral Q breakfast   Followed by   NOREEN ON 11/06/2024] predniSONE   30 mg Oral Q breakfast  Followed by   NOREEN ON 11/09/2024] predniSONE   20 mg Oral Q breakfast   Followed by   NOREEN ON 11/12/2024] predniSONE   10 mg Oral Q breakfast   roflumilast   500 mcg Oral Daily   tamsulosin   0.4 mg Oral Daily   thiamine   100 mg Oral Daily   Continuous Infusions:  azithromycin  500 mg (11/03/24 1502)   PRN Meds: acetaminophen , chlorpheniramine-HYDROcodone , diphenhydrAMINE , hydrALAZINE , ipratropium-albuterol , meclizine, metoprolol  tartrate, mouth rinse, oxyCODONE -acetaminophen   Time spent: 35 minutes  Author: ELVAN SOR. MD [facility] 11/03/2024 4:53 PM  To reach On-call, see care teams to locate the attending and reach out to them via [email or web address]. If 7PM-7AM, please contact night-coverage If you still have difficulty reaching the attending provider, please page the [facility] (Director on Call) for [facility] on amion for assistance.

## 2024-11-04 ENCOUNTER — Other Ambulatory Visit: Payer: Self-pay

## 2024-11-04 DIAGNOSIS — I483 Typical atrial flutter: Secondary | ICD-10-CM | POA: Diagnosis not present

## 2024-11-04 LAB — GLUCOSE, CAPILLARY
Glucose-Capillary: 118 mg/dL — ABNORMAL HIGH (ref 70–99)
Glucose-Capillary: 153 mg/dL — ABNORMAL HIGH (ref 70–99)

## 2024-11-04 MED ORDER — GUAIFENESIN ER 600 MG PO TB12
600.0000 mg | ORAL_TABLET | Freq: Two times a day (BID) | ORAL | 0 refills | Status: AC
  Filled 2024-11-04: qty 14, 7d supply, fill #0

## 2024-11-04 MED ORDER — FUROSEMIDE 40 MG PO TABS
40.0000 mg | ORAL_TABLET | Freq: Every day | ORAL | 11 refills | Status: AC | PRN
  Filled 2024-11-04: qty 30, 30d supply, fill #0

## 2024-11-04 MED ORDER — MECLIZINE HCL 25 MG PO TABS
25.0000 mg | ORAL_TABLET | Freq: Three times a day (TID) | ORAL | 0 refills | Status: AC | PRN
  Filled 2024-11-04: qty 30, 10d supply, fill #0

## 2024-11-04 MED ORDER — AMIODARONE HCL 200 MG PO TABS
ORAL_TABLET | ORAL | 11 refills | Status: AC
  Filled 2024-11-04: qty 44, 37d supply, fill #0

## 2024-11-04 MED ORDER — PREDNISONE 10 MG PO TABS
ORAL_TABLET | ORAL | 0 refills | Status: AC
  Filled 2024-11-04: qty 22, 10d supply, fill #0

## 2024-11-04 MED ORDER — ALBUTEROL SULFATE HFA 108 (90 BASE) MCG/ACT IN AERS
2.0000 | INHALATION_SPRAY | Freq: Four times a day (QID) | RESPIRATORY_TRACT | 1 refills | Status: AC | PRN
  Filled 2024-11-04: qty 6.7, 25d supply, fill #0

## 2024-11-04 MED ORDER — FLUTICASONE FUROATE-VILANTEROL 200-25 MCG/ACT IN AEPB
1.0000 | INHALATION_SPRAY | Freq: Every day | RESPIRATORY_TRACT | 1 refills | Status: AC
  Filled 2024-11-04: qty 60, 30d supply, fill #0

## 2024-11-04 MED ORDER — ROFLUMILAST 500 MCG PO TABS
500.0000 ug | ORAL_TABLET | Freq: Every day | ORAL | 0 refills | Status: AC
  Filled 2024-11-04: qty 30, 30d supply, fill #0

## 2024-11-04 MED ORDER — DAPAGLIFLOZIN PROPANEDIOL 10 MG PO TABS
10.0000 mg | ORAL_TABLET | Freq: Every day | ORAL | 11 refills | Status: AC
  Filled 2024-11-04: qty 30, 30d supply, fill #0

## 2024-11-04 MED ORDER — DIPHENHYDRAMINE HCL 12.5 MG/5ML PO ELIX: 12.5000 mg | ORAL_SOLUTION | Freq: Three times a day (TID) | ORAL | Status: DC | PRN

## 2024-11-04 NOTE — Progress Notes (Signed)
 Mobility Specialist - Progress Note     11/04/24 1140  Mobility  Activity Stood at bedside;Ambulated independently;Dangled on edge of bed  Level of Assistance Independent after set-up  Assistive Device None  Distance Ambulated (ft) 320 ft  Range of Motion/Exercises Active  Activity Response Tolerated well  Mobility Referral Yes  Mobility visit 1 Mobility  Mobility Specialist Start Time (ACUTE ONLY) 1135   Pt resting in recliner on 3L upon entry. Pt STS and ambulates to hallway around NS for 2 laps Indep with MS holding O2 tank. Pt remained above 89%-90% throughout ambulation. Pt endorses SOB during second lap and audible wheeze heard (RN notified). Pt left with MD present at bedside.   Guido Rumble Mobility Specialist 11/04/2024, 11:48 AM

## 2024-11-04 NOTE — Progress Notes (Signed)
 PHARMACIST - PHYSICIAN COMMUNICATION  DR:   Von CONCERNING: IV to Oral Route Change Policy  RECOMMENDATION: This patient is receiving diphenhydramine  by the intravenous route.  Based on criteria approved by the Pharmacy and Therapeutics Committee, intravenous diphenhydramine  is being converted to the equivalent oral dose form(s).   DESCRIPTION: These criteria include: Diphenhydramine  is not prescribed to treat or prevent a severe allergic reaction Diphenhydramine  is not prescribed as premedication prior to receiving blood product, biologic medication, antimicrobial, or chemotherapy agent The patient has tolerated at least one dose of an oral or enteral medication The patient has no evidence of active gastrointestinal bleeding or impaired GI absorption (gastrectomy, short bowel, patient on TNA or NPO). The patient is not undergoing procedural sedation   If you have questions about this conversion, please contact the Pharmacy Department  []   [phone] )  [facility] [x]   [phone] )  [facility] []   [phone] )  [facility] []   [phone] )  [facility] []   [phone] )  [facility]   Lum Mania, PharmD

## 2024-11-04 NOTE — TOC Progression Note (Signed)
 Transition of Care [facility]) - Progression Note    Patient Details  Name: Bryan Kelley MRN: [id number] Date of Birth: 07-Aug-1956  [facility]) CM/SW Contact  Nathanael CHRISTELLA Ring, RN Phone Number: 11/04/2024, 8:49 AM  Clinical Narrative:     No TOC needs identified at this time.                     Expected Discharge Plan and Services                                               Social Drivers of Health (SDOH) Interventions SDOH Screenings   Food Insecurity: No Food Insecurity (10/27/2024)  Housing: Low Risk (10/27/2024)  Transportation Needs: No Transportation Needs (10/27/2024)  Utilities: Not At Risk (10/27/2024)  Social Connections: Unknown (10/27/2024)  Tobacco Use: Medium Risk (10/26/2024)    Readmission Risk Interventions    10/28/2024   11:01 AM 10/03/2024    4:46 PM  Readmission Risk Prevention Plan  Transportation Screening Complete Complete  PCP or Specialist Appt within 3-5 Days  --  HRI or Home Care Consult  Complete  Social Work Consult for Recovery Care Planning/Counseling  Complete  Palliative Care Screening  Not Applicable  Medication Review [employer]) Complete Complete  PCP or Specialist appointment within 3-5 days of discharge Complete   HRI or Home Care Consult Complete   SW Recovery Care/Counseling Consult Complete   Palliative Care Screening Not Applicable   Skilled Nursing Facility Not Applicable

## 2024-11-04 NOTE — Plan of Care (Signed)
 " Problem: Education: Goal: Knowledge of General Education information will improve Description: Including pain rating scale, medication(s)/side effects and non-pharmacologic comfort measures Outcome: Progressing   Problem: Health Behavior/Discharge Planning: Goal: Ability to manage health-related needs will improve Outcome: Progressing   Problem: Clinical Measurements: Goal: Ability to maintain clinical measurements within normal limits will improve Outcome: Progressing Goal: Will remain free from infection Outcome: Progressing Goal: Diagnostic test results will improve Outcome: Progressing Goal: Respiratory complications will improve Outcome: Progressing Goal: Cardiovascular complication will be avoided Outcome: Progressing   Problem: Activity: Goal: Risk for activity intolerance will decrease Outcome: Progressing   Problem: Nutrition: Goal: Adequate nutrition will be maintained Outcome: Progressing   Problem: Coping: Goal: Level of anxiety will decrease Outcome: Progressing   Problem: Elimination: Goal: Will not experience complications related to bowel motility Outcome: Progressing Goal: Will not experience complications related to urinary retention Outcome: Progressing   Problem: Pain Managment: Goal: General experience of comfort will improve and/or be controlled Outcome: Progressing   Problem: Safety: Goal: Ability to remain free from injury will improve Outcome: Progressing   Problem: Skin Integrity: Goal: Risk for impaired skin integrity will decrease Outcome: Progressing   Problem: Education: Goal: Knowledge of disease or condition will improve Outcome: Progressing Goal: Knowledge of the prescribed therapeutic regimen will improve Outcome: Progressing Goal: Individualized Educational Video(s) Outcome: Progressing   Problem: Activity: Goal: Ability to tolerate increased activity will improve Outcome: Progressing Goal: Will verbalize the  importance of balancing activity with adequate rest periods Outcome: Progressing   Problem: Respiratory: Goal: Ability to maintain a clear airway will improve Outcome: Progressing Goal: Levels of oxygenation will improve Outcome: Progressing Goal: Ability to maintain adequate ventilation will improve Outcome: Progressing   Problem: Education: Goal: Knowledge of disease or condition will improve Outcome: Progressing Goal: Understanding of medication regimen will improve Outcome: Progressing Goal: Individualized Educational Video(s) Outcome: Progressing   Problem: Activity: Goal: Ability to tolerate increased activity will improve Outcome: Progressing   Problem: Cardiac: Goal: Ability to achieve and maintain adequate cardiopulmonary perfusion will improve Outcome: Progressing   Problem: Health Behavior/Discharge Planning: Goal: Ability to safely manage health-related needs after discharge will improve Outcome: Progressing   Problem: Education: Goal: Ability to demonstrate management of disease process will improve Outcome: Progressing Goal: Ability to verbalize understanding of medication therapies will improve Outcome: Progressing Goal: Individualized Educational Video(s) Outcome: Progressing   Problem: Activity: Goal: Capacity to carry out activities will improve Outcome: Progressing   Problem: Cardiac: Goal: Ability to achieve and maintain adequate cardiopulmonary perfusion will improve Outcome: Progressing   Problem: Education: Goal: Ability to describe self-care measures that may prevent or decrease complications (Diabetes Survival Skills Education) will improve Outcome: Progressing Goal: Individualized Educational Video(s) Outcome: Progressing   Problem: Coping: Goal: Ability to adjust to condition or change in health will improve Outcome: Progressing   Problem: Fluid Volume: Goal: Ability to maintain a balanced intake and output will improve Outcome:  Progressing   Problem: Health Behavior/Discharge Planning: Goal: Ability to identify and utilize available resources and services will improve Outcome: Progressing Goal: Ability to manage health-related needs will improve Outcome: Progressing   Problem: Metabolic: Goal: Ability to maintain appropriate glucose levels will improve Outcome: Progressing   Problem: Nutritional: Goal: Maintenance of adequate nutrition will improve Outcome: Progressing Goal: Progress toward achieving an optimal weight will improve Outcome: Progressing   Problem: Skin Integrity: Goal: Risk for impaired skin integrity will decrease Outcome: Progressing   Problem: Tissue Perfusion: Goal: Adequacy of tissue perfusion will improve Outcome:  Progressing   "

## 2024-11-04 NOTE — Progress Notes (Signed)
 Mobility Specialist - Progress Note     11/04/24 1101  Mobility  Activity Stood at bedside;Dangled on edge of bed;Ambulated independently  Level of Assistance Independent  Assistive Device None  Distance Ambulated (ft) 25 ft  Range of Motion/Exercises Active  Activity Response Tolerated well  Mobility Referral Yes  Mobility visit 1 Mobility  Mobility Specialist Start Time (ACUTE ONLY) 1042   Pt resting in bed on 3L upon entry. Pt STS and ambulates to bathroom Indep with no AD. Pt returned to bed and left with needs in reach.    Guido Rumble Mobility Specialist 11/04/2024, 11:08 AM
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
## 2024-11-17 ENCOUNTER — Ambulatory Visit: Admitting: Pulmonary Disease

## 2024-12-20 ENCOUNTER — Ambulatory Visit: Admitting: Cardiovascular Disease

## 2024-12-20 ENCOUNTER — Ambulatory Visit: Admitting: Urology

## 2025-02-28 ENCOUNTER — Ambulatory Visit: Admitting: Cardiology
